# Patient Record
Sex: Female | Born: 1955 | Race: White | Hispanic: No | Marital: Married | Smoking: Former smoker
Health system: Southern US, Community
[De-identification: ages and names within clinical notes are randomized; demographics above are authoritative.]

## PROBLEM LIST (undated history)

## (undated) DIAGNOSIS — J4520 Mild intermittent asthma, uncomplicated: Secondary | ICD-10-CM

## (undated) DIAGNOSIS — Z973 Presence of spectacles and contact lenses: Secondary | ICD-10-CM

## (undated) DIAGNOSIS — E785 Hyperlipidemia, unspecified: Secondary | ICD-10-CM

## (undated) DIAGNOSIS — Z8719 Personal history of other diseases of the digestive system: Secondary | ICD-10-CM

## (undated) DIAGNOSIS — R350 Frequency of micturition: Secondary | ICD-10-CM

## (undated) DIAGNOSIS — M436 Torticollis: Secondary | ICD-10-CM

## (undated) DIAGNOSIS — K5730 Diverticulosis of large intestine without perforation or abscess without bleeding: Secondary | ICD-10-CM

## (undated) DIAGNOSIS — Z8673 Personal history of transient ischemic attack (TIA), and cerebral infarction without residual deficits: Secondary | ICD-10-CM

## (undated) DIAGNOSIS — Z98811 Dental restoration status: Secondary | ICD-10-CM

## (undated) DIAGNOSIS — M2011 Hallux valgus (acquired), right foot: Secondary | ICD-10-CM

## (undated) DIAGNOSIS — N950 Postmenopausal bleeding: Secondary | ICD-10-CM

## (undated) DIAGNOSIS — C50919 Malignant neoplasm of unspecified site of unspecified female breast: Secondary | ICD-10-CM

## (undated) DIAGNOSIS — M1990 Unspecified osteoarthritis, unspecified site: Secondary | ICD-10-CM

## (undated) DIAGNOSIS — R7303 Prediabetes: Secondary | ICD-10-CM

## (undated) DIAGNOSIS — M47812 Spondylosis without myelopathy or radiculopathy, cervical region: Secondary | ICD-10-CM

## (undated) DIAGNOSIS — R9389 Abnormal findings on diagnostic imaging of other specified body structures: Secondary | ICD-10-CM

## (undated) DIAGNOSIS — Z87898 Personal history of other specified conditions: Secondary | ICD-10-CM

## (undated) DIAGNOSIS — R0609 Other forms of dyspnea: Secondary | ICD-10-CM

## (undated) DIAGNOSIS — Z923 Personal history of irradiation: Secondary | ICD-10-CM

## (undated) DIAGNOSIS — Z8616 Personal history of COVID-19: Secondary | ICD-10-CM

## (undated) DIAGNOSIS — M2560 Stiffness of unspecified joint, not elsewhere classified: Secondary | ICD-10-CM

## (undated) DIAGNOSIS — J45909 Unspecified asthma, uncomplicated: Secondary | ICD-10-CM

## (undated) DIAGNOSIS — I10 Essential (primary) hypertension: Secondary | ICD-10-CM

## (undated) DIAGNOSIS — N882 Stricture and stenosis of cervix uteri: Secondary | ICD-10-CM

## (undated) DIAGNOSIS — R739 Hyperglycemia, unspecified: Secondary | ICD-10-CM

## (undated) DIAGNOSIS — M26629 Arthralgia of temporomandibular joint, unspecified side: Secondary | ICD-10-CM

## (undated) DIAGNOSIS — T7840XA Allergy, unspecified, initial encounter: Secondary | ICD-10-CM

## (undated) DIAGNOSIS — K760 Fatty (change of) liver, not elsewhere classified: Secondary | ICD-10-CM

## (undated) DIAGNOSIS — R911 Solitary pulmonary nodule: Secondary | ICD-10-CM

## (undated) DIAGNOSIS — R1013 Epigastric pain: Secondary | ICD-10-CM

## (undated) DIAGNOSIS — N393 Stress incontinence (female) (male): Secondary | ICD-10-CM

## (undated) HISTORY — DX: Malignant neoplasm of unspecified site of unspecified female breast: C50.919

## (undated) HISTORY — DX: Mild intermittent asthma, uncomplicated: J45.20

## (undated) HISTORY — PX: GANGLION CYST EXCISION: SHX1691

## (undated) HISTORY — DX: Morbid (severe) obesity due to excess calories: E66.01

## (undated) HISTORY — DX: Personal history of transient ischemic attack (TIA), and cerebral infarction without residual deficits: Z86.73

## (undated) HISTORY — DX: Allergy, unspecified, initial encounter: T78.40XA

## (undated) HISTORY — DX: Hyperglycemia, unspecified: R73.9

## (undated) HISTORY — DX: Epigastric pain: R10.13

## (undated) HISTORY — DX: Hyperlipidemia, unspecified: E78.5

## (undated) HISTORY — DX: Essential (primary) hypertension: I10

---
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
## 1977-08-05 HISTORY — PX: APPENDECTOMY: SHX54

## 1979-08-06 HISTORY — PX: NASAL SEPTUM SURGERY: SHX37

## 1986-08-05 HISTORY — PX: BREAST ENHANCEMENT SURGERY: SHX7

## 1997-08-05 HISTORY — PX: TUBAL LIGATION: SHX77

## 1998-09-08 ENCOUNTER — Other Ambulatory Visit: Admission: RE | Admit: 1998-09-08 | Discharge: 1998-09-08 | Payer: Self-pay | Admitting: *Deleted

## 1999-06-15 ENCOUNTER — Ambulatory Visit (HOSPITAL_COMMUNITY): Admission: RE | Admit: 1999-06-15 | Discharge: 1999-06-15 | Payer: Self-pay | Admitting: *Deleted

## 1999-07-06 ENCOUNTER — Other Ambulatory Visit: Admission: RE | Admit: 1999-07-06 | Discharge: 1999-07-06 | Payer: Self-pay | Admitting: Orthopedic Surgery

## 1999-07-15 ENCOUNTER — Inpatient Hospital Stay (HOSPITAL_COMMUNITY): Admission: AD | Admit: 1999-07-15 | Discharge: 1999-07-15 | Payer: Self-pay | Admitting: Obstetrics & Gynecology

## 2001-12-01 ENCOUNTER — Ambulatory Visit (HOSPITAL_COMMUNITY): Admission: RE | Admit: 2001-12-01 | Discharge: 2001-12-01 | Payer: Self-pay | Admitting: Family Medicine

## 2001-12-01 ENCOUNTER — Encounter: Payer: Self-pay | Admitting: Family Medicine

## 2002-08-03 ENCOUNTER — Encounter: Admission: RE | Admit: 2002-08-03 | Discharge: 2002-08-03 | Payer: Self-pay | Admitting: *Deleted

## 2002-08-04 ENCOUNTER — Other Ambulatory Visit: Admission: RE | Admit: 2002-08-04 | Discharge: 2002-08-04 | Payer: Self-pay | Admitting: *Deleted

## 2002-08-05 DIAGNOSIS — Z8673 Personal history of transient ischemic attack (TIA), and cerebral infarction without residual deficits: Secondary | ICD-10-CM

## 2002-08-05 HISTORY — DX: Personal history of transient ischemic attack (TIA), and cerebral infarction without residual deficits: Z86.73

## 2003-06-21 ENCOUNTER — Encounter (INDEPENDENT_AMBULATORY_CARE_PROVIDER_SITE_OTHER): Payer: Self-pay | Admitting: Cardiology

## 2003-06-21 ENCOUNTER — Observation Stay (HOSPITAL_COMMUNITY): Admission: AD | Admit: 2003-06-21 | Discharge: 2003-06-22 | Payer: Self-pay | Admitting: Family Medicine

## 2003-08-01 ENCOUNTER — Encounter: Admission: RE | Admit: 2003-08-01 | Discharge: 2003-08-01 | Payer: Self-pay | Admitting: *Deleted

## 2003-08-25 ENCOUNTER — Encounter: Admission: RE | Admit: 2003-08-25 | Discharge: 2003-08-25 | Payer: Self-pay | Admitting: Family Medicine

## 2003-10-21 ENCOUNTER — Ambulatory Visit (HOSPITAL_COMMUNITY): Admission: RE | Admit: 2003-10-21 | Discharge: 2003-10-21 | Payer: Self-pay | Admitting: Critical Care Medicine

## 2004-06-26 ENCOUNTER — Ambulatory Visit: Payer: Self-pay | Admitting: Family Medicine

## 2004-07-09 ENCOUNTER — Ambulatory Visit: Payer: Self-pay | Admitting: Family Medicine

## 2004-07-13 ENCOUNTER — Ambulatory Visit: Payer: Self-pay

## 2004-07-20 ENCOUNTER — Ambulatory Visit: Payer: Self-pay | Admitting: Family Medicine

## 2004-08-08 ENCOUNTER — Ambulatory Visit: Payer: Self-pay

## 2004-08-15 ENCOUNTER — Ambulatory Visit: Payer: Self-pay | Admitting: Family Medicine

## 2004-08-28 ENCOUNTER — Ambulatory Visit: Payer: Self-pay | Admitting: Family Medicine

## 2004-11-02 ENCOUNTER — Ambulatory Visit: Payer: Self-pay | Admitting: Family Medicine

## 2005-12-16 ENCOUNTER — Ambulatory Visit: Payer: Self-pay | Admitting: Family Medicine

## 2005-12-20 ENCOUNTER — Encounter: Admission: RE | Admit: 2005-12-20 | Discharge: 2005-12-20 | Payer: Self-pay | Admitting: Family Medicine

## 2006-04-09 ENCOUNTER — Ambulatory Visit: Payer: Self-pay | Admitting: Internal Medicine

## 2006-07-30 ENCOUNTER — Ambulatory Visit: Payer: Self-pay | Admitting: Internal Medicine

## 2006-12-14 ENCOUNTER — Encounter: Admission: RE | Admit: 2006-12-14 | Discharge: 2006-12-14 | Payer: Self-pay | Admitting: Radiology

## 2007-07-17 ENCOUNTER — Ambulatory Visit (HOSPITAL_BASED_OUTPATIENT_CLINIC_OR_DEPARTMENT_OTHER): Admission: RE | Admit: 2007-07-17 | Discharge: 2007-07-17 | Payer: Self-pay | Admitting: Orthopedic Surgery

## 2007-07-17 HISTORY — PX: BUNIONECTOMY: SHX129

## 2007-09-24 ENCOUNTER — Encounter: Payer: Self-pay | Admitting: Internal Medicine

## 2007-10-21 ENCOUNTER — Ambulatory Visit (HOSPITAL_COMMUNITY): Admission: RE | Admit: 2007-10-21 | Discharge: 2007-10-21 | Payer: Self-pay | Admitting: Urology

## 2007-10-21 ENCOUNTER — Encounter (INDEPENDENT_AMBULATORY_CARE_PROVIDER_SITE_OTHER): Payer: Self-pay | Admitting: Plastic Surgery

## 2007-10-21 ENCOUNTER — Ambulatory Visit: Payer: Self-pay | Admitting: Surgery

## 2007-11-26 ENCOUNTER — Ambulatory Visit: Payer: Self-pay | Admitting: Internal Medicine

## 2007-11-26 DIAGNOSIS — Z978 Presence of other specified devices: Secondary | ICD-10-CM | POA: Insufficient documentation

## 2007-11-26 DIAGNOSIS — R739 Hyperglycemia, unspecified: Secondary | ICD-10-CM | POA: Insufficient documentation

## 2007-11-26 DIAGNOSIS — J4520 Mild intermittent asthma, uncomplicated: Secondary | ICD-10-CM

## 2007-11-26 DIAGNOSIS — F411 Generalized anxiety disorder: Secondary | ICD-10-CM | POA: Insufficient documentation

## 2007-11-26 HISTORY — DX: Hyperglycemia, unspecified: R73.9

## 2007-11-26 HISTORY — DX: Mild intermittent asthma, uncomplicated: J45.20

## 2007-11-27 ENCOUNTER — Encounter (INDEPENDENT_AMBULATORY_CARE_PROVIDER_SITE_OTHER): Payer: Self-pay | Admitting: *Deleted

## 2007-12-04 LAB — CONVERTED CEMR LAB
AST: 16 units/L (ref 0–37)
Albumin: 4 g/dL (ref 3.5–5.2)
Alkaline Phosphatase: 80 units/L (ref 39–117)
BUN: 20 mg/dL (ref 6–23)
Basophils Relative: 0.1 % (ref 0.0–1.0)
Chloride: 108 meq/L (ref 96–112)
Eosinophils Relative: 3.3 % (ref 0.0–5.0)
Glucose, Bld: 98 mg/dL (ref 70–99)
HCT: 39.9 % (ref 36.0–46.0)
Monocytes Absolute: 0.4 10*3/uL (ref 0.1–1.0)
Monocytes Relative: 5.8 % (ref 3.0–12.0)
Platelets: 246 10*3/uL (ref 150–400)
Potassium: 4 meq/L (ref 3.5–5.1)
RBC: 4.47 M/uL (ref 3.87–5.11)
Total CHOL/HDL Ratio: 6.9
Total Protein: 7 g/dL (ref 6.0–8.3)
Triglycerides: 133 mg/dL (ref 0–149)
WBC: 6.1 10*3/uL (ref 4.5–10.5)

## 2007-12-23 ENCOUNTER — Ambulatory Visit: Payer: Self-pay | Admitting: Critical Care Medicine

## 2007-12-23 ENCOUNTER — Encounter: Payer: Self-pay | Admitting: Internal Medicine

## 2007-12-29 ENCOUNTER — Ambulatory Visit: Payer: Self-pay | Admitting: Internal Medicine

## 2007-12-29 ENCOUNTER — Encounter (INDEPENDENT_AMBULATORY_CARE_PROVIDER_SITE_OTHER): Payer: Self-pay | Admitting: *Deleted

## 2007-12-29 DIAGNOSIS — E785 Hyperlipidemia, unspecified: Secondary | ICD-10-CM | POA: Insufficient documentation

## 2007-12-30 ENCOUNTER — Encounter (INDEPENDENT_AMBULATORY_CARE_PROVIDER_SITE_OTHER): Payer: Self-pay | Admitting: *Deleted

## 2008-02-02 ENCOUNTER — Encounter: Payer: Self-pay | Admitting: Internal Medicine

## 2008-02-02 ENCOUNTER — Encounter: Admission: RE | Admit: 2008-02-02 | Discharge: 2008-04-26 | Payer: Self-pay | Admitting: Internal Medicine

## 2008-03-05 LAB — CONVERTED CEMR LAB

## 2008-06-07 ENCOUNTER — Ambulatory Visit (HOSPITAL_BASED_OUTPATIENT_CLINIC_OR_DEPARTMENT_OTHER): Admission: RE | Admit: 2008-06-07 | Discharge: 2008-06-07 | Payer: Self-pay | Admitting: Orthopedic Surgery

## 2008-06-07 HISTORY — PX: SHOULDER ARTHROSCOPY: SHX128

## 2008-06-07 HISTORY — PX: SHOULDER ARTHROSCOPY W/ LABRAL REPAIR: SHX2399

## 2008-06-16 ENCOUNTER — Ambulatory Visit: Payer: Self-pay | Admitting: Internal Medicine

## 2008-06-16 DIAGNOSIS — I10 Essential (primary) hypertension: Secondary | ICD-10-CM | POA: Insufficient documentation

## 2008-06-16 HISTORY — DX: Essential (primary) hypertension: I10

## 2008-06-16 LAB — CONVERTED CEMR LAB
AST: 17 units/L (ref 0–37)
Basophils Absolute: 0 10*3/uL (ref 0.0–0.1)
Bilirubin Urine: NEGATIVE
Calcium: 10.1 mg/dL (ref 8.4–10.5)
Chloride: 101 meq/L (ref 96–112)
Cholesterol: 282 mg/dL (ref 0–200)
Creatinine, Ser: 0.9 mg/dL (ref 0.4–1.2)
Crystals: NEGATIVE
Direct LDL: 194.3 mg/dL
GFR calc Af Amer: 85 mL/min
GFR calc non Af Amer: 70 mL/min
HDL: 39.3 mg/dL (ref >39.0)
Hgb A1c MFr Bld: 5.7 % (ref 4.6–6.0)
Leukocytes, UA: NEGATIVE
Lymphocytes Relative: 20.4 % (ref 12.0–46.0)
MCHC: 34.6 g/dL (ref 30.0–36.0)
Mucus, UA: NEGATIVE
Neutro Abs: 5.2 10*3/uL (ref 1.4–7.7)
Neutrophils Relative %: 72.5 % (ref 43.0–77.0)
Nitrite: NEGATIVE
RDW: 12.6 % (ref 11.5–14.6)
Specific Gravity, Urine: <=1.005 (ref 1.000–1.03)
TSH: 2.26 microintl units/mL (ref 0.35–5.50)
Total Bilirubin: 0.9 mg/dL (ref 0.3–1.2)
Triglycerides: 151 mg/dL — ABNORMAL HIGH (ref 0–149)
VLDL: 30 mg/dL (ref 0–40)
pH: 5.5 (ref 5.0–8.0)

## 2008-06-23 ENCOUNTER — Ambulatory Visit: Payer: Self-pay | Admitting: Internal Medicine

## 2008-06-23 DIAGNOSIS — E8881 Metabolic syndrome: Secondary | ICD-10-CM | POA: Insufficient documentation

## 2008-06-23 LAB — CONVERTED CEMR LAB
Cholesterol, target level: 200 mg/dL
LDL Goal: 70 mg/dL

## 2008-07-20 ENCOUNTER — Ambulatory Visit: Payer: Self-pay | Admitting: Internal Medicine

## 2008-08-01 ENCOUNTER — Ambulatory Visit: Payer: Self-pay | Admitting: Internal Medicine

## 2008-08-01 ENCOUNTER — Telehealth (INDEPENDENT_AMBULATORY_CARE_PROVIDER_SITE_OTHER): Payer: Self-pay | Admitting: *Deleted

## 2008-08-14 ENCOUNTER — Telehealth: Payer: Self-pay | Admitting: Family Medicine

## 2008-08-15 ENCOUNTER — Telehealth: Payer: Self-pay | Admitting: Internal Medicine

## 2008-08-18 ENCOUNTER — Other Ambulatory Visit: Payer: Self-pay | Admitting: Orthopedic Surgery

## 2008-08-18 HISTORY — PX: ACROMIONECTOMY: SHX1124

## 2008-08-18 HISTORY — PX: ROTATOR CUFF REPAIR W/ DISTAL CLAVICLE EXCISION: SHX2365

## 2008-08-19 ENCOUNTER — Inpatient Hospital Stay (HOSPITAL_COMMUNITY): Admission: AD | Admit: 2008-08-19 | Discharge: 2008-08-20 | Payer: Self-pay | Admitting: Orthopedic Surgery

## 2008-10-18 ENCOUNTER — Ambulatory Visit: Payer: Self-pay | Admitting: Internal Medicine

## 2008-11-16 ENCOUNTER — Ambulatory Visit: Payer: Self-pay | Admitting: Internal Medicine

## 2009-06-14 ENCOUNTER — Ambulatory Visit: Payer: Self-pay | Admitting: Internal Medicine

## 2009-06-14 DIAGNOSIS — K219 Gastro-esophageal reflux disease without esophagitis: Secondary | ICD-10-CM | POA: Insufficient documentation

## 2009-06-14 LAB — CONVERTED CEMR LAB
ALT: 34 units/L (ref 0–35)
AST: 25 units/L (ref 0–37)
BUN: 15 mg/dL (ref 6–23)
Basophils Absolute: 0.1 10*3/uL (ref 0.0–0.1)
Bilirubin, Direct: 0.1 mg/dL (ref 0.0–0.3)
CK-MB: 1.1 ng/mL (ref 0.3–4.0)
Creatinine, Ser: 0.7 mg/dL (ref 0.4–1.2)
Eosinophils Relative: 2.6 % (ref 0.0–5.0)
GFR calc non Af Amer: 93.73 mL/min (ref >60)
Hemoglobin, Urine: NEGATIVE
LH: 25.49 milliintl units/mL
Monocytes Absolute: 0.4 10*3/uL (ref 0.1–1.0)
Monocytes Relative: 6.4 % (ref 3.0–12.0)
Neutrophils Relative %: 64.9 % (ref 43.0–77.0)
Nitrite: NEGATIVE
Platelets: 203 10*3/uL (ref 150.0–400.0)
RDW: 12.8 % (ref 11.5–14.6)
Specific Gravity, Urine: <=1.005 (ref 1.000–1.030)
Total Bilirubin: 0.7 mg/dL (ref 0.3–1.2)
Urine Glucose: NEGATIVE mg/dL
Urobilinogen, UA: 0.2 (ref 0.0–1.0)
WBC: 5.9 10*3/uL (ref 4.5–10.5)

## 2009-06-15 ENCOUNTER — Encounter: Payer: Self-pay | Admitting: Internal Medicine

## 2009-07-04 ENCOUNTER — Ambulatory Visit: Payer: Self-pay

## 2009-07-04 ENCOUNTER — Ambulatory Visit: Payer: Self-pay | Admitting: Cardiology

## 2010-01-23 ENCOUNTER — Encounter: Payer: Self-pay | Admitting: Internal Medicine

## 2010-04-30 ENCOUNTER — Ambulatory Visit: Payer: Self-pay | Admitting: Internal Medicine

## 2010-04-30 DIAGNOSIS — R1084 Generalized abdominal pain: Secondary | ICD-10-CM | POA: Insufficient documentation

## 2010-04-30 DIAGNOSIS — A09 Infectious gastroenteritis and colitis, unspecified: Secondary | ICD-10-CM | POA: Insufficient documentation

## 2010-04-30 LAB — CONVERTED CEMR LAB
ALT: 32 units/L (ref 0–35)
AST: 25 units/L (ref 0–37)
Alkaline Phosphatase: 88 units/L (ref 39–117)
BUN: 17 mg/dL (ref 6–23)
Basophils Absolute: 0.1 10*3/uL (ref 0.0–0.1)
Bilirubin, Direct: 0.1 mg/dL (ref 0.0–0.3)
Calcium: 9.7 mg/dL (ref 8.4–10.5)
Creatinine, Ser: 0.8 mg/dL (ref 0.4–1.2)
Eosinophils Relative: 4.3 % (ref 0.0–5.0)
GFR calc non Af Amer: 78.93 mL/min (ref >60)
Glucose, Bld: 95 mg/dL (ref 70–99)
HCT: 37.8 % (ref 36.0–46.0)
Lymphocytes Relative: 24.2 % (ref 12.0–46.0)
Lymphs Abs: 1.6 10*3/uL (ref 0.7–4.0)
Monocytes Relative: 6.2 % (ref 3.0–12.0)
Neutrophils Relative %: 64.2 % (ref 43.0–77.0)
Nitrite: NEGATIVE
Platelets: 228 10*3/uL (ref 150.0–400.0)
Potassium: 3.9 meq/L (ref 3.5–5.1)
RDW: 14 % (ref 11.5–14.6)
Specific Gravity, Urine: >=1.03 (ref 1.000–1.030)
TSH: 1.42 microintl units/mL (ref 0.35–5.50)
Total Bilirubin: 0.4 mg/dL (ref 0.3–1.2)
Total Protein, Urine: NEGATIVE mg/dL
Urine Glucose: NEGATIVE mg/dL
Urobilinogen, UA: 0.2 (ref 0.0–1.0)
WBC: 6.4 10*3/uL (ref 4.5–10.5)

## 2010-08-25 ENCOUNTER — Encounter: Payer: Self-pay | Admitting: Family Medicine

## 2010-08-26 ENCOUNTER — Encounter: Payer: Self-pay | Admitting: Obstetrics and Gynecology

## 2010-09-04 NOTE — Assessment & Plan Note (Signed)
Summary: X 2 WKS OFF AND ON-L SHOULDER & BACK PAIN-NAUSEA-VOMITING-STC   Vital Signs:  Patient profile:   55 year old female Height:      63 inches Weight:      237 pounds BMI:     42.13 O2 Sat:      97 % on Room air Temp:     98.2 degrees F oral Pulse rate:   84 / minute Pulse rhythm:   regular BP sitting:   120 / 80  (left arm) Cuff size:   large  Vitals Entered By: Rock Nephew CMA (April 30, 2010 2:02 PM)  O2 Flow:  Room air CC: Pt c/lo nausea, vomiting, diarrhea, abdominal pain x 2wks, Diarrhea Is Patient Diabetic? Yes Did you bring your meter with you today? No Pain Assessment Patient in pain? yes     Location: abdomen  Does patient need assistance? Functional Status Self care Ambulation Normal   Primary Care Provider:  Etta Grandchild MD  CC:  Pt c/lo nausea, vomiting, diarrhea, abdominal pain x 2wks, and Diarrhea.  History of Present Illness:  Diarrhea      This is a 55 year old woman who presents with Diarrhea.  The symptoms began 2 weeks ago.  The severity is described as moderate.  The patient reports 4-6 stools per day, watery/unformed stools, fasting diarrhea, and abrupt onset of symptoms, but denies voluminous stools, blood in stool, mucus in stool, greasy stools, malodorous stools, fecal urgency, fecal soiling, alternating diarrhea/constipation, nocturnal diarrhea, bloating, gassiness, and gradual onset of symptoms.  Associated symptoms include abdominal pain, abdominal cramps, nausea, and vomiting.  The patient denies fever, lightheadedness, increased thirst, weight loss, joint pains, mouth ulcers, and eye redness.  The symptoms are worse with any food.  The symptoms are better with hypomotility agents.  Patient's risk factors for diarrhea include sick contact.    Preventive Screening-Counseling & Management  Alcohol-Tobacco     Alcohol drinks/day: 0     Alcohol Counseling: not indicated; patient does not drink     Smoking Status: quit > 6 months     Year Quit: 07/2004     Passive Smoke Exposure: no     Tobacco Counseling: to remain off tobacco products  Hep-HIV-STD-Contraception     Hepatitis Risk: no risk noted     HIV Risk: no risk noted     STD Risk: no risk noted      Sexual History:  currently monogamous.        Drug Use:  never.        Blood Transfusions:  no.    Medications Prior to Update: 1)  Zetia 10 Mg Tabs (Ezetimibe) .... Once Daily Prn 2)  Aciphex 20 Mg Tbec (Rabeprazole Sodium) .... Once Daily For Heartburn  Current Medications (verified): 1)  Aciphex 20 Mg Tbec (Rabeprazole Sodium) .... Once Daily For Heartburn 2)  Metronidazole 500 Mg Tabs (Metronidazole) .... One By Mouth Three Times A Day For 10 Days  Allergies (verified): 1)  ! Pcn 2)  ! Phenergan 3)  ! Levaquin 4)  ! Biaxin 5)  ! 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 echo.. 2004 Chest pain.... Myoview 2006.. no ischemia  /   standard treadmill.. July 04, 2009... limited tolerance... no ischemia  Past Surgical History: Last updated: 06/27/2009 Carpal tunnel release R Tubal ligation breast implants Rhinoplasty C-section  Appendectomy  Family History: Last updated: 11/26/2007 lung Ca - M DM - M, bro CAD - M, PGF (MI age 40)  Social History: Last updated: 06/16/2008 Married 1 child Never Smoked Alcohol use-no Drug use-no Regular exercise-yes  Risk Factors: Alcohol Use: 0 (04/30/2010) Exercise: yes (06/16/2008)  Risk Factors: Smoking Status: quit > 6 months (04/30/2010) Passive Smoke Exposure: no (04/30/2010)  Family History: Reviewed history from 11/26/2007 and no changes required. lung Ca - M DM - M, bro CAD - M, PGF (MI age 36)  Social History: Reviewed history from 06/16/2008 and no changes required. Married 1 child Never Smoked Alcohol  use-no Drug use-no Regular exercise-yes Smoking Status:  quit > 6 months  Review of Systems       The patient complains of anorexia and abdominal pain.  The patient denies fever, weight loss, weight gain, chest pain, syncope, dyspnea on exertion, peripheral edema, prolonged cough, headaches, hemoptysis, melena, hematochezia, severe indigestion/heartburn, hematuria, suspicious skin lesions, enlarged lymph nodes, and angioedema.   GI:  Complains of abdominal pain, change in bowel habits, diarrhea, loss of appetite, nausea, and vomiting; denies bloody stools, constipation, dark tarry stools, hemorrhoids, indigestion, vomiting blood, and yellowish skin color.  Physical Exam  General:  alert, well-developed, well-nourished, well-hydrated, appropriate dress, normal appearance, cooperative to examination, good hygiene, and overweight-appearing.   Head:  normocephalic and atraumatic.   Eyes:  no icterus Mouth:  Oral mucosa and oropharynx without lesions or exudates.  Teeth in good repair. Neck:  supple, full ROM, no masses, no thyromegaly, no JVD, no carotid bruits, no cervical lymphadenopathy, and no neck tenderness.   Lungs:  normal respiratory effort, no intercostal retractions, no accessory muscle use, normal breath sounds, and no dullness.   Heart:  normal rate, regular rhythm, no murmur, no gallop, and no rub.   Abdomen:  soft, non-tender, normal bowel sounds, no distention, no masses, no guarding, no rigidity, no rebound tenderness, no abdominal hernia, no inguinal hernia, no hepatomegaly, and no splenomegaly.   Msk:  No deformity or scoliosis noted of thoracic or lumbar spine.   Pulses:  R and L carotid,radial,femoral,dorsalis pedis and posterior tibial pulses are full and equal bilaterally Extremities:  No clubbing, cyanosis, edema, or deformity noted with normal full range of motion of all joints.   Neurologic:  No cranial nerve deficits noted. Station and gait are normal. Plantar reflexes  are down-going bilaterally. DTRs are symmetrical throughout. Sensory, motor and coordinative functions appear intact. Skin:  turgor normal, color normal, no rashes, no suspicious lesions, no ecchymoses, no petechiae, no purpura, no ulcerations, and no edema.   Cervical Nodes:  no anterior cervical adenopathy and no posterior cervical adenopathy.   Axillary Nodes:  no R axillary adenopathy and no L axillary adenopathy.   Psych:  Cognition and judgment appear intact. Alert and cooperative with normal attention span and concentration. No apparent delusions, illusions, hallucinations  Diabetes Management Exam:    Foot Exam (with socks and/or shoes not present):       Sensory-Pinprick/Light touch:          Left medial foot (L-4): normal          Left dorsal foot (L-5): normal          Left lateral foot (S-1): normal          Right medial foot (L-4): normal          Right dorsal foot (L-5): normal          Right lateral  foot (S-1): normal       Sensory-Monofilament:          Left foot: normal          Right foot: normal       Inspection:          Left foot: normal          Right foot: normal       Nails:          Left foot: normal          Right foot: normal   Impression & Recommendations:  Problem # 1:  INFECTIOUS DIARRHEA (ICD-009.2) Assessment New start flagyl, this sounds like C diff colitis Orders: Venipuncture (16109) TLB-BMP (Basic Metabolic Panel-BMET) (80048-METABOL) TLB-CBC Platelet - w/Differential (85025-CBCD) TLB-Hepatic/Liver Function Pnl (80076-HEPATIC) TLB-TSH (Thyroid Stimulating Hormone) (84443-TSH) TLB-Amylase (82150-AMYL) TLB-Lipase (83690-LIPASE) TLB-Udip w/ Micro (81001-URINE) TLB-CRP-High Sensitivity (C-Reactive Protein) (86140-FCRP) TLB-A1C / Hgb A1C (Glycohemoglobin) (83036-A1C) T-Culture, Stool (87045/87046-70140) T-Culture, C-Diff Toxin A/B (60454-09811) T-Stool Giardia / Crypto- EIA (91478) T-Stool for O&P (29562-13086)  Problem # 2:  ABDOMINAL  PAIN, GENERALIZED (ICD-789.07) Assessment: New  Orders: Venipuncture (57846) TLB-BMP (Basic Metabolic Panel-BMET) (80048-METABOL) TLB-CBC Platelet - w/Differential (85025-CBCD) TLB-Hepatic/Liver Function Pnl (80076-HEPATIC) TLB-TSH (Thyroid Stimulating Hormone) (84443-TSH) TLB-Amylase (82150-AMYL) TLB-Lipase (83690-LIPASE) TLB-Udip w/ Micro (81001-URINE) TLB-CRP-High Sensitivity (C-Reactive Protein) (86140-FCRP) TLB-A1C / Hgb A1C (Glycohemoglobin) (83036-A1C)  Problem # 3:  HYPERTENSION (ICD-401.9) Assessment: Improved  Orders: Venipuncture (96295) TLB-BMP (Basic Metabolic Panel-BMET) (80048-METABOL) TLB-CBC Platelet - w/Differential (85025-CBCD) TLB-Hepatic/Liver Function Pnl (80076-HEPATIC) TLB-TSH (Thyroid Stimulating Hormone) (84443-TSH) TLB-Amylase (82150-AMYL) TLB-Lipase (83690-LIPASE) TLB-Udip w/ Micro (81001-URINE) TLB-CRP-High Sensitivity (C-Reactive Protein) (86140-FCRP) TLB-A1C / Hgb A1C (Glycohemoglobin) (83036-A1C)  BP today: 120/80 Prior BP: 118/86 (07/04/2009)  Prior 10 Yr Risk Heart Disease: Not enough information (10/18/2008)  Labs Reviewed: K+: 4.1 (06/14/2009) Creat: : 0.7 (06/14/2009)   Chol: 282 (06/16/2008)   HDL: 39.3 (06/16/2008)   LDL: DEL (06/16/2008)   TG: 151 (06/16/2008)  Problem # 4:  DIABETES MELLITUS, TYPE II (ICD-250.00) Assessment: Unchanged  Orders: Venipuncture (28413) TLB-BMP (Basic Metabolic Panel-BMET) (80048-METABOL) TLB-CBC Platelet - w/Differential (85025-CBCD) TLB-Hepatic/Liver Function Pnl (80076-HEPATIC) TLB-TSH (Thyroid Stimulating Hormone) (84443-TSH) TLB-Amylase (82150-AMYL) TLB-Lipase (83690-LIPASE) TLB-Udip w/ Micro (81001-URINE) TLB-CRP-High Sensitivity (C-Reactive Protein) (86140-FCRP) TLB-A1C / Hgb A1C (Glycohemoglobin) (83036-A1C)  Labs Reviewed: Creat: 0.7 (06/14/2009)     Last Eye Exam: normal (04/11/2008) Reviewed HgBA1c results: 5.8 (06/14/2009)  5.7 (06/16/2008)  Complete Medication List: 1)   Aciphex 20 Mg Tbec (Rabeprazole sodium) .... Once daily for heartburn 2)  Metronidazole 500 Mg Tabs (Metronidazole) .... One by mouth three times a day for 10 days  Patient Instructions: 1)  Please schedule a follow-up appointment in 2 weeks. 2)  Take your antibiotic as prescribed until ALL of it is gone, but stop if you develop a rash or swelling and contact our office as soon as possible. 3)  teh main problem with gastroenteritis is dehydration. Drink plenty of fluids and take solids as you feel better. If you are unable to keep anything down and/or you show signs of dehydration(dry/cracked lips, lack of tears, not urinating, very sleepy), call our office. Prescriptions: METRONIDAZOLE 500 MG TABS (METRONIDAZOLE) One by mouth three times a day for 10 days  #30 x 0   Entered and Authorized by:   Etta Grandchild MD   Signed by:   Etta Grandchild MD on 04/30/2010   Method used:   Electronically to        [facility]. [phone]* (retail)  [address]       [place], [place]  [address]       Ph: [phone]       Fax: [phone]   RxID:   [id number] ACIPHEX 20 MG TBEC (RABEPRAZOLE SODIUM) once daily for heartburn  #30 x 11   Entered by:   Rock Nephew CMA   Authorized by:   Etta Grandchild MD   Signed by:   Etta Grandchild MD on 04/30/2010   Method used:   Electronically to        [facility]. [phone]* (retail)       [phone]       [place], [place]  [address]       Ph: [phone]       Fax: [phone]   RxID:   [id number]

## 2010-09-04 NOTE — Letter (Signed)
Summary: [facility]  [facility]   Imported By: Sherian Rein 01/24/2010 15:03:52  _____________________________________________________________________  External Attachment:    Type:   Image     Comment:   External Document

## 2010-11-19 LAB — GLUCOSE, CAPILLARY: Glucose-Capillary: 142 mg/dL — ABNORMAL HIGH (ref 70–99)

## 2010-11-19 LAB — POCT I-STAT 4, (NA,K, GLUC, HGB,HCT)
HCT: 44 % (ref 36.0–46.0)
Hemoglobin: 15 g/dL (ref 12.0–15.0)
Potassium: 4 mEq/L (ref 3.5–5.1)
Sodium: 139 mEq/L (ref 135–145)

## 2010-12-18 NOTE — Op Note (Signed)
[name], [name]                  ACCOUNT NO.:  [id number]   MEDICAL RECORD NO.:  [id number]          PATIENT TYPE:  AMB   LOCATION:  [facility]                         FACILITY:  [facility]   PHYSICIAN:  Marlowe Kays, M.D.  DATE OF BIRTH:  14-Feb-1956   DATE OF PROCEDURE:  08/18/2008  DATE OF DISCHARGE:                               OPERATIVE REPORT   PREOPERATIVE DIAGNOSIS:  Torn rotator cuff, left shoulder.   POSTOPERATIVE DIAGNOSIS:  Torn rotator cuff, left shoulder.   OPERATION:  Anterior acromionectomy and distal clavicle resection and  repair of torn rotator cuff, left shoulder.   SURGEON:  Marlowe Kays, M.D.   ASSISTANTDruscilla Brownie. Cherlynn June.   ANESTHESIA:  General preceded by interscalene block.   PATHOLOGY AND JUSTIFICATION FOR PROCEDURE:  She had had a prior  arthroscopic decompression on June 13, 2008 and was doing well until  December 26 when she fell due to hypotension from overmedication for  blood pressure.  She injured her left shoulder and I saw her on December  29 in severe pain.  She had an MRI performed on January 5 which  demonstrated a tear in the musculotendinous interval of the  supraspinatus measuring 1.6 x 2.1 cm in size.  Because of her severe  pain, she is here today for the above-mentioned surgery.   PROCEDURE:  Prophylactic antibiotics, preliminary interscalene block,  satisfactory general anesthesia, placed on the Schlein frame.  The left  shoulder girdle was prepped with DuraPrep, draped in sterile field.  Ioban employed.  I made a vertical incision centered at the distal  acromion.  Incision was carried down to the acromion and the fascia  overlying the acromion dissected off anteriorly and posteriorly in line  with the skin incision with cutting cautery.  To gain access I performed  an initial anterior acromionectomy placing small Cobb elevator followed  by a larger Cobb elevator beneath the anterior acromion, using a micro  saw,  substantial decompression was performed.  Joint fluid came forth  confirming that we did have a full-thickness rotator cuff tear.  She had  some adhesions between the rotator cuff and the underlying acromion with  some difficulty and removing additional acromion.  I finally found the  tear which was as depicted on the MRI which was well beneath the distal  clavicle.  Accordingly, in order to obtain access, I then extended my  skin incision more towards the neck and with subperiosteal dissection,  dissected the distal clavicle and removed the distal 1.5 cm protecting  the underneath structures.  Bone wax was placed over the raw end of the  clavicle.  Even with this bone resection, exposure was difficult.  I  then identified the tear and freed up the flaps of the side-to-side  portions of the tear which had extended to either side and then began  placing multiple interrupted #1 Ethilon sutures.  The tear did measure  over 2 cm as depicted on the MRI.  At the conclusion of this repair, I  then injected 10 mL of saline and methylene blue mixed together  and  found that the tear had been repaired with no leakage other than some  very minimal around one of the suture passes through the posterior  tendon.  Also there was no other tear noted.  Accordingly I felt we had  completed the procedure.  The wound was irrigated well with sterile  saline.  Gelfoam was placed in the distal clavicle resection site and  then I then repaired the fascia over the distal clavicle remnant as well  as a remnant of the anterior acromion and a small defect in the deltoid  muscle  with interrupted #1 Vicryl.  Subcu tissue was closed with combination of  #1  and 2-0 Vicryl, Steri-Strips on the skin.  Dry sterile dressing and  shoulder immobilizer applied.  She tolerated the procedure well and at  the time of this dictation was on her way to recovery room in  satisfactory condition with no known complications.            ______________________________  Marlowe Kays, M.D.     JA/MEDQ  D:  08/18/2008  T:  08/18/2008  Job:  [id number]

## 2010-12-18 NOTE — Op Note (Signed)
[name], [name]                  ACCOUNT NO.:  [id number]   MEDICAL RECORD NO.:  [id number]          PATIENT TYPE:  AMB   LOCATION:  [facility]                         [facility]AC[facility]   PHYSICIAN:  Marlowe Kays, M.D.  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   OPERATION:  Funk/Reverdin type bunionectomy left foot.   SURGEON:  Marlowe Kays, M.D.   ASSISTANT:  Nurse.   ANESTHESIA:  General.   INDICATIONS FOR PROCEDURE:  She actually has a large bunion deformity on  the right foot with a 19 degree first/second metatarsal angle but it is  not painful.  The one on her left foot is, a 14 degree first/second  metatarsal angle and I have recommended the above surgery to correct the  3 components of her deformity.   PROCEDURE IN DETAIL:  Under satisfactory general anesthesia pneumatic  tourniquet applied on left lower extremity Esmarch nonsterilely and  prepped from mid calf to toes with DuraPrep and draped in a sterile  field.  Time-out performed.  Marked out incision along the dorsal medial  aspect of the distal foot extending from the mid portion of the proximal  phalanx to about the mid distal third of the first metatarsal.  The  incision was carried down through the subcutaneous tissue.  Dorsal  cutaneous nerve was isolated and protected dorsally.  The bunion capsule  was identified and opened with the flap base distally to expose the  bunion deformity and the joint which looked healthy.  I made a small cut  at the base of the bunion with a hand osteotome and then distally at the  articular bunion surface made, I removed the bunion with a small  osteotome trimming out  remnants with a small rongeurs.  I then measured  from the distal most portion of the articular surface roughly 1.6 cm  proximally and on the raw cut bunion bone made a small mark with  cautery.  I then measured 6 mm distal to this and made a second cut.  On  the more proximal cut I used a micro saw protecting any surrounding  tissues cutting perpendicular to the bone leaving the lateral cortex  intact.  I then made an oblique cut from the distal mark removing the  small pie shaped segment of bone.  I then perforated the lateral cortex  with the small hand osteotome until I was able to close the osteotomy  with minimal force.  She actually had a nice tight closure but I did  place small elements of cancellous bone at the osteotomy site.  The  wound was irrigated with sterile saline and soft tissue was infiltrated  with 0.5% plain Marcaine with the great toe in a corrected position,  closing down the osteotomy.  I then repaired  the capsular flap with  proximal traction with multiple interrupted zero Vicryl.  I then closed  the skin and subcutaneous tissue as a unit with interrupted 4-0 nylon  mattress sutures.  Dry sterile dressing was applied with some supporting  dressing around the great toe in a corrected position.  I then placed a  well-padded sterile tongue blade along the great toe and medial  border of the foot.  I incorporated this in the dressing as well,  followed by plantar and dorsal plaster pancakes and additional dressing  on top of this.  The tourniquet was released.  She tolerated the  procedure well and was taken to the recovery room in satisfactory  condition with no known complications.           ______________________________  Marlowe Kays, M.D.     JA/MEDQ  D:  07/17/2007  T:  07/18/2007  Job:  [id number]

## 2010-12-18 NOTE — Op Note (Signed)
NAMESEATTLE, [name]                  ACCOUNT NO.:  [id number]   MEDICAL RECORD NO.:  [id number]          PATIENT TYPE:  AMB   LOCATION:  [facility]                         [facility]AC[facility]   PHYSICIAN:  Marlowe Kays, M.D.  DATE OF BIRTH:  [birth year]   DATE OF PROCEDURE:  06/07/2008  DATE OF DISCHARGE:                               OPERATIVE REPORT   PREOPERATIVE DIAGNOSES:  1. Impingement syndrome, left shoulder, with rotator cuff      tendinopathy.  2. Chronic lateral epicondylitis, left elbow.   POSTOPERATIVE DIAGNOSES:  1. Impingement syndrome, left shoulder, with rotator cuff      tendinopathy.  2. Chronic lateral epicondylitis, left elbow.   OPERATION:  1. Left shoulder arthroscopy with debridement of degenerated labrum,      subscapularis and underneath surface of the remainder of the      rotator cuff.  2. Arthroscopic subacromial decompression.  3. Injection of lateral left elbow with Marcaine and steroid.   PATHOLOGY AND JUSTIFICATION FOR PROCEDURE:  She has had chronic lateral  epicondylitis of the left elbow which has failed to respond to anti-  inflammatory medication and an elbow band, and requests steroid  injection today.  In addition, she has had chronic pain in the left  shoulder with an MRI demonstrating a partial subscapularis tear and a  type 2 acromion.  It was felt that she would benefit by arthroscopic  procedure shoulder in her shoulder as well.   PROCEDURE:  Under satisfactory general anesthesia, in a beach-chair  position on the sliding frame, the left shoulder girdle was prepped with  DuraPrep, draped in a sterile field.  Time-out performed.  Anatomy of  the shoulder joint was mapped out and placement of lateral and posterior  portals was made, and the subacromial space and the 2 portals were  infiltrated with 0.5% Marcaine with adrenaline.  Through a posterior  soft spot portal, I atraumatically entered the glenohumeral joint.  On  inspection she did  have partial tearing of the subscapularis tendon as  well as some shallow articular surface tears of the remainder the  rotator cuff.  In addition, she had a good bit of labral degeneration  around the biceps anchor.  Accordingly, I advanced the scope between the  subscapularis and biceps tendons and using a switching stick, made an  anterior incision over which I placed a metal cannula and then I  introduced a 4.2 shaver into the joint.  I then debrided down the  underneath surface of the rotator cuff and the subscapularis and labrum.  Pre and post films were taken.  I then directed the scope into the  subacromial space and through the lateral portal introduced a 4.2  shaver.  She had a large amount of bursal tissue, which I resected with  a 4.2 shaver, including 1 fascial band which was an impingement problem.  I then used the ArthroCare vaporizer to begin removing soft tissue from  the underneath surface of the acromion back to the [facility] joint and then  followed this with a 4-mm oval bur.  We then  went back and forth between  the bur and the vaporizer until we had wide decompression as evidenced  with documentary pictures with arm to her side and her arm abducted with  the vaporizer in place.  When we felt the decompression had been  completed, I removed all fluid possible and injected the 3 portals with  0.5% Marcaine with adrenaline and the subacromial space as well.  The  portals were then closed with 4-0 nylon.  Betadine, Adaptic and a dry,  sterile dressing were applied.  Also preceding the arthroscopic portion  of the case, I injected her area of point tenderness determined  preanesthetically with 1 mL of 0.5% Marcaine plain and 1 mL of steroid.  She tolerated the procedure well and was taken to the recovery ROM in  satisfactory condition with no known complications.           ______________________________  Marlowe Kays, M.D.     JA/MEDQ  D:  06/07/2008  T:  06/08/2008   Job:  [id number]

## 2010-12-21 NOTE — Discharge Summary (Signed)
[name], [name] NO.:  [id number]   MEDICAL RECORD NO.:  [id number]                   PATIENT TYPE:  INP   LOCATION:  4739                                 FACILITY:  [facility]   PHYSICIAN:  Loreen Freud, M.D.                  DATE OF BIRTH:  1955/12/21   DATE OF ADMISSION:  06/21/2003  DATE OF DISCHARGE:  06/22/2003                                 DISCHARGE SUMMARY   HISTORY:  [name] is a 55 year old female who presented with chest  discomfort and left hemiparesis while watching a baseball game. She actually  noted some blurry vision. She was admitted to 4700 by Dr. Loreen Freud.   LABORATORY DATA:  EKG showed normal sinus rhythm, nonspecific ST-T wave  changes.   CKs and troponins were negative for myocardial infarction. H&H was 14.8 and  43.1, normal indices. Platelets 218, WBC 8.6. PT 12.8, PTT 39, sodium 131,  potassium 4.0, BUN 11, creatinine 0.9, glucose 95, normal LFTs. It is noted  at the time of this dictation hemoglobin A1C and TSH is pending.   HOSPITAL COURSE:  [name] was admitted to 4700 by Dr. Laury Axon for evaluation  and cardiology consultation was sought with Dr. Dietrich Pates. Dr. Dietrich Pates felt  that her symptoms were suggestive of PSVT and quite symptomatic. She did not  experience syncope or near syncope and with a single episode he recommended  treated empirically. The patient will return to the ER for recurrent  symptoms, but no pursue extensive cardiac evaluation at this time.  Echocardiogram was ordered. Neurology consult was also performed who felt  that she should be treated for TIA prophylaxis with aspirin and Plavix.  Neurology noted that she has been undergoing strengthening exercises with  arm and shoulder weights. It was felt that her findings first off were CV  CNS event that could relate to central peripheral muscle spasm. No deficits  were noted. Dr. Dietrich Pates reviewed and felt that the patient could be  discharged home  from a cardiac standpoint and asked the patient to call for  recurring symptoms.   DISCHARGE DIAGNOSES:  1. Palpitations.  2. Left hemiparesis of uncertain etiology.  3. Tobacco use.   DISPOSITION:  [name] is discharged home. She is asked to continue Plavix  75 mg daily and coated aspirin 325 mg daily, and albuterol as previously.  She has been asked to discontinue smoking. Dr. Dietrich Pates gave her permission  to use a patch. She was asked to follow up with Dr. Ruthine Dose in approximately  two weeks for further evaluation.      Joellyn Rued, P.A. [facility]                    Loreen Freud, M.D.    EW/MEDQ  D:  06/22/2003  T:  06/23/2003  Job:  [id number]   cc:   Ruffin Frederick.  Ruthine Dose, M.D. [facility]

## 2010-12-21 NOTE — H&P (Signed)
NAMEKERIANN, RANKIN NO.:  [id number]   MEDICAL RECORD NO.:  [id number]                   PATIENT TYPE:  INP   LOCATION:  4739                                 FACILITY:  [facility]   PHYSICIAN:  Loreen Freud, M.D.                  DATE OF BIRTH:  1955/09/05   DATE OF ADMISSION:  06/21/2003  DATE OF DISCHARGE:  06/22/2003                                HISTORY & PHYSICAL   Patient is a 55 year old white female with complaints of chest pressure and  shortness of breath that occurred last night, June 20, 2003, while  watching a basketball game.  Patient had noticed left-sided weakness and  blurry vision.  She denies any slurred speech.  Symptoms continued this  morning, no worsening but not improving either.   The patient has a history of asthma and diabetes, although states diabetes  was an incorrect diagnosis apparently made while in prison.   PAST SURGICAL HISTORY:  1. Tubal ligation.  2. Right carpal tunnel.   ALLERGIES:  PENICILLIN causes her tongue to swell and PHENERGAN.   REGULAR MEDICATIONS:  Does take Allegra and uses Advair when needed.   SOCIAL HISTORY:  Patient is married with a daughter.  She smokes 10  cigarettes a day.  Works for [employer].  She denies any drugs or  alcohol.   FAMILY HISTORY:  Her father's history is unknown.  Mother has lung cancer,  diabetes, and coronary artery disease.  Paternal grandmother died at age 23  of old age.  Paternal grandfather died of a myocardial infarction in his  63s.  Maternal grandmother deceased from execution secondary to hiring  somebody to kill her husband, which means the maternal grandfather died from  gunshot wound.  She had one brother who was deceased at the age of 33 of  suicide.  He also had diabetes.  And patient states a lot of diabetes runs  in the family.   PHYSICAL EXAMINATION:  VITAL SIGNS:  Patient's weight was 201, pulse 92,  blood pressure 120/88, pulse ox was  97% on room air.  Patient was awake,  alert, and oriented in no acute distress on the exam table.  HEENT:  Head is normocephalic, atraumatic.  Eyes; pupils equal, round,  reactive to light.  Ears; tympanic membranes are intact.  Oropharynx is  clear.  NECK:  Supple.  No JVD.  No bruits.  HEART:  Positive S1 and S2.  No murmurs were appreciated.  LUNGS:  Clear bilaterally.  No rales, rhonchi, or wheezing.  ABDOMEN:  Soft and nontender.  No organomegaly.  NEURO:  Patient did have decreased strength on the left with the left arm  and leg.  Reflexes also decreased on the left.   EKG showed some ST-T wave changes in the septal leads.   ASSESSMENT/PLAN:  1. Patient is being admitted for chest pain  and left-sided weakness.  She     will be admitted to telemetry with a     neurology and cardiology consult.  Aspirin was given in the office.     Patient will be put on aspirin a day and Plavix.  Will do CPKs with     troponin.  A CT of the head will be ordered.  2. Questionable history of diabetes.  While patient is in the hospital will     have hemoglobin-A1c drawn.                                                Loreen Freud, M.D.    Nat Christen  D:  06/22/2003  T:  06/22/2003  Job:  [id number]

## 2010-12-21 NOTE — Consult Note (Signed)
[name], CLAIR NO.:  [id number]   MEDICAL RECORD NO.:  [id number]                   PATIENT TYPE:  INP   LOCATION:  4739                                 FACILITY:  [facility]   PHYSICIAN:  Wild Rose Bing, M.D.               DATE OF BIRTH:  12/13/1955   DATE OF CONSULTATION:  06/21/2003  DATE OF DISCHARGE:                                   CONSULTATION   REFERRING PHYSICIAN:  Angelena Sole, M.D.   HISTORY OF PRESENT ILLNESS:  A 55 year old woman without prior cardiac  history referred for evaluation of tachy palpitations.  [name] has never  previously been evaluated by a cardiologist nor have any significant cardiac  testing.  She generally enjoys excellent health.  While attending a  basketball game last night, she noted the sudden onset of palpitations,  dyspnea, and diaphoresis.  Despite these symptoms, she was able to walk from  the arena to her car.  She is a Astronomer., currently working in medicine, who  took her own pulse and registered it as 186.  She felt better when she  reached the car, but the palpitations persisted for 90 minutes.  There were  associated minor symptoms including numbness of the fingers of the left  hand, a sense of constricture on the left upper arm, and paresthesias of the  toes of her left foot.  Some of these symptoms have persisted well after the  resolution of her other symptoms.   PAST MEDICAL HISTORY:  1. TMJ syndrome, treated with conservative measures.  2. She has had questionable asthma.  3. There is a history of COPD and continued tobacco use.  4. She has been told of borderline diabetes, but has not required     pharmacologic therapy.  5. Likewise, her dyslipidemia.   PAST SURGICAL HISTORY:  1. Appendectomy.  2. Tonsillectomy.  3. Cesarean section.  4. Tubal ligation.  5. Rhinoplasty.   CURRENT MEDICATIONS:  1. Advair p.r.n.  2. Albuterol inhaler p.r.n.   SOCIAL HISTORY:  Lives in [place] with  her husband.  Currently runs an  Database administrator business.  A 45 pack year history of cigarette smoking.  Denies excessive alcohol use.   FAMILY HISTORY:  Notable for treated lung cancer in her mother.  There is no  family history of coronary disease.   REVIEW OF SYSTEMS:  A 70 pound weight gain over the past three years.  Nocturia once or twice per night.  All other systems negative.   PHYSICAL EXAMINATION:  GENERAL:  A very pleasant, well-nourished appearing,  somewhat overweight woman.  VITAL SIGNS:  The pulse is 80, respiratory rate 20, blood pressure 120/80,  temperature 98.  O2 saturation 97% on room air.  HEENT:  Anicteric sclerae.  NECK:  No jugular venous distention;  no carotid bruits.  HEMATOPOIETIC:  No adenopathy.  ENDOCRINE:  No thyromegaly.  SKIN:  No significant lesions.  CARDIAC:  Normal first and second heart sounds;  minimal systolic murmur.  LUNGS:  Clear.  ABDOMEN:  Soft and nontender;  no organomegaly;  normal aortic pulsation.  EXTREMITIES:  Normal distal pulses;  no edema.  NEUROMUSCULAR:  Symmetric strength and tone;  no cranial nerve  abnormalities.  MUSCULOSKELETAL:  Full range of motion in all joints.   LABORATORY DATA:  EKG:  Normal sinus rhythm;  within normal limits.  Additional initial laboratory is normal.   IMPRESSION AND RECOMMENDATIONS:  [name] developed symptoms highly  suggestive of paroxysmal supraventricular tachycardia last night.  The  persistent minor neurologic abnormalities are somewhat unusual, if this in  fact was the nature of her problem.  The absence of a concurrent EKG  recording, a definite diagnosis cannot be established.  Since she has only  had this one episode in her life, I am not inclined to start empiric therapy  nor to provide her with an event recorded for one month.  She is willing to  come to the emergency department or office immediately should symptoms  recur.  She has been cautioned against driving or other  dangerous activities  while symptomatic.  Her EKG shows no findings that would increase her  likelihood of paroxysmal supraventricular tachycardia such as pre-  excitation.  An echocardiogram is pending.  If normal, as expected, no  further testing is anticipated other than her remaining cardiac markers and  TSH.   We greatly appreciate this request of consultation and will be happy to  follow this nice woman after she leaves the hospital.                                                Bing, M.D.    RR/MEDQ  D:  06/21/2003  T:  06/21/2003  Job:  [id number]

## 2011-04-12 ENCOUNTER — Ambulatory Visit (INDEPENDENT_AMBULATORY_CARE_PROVIDER_SITE_OTHER): Payer: 59 | Admitting: Family

## 2011-04-12 DIAGNOSIS — J0141 Acute recurrent pansinusitis: Secondary | ICD-10-CM | POA: Insufficient documentation

## 2011-04-12 DIAGNOSIS — J019 Acute sinusitis, unspecified: Secondary | ICD-10-CM

## 2011-04-12 MED ORDER — DOXYCYCLINE HYCLATE 50 MG PO CAPS: 100.0000 mg | ORAL_CAPSULE | Freq: Two times a day (BID) | ORAL | Status: AC

## 2011-04-12 NOTE — Patient Instructions (Signed)
Call if symptoms worsen, if you have fever >101, or if you are not feeling better in 2-3 days.

## 2011-04-12 NOTE — Progress Notes (Signed)
  Subjective:    Patient ID: [name], female    DOB: 15-Nov-1955, 55 y.o.   MRN: [id number]  HPI  Ms.  [name] is a 55 year old female who presents today with chief complaint of chest congestion.  Notes associated nasal congestion, sinus pain, ear discomfort and sore throat.  Symptoms started 10 days ago.  She has tried multiple OTC remedies (Zicam, Alkaselzer plus, theraflu without improvement."  Reports fever 102.4 yesterday.  She took tylenol this AM.  Notes intermittent chills.  She tells me that she just moved to [place] and wishes to continue her care at the [facility] location.    Review of Systems See HPI  No past medical history on file.  History   Social History  . Marital Status: Married    Spouse Name: N/A    Number of Children: N/A  . Years of Education: N/A   Occupational History  . Not on file.   Social History Main Topics  . Smoking status: Not on file  . Smokeless tobacco: Not on file  . Alcohol Use: Not on file  . Drug Use: Not on file  . Sexually Active: Not on file   Other Topics Concern  . Not on file   Social History Narrative  . No narrative on file    No past surgical history on file.  No family history on file.  Allergies  Allergen Reactions  . Clarithromycin     REACTION: VOMITING  . Levofloxacin     REACTION: VOMITING  . Penicillins     REACTION: TONGUE SWELLING  . Promethazine Hcl   . Statins     No current outpatient prescriptions on file prior to visit.    BP 118/88  Pulse 72  Temp(Src) 98.1 F (36.7 C) (Oral)  Resp 16  Ht 5' 2.99" (1.6 m)  Wt 230 lb (104.327 kg)  BMI 40.75 kg/m2       Objective:   Physical Exam  Constitutional: She appears well-developed and well-nourished.       Tired appearing white female.   HENT:  Head: Normocephalic and atraumatic.       + Maxillary sinus tenderness to palpation bilaterally.  + pharyngeal erythema noted without exudates.  Bilateral TMs are pink without bulging.    Eyes:  Conjunctivae are normal. No scleral icterus.  Cardiovascular: Normal rate and regular rhythm.  Exam reveals no gallop and no friction rub.   No murmur heard. Pulmonary/Chest: Effort normal and breath sounds normal. No respiratory distress. She has no wheezes. She has no rales. She exhibits no tenderness.  Lymphadenopathy:    She has cervical adenopathy.  Skin: Skin is warm and dry.  Psychiatric: She has a normal mood and affect. Her speech is normal and behavior is normal.          Assessment & Plan:

## 2011-04-12 NOTE — Assessment & Plan Note (Signed)
Will plan to treat with doxycyline x 10 days given multiple drug intolerances/allergies.  Pt is instructed to call for follow up as outlined in pt instructions.

## 2011-05-07 LAB — GLUCOSE, CAPILLARY: Glucose-Capillary: 115 — ABNORMAL HIGH

## 2011-05-07 LAB — POCT I-STAT 4, (NA,K, GLUC, HGB,HCT): Hemoglobin: 16 — ABNORMAL HIGH

## 2011-05-13 LAB — BASIC METABOLIC PANEL
Chloride: 106
GFR calc non Af Amer: >60
Potassium: 4.4
Sodium: 141

## 2011-05-13 LAB — POCT HEMOGLOBIN-HEMACUE
Hemoglobin: 14
Operator id: 118191

## 2011-09-13 ENCOUNTER — Ambulatory Visit (INDEPENDENT_AMBULATORY_CARE_PROVIDER_SITE_OTHER): Payer: 59 | Admitting: Family

## 2011-09-13 ENCOUNTER — Encounter: Payer: Self-pay | Admitting: Family

## 2011-09-13 DIAGNOSIS — J40 Bronchitis, not specified as acute or chronic: Secondary | ICD-10-CM

## 2011-09-13 MED ORDER — HYDROCOD POLST-CHLORPHEN POLST 10-8 MG/5ML PO LQCR: 5.0000 mL | Freq: Two times a day (BID) | ORAL | Status: DC | PRN

## 2011-09-13 MED ORDER — AZITHROMYCIN 250 MG PO TABS: ORAL_TABLET | ORAL | Status: AC

## 2011-09-13 MED ORDER — ALBUTEROL SULFATE HFA 108 (90 BASE) MCG/ACT IN AERS: 2.0000 | INHALATION_SPRAY | Freq: Four times a day (QID) | RESPIRATORY_TRACT | Status: DC | PRN

## 2011-09-13 MED ORDER — FLUTICASONE-SALMETEROL 250-50 MCG/DOSE IN AEPB: 1.0000 | INHALATION_SPRAY | Freq: Two times a day (BID) | RESPIRATORY_TRACT | Status: DC

## 2011-09-13 NOTE — Patient Instructions (Addendum)
Call if your symptoms worsen or if no improvement in 2-3 days.  

## 2011-09-13 NOTE — Progress Notes (Signed)
  Subjective:    Patient ID: [name], female    DOB: [birth year], 56 y.o.   MRN: [id number]  HPI  [name] is a 56 yr old female who presents today with chief complaint of sore throat.  Symptoms started 14 days ago and are associated with post nasal drip, and cough which is productive of green sputum.  Reports subjective temp- feels hot then chills.  Using aspirin which is helping with her chills.  Can't sleep at night due to severe coughing.     Review of Systems See HPI  No past medical history on file.  History   Social History  . Marital Status: Married    Spouse Name: N/A    Number of Children: N/A  . Years of Education: N/A   Occupational History  . Not on file.   Social History Main Topics  . Smoking status: Former Smoker    Quit date: 09/12/2005  . Smokeless tobacco: Never Used  . Alcohol Use: Not on file  . Drug Use: Not on file  . Sexually Active: Not on file   Other Topics Concern  . Not on file   Social History Narrative  . No narrative on file    No past surgical history on file.  No family history on file.  Allergies  Allergen Reactions  . Clarithromycin     REACTION: VOMITING  . Levofloxacin     REACTION: VOMITING  . Penicillins     REACTION: TONGUE SWELLING  . Promethazine Hcl   . Statins     No current outpatient prescriptions on file prior to visit.    BP 118/88  Pulse 78  Temp(Src) 98.2 F (36.8 C) (Oral)  Resp 18  Wt 236 lb 1.3 oz (107.085 kg)  SpO2 95%       Objective:   Physical Exam  Constitutional: She appears well-developed and well-nourished. No distress.  HENT:  Head: Normocephalic and atraumatic.  Right Ear: Tympanic membrane and ear canal normal.  Left Ear: Tympanic membrane and ear canal normal.  Mouth/Throat: Posterior oropharyngeal erythema present. No oropharyngeal exudate, posterior oropharyngeal edema or tonsillar abscesses.  Cardiovascular: Normal rate and regular rhythm.   No murmur  heard. Pulmonary/Chest: Effort normal and breath sounds normal. No respiratory distress. She has no wheezes. She has no rales. She exhibits no tenderness.  Musculoskeletal: She exhibits no edema.  Psychiatric: She has a normal mood and affect. Her behavior is normal. Judgment and thought content normal.          Assessment & Plan:

## 2011-09-13 NOTE — Assessment & Plan Note (Addendum)
Will plan to treat with zithromax, refill her albuterol inhaler, and add tussionex prn cough.  She was instructed that tussionex may cause drowsiness and not to drive after using this medication. She tells me that she has been able to tolerate zpak in the past without any adverse reactions.

## 2011-10-14 ENCOUNTER — Encounter: Payer: Self-pay | Admitting: Internal Medicine

## 2011-10-14 ENCOUNTER — Ambulatory Visit (INDEPENDENT_AMBULATORY_CARE_PROVIDER_SITE_OTHER): Payer: 59 | Admitting: Internal Medicine

## 2011-10-14 VITALS — BP 124/80 | HR 82 | Temp 98.2°F | Resp 18

## 2011-10-14 DIAGNOSIS — J40 Bronchitis, not specified as acute or chronic: Secondary | ICD-10-CM

## 2011-10-14 DIAGNOSIS — J019 Acute sinusitis, unspecified: Secondary | ICD-10-CM

## 2011-10-14 MED ORDER — DOXYCYCLINE HYCLATE 100 MG PO TABS: 100.0000 mg | ORAL_TABLET | Freq: Two times a day (BID) | ORAL | Status: AC

## 2011-10-14 MED ORDER — METHYLPREDNISOLONE ACETATE 40 MG/ML IJ SUSP
40.0000 mg | Freq: Once | INTRAMUSCULAR | Status: AC
  Administered 2011-10-14: 40 mg via INTRAMUSCULAR

## 2011-10-14 NOTE — Progress Notes (Signed)
  Subjective:    Patient ID: [name], female    DOB: 1956-05-13, 56 y.o.   MRN: [id number]  HPI Pt presents to clinic for evaluation of possible sinusitis. Notes recent preceeding uri she feels never fully resolved. Since then has had 4day h/o worsening nasal congestion and bilateral maxillary and frontal sinus pain/pressure. Had fever with tm103 without chill. Cough is np. Taking zyrtec without improvement. No other alleviating or exacerbating factors.  No past medical history on file. No past surgical history on file.  reports that she quit smoking about 6 years ago. She has never used smokeless tobacco. Her alcohol and drug histories not on file. family history is not on file. Allergies  Allergen Reactions  . Clarithromycin     REACTION: VOMITING  . Levofloxacin     REACTION: VOMITING  . Penicillins     REACTION: TONGUE SWELLING  . Promethazine Hcl   . Statins      Review of Systems see hpi     Objective:   Physical Exam  Nursing note and vitals reviewed. Constitutional: She appears well-developed and well-nourished. No distress.  HENT:  Head: Normocephalic and atraumatic.  Right Ear: External ear normal.  Left Ear: External ear normal.  Nose: Right sinus exhibits maxillary sinus tenderness. Left sinus exhibits maxillary sinus tenderness.  Mouth/Throat: Oropharynx is clear and moist. No oropharyngeal exudate.  Eyes: EOM are normal.  Cardiovascular: Normal rate, regular rhythm and normal heart sounds.   Pulmonary/Chest: Effort normal and breath sounds normal. No respiratory distress. She has no wheezes. She has no rales.  Skin: She is not diaphoretic.          Assessment & Plan:

## 2011-10-14 NOTE — Assessment & Plan Note (Signed)
Attempt depomedrol im ijxn and course of doxycycline. Followup if no improvement or worsening.

## 2012-01-03 ENCOUNTER — Ambulatory Visit: Payer: 59 | Admitting: Internal Medicine

## 2012-03-16 ENCOUNTER — Encounter: Payer: Self-pay | Admitting: Family

## 2012-03-16 ENCOUNTER — Ambulatory Visit: Payer: 59 | Admitting: Family

## 2012-03-16 ENCOUNTER — Ambulatory Visit (INDEPENDENT_AMBULATORY_CARE_PROVIDER_SITE_OTHER): Payer: 59 | Admitting: Family

## 2012-03-16 VITALS — BP 122/86 | HR 83 | Temp 98.1°F | Resp 16 | Ht 62.99 in | Wt 239.0 lb

## 2012-03-16 DIAGNOSIS — R519 Headache, unspecified: Secondary | ICD-10-CM

## 2012-03-16 DIAGNOSIS — M792 Neuralgia and neuritis, unspecified: Secondary | ICD-10-CM | POA: Insufficient documentation

## 2012-03-16 DIAGNOSIS — IMO0002 Reserved for concepts with insufficient information to code with codable children: Secondary | ICD-10-CM

## 2012-03-16 DIAGNOSIS — R51 Headache: Secondary | ICD-10-CM

## 2012-03-16 MED ORDER — METHYLPREDNISOLONE (PAK) 4 MG PO TABS: ORAL_TABLET | ORAL | Status: AC

## 2012-03-16 NOTE — Progress Notes (Signed)
  Subjective:    Patient ID: [name], female    DOB: [birth year], 56 y.o.   MRN: [id number]  HPI  56 yr old female presents today with chief complaint of right sided head pain which seems to radiate down into the ear and right throat area. Pt reports that her ear pain started 2 days ago and radiates from above the ear down into the throat. Reports that pain is "like someone is shocking me."  She reports some mild  right cheek pain and that her scalp is sore to touch on the right. Pain occurs a often as every 13 seconds according to the patient.     Review of Systems See HPI  No past medical history on file.  History   Social History  . Marital Status: Married    Spouse Name: N/A    Number of Children: N/A  . Years of Education: N/A   Occupational History  . Not on file.   Social History Main Topics  . Smoking status: Former Smoker    Quit date: 09/12/2005  . Smokeless tobacco: Never Used  . Alcohol Use: Not on file  . Drug Use: Not on file  . Sexually Active: Not on file   Other Topics Concern  . Not on file   Social History Narrative  . No narrative on file    No past surgical history on file.  No family history on file.  Allergies  Allergen Reactions  . Clarithromycin     REACTION: VOMITING  . Levofloxacin     REACTION: VOMITING  . Penicillins     REACTION: TONGUE SWELLING  . Promethazine Hcl   . Statins     Current Outpatient Prescriptions on File Prior to Visit  Medication Sig Dispense Refill  . albuterol (PROVENTIL HFA;VENTOLIN HFA) 108 (90 BASE) MCG/ACT inhaler Inhale 2 puffs into the lungs every 6 (six) hours as needed for wheezing.  1 Inhaler  3  . ibuprofen (ADVIL,MOTRIN) 200 MG tablet Take 200 mg by mouth every 6 (six) hours as needed.      . Chlorphen-Phenyleph-ASA (ALKA-SELTZER PLUS COLD PO) Take by mouth.      . Fluticasone-Salmeterol (ADVAIR DISKUS) 250-50 MCG/DOSE AEPB Inhale 1 puff into the lungs 2 (two) times daily.  1 each  0  .  guaiFENesin (MUCINEX) 600 MG 12 hr tablet Take 1,200 mg by mouth 2 (two) times daily.        BP 122/86  Pulse 83  Temp 98.1 F (36.7 C) (Oral)  Resp 16  Ht 5' 2.99" (1.6 m)  Wt 239 lb (108.41 kg)  BMI 42.35 kg/m2  SpO2 96%       Objective:   Physical Exam  Constitutional: She appears well-developed and well-nourished. No distress.  HENT:  Head: Normocephalic and atraumatic.  Right Ear: Tympanic membrane and ear canal normal.  Left Ear: Tympanic membrane and ear canal normal.  Mouth/Throat: No posterior oropharyngeal edema or posterior oropharyngeal erythema.  Neck:       Slightly prominent upper bilateral cervical LN's.  Cardiovascular: Normal rate and regular rhythm.   No murmur heard. Pulmonary/Chest: Effort normal and breath sounds normal. No respiratory distress. She has no wheezes. She has no rales. She exhibits no tenderness.  Neuro:  Speech is clear, + facial symmetry        Assessment & Plan:

## 2012-03-16 NOTE — Assessment & Plan Note (Addendum)
Will obtain sed rate to exclude Temporal arteritis.  Trial of Medrol dose pak.  Pt is instructed to follow up in 2 weeks.  Case discussed with Dr. Rodena Medin.

## 2012-03-16 NOTE — Patient Instructions (Addendum)
Please call if symptoms worsen, or if no improvement in 2-3 days.    

## 2012-03-17 ENCOUNTER — Encounter: Payer: Self-pay | Admitting: Family

## 2012-03-17 LAB — SEDIMENTATION RATE: Sed Rate: 8 mm/hr (ref 0–22)

## 2012-10-20 ENCOUNTER — Ambulatory Visit (INDEPENDENT_AMBULATORY_CARE_PROVIDER_SITE_OTHER): Payer: 59 | Admitting: Family

## 2012-10-20 ENCOUNTER — Encounter: Payer: Self-pay | Admitting: Family

## 2012-10-20 VITALS — BP 110/82 | HR 73 | Temp 97.7°F | Resp 18 | Ht 62.0 in | Wt 247.0 lb

## 2012-10-20 DIAGNOSIS — J019 Acute sinusitis, unspecified: Secondary | ICD-10-CM

## 2012-10-20 MED ORDER — AZITHROMYCIN 250 MG PO TABS: ORAL_TABLET | ORAL | Status: DC

## 2012-10-20 MED ORDER — ALBUTEROL SULFATE HFA 108 (90 BASE) MCG/ACT IN AERS: 2.0000 | INHALATION_SPRAY | Freq: Four times a day (QID) | RESPIRATORY_TRACT | Status: DC | PRN

## 2012-10-20 MED ORDER — HYDROCOD POLST-CHLORPHEN POLST 10-8 MG/5ML PO LQCR: 5.0000 mL | Freq: Every evening | ORAL | Status: DC | PRN

## 2012-10-20 NOTE — Progress Notes (Signed)
  Subjective:    Patient ID: [name], female    DOB: [birth year], 57 y.o.   MRN: [id number]  HPI  Ms.  Schulte is a 57 yr old female who presents today with chief complaint of sinus congestion. She reports that congestion started 1 week ago and is associated with cough and wheezing. She has tried otc [employer] cold/sinus. She switched to zyrtec and mucinex.  No improvement with these meds. No known fever.    Review of Systems    see HPI  No past medical history on file.  History   Social History  . Marital Status: Married    Spouse Name: N/A    Number of Children: N/A  . Years of Education: N/A   Occupational History  . Not on file.   Social History Main Topics  . Smoking status: Former Smoker    Quit date: 09/12/2005  . Smokeless tobacco: Never Used  . Alcohol Use: Not on file  . Drug Use: Not on file  . Sexually Active: Not on file   Other Topics Concern  . Not on file   Social History Narrative  . No narrative on file    No past surgical history on file.  No family history on file.  Allergies  Allergen Reactions  . Clarithromycin     REACTION: VOMITING  . Levofloxacin     REACTION: VOMITING  . Penicillins     REACTION: TONGUE SWELLING  . Promethazine Hcl   . Statins     Current Outpatient Prescriptions on File Prior to Visit  Medication Sig Dispense Refill  . albuterol (PROVENTIL HFA;VENTOLIN HFA) 108 (90 BASE) MCG/ACT inhaler Inhale 2 puffs into the lungs every 6 (six) hours as needed for wheezing.  1 Inhaler  3  . ibuprofen (ADVIL,MOTRIN) 200 MG tablet Take 200 mg by mouth every 6 (six) hours as needed.       No current facility-administered medications on file prior to visit.    BP 110/82  Pulse 73  Temp(Src) 97.7 F (36.5 C) (Oral)  Resp 18  Ht 5\' 2"  (1.575 m)  Wt 247 lb (112.038 kg)  BMI 45.17 kg/m2  SpO2 97%    Objective:   Physical Exam  Constitutional: She is oriented to person, place, and time. She appears well-developed  and well-nourished. No distress.  HENT:  Head: Normocephalic and atraumatic.  Right Ear: Tympanic membrane and ear canal normal.  Left Ear: Tympanic membrane and ear canal normal.  Mouth/Throat: No oropharyngeal exudate, posterior oropharyngeal edema or posterior oropharyngeal erythema.  Maxillary or sinus tenderness to palpation bilaterally  Cardiovascular: Normal rate and regular rhythm.   No murmur heard. Pulmonary/Chest: Effort normal and breath sounds normal. No respiratory distress. She has no wheezes. She has no rales.  Musculoskeletal: She exhibits no edema.  Neurological: She is alert and oriented to person, place, and time.  Psychiatric: She has a normal mood and affect. Her behavior is normal. Judgment and thought content normal.          Assessment & Plan:

## 2012-10-20 NOTE — Patient Instructions (Addendum)

## 2012-10-20 NOTE — Assessment & Plan Note (Signed)
Will rx with Zpak as she has tolerated this in the past. Will also rx tussionex to be used HS for cough.

## 2012-11-02 ENCOUNTER — Ambulatory Visit (INDEPENDENT_AMBULATORY_CARE_PROVIDER_SITE_OTHER): Payer: 59 | Admitting: Family

## 2012-11-02 ENCOUNTER — Encounter: Payer: Self-pay | Admitting: Family

## 2012-11-02 VITALS — BP 120/90 | HR 82 | Temp 98.7°F | Resp 16

## 2012-11-02 DIAGNOSIS — M545 Low back pain, unspecified: Secondary | ICD-10-CM | POA: Insufficient documentation

## 2012-11-02 MED ORDER — METHYLPREDNISOLONE 4 MG PO KIT: PACK | ORAL | Status: DC

## 2012-11-02 MED ORDER — CYCLOBENZAPRINE HCL 10 MG PO TABS: 10.0000 mg | ORAL_TABLET | Freq: Three times a day (TID) | ORAL | Status: DC | PRN

## 2012-11-02 MED ORDER — TRAMADOL HCL 50 MG PO TABS: 50.0000 mg | ORAL_TABLET | Freq: Three times a day (TID) | ORAL | Status: DC | PRN

## 2012-11-02 NOTE — Assessment & Plan Note (Addendum)
Trial of medrol dose pak, tramadol prn/flexeril PRN.  Call if symptoms worsen or if symptoms do not improve. She is specifically instructed to call if she develops bowel/bladder problems, numbness, or fever. Pt verbalizes understanding.

## 2012-11-02 NOTE — Patient Instructions (Addendum)
Please call if symptoms worsen, or if not improved in 2-3 days.  

## 2012-11-02 NOTE — Progress Notes (Signed)
  Subjective:    Patient ID: [name], female    DOB: October 29, 1955, 57 y.o.   MRN: [id number]  HPI  [name] is a 57 yr old female who presents today with chief complaint of back pain. She reports that she purchased some orthopedic shoes for plantar fasciitis.  Feet felt better, but then she developed some mid back pain  She stopped wearing shoes.  Also had some pain in the right shoulder blade which radiated down the right arm.  Had a massage last Tuesday. Had temporary improvement but then pain in the lower back.  Reports that she has increased pain with bowel movement.  Reports pain is mostly in the iliac region.  Denies bowel/bladder incontinence.  She denies associated fever.  She has tried OTC aleve.  She has also tried methocarbamol without improvement.  Took husband's valium which took the edge off but "did not help."  Tried to get in with ortho but could not get in until Wednesday.  Could not see a back person until 4/10.     Review of Systems See HPI  No past medical history on file.  History   Social History  . Marital Status: Married    Spouse Name: N/A    Number of Children: N/A  . Years of Education: N/A   Occupational History  . Not on file.   Social History Main Topics  . Smoking status: Former Smoker    Quit date: 09/12/2005  . Smokeless tobacco: Never Used  . Alcohol Use: Not on file  . Drug Use: Not on file  . Sexually Active: Not on file   Other Topics Concern  . Not on file   Social History Narrative  . No narrative on file    No past surgical history on file.  No family history on file.  Allergies  Allergen Reactions  . Clarithromycin     REACTION: VOMITING  . Levofloxacin     REACTION: VOMITING  . Penicillins     REACTION: TONGUE SWELLING  . Promethazine Hcl   . Statins     Current Outpatient Prescriptions on File Prior to Visit  Medication Sig Dispense Refill  . albuterol (PROVENTIL HFA;VENTOLIN HFA) 108 (90 BASE) MCG/ACT inhaler  Inhale 2 puffs into the lungs every 6 (six) hours as needed for wheezing.  1 Inhaler  3  . ibuprofen (ADVIL,MOTRIN) 200 MG tablet Take 200 mg by mouth every 6 (six) hours as needed.       No current facility-administered medications on file prior to visit.    BP 120/90  Pulse 82  Temp(Src) 98.7 F (37.1 C) (Oral)  Resp 16  SpO2 97%       Objective:   Physical Exam  Constitutional: She is oriented to person, place, and time. She appears well-developed and well-nourished. No distress.  HENT:  Head: Normocephalic and atraumatic.  Cardiovascular: Normal rate and regular rhythm.   No murmur heard. Pulmonary/Chest: Effort normal and breath sounds normal. No respiratory distress. She has no wheezes. She has no rales. She exhibits no tenderness.  Musculoskeletal: She exhibits no edema.  + tenderness to palpation bilateral Lower back   Neurological: She is alert and oriented to person, place, and time.  Bilateral LE strength is 5/5  Skin: Skin is warm and dry.  Psychiatric: She has a normal mood and affect. Her behavior is normal. Judgment and thought content normal.          Assessment & Plan:

## 2012-11-23 ENCOUNTER — Other Ambulatory Visit: Payer: Self-pay | Admitting: Family

## 2012-11-23 ENCOUNTER — Encounter: Payer: Self-pay | Admitting: Family

## 2012-11-23 ENCOUNTER — Ambulatory Visit (INDEPENDENT_AMBULATORY_CARE_PROVIDER_SITE_OTHER): Payer: 59 | Admitting: Family

## 2012-11-23 VITALS — BP 106/80 | HR 84 | Temp 98.6°F | Resp 16 | Ht 63.5 in | Wt 242.0 lb

## 2012-11-23 DIAGNOSIS — E119 Type 2 diabetes mellitus without complications: Secondary | ICD-10-CM

## 2012-11-23 DIAGNOSIS — Z1231 Encounter for screening mammogram for malignant neoplasm of breast: Secondary | ICD-10-CM

## 2012-11-23 DIAGNOSIS — Z0000 Encounter for general adult medical examination without abnormal findings: Secondary | ICD-10-CM

## 2012-11-23 LAB — BASIC METABOLIC PANEL WITH GFR
BUN: 18 mg/dL (ref 6–23)
Calcium: 9.9 mg/dL (ref 8.4–10.5)
Creat: 0.77 mg/dL (ref 0.50–1.10)
GFR, Est African American: >89 mL/min
GFR, Est Non African American: 88 mL/min

## 2012-11-23 LAB — CBC WITH DIFFERENTIAL/PLATELET
Basophils Absolute: 0.1 10*3/uL (ref 0.0–0.1)
Basophils Relative: 1 % (ref 0–1)
Eosinophils Absolute: 0.2 10*3/uL (ref 0.0–0.7)
MCH: 29.3 pg (ref 26.0–34.0)
MCHC: 34.5 g/dL (ref 30.0–36.0)
Neutro Abs: 3.2 10*3/uL (ref 1.7–7.7)
Neutrophils Relative %: 58 % (ref 43–77)
RDW: 14.5 % (ref 11.5–15.5)

## 2012-11-23 LAB — URINALYSIS, ROUTINE W REFLEX MICROSCOPIC
Bilirubin Urine: NEGATIVE
Hgb urine dipstick: NEGATIVE
Ketones, ur: NEGATIVE mg/dL
Protein, ur: NEGATIVE mg/dL
Urobilinogen, UA: 0.2 mg/dL (ref 0.0–1.0)

## 2012-11-23 LAB — LIPID PANEL
Cholesterol: 257 mg/dL — ABNORMAL HIGH (ref 0–200)
Triglycerides: 239 mg/dL — ABNORMAL HIGH (ref <150)
VLDL: 48 mg/dL — ABNORMAL HIGH (ref 0–40)

## 2012-11-23 LAB — HEPATIC FUNCTION PANEL
ALT: 29 U/L (ref 0–35)
Albumin: 4.4 g/dL (ref 3.5–5.2)
Bilirubin, Direct: 0.1 mg/dL (ref 0.0–0.3)
Total Bilirubin: 0.5 mg/dL (ref 0.3–1.2)

## 2012-11-23 NOTE — Progress Notes (Signed)
  Subjective:    Patient ID: [name], female    DOB: 1955/12/10, 57 y.o.   MRN: [id number]  HPI  [name] is a 57 yr old female here today for cpx.  Patient presents today for complete physical.  Immunizations: Due Diet: needs improvement Exercise: not exercising regularly Colonoscopy:Due Dexa:Due Pap Smear: 12/12- she follows with Dr. Rosemary Holms. Mammogram: due   Review of Systems  Constitutional: Negative for unexpected weight change.  HENT: Negative for congestion.   Eyes: Negative for visual disturbance.  Respiratory: Negative for cough.   Gastrointestinal: Negative for vomiting and diarrhea.  Endocrine: Positive for polyuria.  Genitourinary: Negative for dysuria.  Musculoskeletal: Positive for arthralgias. Negative for myalgias.  Skin: Negative for rash.  Neurological: Negative for headaches.  Hematological: Negative for adenopathy.  Psychiatric/Behavioral:       Denies depression/anxiety       Objective:   Physical Exam  Physical Exam  Constitutional: She is oriented to person, place, and time. She appears well-developed and well-nourished. No distress.  HENT:  Head: Normocephalic and atraumatic.  Right Ear: Tympanic membrane and ear canal normal.  Left Ear: Tympanic membrane and ear canal normal.  Mouth/Throat: Oropharynx is clear and moist.  Eyes: Pupils are equal, round, and reactive to light. No scleral icterus.  Neck: Normal range of motion. No thyromegaly present.  Cardiovascular: Normal rate and regular rhythm.   No murmur heard. Pulmonary/Chest: Effort normal and breath sounds normal. No respiratory distress. He has no wheezes. She has no rales. She exhibits no tenderness.  Abdominal: Soft. Bowel sounds are normal. He exhibits no distension and no mass. There is no tenderness. There is no rebound and no guarding.  Musculoskeletal: She exhibits no edema.  Lymphadenopathy:    She has no cervical adenopathy.  Neurological: She is alert and oriented  to person, place, and time. She has normal reflexes. She exhibits normal muscle tone. Coordination normal.  Skin: Skin is warm and dry.  Psychiatric: She has a normal mood and affect. Her behavior is normal. Judgment and thought content normal.  Breasts: Examined lying Right: Without masses, retractions, discharge or axillary adenopathy.  Left: Without masses, retractions, discharge or axillary adenopathy.  Bilateral breast scars noted from previous augmentation.           Assessment & Plan:          Assessment & Plan:

## 2012-11-23 NOTE — Assessment & Plan Note (Signed)
Pt counseled on diet, exercise and weight loss.  Pap up to date- sees gyn.  Due for mammo, dexa, colo and fasting labs- ordered today.

## 2012-11-23 NOTE — Patient Instructions (Addendum)
Schedule mammogram on the first floor. Schedule bone density at the front desk. You will be contacted about your referral for colonoscopy.  Complete your lab work prior to leaving.

## 2012-11-24 ENCOUNTER — Ambulatory Visit (INDEPENDENT_AMBULATORY_CARE_PROVIDER_SITE_OTHER)
Admission: RE | Admit: 2012-11-24 | Discharge: 2012-11-24 | Disposition: A | Discharge status: Home / Self Care | Payer: 59 | Source: Ambulatory Visit | Attending: Family | Admitting: Family

## 2012-11-24 ENCOUNTER — Encounter: Payer: Self-pay | Admitting: Family

## 2012-11-24 DIAGNOSIS — Z0000 Encounter for general adult medical examination without abnormal findings: Secondary | ICD-10-CM

## 2012-11-25 ENCOUNTER — Ambulatory Visit (HOSPITAL_BASED_OUTPATIENT_CLINIC_OR_DEPARTMENT_OTHER)
Admission: RE | Admit: 2012-11-25 | Discharge: 2012-11-25 | Disposition: A | Discharge status: Home / Self Care | Payer: 59 | Source: Ambulatory Visit | Attending: Family | Admitting: Family

## 2012-11-25 DIAGNOSIS — Z1231 Encounter for screening mammogram for malignant neoplasm of breast: Secondary | ICD-10-CM | POA: Insufficient documentation

## 2012-11-26 ENCOUNTER — Telehealth: Payer: Self-pay | Admitting: Family

## 2012-11-26 NOTE — Telephone Encounter (Signed)
PATIENT NEEDS A COPY OF HER BLOOD WORK AS SHE IS BEING ASKED QUESTIONS ABOUT MEDICAL HEALTH PRIOR TO A COLONOSCOPY

## 2012-11-27 NOTE — Telephone Encounter (Signed)
Notified pt that she should be receiving results in the mail soon. Pt reports that she received the results via "mychart".

## 2013-02-16 ENCOUNTER — Encounter: Payer: Self-pay | Admitting: Gastroenterology

## 2013-05-15 ENCOUNTER — Encounter: Payer: Self-pay | Admitting: Family Medicine

## 2013-05-15 ENCOUNTER — Ambulatory Visit (INDEPENDENT_AMBULATORY_CARE_PROVIDER_SITE_OTHER): Payer: 59 | Admitting: Family Medicine

## 2013-05-15 VITALS — BP 110/84 | HR 80 | Temp 98.6°F | Wt 242.1 lb

## 2013-05-15 DIAGNOSIS — J209 Acute bronchitis, unspecified: Secondary | ICD-10-CM

## 2013-05-15 MED ORDER — ALBUTEROL SULFATE HFA 108 (90 BASE) MCG/ACT IN AERS: 2.0000 | INHALATION_SPRAY | RESPIRATORY_TRACT | Status: DC | PRN

## 2013-05-15 MED ORDER — AZITHROMYCIN 250 MG PO TABS: ORAL_TABLET | ORAL | Status: DC

## 2013-05-15 MED ORDER — HYDROCODONE-HOMATROPINE 5-1.5 MG/5ML PO SYRP: 5.0000 mL | ORAL_SOLUTION | ORAL | Status: DC | PRN

## 2013-05-15 NOTE — Progress Notes (Signed)
  Subjective:    Patient ID: [name], female    DOB: 10/12/1955, 57 y.o.   MRN: [id number]  HPI Here for 5 days of PND, chest tightness and coughing up green sputum. No fever.    Review of Systems  Constitutional: Negative.   HENT: Positive for congestion, postnasal drip and sinus pressure.   Eyes: Negative.   Respiratory: Positive for cough and wheezing.        Objective:   Physical Exam  Constitutional: She appears well-developed and well-nourished. No distress.  HENT:  Right Ear: External ear normal.  Left Ear: External ear normal.  Nose: Nose normal.  Mouth/Throat: Oropharynx is clear and moist. No oropharyngeal exudate.  Eyes: Conjunctivae are normal.  Pulmonary/Chest: Effort normal. No respiratory distress. She has no rales.  Scattered wheezes and rhonchi   Lymphadenopathy:    She has no cervical adenopathy.          Assessment & Plan:  Add Mucinex.

## 2013-05-17 ENCOUNTER — Telehealth: Payer: Self-pay | Admitting: *Deleted

## 2013-05-17 NOTE — Telephone Encounter (Signed)
To[facility] Schanz Fax: [phone] From: [facility] Date/ Time: 05/15/2013 8:33 AM Taken By: Gerri Spore, CSR Caller: Chip Boer Facility: not collected Patient: [name] DOB: 1956/06/22 Phone: [phone] Reason for Call: See info below Regarding Appointment: Yes Appt Date: Appt Time: 9:00:00 AM Provider: Reason: Details: Cough/Congestion Outcome: Scheduled appointment in

## 2013-06-10 ENCOUNTER — Other Ambulatory Visit: Payer: Self-pay

## 2013-08-31 ENCOUNTER — Encounter: Payer: Self-pay | Admitting: Family

## 2013-08-31 ENCOUNTER — Ambulatory Visit (INDEPENDENT_AMBULATORY_CARE_PROVIDER_SITE_OTHER): Payer: 59 | Admitting: Family

## 2013-08-31 VITALS — BP 108/70 | HR 97 | Temp 99.0°F | Resp 18 | Ht 63.5 in | Wt 239.0 lb

## 2013-08-31 DIAGNOSIS — J069 Acute upper respiratory infection, unspecified: Secondary | ICD-10-CM

## 2013-08-31 DIAGNOSIS — R059 Cough, unspecified: Secondary | ICD-10-CM

## 2013-08-31 DIAGNOSIS — R05 Cough: Secondary | ICD-10-CM

## 2013-08-31 LAB — POCT INFLUENZA A/B
Influenza A, POC: NEGATIVE
Influenza B, POC: NEGATIVE

## 2013-08-31 NOTE — Progress Notes (Signed)
Subjective:    Patient ID: [name], female    DOB: Dec 26, 1955, 58 y.o.   MRN: [id number]  HPI  [name] is a 58 yr old female with chief complaint of facial forehead "fullness."  Reports associated tight cough.  Symptoms started 2 days ago.  + associated chills.  She has tried alkaselzer plus, vitamin C, zyrtec and tylenol.  No improvement with these measures.  Took tylenol 2 hours ago.  Reports fever 102 last night.     Review of Systems    see HPI  Past Medical History  Diagnosis Date  . Hypertension   . Hyperlipidemia   . History of TIA (transient ischemic attack) 2004?    History   Social History  . Marital Status: Married    Spouse Name: N/A    Number of Children: N/A  . Years of Education: N/A   Occupational History  . Not on file.   Social History Main Topics  . Smoking status: Former Smoker    Quit date: 09/12/2005  . Smokeless tobacco: Never Used  . Alcohol Use: Not on file  . Drug Use: Not on file  . Sexual Activity: Not on file   Other Topics Concern  . Not on file   Social History Narrative  . No narrative on file    Past Surgical History  Procedure Laterality Date  . Nasal septum surgery  1981  . Bunionectomy Left 2008    Dr [name]  . Shoulder surgery Left     Dr [name]. Rotator cuff repair  . Appendectomy  1979  . Tonsillectomy  1964  . Tubal ligation  1999  . Cesarean section  1986    Family History  Problem Relation Age of Onset  . Cancer Mother     lung  . Diabetes Mother   . Thalassemia Mother   . Thalassemia Sister   . Cancer Brother     lymphatic  . Thalassemia Maternal Aunt   . Heart disease Paternal Aunt     Allergies  Allergen Reactions  . Clarithromycin     REACTION: VOMITING  . Levofloxacin     REACTION: VOMITING  . Penicillins     REACTION: TONGUE SWELLING  . Promethazine Hcl   . Statins     Current Outpatient Prescriptions on File Prior to Visit  Medication Sig Dispense Refill  .  albuterol (PROVENTIL HFA;VENTOLIN HFA) 108 (90 BASE) MCG/ACT inhaler Inhale 2 puffs into the lungs every 4 (four) hours as needed for wheezing or shortness of breath.  1 Inhaler  0   No current facility-administered medications on file prior to visit.    BP 108/70  Pulse 97  Temp(Src) 99 F (37.2 C) (Oral)  Resp 18  Ht 5' 3.5" (1.613 m)  Wt 239 lb (108.41 kg)  BMI 41.67 kg/m2  SpO2 96%    Objective:   Physical Exam  Constitutional: She is oriented to person, place, and time. She appears well-developed and well-nourished. No distress.  HENT:  Head: Normocephalic and atraumatic.  Right Ear: Tympanic membrane and ear canal normal.  Left Ear: Tympanic membrane and ear canal normal.  Mouth/Throat: No oropharyngeal exudate, posterior oropharyngeal edema or posterior oropharyngeal erythema.  Cardiovascular: Normal rate and regular rhythm.   No murmur heard. Pulmonary/Chest: Effort normal and breath sounds normal. No respiratory distress. She has no wheezes. She has no rales. She exhibits no tenderness.  Neurological: She is alert and oriented to person, place, and time.  Psychiatric:  She has a normal mood and affect. Her behavior is normal. Judgment and thought content normal.          Assessment & Plan:

## 2013-08-31 NOTE — Patient Instructions (Signed)
Viral Infections °A virus is a type of germ. Viruses can cause: °· Minor sore throats. °· Aches and pains. °· Headaches. °· Runny nose. °· Rashes. °· Watery eyes. °· Tiredness. °· Coughs. °· Loss of appetite. °· Feeling sick to your stomach (nausea). °· Throwing up (vomiting). °· Watery poop (diarrhea). °HOME CARE  °· Only take medicines as told by your doctor. °· Drink enough water and fluids to keep your pee (urine) clear or pale yellow. Sports drinks are a good choice. °· Get plenty of rest and eat healthy. Soups and broths with crackers or rice are fine. °GET HELP RIGHT AWAY IF:  °· You have a very bad headache. °· You have shortness of breath. °· You have chest pain or neck pain. °· You have an unusual rash. °· You cannot stop throwing up. °· You have watery poop that does not stop. °· You cannot keep fluids down. °· You or your child has a temperature by mouth above 102° F (38.9° C), not controlled by medicine. °· Your baby is older than 3 months with a rectal temperature of 102° F (38.9° C) or higher. °· Your baby is 3 months old or younger with a rectal temperature of 100.4° F (38° C) or higher. °MAKE SURE YOU:  °· Understand these instructions. °· Will watch this condition. °· Will get help right away if you are not doing well or get worse. °Document Released: 07/04/2008 Document Revised: 10/14/2011 Document Reviewed: 11/27/2010 °[employer]® Patient Information ©2014 [employer]. ° °

## 2013-08-31 NOTE — Progress Notes (Signed)
Pre visit review using our clinic review tool, if applicable. No additional management support is needed unless otherwise documented below in the visit note. 

## 2013-09-02 DIAGNOSIS — J069 Acute upper respiratory infection, unspecified: Secondary | ICD-10-CM | POA: Insufficient documentation

## 2013-09-02 NOTE — Assessment & Plan Note (Signed)
Rapid flu negative.  Advised pt on supportive measures and to call if symptoms worsen or if not improved in 2-3 days.

## 2013-09-03 ENCOUNTER — Telehealth: Payer: Self-pay | Admitting: Family

## 2013-09-03 MED ORDER — DOXYCYCLINE HYCLATE 100 MG PO TABS: 100.0000 mg | ORAL_TABLET | Freq: Two times a day (BID) | ORAL | Status: DC

## 2013-09-03 NOTE — Telephone Encounter (Signed)
Notified pt. 

## 2013-09-03 NOTE — Telephone Encounter (Signed)
Patient states that she is much worse since last visit and is requesting that [name] call in an antibiotic to [facility] on [address] [facility] [address] [facility] [address] [facility] [facility]. She states that she is now coughing up "yellow clumps of snot"

## 2013-09-03 NOTE — Telephone Encounter (Signed)
Rx sent for doxycycline. She should call me if no improvement by Monday morning.

## 2013-11-15 ENCOUNTER — Ambulatory Visit (INDEPENDENT_AMBULATORY_CARE_PROVIDER_SITE_OTHER): Payer: 59 | Admitting: Physician Assistant

## 2013-11-15 ENCOUNTER — Encounter: Payer: Self-pay | Admitting: Physician Assistant

## 2013-11-15 VITALS — BP 119/81 | HR 85 | Temp 98.1°F | Resp 16 | Ht 63.5 in | Wt 242.4 lb

## 2013-11-15 DIAGNOSIS — J329 Chronic sinusitis, unspecified: Secondary | ICD-10-CM

## 2013-11-15 DIAGNOSIS — H6690 Otitis media, unspecified, unspecified ear: Secondary | ICD-10-CM | POA: Insufficient documentation

## 2013-11-15 MED ORDER — AZITHROMYCIN 250 MG PO TABS: ORAL_TABLET | ORAL | Status: DC

## 2013-11-15 MED ORDER — CELECOXIB 200 MG PO CAPS: ORAL_CAPSULE | ORAL | Status: DC

## 2013-11-15 NOTE — Assessment & Plan Note (Signed)
Rx Azithromycin.  Increase fluid intake.  Rest.  Saline nasal spray.  Zyrtec and Flonase.  Place a humidifier in the bedroom.  Call or return to clinic if symptoms are not improving.

## 2013-11-15 NOTE — Patient Instructions (Signed)
Please take antibiotic as prescribed.  Increase fluid intake.  Rest.  Saline nasal spray.  Continue allergy and asthma medications.  Place a humidifier in the bedroom.  Call or return to clinic if symptoms are not improving.  Sinusitis Sinusitis is redness, soreness, and swelling (inflammation) of the paranasal sinuses. Paranasal sinuses are air pockets within the bones of your face (beneath the eyes, the middle of the forehead, or above the eyes). In healthy paranasal sinuses, mucus is able to drain out, and air is able to circulate through them by way of your nose. However, when your paranasal sinuses are inflamed, mucus and air can become trapped. This can allow bacteria and other germs to grow and cause infection. Sinusitis can develop quickly and last only a short time (acute) or continue over a long period (chronic). Sinusitis that lasts for more than 12 weeks is considered chronic.  CAUSES  Causes of sinusitis include:  Allergies.  Structural abnormalities, such as displacement of the cartilage that separates your nostrils (deviated septum), which can decrease the air flow through your nose and sinuses and affect sinus drainage.  Functional abnormalities, such as when the small hairs (cilia) that line your sinuses and help remove mucus do not work properly or are not present. SYMPTOMS  Symptoms of acute and chronic sinusitis are the same. The primary symptoms are pain and pressure around the affected sinuses. Other symptoms include:  Upper toothache.  Earache.  Headache.  Bad breath.  Decreased sense of smell and taste.  A cough, which worsens when you are lying flat.  Fatigue.  Fever.  Thick drainage from your nose, which often is green and may contain pus (purulent).  Swelling and warmth over the affected sinuses. DIAGNOSIS  Your caregiver will perform a physical exam. During the exam, your caregiver may:  Look in your nose for signs of abnormal growths in your nostrils  (nasal polyps).  Tap over the affected sinus to check for signs of infection.  View the inside of your sinuses (endoscopy) with a special imaging device with a light attached (endoscope), which is inserted into your sinuses. If your caregiver suspects that you have chronic sinusitis, one or more of the following tests may be recommended:  Allergy tests.  Nasal culture A sample of mucus is taken from your nose and sent to a lab and screened for bacteria.  Nasal cytology A sample of mucus is taken from your nose and examined by your caregiver to determine if your sinusitis is related to an allergy. TREATMENT  Most cases of acute sinusitis are related to a viral infection and will resolve on their own within 10 days. Sometimes medicines are prescribed to help relieve symptoms (pain medicine, decongestants, nasal steroid sprays, or saline sprays).  However, for sinusitis related to a bacterial infection, your caregiver will prescribe antibiotic medicines. These are medicines that will help kill the bacteria causing the infection.  Rarely, sinusitis is caused by a fungal infection. In theses cases, your caregiver will prescribe antifungal medicine. For some cases of chronic sinusitis, surgery is needed. Generally, these are cases in which sinusitis recurs more than 3 times per year, despite other treatments. HOME CARE INSTRUCTIONS   Drink plenty of water. Water helps thin the mucus so your sinuses can drain more easily.  Use a humidifier.  Inhale steam 3 to 4 times a day (for example, sit in the bathroom with the shower running).  Apply a warm, moist washcloth to your face 3 to 4 times  a day, or as directed by your caregiver.  Use saline nasal sprays to help moisten and clean your sinuses.  Take over-the-counter or prescription medicines for pain, discomfort, or fever only as directed by your caregiver. SEEK IMMEDIATE MEDICAL CARE IF:  You have increasing pain or severe headaches.  You  have nausea, vomiting, or drowsiness.  You have swelling around your face.  You have vision problems.  You have a stiff neck.  You have difficulty breathing. MAKE SURE YOU:   Understand these instructions.  Will watch your condition.  Will get help right away if you are not doing well or get worse. Document Released: 07/22/2005 Document Revised: 10/14/2011 Document Reviewed: 08/06/2011 [facility] Patient Information 2014 [place], [place].

## 2013-11-15 NOTE — Progress Notes (Signed)
Patient presents to clinic today c/o sinus pressure and pain, mostly left-sided for 1 week.  Patient also endorses L ear pain x 3 days.  Denies fever.  Denies tooth pain.  Denies cough, SOB or wheezing.  Denies increased use of rescue inhaler.  Denies recent travel or sick contact.  Past Medical History  Diagnosis Date  . Hypertension   . Hyperlipidemia   . History of TIA (transient ischemic attack) 2004?    Current Outpatient Prescriptions on File Prior to Visit  Medication Sig Dispense Refill  . albuterol (PROVENTIL HFA;VENTOLIN HFA) 108 (90 BASE) MCG/ACT inhaler Inhale 2 puffs into the lungs every 4 (four) hours as needed for wheezing or shortness of breath.  1 Inhaler  0   No current facility-administered medications on file prior to visit.    Allergies  Allergen Reactions  . Clarithromycin     REACTION: VOMITING  . Levofloxacin     REACTION: VOMITING  . Penicillins     REACTION: TONGUE SWELLING  . Promethazine Hcl   . Statins     Family History  Problem Relation Age of Onset  . Cancer Mother     lung  . Diabetes Mother   . Thalassemia Mother   . Thalassemia Sister   . Cancer Brother     lymphatic  . Thalassemia Maternal Aunt   . Heart disease Paternal Aunt     History   Social History  . Marital Status: Married    Spouse Name: N/A    Number of Children: N/A  . Years of Education: N/A   Social History Main Topics  . Smoking status: Former Smoker    Quit date: 09/12/2005  . Smokeless tobacco: Never Used  . Alcohol Use: None  . Drug Use: None  . Sexual Activity: None   Other Topics Concern  . None   Social History Narrative  . None   Review of Systems - See HPI.  All other ROS are negative.  BP 119/81  Pulse 85  Temp(Src) 98.1 F (36.7 C) (Oral)  Resp 16  Ht 5' 3.5" (1.613 m)  Wt 242 lb 6 oz (109.941 kg)  BMI 42.26 kg/m2  SpO2 96%  Physical Exam  Vitals reviewed. Constitutional: She is oriented to person, place, and time and  well-developed, well-nourished, and in no distress.  HENT:  Head: Normocephalic and atraumatic.  Right Ear: External ear normal.  Left Ear: External ear normal.  Nose: Nose normal.  Mouth/Throat: Oropharynx is clear and moist. No oropharyngeal exudate.  Left TM erythematous and bulging. R TM dull and yellowing, but without erythema, bulging or retraction. + TTP of left maxillary sinuses.    Eyes: Conjunctivae are normal. Pupils are equal, round, and reactive to light.  Neck: Neck supple.  Cardiovascular: Normal rate, regular rhythm, normal heart sounds and intact distal pulses.   Pulmonary/Chest: Effort normal and breath sounds normal. No respiratory distress. She has no wheezes. She has no rales. She exhibits no tenderness.  Lymphadenopathy:    She has no cervical adenopathy.  Neurological: She is alert and oriented to person, place, and time.  Skin: Skin is warm and dry. No rash noted.  Psychiatric: Affect normal.    Recent Results (from the past 2160 hour(s))  POCT INFLUENZA A/B     Status: None   Collection Time    08/31/13  5:25 PM      Result Value Ref Range   Influenza A, POC Negative     Influenza B, POC  Negative     Assessment/Plan: AOM (acute otitis media) Rx Azithromycin.  Encouraged Flonase.  See Plan for Acute sinusitis.  Sinusitis Rx Azithromycin.  Increase fluid intake.  Rest.  Saline nasal spray.  Zyrtec and Flonase.  Place a humidifier in the bedroom.  Call or return to clinic if symptoms are not improving.

## 2013-11-15 NOTE — Assessment & Plan Note (Signed)
Rx Azithromycin.  Encouraged Flonase.  See Plan for Acute sinusitis.

## 2014-02-14 ENCOUNTER — Telehealth: Payer: Self-pay

## 2014-02-14 DIAGNOSIS — E119 Type 2 diabetes mellitus without complications: Secondary | ICD-10-CM

## 2014-02-14 DIAGNOSIS — E785 Hyperlipidemia, unspecified: Secondary | ICD-10-CM

## 2014-02-14 NOTE — Telephone Encounter (Signed)
Diabetic Bundle- pt informed and voiced understanding of needing labs to check LDL and A1C. Pt states she will be calling back to schedule an appt soon because there is some other things she would like to discuss with Melissa.  Labs ordered

## 2014-02-21 ENCOUNTER — Encounter: Payer: Self-pay | Admitting: Physician Assistant

## 2014-02-21 ENCOUNTER — Ambulatory Visit (INDEPENDENT_AMBULATORY_CARE_PROVIDER_SITE_OTHER): Payer: 59 | Admitting: Physician Assistant

## 2014-02-21 VITALS — BP 115/83 | HR 89 | Temp 98.7°F | Resp 16 | Ht 63.5 in | Wt 234.0 lb

## 2014-02-21 DIAGNOSIS — J0100 Acute maxillary sinusitis, unspecified: Secondary | ICD-10-CM

## 2014-02-21 MED ORDER — AZITHROMYCIN 250 MG PO TABS: ORAL_TABLET | ORAL | Status: DC

## 2014-02-21 NOTE — Progress Notes (Signed)
Patient presents to clinic c/o sinus pressure, left-sided sinus and facial pain, tooth pain and ear pain with noted nasal congestion.  Denies fever, chills, cough or SOB.  Denies recent travel or sick contact.   Past Medical History  Diagnosis Date  . Hypertension   . Hyperlipidemia   . History of TIA (transient ischemic attack) 2004?    Current Outpatient Prescriptions on File Prior to Visit  Medication Sig Dispense Refill  . albuterol (PROVENTIL HFA;VENTOLIN HFA) 108 (90 BASE) MCG/ACT inhaler Inhale 2 puffs into the lungs every 4 (four) hours as needed for wheezing or shortness of breath.  1 Inhaler  0  . cetirizine (ZYRTEC) 10 MG tablet Take 10 mg by mouth daily. Seasonal Allergies       No current facility-administered medications on file prior to visit.    Allergies  Allergen Reactions  . Clarithromycin     REACTION: VOMITING  . Levofloxacin     REACTION: VOMITING  . Penicillins     REACTION: TONGUE SWELLING  . Promethazine Hcl   . Statins     Family History  Problem Relation Age of Onset  . Cancer Mother     lung  . Diabetes Mother   . Thalassemia Mother   . Thalassemia Sister   . Cancer Brother     lymphatic  . Thalassemia Maternal Aunt   . Heart disease Paternal Aunt     History   Social History  . Marital Status: Married    Spouse Name: N/A    Number of Children: N/A  . Years of Education: N/A   Social History Main Topics  . Smoking status: Former Smoker    Quit date: 09/12/2005  . Smokeless tobacco: Never Used  . Alcohol Use: None  . Drug Use: None  . Sexual Activity: None   Other Topics Concern  . None   Social History Narrative  . None   Review of Systems - See HPI.  All other ROS are negative.  BP 115/83  Pulse 89  Temp(Src) 98.7 F (37.1 C) (Oral)  Resp 16  Ht 5' 3.5" (1.613 m)  Wt 234 lb (106.142 kg)  BMI 40.80 kg/m2  SpO2 97%  Physical Exam  Vitals reviewed. Constitutional: She is oriented to person, place, and time and  well-developed, well-nourished, and in no distress.  HENT:  Head: Normocephalic and atraumatic.  Right Ear: External ear normal.  Left Ear: External ear normal.  Nose: Nose normal.  Mouth/Throat: Oropharynx is clear and moist. No oropharyngeal exudate.  TM within normal limits bilaterally. + TTP of left frontal and maxillary sinuses.  Eyes: Conjunctivae are normal. Pupils are equal, round, and reactive to light.  Cardiovascular: Normal rate, regular rhythm, normal heart sounds and intact distal pulses.   Pulmonary/Chest: Effort normal and breath sounds normal. No respiratory distress. She has no wheezes. She has no rales. She exhibits no tenderness.  Neurological: She is alert and oriented to person, place, and time.  Skin: Skin is warm and dry. No rash noted.  Psychiatric: Affect normal.   Assessment/Plan: Sinusitis Rx Azithromycin.  Increase fluid intake.  Rest.  Saline nasal spray. Plain Mucinex.  Continue zyrtec.  Call or return if symptoms are not improving.

## 2014-02-21 NOTE — Progress Notes (Signed)
Pre visit review using our clinic review tool, if applicable. No additional management support is needed unless otherwise documented below in the visit note/SLS  

## 2014-02-21 NOTE — Assessment & Plan Note (Signed)
Rx Azithromycin.  Increase fluid intake.  Rest.  Saline nasal spray. Plain Mucinex.  Continue zyrtec.  Call or return if symptoms are not improving.

## 2014-02-21 NOTE — Patient Instructions (Signed)
Please take antibiotic as directed.  Increase fluid intake.  Use Saline nasal spray.  Take a daily multivitamin. Continue zyrtec.  Plain Mucinex.  Please call or return clinic if symptoms are not improving.  Sinusitis Sinusitis is redness, soreness, and swelling (inflammation) of the paranasal sinuses. Paranasal sinuses are air pockets within the bones of your face (beneath the eyes, the middle of the forehead, or above the eyes). In healthy paranasal sinuses, mucus is able to drain out, and air is able to circulate through them by way of your nose. However, when your paranasal sinuses are inflamed, mucus and air can become trapped. This can allow bacteria and other germs to grow and cause infection. Sinusitis can develop quickly and last only a short time (acute) or continue over a long period (chronic). Sinusitis that lasts for more than 12 weeks is considered chronic.  CAUSES  Causes of sinusitis include:  Allergies.  Structural abnormalities, such as displacement of the cartilage that separates your nostrils (deviated septum), which can decrease the air flow through your nose and sinuses and affect sinus drainage.  Functional abnormalities, such as when the small hairs (cilia) that line your sinuses and help remove mucus do not work properly or are not present. SYMPTOMS  Symptoms of acute and chronic sinusitis are the same. The primary symptoms are pain and pressure around the affected sinuses. Other symptoms include:  Upper toothache.  Earache.  Headache.  Bad breath.  Decreased sense of smell and taste.  A cough, which worsens when you are lying flat.  Fatigue.  Fever.  Thick drainage from your nose, which often is green and may contain pus (purulent).  Swelling and warmth over the affected sinuses. DIAGNOSIS  Your caregiver will perform a physical exam. During the exam, your caregiver may:  Look in your nose for signs of abnormal growths in your nostrils (nasal  polyps).  Tap over the affected sinus to check for signs of infection.  View the inside of your sinuses (endoscopy) with a special imaging device with a light attached (endoscope), which is inserted into your sinuses. If your caregiver suspects that you have chronic sinusitis, one or more of the following tests may be recommended:  Allergy tests.  Nasal culture A sample of mucus is taken from your nose and sent to a lab and screened for bacteria.  Nasal cytology A sample of mucus is taken from your nose and examined by your caregiver to determine if your sinusitis is related to an allergy. TREATMENT  Most cases of acute sinusitis are related to a viral infection and will resolve on their own within 10 days. Sometimes medicines are prescribed to help relieve symptoms (pain medicine, decongestants, nasal steroid sprays, or saline sprays).  However, for sinusitis related to a bacterial infection, your caregiver will prescribe antibiotic medicines. These are medicines that will help kill the bacteria causing the infection.  Rarely, sinusitis is caused by a fungal infection. In theses cases, your caregiver will prescribe antifungal medicine. For some cases of chronic sinusitis, surgery is needed. Generally, these are cases in which sinusitis recurs more than 3 times per year, despite other treatments. HOME CARE INSTRUCTIONS   Drink plenty of water. Water helps thin the mucus so your sinuses can drain more easily.  Use a humidifier.  Inhale steam 3 to 4 times a day (for example, sit in the bathroom with the shower running).  Apply a warm, moist washcloth to your face 3 to 4 times a day, or as  directed by your caregiver.  Use saline nasal sprays to help moisten and clean your sinuses.  Take over-the-counter or prescription medicines for pain, discomfort, or fever only as directed by your caregiver. SEEK IMMEDIATE MEDICAL CARE IF:  You have increasing pain or severe headaches.  You have  nausea, vomiting, or drowsiness.  You have swelling around your face.  You have vision problems.  You have a stiff neck.  You have difficulty breathing. MAKE SURE YOU:   Understand these instructions.  Will watch your condition.  Will get help right away if you are not doing well or get worse. Document Released: 07/22/2005 Document Revised: 10/14/2011 Document Reviewed: 08/06/2011 [facility] Patient Information 2014 [place], [place].

## 2014-04-04 ENCOUNTER — Other Ambulatory Visit: Payer: Self-pay | Admitting: Internal Medicine

## 2014-04-04 DIAGNOSIS — IMO0001 Reserved for inherently not codable concepts without codable children: Secondary | ICD-10-CM

## 2014-04-04 DIAGNOSIS — E1165 Type 2 diabetes mellitus with hyperglycemia: Principal | ICD-10-CM

## 2014-04-05 ENCOUNTER — Other Ambulatory Visit (INDEPENDENT_AMBULATORY_CARE_PROVIDER_SITE_OTHER): Payer: 59

## 2014-04-05 DIAGNOSIS — IMO0001 Reserved for inherently not codable concepts without codable children: Secondary | ICD-10-CM

## 2014-04-05 DIAGNOSIS — E1165 Type 2 diabetes mellitus with hyperglycemia: Principal | ICD-10-CM

## 2014-04-05 LAB — BASIC METABOLIC PANEL
BUN: 18 mg/dL (ref 6–23)
CALCIUM: 9.3 mg/dL (ref 8.4–10.5)
CO2: 27 meq/L (ref 19–32)
Chloride: 107 mEq/L (ref 96–112)
Creatinine, Ser: 0.9 mg/dL (ref 0.4–1.2)
GFR: 71.62 mL/min (ref >60.00)
GLUCOSE: 106 mg/dL — AB (ref 70–99)
Potassium: 4 mEq/L (ref 3.5–5.1)
Sodium: 141 mEq/L (ref 135–145)

## 2014-04-05 LAB — HEMOGLOBIN A1C: Hgb A1c MFr Bld: 6 % (ref 4.6–6.5)

## 2014-04-06 LAB — LIPID PANEL WITH LDL/HDL RATIO
CHOLESTEROL TOTAL: 248 mg/dL — AB (ref 100–199)
HDL: 48 mg/dL (ref >39)
LDL CALC: 171 mg/dL — AB (ref 0–99)
LDL/HDL RATIO: 3.6 ratio — AB (ref 0.0–3.2)
Triglycerides: 147 mg/dL (ref 0–149)
VLDL Cholesterol Cal: 29 mg/dL (ref 5–40)

## 2014-04-08 ENCOUNTER — Encounter: Payer: Self-pay | Admitting: Internal Medicine

## 2014-04-08 ENCOUNTER — Ambulatory Visit (INDEPENDENT_AMBULATORY_CARE_PROVIDER_SITE_OTHER): Payer: 59 | Admitting: Internal Medicine

## 2014-04-08 ENCOUNTER — Ambulatory Visit (INDEPENDENT_AMBULATORY_CARE_PROVIDER_SITE_OTHER)
Admission: RE | Admit: 2014-04-08 | Discharge: 2014-04-08 | Disposition: A | Discharge status: Home / Self Care | Payer: 59 | Source: Ambulatory Visit | Attending: Internal Medicine | Admitting: Internal Medicine

## 2014-04-08 VITALS — BP 122/88 | HR 84 | Temp 98.4°F | Resp 22 | Ht 62.5 in | Wt 235.4 lb

## 2014-04-08 DIAGNOSIS — E785 Hyperlipidemia, unspecified: Secondary | ICD-10-CM

## 2014-04-08 DIAGNOSIS — IMO0002 Reserved for concepts with insufficient information to code with codable children: Secondary | ICD-10-CM

## 2014-04-08 DIAGNOSIS — I10 Essential (primary) hypertension: Secondary | ICD-10-CM

## 2014-04-08 DIAGNOSIS — M542 Cervicalgia: Secondary | ICD-10-CM

## 2014-04-08 DIAGNOSIS — G609 Hereditary and idiopathic neuropathy, unspecified: Secondary | ICD-10-CM

## 2014-04-08 DIAGNOSIS — E119 Type 2 diabetes mellitus without complications: Secondary | ICD-10-CM

## 2014-04-08 DIAGNOSIS — Z0000 Encounter for general adult medical examination without abnormal findings: Secondary | ICD-10-CM

## 2014-04-08 DIAGNOSIS — M792 Neuralgia and neuritis, unspecified: Secondary | ICD-10-CM

## 2014-04-08 MED ORDER — GABAPENTIN 100 MG PO CAPS: 100.0000 mg | ORAL_CAPSULE | Freq: Two times a day (BID) | ORAL | Status: DC

## 2014-04-08 NOTE — Patient Instructions (Signed)
We will have you go down to x-ray to get your neck checked out.   We have sent in a medicine for the burning in your feet called gabapentin. Take 1 pill in the morning for the first 3 days then you can take 1 pill in the morning and 1 pill in the afternoon.   One way to reduce calories in your food is to limit the serving sizes of your foods and to work on exercising for about 30 minutes 5 times per week.   Come back in about 6-12 months if you are doing well and sooner if you are having problems. Think about rescheduling your colonoscopy as this would be good to do once.   Serving Sizes What we call a serving size today is larger than it was in the past. A 1950s fast-food burger contained little more than 1 oz of meat, and a soft drink was 8 oz (1 cup). Today, a "quarter pounder" burger is at least 4 times that amount, and a 32 or 64 oz drink is not uncommon. A possible guide for eating when trying to lose weight is to eat about half as much as you normally do. Some estimates of serving sizes are:  1 Dairy serving:Individual container of yogurt (8 oz) or piece of cheese the size of your thumb (1 oz).  1 Grain serving: 1 slice of bread or  cup pasta.  1 Meat serving: The size of a deck of cards (3 oz).  1 Fruit serving: cup canned fruit or 1 medium fruit.  1 Vegetable serving:  cup of cooked or canned vegetables.  1 Fat serving:The size of 4 stacked dimes. Experts suggest spending 1 or 2 days measuring food portions you commonly eat. This will give you better practice at estimating serving sizes, and will also show whether you are eating an appropriate amount of food to meet your weight goals. If you find that you are eating more than you thought, try measuring your food for a few days so you can "reprogram" yourself to learn what makes a healthy portion for you. SUGGESTIONS FOR CONTROL  In restaurants, share entrees, or ask the waiter to put half the entre in a box or bag before you  even touch it.  Order lunch-sized portions. Many restaurants serve 4 to 6 oz of meat at lunch, compared with 8 to 10 oz at dinner.  Split dessert or skip it all together. Have a piece of fruit when you get home.  At home, use smaller plates and bowls. It will look as if you are eating more.  Plate your food in the kitchen rather than serving it "family style" at the table.  Wait 20 to 30 minutes before taking seconds. This is how long it takes your brain to recognize that you are full.  Check food labels for serving sizes. Eat 1 serving only.  Use measuring cups and spoons to see proper serving sizes.  Buy smaller packages of candy, popcorn, and snacks.  Avoid eating directly out of the bag or carton.  While eating half as much, exercise twice as much. Park further away from the mall, take the stairs instead of the escalator, and walk around your block. Losing weight is a slow, difficult process. It takes long-lasting lifestyle changes. You can make gradual changes over time so they become habits. Look to friends and family to support the healthy changes you are making. Avoid fad diets since they are often only temporary weight loss  solutions. Document Released: 04/20/2003 Document Revised: 10/14/2011 Document Reviewed: 10/19/2013 [facility] Patient Information 2015 [place], [place]. This information is not intended to replace advice given to you by your health care provider. Make sure you discuss any questions you have with your health care provider.

## 2014-04-08 NOTE — Progress Notes (Signed)
Pre visit review using our clinic review tool, if applicable. No additional management support is needed unless otherwise documented below in the visit note. 

## 2014-04-11 DIAGNOSIS — M542 Cervicalgia: Secondary | ICD-10-CM | POA: Insufficient documentation

## 2014-04-11 NOTE — Assessment & Plan Note (Signed)
Patient is currently controlled with diet and encouraged her to maintain exercise and work on losing about 10 pounds to keep from becoming a diabetic. Foot exam done and normal today.

## 2014-04-11 NOTE — Assessment & Plan Note (Signed)
Filed Vitals:   04/08/14 1458  BP: 122/88  Pulse: 84  Temp: 98.4 F (36.9 C)  TempSrc: Oral  Resp: 22  Height: 5' 2.5" (1.588 m)  Weight: 235 lb 6.4 oz (106.777 kg)  SpO2: 96%   BP currently controlled off medication and will monitor at visits.

## 2014-04-11 NOTE — Assessment & Plan Note (Signed)
Patient declined the flu shot as she used to be a Marine scientist and does not feel that she has exposure to justify it at this time. Spoke with her about colonoscopy and she will think about it and we will discuss at next visit. She is nervous about it but may try to do it.

## 2014-04-11 NOTE — Progress Notes (Signed)
   Subjective:    Patient ID: [name], female    DOB: April 01, 1956, 58 y.o.   MRN: [id number]  Neck Pain  Pertinent negatives include no chest pain, fever, headaches, numbness, trouble swallowing or weakness.   The patient is a 58 YO woman with PMH of hyperlipidemia, hypertension who is coming in today to establish with new provider and for some neck stiffness. She has noticed upon awakening the last several weeks that her neck is stiff and painful to turn. She has not done any new activities that she can recall. She did not injure the neck. She does have history of rotator cuff injury on the left and had surgery on it in the past. She has had some pains with tingling around her neck. No headache and no chest pains. The pains do not run along her arm. She has had not problems with it before. She is also having some burning in her feet and would like help with that. She is concerned about diabetes and wants to check her labs. She has a bunion on her right foot that she thinks she should get looked at and had one fixed on the left foot. She denies breathing problems, abdominal pain, constipation, diarrhea.   Review of Systems  Constitutional: Negative for fever, chills, activity change, appetite change, fatigue and unexpected weight change.  HENT: Negative for congestion, nosebleeds, postnasal drip, rhinorrhea, sinus pressure and trouble swallowing.   Respiratory: Negative for cough, chest tightness, shortness of breath and wheezing.   Cardiovascular: Negative for chest pain, palpitations and leg swelling.  Gastrointestinal: Negative for abdominal pain, diarrhea, constipation and abdominal distention.  Musculoskeletal: Positive for neck pain. Negative for arthralgias, back pain, gait problem and myalgias.  Skin: Negative for color change and wound.  Neurological: Negative for dizziness, syncope, facial asymmetry, speech difficulty, weakness, light-headedness, numbness and headaches.         Objective:   Physical Exam  Constitutional: She is oriented to person, place, and time. She appears well-developed and well-nourished.  Obese  HENT:  Head: Normocephalic and atraumatic.  Eyes: EOM are normal.  Neck: Neck supple. No JVD present. No tracheal deviation present. No thyromegaly present.  ROM limited by pain, no tenderness along the cervical spine but tenderness in the paraspinal region and mildly in the shoulders bilaterally.  Cardiovascular: Normal rate and regular rhythm.   No murmur heard. Pulmonary/Chest: Effort normal and breath sounds normal. No respiratory distress. She has no wheezes. She has no rales. She exhibits no tenderness.  Abdominal: Soft. Bowel sounds are normal. She exhibits no distension. There is no tenderness. There is no rebound.  Musculoskeletal: She exhibits tenderness. She exhibits no edema.  Lymphadenopathy:    She has no cervical adenopathy.  Neurological: She is alert and oriented to person, place, and time. Coordination normal.  Skin: Skin is warm and dry.      Assessment & Plan:

## 2014-04-11 NOTE — Assessment & Plan Note (Signed)
LDL most recent 171 and she would not like to trial medication at this time due to concern about muscle aches. She will work on improving her diet and losing some weight with exercise.

## 2014-04-11 NOTE — Assessment & Plan Note (Signed)
The patient does have history of shoulder problems with recent onset of neck stiffness and pain with motion. Talked to her about stretching and she was insistent on x-ray of her neck to check for arthritis. Advised her that we would not be able to see muscular or tendon problems and could only exclude fracture and see if severe arthritis. 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 Unclear if this is the cause of pain and doubt.   Will encourage stretching and heat/ice for now. May need to change pillow or sleeping position.

## 2014-04-11 NOTE — Assessment & Plan Note (Signed)
Patient will trial gabapentin 100 mg qmorning for 3 days then bid for neural sounding pain in her feet.

## 2014-04-13 ENCOUNTER — Ambulatory Visit: Payer: 59 | Admitting: Internal Medicine

## 2014-04-14 ENCOUNTER — Ambulatory Visit: Payer: 59 | Admitting: Internal Medicine

## 2014-04-14 DIAGNOSIS — R4689 Other symptoms and signs involving appearance and behavior: Secondary | ICD-10-CM | POA: Insufficient documentation

## 2014-04-14 DIAGNOSIS — Z0289 Encounter for other administrative examinations: Secondary | ICD-10-CM

## 2014-04-30 ENCOUNTER — Ambulatory Visit (INDEPENDENT_AMBULATORY_CARE_PROVIDER_SITE_OTHER): Payer: 59 | Admitting: Family Medicine

## 2014-04-30 ENCOUNTER — Encounter: Payer: Self-pay | Admitting: Family Medicine

## 2014-04-30 VITALS — BP 130/78 | Temp 98.6°F | Wt 234.0 lb

## 2014-04-30 DIAGNOSIS — H9202 Otalgia, left ear: Secondary | ICD-10-CM

## 2014-04-30 DIAGNOSIS — H9209 Otalgia, unspecified ear: Secondary | ICD-10-CM

## 2014-04-30 MED ORDER — AZITHROMYCIN 250 MG PO TABS: ORAL_TABLET | ORAL | Status: DC

## 2014-04-30 NOTE — Patient Instructions (Signed)
Ears are looking ok today.  Given your history, I will give antibiotic to hold on to in case turning into ear infection. Push fluids ,tylenol, start flonase. If recurrent ear infections, I would recommend return to ENT.

## 2014-04-30 NOTE — Assessment & Plan Note (Signed)
Overall benign exam today, anticipate beginnings of URI.  Reviewed her recurrent infections over the past year - seen at our offices four times in 2015, has received zpack 3 times including today. Discussed left nasal passage narrowing noted on exam today - pt attributes to h/o rhinoplasty, suggested start flonase, suggested return to see ENT given recurrent sinus/ear infections, pt will consider and let us know if decides to do this. Pt invested in Rx for antibiotic today - so provided with WASP prescription given her history of recurrent ear and sinus infections in the past. Allergy to several abx, has tolerated azithromycin well in past. She is an Therapist, sports, discussed in detail concerns with over treatment with antibiotic leading to antibiotic resistance, abx side effects discussed, discussed normal progression of URIs and how most of them are viral. Advised she give current earache more time to see if will resolve on its own, and if developing into AOM with sxs of fever or worsening pain to fill zpack. Pt agrees with plan.

## 2014-04-30 NOTE — Progress Notes (Signed)
BP 130/78  Temp(Src) 98.6 F (37 C)  Wt 234 lb (106.142 kg)   CC: L ear pain  Subjective:    Patient ID: [name], female    DOB: 1956/06/17, 58 y.o.   MRN: [id number]  HPI: [name] is a 58 y.o. female presenting on 04/30/2014 for possible ear infection   Last night L ear pain started. Tends to get URIs whenever she feels like this. + PNdrainage. H/o seasonal allergies on zyrtec. + h/o asthma - trigger is URIs.   Denies fevers/chills, cough, HA, PNdrainage, ST, congestion, rhinorrhea. "overall I feel well but know my body and will get sinus and ear infection if I don't start antibiotic today".  H/o recurrent sinus infections. zpack "works for me if I start it now". No sick contacts at home.  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 H/o rhinoplasty 60s  Past Medical History  Diagnosis Date  . Hypertension   . Hyperlipidemia   . History of TIA (transient ischemic attack) 2004?    Past Surgical History  Procedure Laterality Date  . Nasal septum surgery  1981  . Bunionectomy Left 2008    Dr [name]  . Shoulder surgery Left     Dr [name]. Rotator cuff repair  . Appendectomy  1979  . Tonsillectomy  1964  . Tubal ligation  1999  . Cesarean section  1986    Relevant past medical, surgical, family and social history reviewed and updated as indicated.  Allergies and medications reviewed and updated. Current Outpatient Prescriptions on File Prior to Visit  Medication Sig  . cetirizine (ZYRTEC) 10 MG tablet Take 10 mg by mouth daily. Seasonal Allergies  . gabapentin (NEURONTIN) 100 MG capsule Take 1 capsule (100 mg total) by mouth 2 (two) times daily. For first 3 days only take once per day.  . albuterol (PROVENTIL HFA;VENTOLIN HFA) 108 (90 BASE) MCG/ACT inhaler Inhale 2 puffs into the lungs every 4 (four) hours as needed for wheezing or shortness of breath.   No current facility-administered medications on file prior to visit.    Review of Systems Per HPI unless  specifically indicated above    Objective:    BP 130/78  Temp(Src) 98.6 F (37 C)  Wt 234 lb (106.142 kg)  Physical Exam  Nursing note and vitals reviewed. Constitutional: She appears well-developed and well-nourished. No distress.  HENT:  Head: Normocephalic and atraumatic.  Right Ear: Hearing, tympanic membrane, external ear and ear canal normal.  Left Ear: Hearing, tympanic membrane, external ear and ear canal normal.  Nose: Mucosal edema (L>R congestion) present. No rhinorrhea. Right sinus exhibits no maxillary sinus tenderness and no frontal sinus tenderness. Left sinus exhibits no maxillary sinus tenderness and no frontal sinus tenderness.  Mouth/Throat: Uvula is midline, oropharynx is clear and moist and mucous membranes are normal. No oropharyngeal exudate, posterior oropharyngeal edema, posterior oropharyngeal erythema or tonsillar abscesses.  Narrowing of L nasal passage evident Raw erythematous papules on right of posterior oropharynx  Eyes: Conjunctivae and EOM are normal. Pupils are equal, round, and reactive to light. No scleral icterus.  Neck: Normal range of motion. Neck supple.  Cardiovascular: Normal rate, regular rhythm, normal heart sounds and intact distal pulses.   No murmur heard. Pulmonary/Chest: Effort normal and breath sounds normal. No respiratory distress. She has no wheezes. She has no rales.  Lymphadenopathy:    She has no cervical adenopathy.  Skin: Skin is warm and dry. No rash noted.  Assessment & Plan:   Problem List Items Addressed This Visit   Left ear pain - Primary     Overall benign exam today, anticipate beginnings of URI.  Reviewed her recurrent infections over the past year - seen at our offices four times in 2015, has received zpack 3 times including today. Discussed left nasal passage narrowing noted on exam today - pt attributes to h/o rhinoplasty, suggested start flonase, suggested return to see ENT given recurrent sinus/ear  infections, pt will consider and let us know if decides to do this. Pt invested in Rx for antibiotic today - so provided with WASP prescription given her history of recurrent ear and sinus infections in the past. Allergy to several abx, has tolerated azithromycin well in past. She is an Therapist, sports, discussed in detail concerns with over treatment with antibiotic leading to antibiotic resistance, abx side effects discussed, discussed normal progression of URIs and how most of them are viral. Advised she give current earache more time to see if will resolve on its own, and if developing into AOM with sxs of fever or worsening pain to fill zpack. Pt agrees with plan.        Follow up plan: Return if symptoms worsen or fail to improve.

## 2014-05-05 DIAGNOSIS — M2011 Hallux valgus (acquired), right foot: Secondary | ICD-10-CM

## 2014-05-05 HISTORY — DX: Hallux valgus (acquired), right foot: M20.11

## 2014-05-20 ENCOUNTER — Encounter (HOSPITAL_BASED_OUTPATIENT_CLINIC_OR_DEPARTMENT_OTHER): Payer: Self-pay | Admitting: *Deleted

## 2014-05-25 ENCOUNTER — Other Ambulatory Visit: Payer: Self-pay | Admitting: Physician Assistant

## 2014-05-26 ENCOUNTER — Encounter (HOSPITAL_BASED_OUTPATIENT_CLINIC_OR_DEPARTMENT_OTHER): Payer: Self-pay

## 2014-05-26 ENCOUNTER — Ambulatory Visit (HOSPITAL_BASED_OUTPATIENT_CLINIC_OR_DEPARTMENT_OTHER): Payer: 59 | Admitting: Certified Registered"

## 2014-05-26 ENCOUNTER — Encounter (HOSPITAL_BASED_OUTPATIENT_CLINIC_OR_DEPARTMENT_OTHER): Payer: 59 | Admitting: Certified Registered"

## 2014-05-26 ENCOUNTER — Ambulatory Visit (HOSPITAL_BASED_OUTPATIENT_CLINIC_OR_DEPARTMENT_OTHER)
Admission: RE | Admit: 2014-05-26 | Discharge: 2014-05-26 | Disposition: A | Discharge status: Home / Self Care | Payer: 59 | Source: Ambulatory Visit | Attending: Orthopedic Surgery | Admitting: Orthopedic Surgery

## 2014-05-26 ENCOUNTER — Encounter (HOSPITAL_BASED_OUTPATIENT_CLINIC_OR_DEPARTMENT_OTHER)
Admission: RE | Disposition: A | Discharge status: Home / Self Care | Payer: Self-pay | Source: Ambulatory Visit | Attending: Orthopedic Surgery

## 2014-05-26 DIAGNOSIS — Z880 Allergy status to penicillin: Secondary | ICD-10-CM | POA: Insufficient documentation

## 2014-05-26 DIAGNOSIS — Z881 Allergy status to other antibiotic agents status: Secondary | ICD-10-CM | POA: Insufficient documentation

## 2014-05-26 DIAGNOSIS — J45909 Unspecified asthma, uncomplicated: Secondary | ICD-10-CM | POA: Diagnosis not present

## 2014-05-26 DIAGNOSIS — M1389 Other specified arthritis, multiple sites: Secondary | ICD-10-CM | POA: Diagnosis not present

## 2014-05-26 DIAGNOSIS — Z888 Allergy status to other drugs, medicaments and biological substances status: Secondary | ICD-10-CM | POA: Diagnosis not present

## 2014-05-26 DIAGNOSIS — Q662 Congenital metatarsus (primus) varus: Secondary | ICD-10-CM | POA: Diagnosis not present

## 2014-05-26 DIAGNOSIS — M2669 Other specified disorders of temporomandibular joint: Secondary | ICD-10-CM | POA: Insufficient documentation

## 2014-05-26 DIAGNOSIS — M2011 Hallux valgus (acquired), right foot: Secondary | ICD-10-CM

## 2014-05-26 HISTORY — DX: Dental restoration status: Z98.811

## 2014-05-26 HISTORY — DX: Unspecified asthma, uncomplicated: J45.909

## 2014-05-26 HISTORY — DX: Stiffness of unspecified joint, not elsewhere classified: M25.60

## 2014-05-26 HISTORY — DX: Unspecified osteoarthritis, unspecified site: M19.90

## 2014-05-26 HISTORY — PX: BUNIONECTOMY: SHX129

## 2014-05-26 HISTORY — DX: Arthralgia of temporomandibular joint, unspecified side: M26.629

## 2014-05-26 HISTORY — DX: Hallux valgus (acquired), right foot: M20.11

## 2014-05-26 LAB — POCT HEMOGLOBIN-HEMACUE: Hemoglobin: 13.5 g/dL (ref 12.0–15.0)

## 2014-05-26 SURGERY — BUNIONECTOMY
Anesthesia: General | Site: Foot | Laterality: Right

## 2014-05-26 MED ORDER — OXYCODONE HCL 5 MG PO TABS
5.0000 mg | ORAL_TABLET | Freq: Once | ORAL | Status: AC | PRN
  Administered 2014-05-26: 5 mg via ORAL

## 2014-05-26 MED ORDER — SODIUM CHLORIDE 0.9 % IV SOLN: INTRAVENOUS | Status: DC

## 2014-05-26 MED ORDER — OXYCODONE HCL 5 MG PO TABS
ORAL_TABLET | ORAL | Status: AC
  Filled 2014-05-26: qty 1

## 2014-05-26 MED ORDER — OXYCODONE HCL 5 MG PO TABS
5.0000 mg | ORAL_TABLET | ORAL | Status: DC | PRN
Start: 2014-05-26 — End: 2014-10-15

## 2014-05-26 MED ORDER — 0.9 % SODIUM CHLORIDE (POUR BTL) OPTIME
TOPICAL | Status: DC | PRN
  Administered 2014-05-26: 200 mL

## 2014-05-26 MED ORDER — ACETAMINOPHEN 500 MG PO TABS
ORAL_TABLET | ORAL | Status: AC
  Filled 2014-05-26: qty 2

## 2014-05-26 MED ORDER — ACETAMINOPHEN 500 MG PO TABS
1000.0000 mg | ORAL_TABLET | Freq: Once | ORAL | Status: AC
  Administered 2014-05-26: 500 mg via ORAL

## 2014-05-26 MED ORDER — CLINDAMYCIN PHOSPHATE 900 MG/50ML IV SOLN
INTRAVENOUS | Status: AC
  Filled 2014-05-26: qty 50

## 2014-05-26 MED ORDER — CHLORHEXIDINE GLUCONATE 4 % EX LIQD: 60.0000 mL | Freq: Once | CUTANEOUS | Status: DC

## 2014-05-26 MED ORDER — SODIUM CHLORIDE 0.9 % IV SOLN
INTRAVENOUS | Status: DC | PRN
  Administered 2014-05-26: 11:00:00

## 2014-05-26 MED ORDER — LACTATED RINGERS IV SOLN
INTRAVENOUS | Status: DC
  Administered 2014-05-26 (×3): via INTRAVENOUS

## 2014-05-26 MED ORDER — FENTANYL CITRATE 0.05 MG/ML IJ SOLN: 50.0000 ug | INTRAMUSCULAR | Status: DC | PRN

## 2014-05-26 MED ORDER — HYDROMORPHONE HCL 1 MG/ML IJ SOLN
INTRAMUSCULAR | Status: AC
  Filled 2014-05-26: qty 1

## 2014-05-26 MED ORDER — MIDAZOLAM HCL 5 MG/5ML IJ SOLN
INTRAMUSCULAR | Status: DC | PRN
  Administered 2014-05-26: 2 mg via INTRAVENOUS

## 2014-05-26 MED ORDER — DEXAMETHASONE SODIUM PHOSPHATE 10 MG/ML IJ SOLN
INTRAMUSCULAR | Status: DC | PRN
  Administered 2014-05-26: 10 mg via INTRAVENOUS

## 2014-05-26 MED ORDER — PROPOFOL 10 MG/ML IV BOLUS
INTRAVENOUS | Status: DC | PRN
  Administered 2014-05-26: 150 mg via INTRAVENOUS

## 2014-05-26 MED ORDER — PROPOFOL 10 MG/ML IV EMUL
INTRAVENOUS | Status: AC
  Filled 2014-05-26: qty 50

## 2014-05-26 MED ORDER — OXYCODONE HCL 5 MG/5ML PO SOLN: 5.0000 mg | Freq: Once | ORAL | Status: AC | PRN

## 2014-05-26 MED ORDER — ASPIRIN EC 325 MG PO TBEC: 325.0000 mg | DELAYED_RELEASE_TABLET | Freq: Every day | ORAL | Status: DC

## 2014-05-26 MED ORDER — MIDAZOLAM HCL 2 MG/2ML IJ SOLN: 1.0000 mg | INTRAMUSCULAR | Status: DC | PRN

## 2014-05-26 MED ORDER — BACITRACIN ZINC 500 UNIT/GM EX OINT
TOPICAL_OINTMENT | CUTANEOUS | Status: DC | PRN
  Administered 2014-05-26: 1 via TOPICAL

## 2014-05-26 MED ORDER — BUPIVACAINE LIPOSOME 1.3 % IJ SUSP
INTRAMUSCULAR | Status: AC
  Filled 2014-05-26: qty 20

## 2014-05-26 MED ORDER — MIDAZOLAM HCL 2 MG/ML PO SYRP: 12.0000 mg | ORAL_SOLUTION | Freq: Once | ORAL | Status: DC | PRN

## 2014-05-26 MED ORDER — BACITRACIN ZINC 500 UNIT/GM EX OINT
TOPICAL_OINTMENT | CUTANEOUS | Status: AC
  Filled 2014-05-26: qty 28.35

## 2014-05-26 MED ORDER — LIDOCAINE HCL (CARDIAC) 20 MG/ML IV SOLN
INTRAVENOUS | Status: DC | PRN
  Administered 2014-05-26: 60 mg via INTRAVENOUS

## 2014-05-26 MED ORDER — FENTANYL CITRATE 0.05 MG/ML IJ SOLN
INTRAMUSCULAR | Status: AC
  Filled 2014-05-26: qty 6

## 2014-05-26 MED ORDER — FENTANYL CITRATE 0.05 MG/ML IJ SOLN
INTRAMUSCULAR | Status: DC | PRN
  Administered 2014-05-26: 50 ug via INTRAVENOUS
  Administered 2014-05-26: 100 ug via INTRAVENOUS

## 2014-05-26 MED ORDER — ONDANSETRON HCL 4 MG/2ML IJ SOLN
INTRAMUSCULAR | Status: DC | PRN
  Administered 2014-05-26: 4 mg via INTRAVENOUS

## 2014-05-26 MED ORDER — CLINDAMYCIN PHOSPHATE 900 MG/50ML IV SOLN
900.0000 mg | INTRAVENOUS | Status: AC
  Administered 2014-05-26: 900 mg via INTRAVENOUS

## 2014-05-26 MED ORDER — MIDAZOLAM HCL 2 MG/2ML IJ SOLN
INTRAMUSCULAR | Status: AC
  Filled 2014-05-26: qty 2

## 2014-05-26 MED ORDER — HYDROMORPHONE HCL 1 MG/ML IJ SOLN
0.2500 mg | INTRAMUSCULAR | Status: DC | PRN
  Administered 2014-05-26 (×3): 0.5 mg via INTRAVENOUS

## 2014-05-26 SURGICAL SUPPLY — 82 items
BANDAGE ESMARK 6X9 LF (GAUZE/BANDAGES/DRESSINGS) ×1 IMPLANT
BIT DRILL 1.7 CANN W/AO CONN (BIT) IMPLANT
BIT DRILL 1.7 LOW PROFILE (BIT) IMPLANT
BLADE AVERAGE 25X9 (BLADE) IMPLANT
BLADE MICRO SAGITTAL (BLADE) ×2 IMPLANT
BLADE MINI RND TIP GREEN BEAV (BLADE) IMPLANT
BLADE OSC/SAG .038X5.5 CUT EDG (BLADE) IMPLANT
BLADE SURG 15 STRL LF DISP TIS (BLADE) ×2 IMPLANT
BLADE SURG 15 STRL SS (BLADE) ×2
BNDG COHESIVE 4X5 TAN STRL (GAUZE/BANDAGES/DRESSINGS) ×2 IMPLANT
BNDG COHESIVE 6X5 TAN STRL LF (GAUZE/BANDAGES/DRESSINGS) ×2 IMPLANT
BNDG CONFORM 2 STRL LF (GAUZE/BANDAGES/DRESSINGS) IMPLANT
BNDG CONFORM 3 STRL LF (GAUZE/BANDAGES/DRESSINGS) ×2 IMPLANT
BNDG ESMARK 4X9 LF (GAUZE/BANDAGES/DRESSINGS) IMPLANT
BNDG ESMARK 6X9 LF (GAUZE/BANDAGES/DRESSINGS) ×2
CHLORAPREP W/TINT 26ML (MISCELLANEOUS) ×2 IMPLANT
COVER BACK TABLE 60X90IN (DRAPES) ×2 IMPLANT
CUFF TOURNIQUET SINGLE 24IN (TOURNIQUET CUFF) IMPLANT
CUFF TOURNIQUET SINGLE 34IN LL (TOURNIQUET CUFF) ×2 IMPLANT
DRAPE EXTREMITY TIBURON (DRAPES) ×2 IMPLANT
DRAPE OEC MINIVIEW 54X84 (DRAPES) ×2 IMPLANT
DRAPE SURG 17X23 STRL (DRAPES) IMPLANT
DRAPE U-SHAPE 47X51 STRL (DRAPES) ×2 IMPLANT
DRSG EMULSION OIL 3X3 NADH (GAUZE/BANDAGES/DRESSINGS) ×2 IMPLANT
DRSG PAD ABDOMINAL 8X10 ST (GAUZE/BANDAGES/DRESSINGS) ×4 IMPLANT
ELECT REM PT RETURN 9FT ADLT (ELECTROSURGICAL) ×2
ELECTRODE REM PT RTRN 9FT ADLT (ELECTROSURGICAL) ×1 IMPLANT
GAUZE SPONGE 4X4 12PLY STRL (GAUZE/BANDAGES/DRESSINGS) ×2 IMPLANT
GLOVE BIO SURGEON STRL SZ7 (GLOVE) IMPLANT
GLOVE BIO SURGEON STRL SZ8 (GLOVE) ×2 IMPLANT
GLOVE BIOGEL PI IND STRL 7.0 (GLOVE) ×1 IMPLANT
GLOVE BIOGEL PI IND STRL 8 (GLOVE) ×1 IMPLANT
GLOVE BIOGEL PI INDICATOR 7.0 (GLOVE) ×1
GLOVE BIOGEL PI INDICATOR 8 (GLOVE) ×1
GLOVE ECLIPSE 6.5 STRL STRAW (GLOVE) ×2 IMPLANT
GLOVE EXAM NITRILE MD LF STRL (GLOVE) ×2 IMPLANT
GOWN STRL REUS W/ TWL LRG LVL3 (GOWN DISPOSABLE) ×1 IMPLANT
GOWN STRL REUS W/ TWL XL LVL3 (GOWN DISPOSABLE) ×1 IMPLANT
GOWN STRL REUS W/TWL LRG LVL3 (GOWN DISPOSABLE) ×1
GOWN STRL REUS W/TWL XL LVL3 (GOWN DISPOSABLE) ×1
GUIDEWIRE .08 (WIRE) IMPLANT
K-WIRE .045X4 (WIRE) IMPLANT
K-WIRE DBL END .054 LG (WIRE) IMPLANT
K-WIRE TROC 1.25X150 (WIRE) ×4
KWIRE TROC 1.25X150 (WIRE) ×2 IMPLANT
NEEDLE HYPO 22GX1.5 SAFETY (NEEDLE) ×2 IMPLANT
NEEDLE HYPO 25X1 1.5 SAFETY (NEEDLE) ×2 IMPLANT
NS IRRIG 1000ML POUR BTL (IV SOLUTION) ×2 IMPLANT
PACK BASIN DAY SURGERY FS (CUSTOM PROCEDURE TRAY) ×2 IMPLANT
PAD CAST 4YDX4 CTTN HI CHSV (CAST SUPPLIES) ×1 IMPLANT
PADDING CAST ABS 4INX4YD NS (CAST SUPPLIES)
PADDING CAST ABS COTTON 4X4 ST (CAST SUPPLIES) IMPLANT
PADDING CAST COTTON 4X4 STRL (CAST SUPPLIES) ×1
PADDING CAST COTTON 6X4 STRL (CAST SUPPLIES) ×2 IMPLANT
PENCIL BUTTON HOLSTER BLD 10FT (ELECTRODE) ×2 IMPLANT
PENCIL FOOT CONTROL (ELECTRODE) IMPLANT
SANITIZER HAND PURELL 535ML FO (MISCELLANEOUS) ×2 IMPLANT
SCREW CANN 4.0X38MM (Screw) ×2 IMPLANT
SCREW CANN PT 4.0X34 (Screw) ×2 IMPLANT
SHEET MEDIUM DRAPE 40X70 STRL (DRAPES) ×2 IMPLANT
SLEEVE SCD COMPRESS KNEE MED (MISCELLANEOUS) ×2 IMPLANT
SPLINT FAST PLASTER 5X30 (CAST SUPPLIES) ×20
SPLINT PLASTER CAST FAST 5X30 (CAST SUPPLIES) ×20 IMPLANT
SPONGE LAP 18X18 X RAY DECT (DISPOSABLE) ×2 IMPLANT
STOCKINETTE 6  STRL (DRAPES) ×1
STOCKINETTE 6 STRL (DRAPES) ×1 IMPLANT
SUCTION FRAZIER TIP 10 FR DISP (SUCTIONS) ×2 IMPLANT
SUT ETHILON 3 0 FSL (SUTURE) IMPLANT
SUT ETHILON 3 0 PS 1 (SUTURE) ×2 IMPLANT
SUT ETHILON 4 0 PS 2 18 (SUTURE) IMPLANT
SUT MNCRL AB 3-0 PS2 18 (SUTURE) ×2 IMPLANT
SUT VIC AB 0 SH 27 (SUTURE) IMPLANT
SUT VIC AB 2-0 SH 27 (SUTURE) ×1
SUT VIC AB 2-0 SH 27XBRD (SUTURE) ×1 IMPLANT
SUT VICRYL 4-0 PS2 18IN ABS (SUTURE) IMPLANT
SYR BULB 3OZ (MISCELLANEOUS) ×2 IMPLANT
SYR CONTROL 10ML LL (SYRINGE) ×2 IMPLANT
TOWEL OR 17X24 6PK STRL BLUE (TOWEL DISPOSABLE) ×4 IMPLANT
TOWEL OR NON WOVEN STRL DISP B (DISPOSABLE) IMPLANT
TUBE CONNECTING 20X1/4 (TUBING) ×2 IMPLANT
UNDERPAD 30X30 INCONTINENT (UNDERPADS AND DIAPERS) ×2 IMPLANT
YANKAUER SUCT BULB TIP NO VENT (SUCTIONS) IMPLANT

## 2014-05-26 NOTE — Anesthesia Preprocedure Evaluation (Addendum)
Anesthesia Evaluation  Patient identified by MRN, date of birth, ID band Patient awake    Reviewed: Allergy & Precautions, H&P , NPO status , Patient's Chart, lab work & pertinent test results  Airway Mallampati: II TM Distance: >3 FB Neck ROM: Full    Dental no notable dental hx. (+) Teeth Intact, Dental Advisory Given   Pulmonary neg pulmonary ROS, asthma , former smoker,  breath sounds clear to auscultation  Pulmonary exam normal       Cardiovascular negative cardio ROS  Rhythm:Regular Rate:Normal     Neuro/Psych negative neurological ROS  negative psych ROS   GI/Hepatic negative GI ROS, Neg liver ROS,   Endo/Other  Morbid obesity  Renal/GU negative Renal ROS  negative genitourinary   Musculoskeletal  (+) Arthritis -, Osteoarthritis,    Abdominal   Peds  Hematology negative hematology ROS (+)   Anesthesia Other Findings   Reproductive/Obstetrics negative OB ROS                          Anesthesia Physical Anesthesia Plan  ASA: III  Anesthesia Plan: General   Post-op Pain Management:    Induction: Intravenous  Airway Management Planned: LMA  Additional Equipment:   Intra-op Plan:   Post-operative Plan: Extubation in OR  Informed Consent: I have reviewed the patients History and Physical, chart, labs and discussed the procedure including the risks, benefits and alternatives for the proposed anesthesia with the patient or authorized representative who has indicated his/her understanding and acceptance.   Dental advisory given  Plan Discussed with: CRNA  Anesthesia Plan Comments:         Anesthesia Quick Evaluation

## 2014-05-26 NOTE — Anesthesia Postprocedure Evaluation (Signed)
  Anesthesia Post-op Note  Patient: [name]  Procedure(s) Performed: Procedure(s): RIGHT FOOT LAPIDUS BUNION CORRECTION AMD MODIFIED MCBRIDE BUNIONECTOMY (Right)  Patient Location: PACU  Anesthesia Type:General  Level of Consciousness: awake and alert   Airway and Oxygen Therapy: Patient Spontanous Breathing  Post-op Pain: mild  Post-op Assessment: Post-op Vital signs reviewed, Patient's Cardiovascular Status Stable and Respiratory Function Stable  Post-op Vital Signs: Reviewed  Filed Vitals:   05/26/14 1215  BP: 144/95  Pulse: 74  Temp:   Resp: 14    Complications: No apparent anesthesia complications

## 2014-05-26 NOTE — Discharge Instructions (Signed)
Sara Simmer, MD [place]  Please read the following information regarding your care after surgery.  Medications  You only need a prescription for the narcotic pain medicine (ex. oxycodone, Percocet, Norco).  All of the other medicines listed below are available over the counter. X acetominophen (Tylenol) 650 mg every 4-6 hours as you need for minor pain X oxycodone as prescribed for moderate to severe pain ?   Narcotic pain medicine (ex. oxycodone, Percocet, Vicodin) will cause constipation.  To prevent this problem, take the following medicines while you are taking any pain medicine. X docusate sodium (Colace) 100 mg twice a day X senna (Senokot) 2 tablets twice a day  X To help prevent blood clots, take an aspirin (325 mg) once a day for a month after surgery.  You should also get up every hour while you are awake to move around.    Weight Bearing ? Bear weight when you are able on your operated leg or foot. ? Bear weight only on the heel of your operated foot in the post-op shoe. X Do not bear any weight on the operated leg or foot.  Cast / Splint / Dressing X Keep your splint or cast clean and dry.  Dont put anything (coat hanger, pencil, etc) down inside of it.  If it gets damp, use a hair dryer on the cool setting to dry it.  If it gets soaked, call the office to schedule an appointment for a cast change. ? Remove your dressing 3 days after surgery and cover the incisions with dry dressings.    After your dressing, cast or splint is removed; you may shower, but do not soak or scrub the wound.  Allow the water to run over it, and then gently pat it dry.  Swelling It is normal for you to have swelling where you had surgery.  To reduce swelling and pain, keep your toes above your nose for at least 3 days after surgery.  It may be necessary to keep your foot or leg elevated for several weeks.  If it hurts, it should be elevated.  Follow Up Call my office at  [phone] when you are discharged from the hospital or surgery center to schedule an appointment to be seen two weeks after surgery.  Call my office at [phone] if you develop a fever >101.5 F, nausea, vomiting, bleeding from the surgical site or severe pain.    Information for Discharge Teaching: EXPAREL (bupivacaine liposome injectable suspension)   Your surgeon gave you EXPAREL(bupivacaine) in your surgical incision to help control your pain after surgery.   EXPAREL is a local anesthetic that provides pain relief by numbing the tissue around the surgical site.  EXPAREL is designed to release pain medication over time and can control pain for up to 72 hours.  Depending on how you respond to EXPAREL, you may require less pain medication during your recovery.  Possible side effects:  Temporary loss of sensation or ability to move in the area where bupivacaine was injected.  Nausea, vomiting, constipation  Rarely, numbness and tingling in your mouth or lips, lightheadedness, or anxiety may occur.  Call your doctor right away if you think you may be experiencing any of these sensations, or if you have other questions regarding possible side effects.  Follow all other discharge instructions given to you by your surgeon or nurse. Eat a healthy diet and drink plenty of water or other fluids.  If you return to the hospital for any  reason within 96 hours following the administration of EXPAREL, please inform your health care providers.   Post Anesthesia Home Care Instructions  Activity: Get plenty of rest for the remainder of the day. A responsible adult should stay with you for 24 hours following the procedure.  For the next 24 hours, DO NOT: -Drive a car -Paediatric nurse -Drink alcoholic beverages -Take any medication unless instructed by your physician -Make any legal decisions or sign important papers.  Meals: Start with liquid foods such as gelatin or soup. Progress  to regular foods as tolerated. Avoid greasy, spicy, heavy foods. If nausea and/or vomiting occur, drink only clear liquids until the nausea and/or vomiting subsides. Call your physician if vomiting continues.  Special Instructions/Symptoms: Your throat may feel dry or sore from the anesthesia or the breathing tube placed in your throat during surgery. If this causes discomfort, gargle with warm salt water. The discomfort should disappear within 24 hours.

## 2014-05-26 NOTE — Brief Op Note (Signed)
05/26/2014  11:04 AM  PATIENT:  [name]  58 y.o. female  PRE-OPERATIVE DIAGNOSIS:  Right foot hallux valgus  POST-OPERATIVE DIAGNOSIS:  Right foot hallux valgus  Procedure(s): 1.  RIGHT FOOT LAPIDUS BUNION CORRECTION 2.  Right MODIFIED MCBRIDE BUNIONECTOMY 3.  Right foot AP, lateral and oblique radiographs  SURGEON:  Wylene Simmer, MD  ASSISTANT: n/a  ANESTHESIA:   General  EBL:  minimal   TOURNIQUET:   Total Tourniquet Time Documented: Thigh (Right) - 56 minutes Total: Thigh (Right) - 56 minutes  COMPLICATIONS:  None apparent  DISPOSITION:  Extubated, awake and stable to recovery.  DICTATION ID:  [id number]

## 2014-05-26 NOTE — H&P (Signed)
[name] is an 58 y.o. female.   Chief Complaint: right foot painful bunion HPI:  58 y/o female with right foot painful bunion deformity.  She has failed non op treatment and presents today for surgical correction.  Past Medical History  Diagnosis Date  . Hyperlipidemia   . History of TIA (transient ischemic attack) 2004  . Arthritis     neck, knees, ankles, feet, back  . Asthma     prn inhaler  . Hallux valgus of right foot 05/2014  . TMJ syndrome   . Limited joint range of motion     neck - states has bone spurs C2-5  . Dental crowns present     Past Surgical History  Procedure Laterality Date  . Nasal septum surgery  1981  . Bunionectomy Left 07/17/2007  . Appendectomy  1979  . Tonsillectomy  1964  . Tubal ligation  1999  . Cesarean section  1986  . Ganglion cyst excision      wrist  . Shoulder arthroscopy Left 06/07/2008  . Acromionectomy Left 08/18/2008  . Rotator cuff repair w/ distal clavicle excision Left 08/18/2008    Family History  Problem Relation Age of Onset  . Cancer Mother     lung  . Diabetes Mother   . Thalassemia Mother   . Thalassemia Sister   . Cancer Brother     lymphatic  . Thalassemia Maternal Aunt   . Heart disease Paternal Aunt    Social History:  reports that she quit smoking about 8 years ago. She has never used smokeless tobacco. She reports that she drinks alcohol. She reports that she does not use illicit drugs.  Allergies:  Allergies  Allergen Reactions  . Penicillins Shortness Of Breath    REACTION: TONGUE SWELLING  . Promethazine Hcl Shortness Of Breath  . Clarithromycin Nausea And Vomiting  . Levofloxacin Nausea And Vomiting  . Statins Other (See Comments)    LEG CRAMPS    Medications Prior to Admission  Medication Sig Dispense Refill  . albuterol (PROVENTIL HFA;VENTOLIN HFA) 108 (90 BASE) MCG/ACT inhaler Inhale 2 puffs into the lungs every 4 (four) hours as needed for wheezing or shortness of breath.  1 Inhaler  0     Results for orders placed during the hospital encounter of 06/13/2014 (from the past 48 hour(s))  POCT HEMOGLOBIN-HEMACUE     Status: None   Collection Time    June 13, 2014  7:55 AM      Result Value Ref Range   Hemoglobin 13.5  12.0 - 15.0 g/dL   No results found.  ROS  No recent f/c/n/v/wt lloss  Blood pressure 133/83, pulse 70, temperature 97.8 F (36.6 C), temperature source Oral, resp. rate 16, height 5' 2.5" (1.588 m), weight 105.858 kg (233 lb 6 oz), SpO2 98.00%. Physical Exam  wn wd woman in nad.  A and O x 4.  Mood and affect normal.  EOMI.  resp unlabored.  R foot with signfiicant hallux valgus deformity.  2+ dp pulses.  Normal sens to LT at the dorsal and plantar R foot.  5/5 strength in PF and DF of the ankle and toes.  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  The risks and benefits of the alternative treatment options have been discussed in detail.  The patient wishes to proceed with surgery and specifically understands risks of bleeding, infection, nerve damage, blood clots, need for additional surgery, amputation and death.   Wylene Simmer June 13, 2014, 9:20 AM

## 2014-05-26 NOTE — Transfer of Care (Signed)
Immediate Anesthesia Transfer of Care Note  Patient: [name]  Procedure(s) Performed: Procedure(s): RIGHT FOOT LAPIDUS BUNION CORRECTION AMD MODIFIED MCBRIDE BUNIONECTOMY (Right)  Patient Location: PACU  Anesthesia Type:General  Level of Consciousness: awake and patient cooperative  Airway & Oxygen Therapy: Patient Spontanous Breathing and Patient connected to face mask oxygen  Post-op Assessment: Report given to PACU RN and Post -op Vital signs reviewed and stable  Post vital signs: Reviewed and stable  Complications: No apparent anesthesia complications

## 2014-05-26 NOTE — Anesthesia Procedure Notes (Signed)
Procedure Name: LMA Insertion Date/Time: 05/26/2014 9:45 AM Performed by: Skiler Tye Pre-anesthesia Checklist: Patient identified, Emergency Drugs available, Suction available and Patient being monitored Patient Re-evaluated:Patient Re-evaluated prior to inductionOxygen Delivery Method: Circle System Utilized Preoxygenation: Pre-oxygenation with 100% oxygen Intubation Type: IV induction Ventilation: Mask ventilation without difficulty LMA: LMA inserted LMA Size: 4.0 Number of attempts: 1 Airway Equipment and Method: bite block Placement Confirmation: positive ETCO2 Tube secured with: Tape Dental Injury: Teeth and Oropharynx as per pre-operative assessment

## 2014-05-26 NOTE — Op Note (Signed)
[name], [name] NO.:  [id number]  MEDICAL RECORD NO.:  [id number]  LOCATION:                                 FACILITY:  PHYSICIAN:  Wylene Simmer, MD             DATE OF BIRTH:  DATE OF PROCEDURE:  05/26/2014 DATE OF DISCHARGE:                              OPERATIVE REPORT   PREOPERATIVE DIAGNOSES: 1. Right foot hallux valgus. 2. Right foot metatarsus primus varus.  POSTOPERATIVE DIAGNOSES: 1. Right foot hallux valgus. 2. Right foot metatarsus primus varus.  PROCEDURE: 1. Right foot Lapidus bunion correction. 2. Right foot modified McBride bunionectomy. 3. Right foot AP, lateral, and oblique radiographs.  SURGEON:  Wylene Simmer, MD  ANESTHESIA:  General.  ESTIMATED BLOOD LOSS:  Minimal.  TOURNIQUET TIME:  56 minutes at 300 mmHg.  COMPLICATIONS:  None apparent.  DISPOSITION:  Extubated awake and stable to recovery.  INDICATIONS FOR PROCEDURE:  The patient is a 58 year old woman who complains of painful right foot bunion deformity.  This has been present for many years.  She presents now for operative treatment of this painful condition having failed nonoperative treatment.  She understands the risks and benefits, the alternative treatment options, and elects surgical treatment.  She specifically understands risks of bleeding, infection, nerve damage, blood clots, need for additional surgery, continued pain, amputation, and death.  PROCEDURE IN DETAIL:  After preoperative consent was obtained and the correct operative site was identified, the patient was brought to the operating room and placed supine on the operating table.  General anesthesia was induced.  Preoperative antibiotics were administered. Surgical time-out was taken.  The right lower extremity was prepped and draped in standard sterile fashion with tourniquet around the thigh. The extremity was exsanguinated and the tourniquet was inflated to 300 mmHg.  A longitudinal incision  was then made at the dorsum of the first webspace.  Sharp dissection was carried down through skin and subcutaneous tissue.  The intermetatarsal ligament was divided under direct vision.  An arthrotomy was then made between the lateral sesamoid and metatarsal head.  Several perforations were then made in the lateral joint capsule adjacent to the MP joint.  The hallux could then be positioned in 20 degrees of varus passively.  Attention was then turned to the medial eminence where a longitudinal incision was made.  Sharp dissection was carried down through the skin and subcutaneous tissue.  The medial joint capsule was incised and elevated plantarly and dorsally exposing the hypertrophic medial eminence.  Medial eminence was resected with an oscillating saw.  Attention was then turned to the first tarsometatarsal joint where a longitudinal incision was made dorsally.  Sharp dissection was carried down through the skin and subcutaneous tissue.  The interval between the extensor hallucis longus and brevis was developed.  First TMT joint was incised and the joint capsule was elevated medially and laterally. Elevators were used to protect the extensor tendons.  An oscillating saw was then used to resect the articular cartilage and subchondral bone on both sides of the joint.  The distal cut was beveled to take more bone laterally  correcting the  metatarsus primus varus.  Wound was irrigated copiously.  The joint surfaces were reduced and provisionally pinned. Simulated weightbearing AP radiograph confirmed appropriate correction of the intermetatarsal and hallux valgus angles.  The proximal guide pin was then used to insert a partially-threaded 4-0 cannulated screw.  This was noted to compress the joint appropriately.  A second guide pin placed from distal to proximal was then used to place a second partially- threaded 4 mm screw.  These were both from the [employer] cannulated screw set.  Both  screws were noted to have appropriate purchase.  AP, lateral, and oblique radiographs confirmed appropriate position and length of all hardware and appropriate correction of the hallux valgus and intermetatarsal angles.  The wounds were irrigated copiously.  The medial joint capsule was repaired with imbricating sutures of 2-0 Vicryl.  The extensor retinaculum dorsally was closed with simple sutures of 2-0 Vicryl as well.  Skin incisions were then closed with Monocryl and nylon.  Sterile dressings were applied followed by a bunion wrap and a well-padded short-leg splint.  Prior to applying the splint, 20 mL of Exparel mixed with 10 mL of normal saline were infiltrated into the skin around all of the surgical sites.  Tourniquet was released at 56 minutes after application of the splint.  The patient was then awakened from anesthesia and transported to the recovery room in stable condition.  FOLLOWUP PLAN:  The patient will be nonweightbearing on the right lower extremity.  She will follow up with me in 2 weeks for suture removal and conversion to a cast.  The patient will start aspirin daily for DVT prophylaxis.  RADIOGRAPHS:  AP, lateral, and oblique radiographs were obtained intraoperatively.  These show interval correction of the hallux valgus and intermetatarsal angles with appropriately positioned hardware.  No other acute injury is noted.     Wylene Simmer, MD     JH/MEDQ  D:  05/26/2014  T:  05/26/2014  Job:  [id number]

## 2014-05-27 ENCOUNTER — Encounter (HOSPITAL_BASED_OUTPATIENT_CLINIC_OR_DEPARTMENT_OTHER): Payer: Self-pay | Admitting: Orthopedic Surgery

## 2014-05-27 NOTE — Addendum Note (Signed)
Addendum created 05/27/14 0730 by Tawni Millers, CRNA   Modules edited: Charges VN

## 2014-08-05 HISTORY — PX: OTHER SURGICAL HISTORY: SHX169

## 2014-08-05 HISTORY — PX: GANGLION CYST EXCISION: SHX1691

## 2014-09-07 ENCOUNTER — Telehealth: Payer: Self-pay | Admitting: Internal Medicine

## 2014-09-07 NOTE — Telephone Encounter (Signed)
Patient is requesting copy of health screening form that Dr. Doug Sou completed in October.  I looked in media and did not see a copy.  Can you please look behind me to make sure.

## 2014-10-11 ENCOUNTER — Ambulatory Visit (INDEPENDENT_AMBULATORY_CARE_PROVIDER_SITE_OTHER): Payer: 59 | Admitting: Internal Medicine

## 2014-10-11 VITALS — BP 112/80 | HR 88 | Temp 98.6°F | Resp 18 | Wt 243.0 lb

## 2014-10-11 DIAGNOSIS — J4521 Mild intermittent asthma with (acute) exacerbation: Secondary | ICD-10-CM

## 2014-10-11 MED ORDER — AZITHROMYCIN 250 MG PO TABS: ORAL_TABLET | ORAL | Status: DC

## 2014-10-11 MED ORDER — METHYLPREDNISOLONE ACETATE 40 MG/ML IJ SUSP
40.0000 mg | Freq: Once | INTRAMUSCULAR | Status: AC
  Administered 2014-10-11: 40 mg via INTRAMUSCULAR

## 2014-10-11 NOTE — Addendum Note (Signed)
Addended by: Resa Miner R on: 10/11/2014 08:59 AM   Modules accepted: Orders

## 2014-10-11 NOTE — Progress Notes (Signed)
Pre visit review using our clinic review tool, if applicable. No additional management support is needed unless otherwise documented below in the visit note. 

## 2014-10-11 NOTE — Progress Notes (Signed)
   Subjective:    Patient ID: [name], female    DOB: 1956-01-15, 59 y.o.   MRN: [id number]  HPI The patient is a 59 YO female who is coming in for cough and cold symptoms. They started about 1-2 weeks ago. She started with ear pain and popping, then developed nasal drainage and pressure in her face. She started coughing about 1 week ago and then started being productive with green stuff. She has had fevers and chills (to 101 yesterday). No one has been sick around her. She has been having some SOB and using her albuterol more often. More dyspnea with walking.   Review of Systems  Constitutional: Positive for fever, chills and activity change. Negative for appetite change, fatigue and unexpected weight change.  HENT: Positive for congestion, ear pain, postnasal drip, rhinorrhea, sinus pressure and sore throat. Negative for facial swelling, hearing loss and trouble swallowing.   Respiratory: Positive for cough and shortness of breath. Negative for chest tightness and wheezing.   Cardiovascular: Negative for chest pain, palpitations and leg swelling.  Gastrointestinal: Negative.       Objective:   Physical Exam  Constitutional: She appears well-developed and well-nourished.  HENT:  Head: Normocephalic and atraumatic.  Both ear canals with redness, TMs both normal. Oropharynx with redness no purulent drainage, turbinates red and swollen some crusting.   Eyes: EOM are normal.  Neck: Normal range of motion.  Cardiovascular: Normal rate and regular rhythm.   Pulmonary/Chest: Effort normal. No respiratory distress. She has no wheezes. She has no rales.  Crackles in the right lung, no wheeze  Abdominal: Soft. Bowel sounds are normal.  Lymphadenopathy:    She has no cervical adenopathy.  Skin: Skin is warm and dry.   Filed Vitals:   10/11/14 0832  BP: 112/80  Pulse: 88  Temp: 98.6 F (37 C)  TempSrc: Oral  Resp: 18  Weight: 243 lb (110.224 kg)  SpO2: 97%      Assessment &  Plan:  Depo-medrol 40 mg given IM today

## 2014-10-11 NOTE — Assessment & Plan Note (Signed)
Mild intermittent in exacerbation today. Will treat with depo-medrol and azithromycin (allergy to clarithromycin of nausea and vomiting but per patient has tolerated azithromycin without problems in the past). Continue using albuterol prn and either zyrtec or flonase (she has both at home). Likely set off by her allergic rhinitis.

## 2014-10-11 NOTE — Patient Instructions (Signed)
We have sent in the azithromycin (z-pack). Take 2 pills today, then 1 pill a day until it is gone.   We have also given you the steroid injection to help the lungs.   The other thing we recommend to dry up the drainage would be either zyrtec over the counter or a nasal steroid like flonase which we can call in for you.   Acute Bronchitis Bronchitis is inflammation of the airways that extend from the windpipe into the lungs (bronchi). The inflammation often causes mucus to develop. This leads to a cough, which is the most common symptom of bronchitis.  In acute bronchitis, the condition usually develops suddenly and goes away over time, usually in a couple weeks. Smoking, allergies, and asthma can make bronchitis worse. Repeated episodes of bronchitis may cause further lung problems.  CAUSES Acute bronchitis is most often caused by the same virus that causes a cold. The virus can spread from person to person (contagious) through coughing, sneezing, and touching contaminated objects. SIGNS AND SYMPTOMS   Cough.   Fever.   Coughing up mucus.   Body aches.   Chest congestion.   Chills.   Shortness of breath.   Sore throat.  DIAGNOSIS  Acute bronchitis is usually diagnosed through a physical exam. Your health care provider will also ask you questions about your medical history. Tests, such as chest X-rays, are sometimes done to rule out other conditions.  TREATMENT  Acute bronchitis usually goes away in a couple weeks. Oftentimes, no medical treatment is necessary. Medicines are sometimes given for relief of fever or cough. Antibiotic medicines are usually not needed but may be prescribed in certain situations. In some cases, an inhaler may be recommended to help reduce shortness of breath and control the cough. A cool mist vaporizer may also be used to help thin bronchial secretions and make it easier to clear the chest.  HOME CARE INSTRUCTIONS  Get plenty of rest.   Drink  enough fluids to keep your urine clear or pale yellow (unless you have a medical condition that requires fluid restriction). Increasing fluids may help thin your respiratory secretions (sputum) and reduce chest congestion, and it will prevent dehydration.   Take medicines only as directed by your health care provider.  If you were prescribed an antibiotic medicine, finish it all even if you start to feel better.  Avoid smoking and secondhand smoke. Exposure to cigarette smoke or irritating chemicals will make bronchitis worse. If you are a smoker, consider using nicotine gum or skin patches to help control withdrawal symptoms. Quitting smoking will help your lungs heal faster.   Reduce the chances of another bout of acute bronchitis by washing your hands frequently, avoiding people with cold symptoms, and trying not to touch your hands to your mouth, nose, or eyes.   Keep all follow-up visits as directed by your health care provider.  SEEK MEDICAL CARE IF: Your symptoms do not improve after 1 week of treatment.  SEEK IMMEDIATE MEDICAL CARE IF:  You develop an increased fever or chills.   You have chest pain.   You have severe shortness of breath.  You have bloody sputum.   You develop dehydration.  You faint or repeatedly feel like you are going to pass out.  You develop repeated vomiting.  You develop a severe headache. MAKE SURE YOU:   Understand these instructions.  Will watch your condition.  Will get help right away if you are not doing well or get worse.  Document Released: 08/29/2004 Document Revised: 12/06/2013 Document Reviewed: 01/12/2013 [facility] Patient Information 2015 [place], [place]. This information is not intended to replace advice given to you by your health care provider. Make sure you discuss any questions you have with your health care provider.

## 2014-10-15 ENCOUNTER — Encounter: Payer: Self-pay | Admitting: Family

## 2014-10-15 ENCOUNTER — Telehealth: Payer: Self-pay | Admitting: Internal Medicine

## 2014-10-15 ENCOUNTER — Ambulatory Visit (INDEPENDENT_AMBULATORY_CARE_PROVIDER_SITE_OTHER): Payer: 59 | Admitting: Family

## 2014-10-15 VITALS — HR 78 | Temp 98.2°F | Wt 241.0 lb

## 2014-10-15 DIAGNOSIS — R059 Cough, unspecified: Secondary | ICD-10-CM | POA: Insufficient documentation

## 2014-10-15 DIAGNOSIS — R05 Cough: Secondary | ICD-10-CM

## 2014-10-15 MED ORDER — ALBUTEROL SULFATE HFA 108 (90 BASE) MCG/ACT IN AERS: 2.0000 | INHALATION_SPRAY | RESPIRATORY_TRACT | Status: DC | PRN

## 2014-10-15 MED ORDER — DOXYCYCLINE HYCLATE 100 MG PO TABS: 100.0000 mg | ORAL_TABLET | Freq: Two times a day (BID) | ORAL | Status: DC

## 2014-10-15 MED ORDER — PREDNISONE 20 MG PO TABS: 20.0000 mg | ORAL_TABLET | Freq: Two times a day (BID) | ORAL | Status: DC

## 2014-10-15 NOTE — Assessment & Plan Note (Signed)
Symptoms and exam consistent with asthmatic bronchitis. Start doxycyline. Start prednisone for shortness of breath. Continue over the counter medications as needed for symptom relief and supportive care. Patient instructed to seek further care if symptoms worsen or fail to improve.

## 2014-10-15 NOTE — Telephone Encounter (Signed)
Called requesting rx for "another abx" as hasn't been able to get thru to primary care but no longer established with pulmonary.  Explained we'd be happy to re-establish with her next week but for now should go to primary care sat clinic or [facility] over the weekend if needed

## 2014-10-15 NOTE — Progress Notes (Signed)
   Subjective:    Patient ID: [name], female    DOB: Mar 06, 1956, 59 y.o.   MRN: [id number]  Chief Complaint  Patient presents with  . URI    was seen x 5 days--finished Ax--Sx have worsen--cough mostly non productive--pt is out of inhaler    HPI:  [name] is a 59 y.o. female who presents today for an acute visit.   Patient was recently seen for similar symptoms and has completed a round of azithromycin and received a depo-medrol shot. She continues to experience the associated symptoms of cough, shortness of breath and chest tightness. Has been taking OTC zyrtec in addition to the antibiotics. Notes that she has had a fever that ranges from 99-101 depending upon medication. Overall states she feels worse since the last visit without improvement.    Allergies  Allergen Reactions  . Penicillins Shortness Of Breath    REACTION: TONGUE SWELLING  . Promethazine Hcl Shortness Of Breath  . Clarithromycin Nausea And Vomiting  . Levofloxacin Nausea And Vomiting  . Statins Other (See Comments)    LEG CRAMPS    Current Outpatient Prescriptions on File Prior to Visit  Medication Sig Dispense Refill  . aspirin EC 325 MG tablet Take 1 tablet (325 mg total) by mouth daily. 42 tablet 0  . albuterol (PROVENTIL HFA;VENTOLIN HFA) 108 (90 BASE) MCG/ACT inhaler Inhale 2 puffs into the lungs every 4 (four) hours as needed for wheezing or shortness of breath. (Patient not taking: Reported on 10/15/2014) 1 Inhaler 0   No current facility-administered medications on file prior to visit.    Review of Systems  Constitutional: Positive for fever.  HENT: Positive for sinus pressure. Negative for congestion and sore throat.   Respiratory: Positive for cough, chest tightness and shortness of breath.   Gastrointestinal: Negative for nausea and vomiting.  Neurological: Negative for numbness.      Objective:    Pulse 78  Temp(Src) 98.2 F (36.8 C) (Oral)  Wt 241 lb (109.317 kg)  SpO2  98% Nursing note and vital signs reviewed.  Physical Exam  Constitutional: She is oriented to person, place, and time. She appears well-developed and well-nourished. No distress.  HENT:  Right Ear: Hearing, tympanic membrane, external ear and ear canal normal.  Left Ear: Tympanic membrane, external ear and ear canal normal.  Nose: Right sinus exhibits no maxillary sinus tenderness and no frontal sinus tenderness. Left sinus exhibits no maxillary sinus tenderness and no frontal sinus tenderness.  Mouth/Throat: Uvula is midline, oropharynx is clear and moist and mucous membranes are normal.  Cardiovascular: Normal rate, regular rhythm, normal heart sounds and intact distal pulses.   Pulmonary/Chest: Effort normal and breath sounds normal.  Neurological: She is alert and oriented to person, place, and time.  Skin: Skin is warm and dry.  Psychiatric: She has a normal mood and affect. Her behavior is normal. Judgment and thought content normal.       Assessment & Plan:

## 2014-10-15 NOTE — Progress Notes (Signed)
Pre visit review using our clinic review tool, if applicable. No additional management support is needed unless otherwise documented below in the visit note. 

## 2014-10-15 NOTE — Patient Instructions (Signed)
Thank you for choosing [facility].  Summary/Instructions:  Your prescription(s) have been submitted to your pharmacy or been printed and provided for you. Please take as directed and contact our office if you believe you are having problem(s) with the medication(s) or have any questions.  If your symptoms worsen or fail to improve, please contact our office for further instruction, or in case of emergency go directly to the emergency room at the closest medical facility.   Please start taking the doxycycline and prednisone.

## 2014-10-17 ENCOUNTER — Telehealth: Payer: Self-pay | Admitting: Internal Medicine

## 2014-10-17 ENCOUNTER — Other Ambulatory Visit: Payer: Self-pay | Admitting: Internal Medicine

## 2014-10-17 MED ORDER — ONDANSETRON HCL 4 MG PO TABS: 4.0000 mg | ORAL_TABLET | Freq: Three times a day (TID) | ORAL | Status: DC | PRN

## 2014-10-17 NOTE — Telephone Encounter (Signed)
Pt called in said she is so nauseas from the meds she was put on on Saturday.    She can not take finogren she is allergic

## 2014-10-17 NOTE — Telephone Encounter (Signed)
She has ondansetron for nausea does she need more?

## 2014-10-17 NOTE — Telephone Encounter (Signed)
Patient Name: [name] DOB: 1956/06/09 Initial Comment Caller state she is having some shortness of breath currently, she has acute bronchitis, her script was changed over the weekend and is having a reaction to doxycycline, she is having trouble breathing, nausea and diarrhea, she is allergic to penicillin. She wants a medication called in. Nurse Assessment Nurse: Genoveva Ill, RN, Lattie Haw Date/Time (Eastern Time): 10/17/2014 3:52:08 PM Confirm and document reason for call. If symptomatic, describe symptoms. ---Caller state she is having some shortness of breath currently, she has acute bronchitis, her script was changed over the weekend and is having a reaction to doxycycline, she is having trouble breathing, nausea and diarrhea, she is allergic to penicillin. She wants a medication for nausea, not phenergan as she is allergic; has appt tomorrow and called at 8:30 am for nausea med to be called in and was told would be called back about it and hasn't been;, very upset; fever since last night Has the patient traveled out of the country within the last 30 days? ---Not Applicable Does the patient require triage? ---Yes Related visit to physician within the last 2 weeks? ---Yes Monday- bronchitis and Sat Does the PT have any chronic conditions? (i.e. diabetes, asthma, etc.) ---Yes List chronic conditions. ---asthma Nurse: Genoveva Ill, RN, Lattie Haw Date/Time (Eastern Time): 10/17/2014 4:10:05 PM Please select the assessment type ---Standing order Other current medications? ---Yes List current medications. ---PREDNISONE, ZYRTEC, ALBUTEROL, DOXYCYCLINE (LAST TAKEN THIS AM) Medication allergies? ---Yes List medication allergies. ---PCN, [place] name and phone number. ---[facility]/ [address] , doesn't have number at this time Additional Documentation ---calling office for MD authorization, considering ER outcome; advised caller will call her back Guidelines Guideline Title  Affirmed Question Affirmed Notes Infection on Antibiotic Follow-up Call MODERATE difficulty breathing (e.g., speaks in phrases, SOB even at rest, pulse 100-120) PLEASE NOTE: All timestamps contained within this report are represented as [place] Standard Time. CONFIDENTIALTY NOTICE: This fax transmission is intended only for the addressee. It contains information that is legally privileged, confidential or otherwise protected from use or disclosure. If you are not the intended recipient, you are strictly prohibited from reviewing, disclosing, copying using or disseminating any of this information or taking any action in reliance on or regarding this information. If you have received this fax in error, please notify us immediately by telephone so that we can arrange for its return to [place]. Phone: [phone], Toll-Free: [phone], Fax: [phone] Page: 2 of 2 Call Id: [id number] Final Disposition User Go to ED Now (or PCP triage) Genoveva Ill, RN, Lattie Haw Comments spoke with Edwena Blow at office to notify of ER refusal and insisting on med; discussed with Amy, MA for Dr. Doug Sou, who states she will call caller and does not think MD will call in nausea med without seeing pt; pt did state that she had already been told there were no appts for today

## 2014-10-18 ENCOUNTER — Ambulatory Visit (INDEPENDENT_AMBULATORY_CARE_PROVIDER_SITE_OTHER): Payer: 59 | Admitting: Internal Medicine

## 2014-10-18 ENCOUNTER — Ambulatory Visit (INDEPENDENT_AMBULATORY_CARE_PROVIDER_SITE_OTHER)
Admission: RE | Admit: 2014-10-18 | Discharge: 2014-10-18 | Disposition: A | Discharge status: Home / Self Care | Payer: 59 | Source: Ambulatory Visit | Attending: Internal Medicine | Admitting: Internal Medicine

## 2014-10-18 ENCOUNTER — Other Ambulatory Visit (INDEPENDENT_AMBULATORY_CARE_PROVIDER_SITE_OTHER): Payer: 59

## 2014-10-18 ENCOUNTER — Encounter: Payer: Self-pay | Admitting: Internal Medicine

## 2014-10-18 VITALS — BP 118/80 | HR 95 | Temp 98.4°F | Resp 14 | Ht 63.0 in | Wt 235.0 lb

## 2014-10-18 DIAGNOSIS — J209 Acute bronchitis, unspecified: Secondary | ICD-10-CM

## 2014-10-18 DIAGNOSIS — R112 Nausea with vomiting, unspecified: Secondary | ICD-10-CM

## 2014-10-18 DIAGNOSIS — R197 Diarrhea, unspecified: Secondary | ICD-10-CM

## 2014-10-18 LAB — CBC WITH DIFFERENTIAL/PLATELET
BASOS ABS: 0 10*3/uL (ref 0.0–0.1)
Basophils Relative: 0.5 % (ref 0.0–3.0)
Eosinophils Absolute: 0.1 10*3/uL (ref 0.0–0.7)
Eosinophils Relative: 1.7 % (ref 0.0–5.0)
HEMATOCRIT: 44.6 % (ref 36.0–46.0)
Hemoglobin: 15.2 g/dL — ABNORMAL HIGH (ref 12.0–15.0)
LYMPHS PCT: 17.8 % (ref 12.0–46.0)
Lymphs Abs: 1.3 10*3/uL (ref 0.7–4.0)
MCHC: 34.1 g/dL (ref 30.0–36.0)
MCV: 83.7 fl (ref 78.0–100.0)
Monocytes Absolute: 0.4 10*3/uL (ref 0.1–1.0)
Monocytes Relative: 5.5 % (ref 3.0–12.0)
NEUTROS ABS: 5.2 10*3/uL (ref 1.4–7.7)
Neutrophils Relative %: 74.5 % (ref 43.0–77.0)
Platelets: 254 10*3/uL (ref 150.0–400.0)
RBC: 5.33 Mil/uL — ABNORMAL HIGH (ref 3.87–5.11)
RDW: 13.7 % (ref 11.5–15.5)
WBC: 7 10*3/uL (ref 4.0–10.5)

## 2014-10-18 LAB — BASIC METABOLIC PANEL
BUN: 19 mg/dL (ref 6–23)
CHLORIDE: 102 meq/L (ref 96–112)
CO2: 29 meq/L (ref 19–32)
Calcium: 9.7 mg/dL (ref 8.4–10.5)
Creatinine, Ser: 1.15 mg/dL (ref 0.40–1.20)
GFR: 51.8 mL/min — ABNORMAL LOW (ref >60.00)
GLUCOSE: 104 mg/dL — AB (ref 70–99)
Potassium: 3.7 mEq/L (ref 3.5–5.1)
SODIUM: 136 meq/L (ref 135–145)

## 2014-10-18 LAB — HEPATIC FUNCTION PANEL
ALBUMIN: 4.8 g/dL (ref 3.5–5.2)
ALT: 18 U/L (ref 0–35)
AST: 14 U/L (ref 0–37)
Alkaline Phosphatase: 112 U/L (ref 39–117)
Bilirubin, Direct: 0.1 mg/dL (ref 0.0–0.3)
TOTAL PROTEIN: 7.8 g/dL (ref 6.0–8.3)
Total Bilirubin: 0.5 mg/dL (ref 0.2–1.2)

## 2014-10-18 MED ORDER — DIPHENOXYLATE-ATROPINE 2.5-0.025 MG PO TABS: 1.0000 | ORAL_TABLET | Freq: Four times a day (QID) | ORAL | Status: DC | PRN

## 2014-10-18 NOTE — Progress Notes (Signed)
Pre visit review using our clinic review tool, if applicable. No additional management support is needed unless otherwise documented below in the visit note. 

## 2014-10-18 NOTE — Progress Notes (Signed)
   Subjective:    Patient ID: [name], female    DOB: 1956/06/07, 59 y.o.   MRN: [id number]  HPI This is her third office visit since 10/11/2014. She is here with persistent nausea as well as intermittent diarrhea described as recurrent "squirts". This is in the context of starting doxycycline 10/16/14. She had nausea vomiting 1 and began have the diarrhea. The nausea has persisted and is associated with anorexia. She also describes nocturnal fever, chills, sweats  She was seen 10/11/14 and given Depo-Medrol as well as a Z-Pak for asthma exacerbation. She was seen in the Saturday clinic 3/12 and given prednisone and doxycycline for persistent asthmatic bronchitis.  She has frontal and facial discomfort. She describes pain in her gums. The cough is nonproductive but associated with shortness of breath and wheezing.  Review of Systems She denies nasal purulence, otic discharge, itchy or watery eyes.    Objective:   Physical Exam  Pertinent positive findings include: Right tympanic membrane is dull.  The tongue is beefy red.  There is marked erythema of the nares.  She exhibits an S4.  Minimal tenting is present.  General appearance :adequately nourished; in no distress. Eyes: No conjunctival inflammation or scleral icterus is present. Oral exam:  Lips and gums are healthy appearing.There is no oropharyngeal erythema or exudate noted. Dental hygiene is good. Heart:  Normal rate(pulse 84) ; regular rhythm. S1 and S2 normal without gallop, murmur, click, or rub . Lungs:Chest clear to auscultation; no wheezes, rhonchi,rales ,or rubs present.No increased work of breathing.  Abdomen: bowel sounds normal, soft and non-tender without masses, organomegaly or hernias noted.  No guarding or rebound. No flank tenderness to percussion. Vascular : all pulses equal ; no bruits present. Skin:Warm & dry.  Intact without suspicious lesions or rashes ; no jaundice  Lymphatic: No lymphadenopathy is  noted about the head, neck, axilla Neuro: Strength, tone & DTRs normal.       Assessment & Plan:  #1 diarrhea  #2 nausea  #3 asthmatic bronchitis  Plan: See orders and recommendations

## 2014-10-18 NOTE — Telephone Encounter (Signed)
Pt was seen today.

## 2014-10-18 NOTE — Patient Instructions (Addendum)
Stay on clear liquids for 48-72 hours or until bowels are normal.This would include  jello, sherbert (NOT ice cream), Lipton's chicken noodle soup(NOT cream based soups),Gatorade Lite, flat Ginger ale (without High Fructose Corn Syrup),dry toast or crackers, baked potato.No milk , dairy or grease until bowels are formed. Florastor OR Align , a [employer] , daily if stools are loose. Immodium AD for frankly watery stool. Report increasing pain, fever or rectal bleeding  Plain Mucinex (NOT D) for thick secretions ;force NON dairy fluids .   Nasal cleansing in the shower as discussed with lather of mild shampoo.After 10 seconds wash off lather while  exhaling through nostrils. Make sure that all residual soap is removed to prevent irritation.  Flonase OR Nasacort AQ 1 spray in each nostril twice a day as needed. Use the "crossover" technique into opposite nostril spraying toward opposite ear @ 45 degree angle, not straight up into nostril.  Plain Allegra (NOT D )  160 daily , Loratidine 10 mg , OR Zyrtec 10 mg @ bedtime  as needed for itchy eyes & sneezing.  Breo 1 inhalation daily (Lot [id number];IRS 11/17): 1 inhalation daily ; gargle & spit after use.

## 2014-10-19 ENCOUNTER — Telehealth: Payer: Self-pay | Admitting: *Deleted

## 2014-10-19 NOTE — Telephone Encounter (Signed)
[facility] - Client [place] [facility] Center Patient Name: CLEATUS GOODIN Gender: Female DOB: 29-Nov-1955 Age: 59 Y 40 M 2 D Return Phone Number: [phone] (Primary) Address: City/State/Zip: [place] Client [facility] - Client Client Site [facility] - Day Physician Terri Piedra Contact Type Call Call Type Triage / Clinical Relationship To Patient Self Appointment Disposition EMR Appointment Attempted - Not Scheduled Return Phone Number [phone] (Primary) Chief Complaint BREATHING - shortness of breath or sounds breathless Initial Comment Caller state she is having some shortness of breath currently, she has acute bronchitis, her script was changed over the weekend and is having a reaction to doxycycline, she is having trouble breathing, nausea and diarrhea, she is allergic to penicillin. She wants a medication called in. PreDisposition Call Doctor Info pasted into Epic Yes Nurse Assessment Nurse: Genoveva Ill, RN, Lattie Haw Date/Time (Eastern Time): 10/17/2014 3:52:08 PM Confirm and document reason for call. If symptomatic, describe symptoms. ---Caller state she is having some shortness of breath currently, she has acute bronchitis, her script was changed over the weekend and is having a reaction to doxycycline, she is having trouble breathing, nausea and diarrhea, she is allergic to penicillin. She wants a medication for nausea, not phenergan as she is allergic; has appt tomorrow and called at 8:30 am for nausea med to be called in and was told would be called back about it and hasn't been;, very upset; fever since last night Has the patient traveled out of the country within the last 30 days? ---Not Applicable Does the patient require triage? ---Yes Related visit to physician within the last 2 weeks? ---Yes Monday- bronchitis and Sat Does the PT have any chronic conditions? (i.e. diabetes, asthma,  etc.) ---Yes List chronic conditions. ---asthma Nurse: Genoveva Ill, RN, Lattie Haw Date/Time (Eastern Time): 10/17/2014 4:10:05 PM Please select the assessment type ---Standing order Other current medications? ---Yes PLEASE NOTE: All timestamps contained within this report are represented as [place] Standard Time. CONFIDENTIALTY NOTICE: This fax transmission is intended only for the addressee. It contains information that is legally privileged, confidential or otherwise protected from use or disclosure. If you are not the intended recipient, you are strictly prohibited from reviewing, disclosing, copying using or disseminating any of this information or taking any action in reliance on or regarding this information. If you have received this fax in error, please notify us immediately by telephone so that we can arrange for its return to [place]. Phone: [phone], Toll-Free: [phone], Fax: [phone] Page: 2 of 2 Call Id: [id number] Nurse Assessment List current medications. ---PREDNISONE, ZYRTEC, ALBUTEROL, DOXYCYCLINE (LAST TAKEN THIS AM) Medication allergies? ---Yes List medication allergies. ---PCN, [place] name and phone number. ---[facility]/ [address] , doesn't have number at this time Additional Documentation ---calling office for MD authorization, considering ER outcome; advised caller will call her back Guidelines Guideline Title Affirmed Question Affirmed Notes Nurse Date/Time Eilene Ghazi Time) Infection on Antibiotic Follow-up Call MODERATE difficulty breathing (e.g., speaks in phrases, SOB even at rest, pulse 100-120) [place], RN, Lattie Haw 10/17/2014 4:00:24 PM

## 2014-12-01 ENCOUNTER — Telehealth: Payer: Self-pay | Admitting: Internal Medicine

## 2014-12-01 NOTE — Telephone Encounter (Signed)
Patient is requesting a call back from Dr. Doug Sou.  She wanted to speak with York Cerise but York Cerise was not at her desk. Patient would not tell me what this is in regards to.  All she said was that it was in regards to her account.

## 2014-12-02 NOTE — Telephone Encounter (Signed)
Will forward to [facility] to see if she can call her back since it was her to whom she wanted to speak.

## 2015-02-08 DIAGNOSIS — M2580 Other specified joint disorders, unspecified joint: Secondary | ICD-10-CM | POA: Insufficient documentation

## 2015-02-08 DIAGNOSIS — G5751 Tarsal tunnel syndrome, right lower limb: Secondary | ICD-10-CM | POA: Insufficient documentation

## 2015-03-03 ENCOUNTER — Telehealth: Payer: Self-pay | Admitting: Internal Medicine

## 2015-03-03 NOTE — Telephone Encounter (Signed)
Called and left message for patient to call back to make appointment.

## 2015-03-08 DIAGNOSIS — T148XXA Other injury of unspecified body region, initial encounter: Secondary | ICD-10-CM | POA: Insufficient documentation

## 2015-03-08 DIAGNOSIS — M7741 Metatarsalgia, right foot: Secondary | ICD-10-CM | POA: Insufficient documentation

## 2015-03-09 ENCOUNTER — Encounter: Payer: 59 | Admitting: Internal Medicine

## 2015-03-14 ENCOUNTER — Ambulatory Visit (INDEPENDENT_AMBULATORY_CARE_PROVIDER_SITE_OTHER): Payer: 59 | Admitting: Internal Medicine

## 2015-03-14 ENCOUNTER — Other Ambulatory Visit (INDEPENDENT_AMBULATORY_CARE_PROVIDER_SITE_OTHER): Payer: 59

## 2015-03-14 ENCOUNTER — Encounter: Payer: Self-pay | Admitting: Internal Medicine

## 2015-03-14 ENCOUNTER — Other Ambulatory Visit: Payer: Self-pay | Admitting: Internal Medicine

## 2015-03-14 VITALS — BP 122/88 | HR 85 | Temp 98.3°F | Resp 16 | Wt 252.0 lb

## 2015-03-14 DIAGNOSIS — R739 Hyperglycemia, unspecified: Secondary | ICD-10-CM | POA: Diagnosis not present

## 2015-03-14 DIAGNOSIS — E785 Hyperlipidemia, unspecified: Secondary | ICD-10-CM

## 2015-03-14 DIAGNOSIS — R6889 Other general symptoms and signs: Secondary | ICD-10-CM

## 2015-03-14 DIAGNOSIS — Z8673 Personal history of transient ischemic attack (TIA), and cerebral infarction without residual deficits: Secondary | ICD-10-CM | POA: Diagnosis not present

## 2015-03-14 DIAGNOSIS — Z87898 Personal history of other specified conditions: Secondary | ICD-10-CM

## 2015-03-14 DIAGNOSIS — J4521 Mild intermittent asthma with (acute) exacerbation: Secondary | ICD-10-CM

## 2015-03-14 LAB — TSH: TSH: 2.62 u[IU]/mL (ref 0.35–4.50)

## 2015-03-14 LAB — BASIC METABOLIC PANEL
BUN: 21 mg/dL (ref 6–23)
CHLORIDE: 102 meq/L (ref 96–112)
CO2: 29 meq/L (ref 19–32)
Calcium: 10.2 mg/dL (ref 8.4–10.5)
Creatinine, Ser: 0.94 mg/dL (ref 0.40–1.20)
GFR: 65.28 mL/min (ref >60.00)
Glucose, Bld: 114 mg/dL — ABNORMAL HIGH (ref 70–99)
POTASSIUM: 4.6 meq/L (ref 3.5–5.1)
Sodium: 139 mEq/L (ref 135–145)

## 2015-03-14 LAB — URINALYSIS
BILIRUBIN URINE: NEGATIVE
HGB URINE DIPSTICK: NEGATIVE
KETONES UR: NEGATIVE
Leukocytes, UA: NEGATIVE
Nitrite: NEGATIVE
SPECIFIC GRAVITY, URINE: 1.02 (ref 1.000–1.030)
Total Protein, Urine: NEGATIVE
Urine Glucose: NEGATIVE
Urobilinogen, UA: 0.2 (ref 0.0–1.0)
pH: 5.5 (ref 5.0–8.0)

## 2015-03-14 LAB — HEMOGLOBIN A1C: HEMOGLOBIN A1C: 5.9 % (ref 4.6–6.5)

## 2015-03-14 NOTE — Progress Notes (Signed)
Pre visit review using our clinic review tool, if applicable. No additional management support is needed unless otherwise documented below in the visit note. 

## 2015-03-14 NOTE — Assessment & Plan Note (Signed)
NMR Lipoprofile

## 2015-03-14 NOTE — Assessment & Plan Note (Signed)
EC ASA 81 mg qd recommended

## 2015-03-14 NOTE — Assessment & Plan Note (Signed)
Renew rescue MDI

## 2015-03-14 NOTE — Progress Notes (Signed)
   Subjective:    Patient ID: [name], female    DOB: [birth year], 59 y.o.   MRN: [id number]  HPI The patient is here to assess status of active health conditions.  PMH, FH, & Social History reviewed & updated.No change in [place] as recorded. Significantly her paternal grandfather had heart attack at 58.  She has a past history of TIA. She was previously on enteric-coated aspirin 325 mg daily but stopped this on her own.  She is not on a heart healthy diet. She does not eat excess salt. She is unable to exercise due to pain in the right foot. She had bunion surgery 2015. She's had constant pain since. She is to have repeat surgery by Dr.Nunly at [facility] in October.  She does describe exertional dyspnea which she blames on "being fat". She describes edema only of the right foot where she had surgery.  She has nocturia 5.  She has significant heat intolerance.  Reactive airways is well controlled. She only uses her rescue inhaler with ragweed exposure or with barometric changes in the Summer.   Review of Systems  Chest pain, palpitations, tachycardia, paroxysmal nocturnal dyspnea, or claudication  are absent. No unexplained weight loss, abdominal pain, significant dyspepsia, dysphagia, melena, rectal bleeding, or persistently small caliber stools. Dysuria, pyuria, hematuria, frequency,or polyuria are denied. Change in hair, skin, nails denied. No bowel changes of constipation or diarrhea. No intolerance to cold.      Objective:   Physical Exam  Pertinent or positive findings include:BMI > 42. Crepitus knees.Lipomatous ankle changes; R >L. General appearance :adequately nourished; in no distress.  Eyes: No conjunctival inflammation or scleral icterus is present.  Oral exam:  Lips and gums are healthy appearing.There is no oropharyngeal erythema or exudate noted. Dental hygiene is good.  Heart:  Normal rate and regular rhythm. S1 and S2 normal without gallop, murmur, click, rub or  other extra sounds    Lungs:Chest clear to auscultation; no wheezes, rhonchi,rales ,or rubs present.No increased work of breathing.   Abdomen: bowel sounds normal, soft and non-tender without masses, organomegaly or hernias noted.  No guarding or rebound.   Vascular : all pulses equal ; no bruits present.  Skin:Warm & dry.  Intact without suspicious lesions or rashes ; no tenting or jaundice   Lymphatic: No lymphadenopathy is noted about the head, neck, axilla.   Neuro: Strength, tone & DTRs normal.         Assessment & Plan:  See Current Assessment & Plan in Problem List under specific Diagnosis

## 2015-03-14 NOTE — Patient Instructions (Signed)
  Your next office appointment will be determined based upon review of your pending labs. Those written interpretation of the lab results and instructions will be transmitted to you by My Chart  Critical results will be called.   Followup as needed for any active or acute issue. Please report any significant change in your symptoms. 

## 2015-03-14 NOTE — Assessment & Plan Note (Signed)
BMET A1c

## 2015-03-16 LAB — NMR LIPOPROFILE WITH LIPIDS
CHOLESTEROL, TOTAL: 255 mg/dL — AB (ref 100–199)
HDL Particle Number: 28.8 umol/L — ABNORMAL LOW (ref >=30.5)
HDL Size: 8.8 nm — ABNORMAL LOW (ref >=9.2)
HDL-C: 49 mg/dL (ref >39)
LDL CALC: 163 mg/dL — AB (ref 0–99)
LDL PARTICLE NUMBER: 2234 nmol/L — AB (ref <1000)
LDL Size: 21.7 nm (ref >=20.8)
LP-IR SCORE: 63 — AB (ref <=45)
Large HDL-P: 4 umol/L — ABNORMAL LOW (ref >=4.8)
Large VLDL-P: 9.3 nmol/L — ABNORMAL HIGH (ref <=2.7)
SMALL LDL PARTICLE NUMBER: 661 nmol/L — AB (ref <=527)
TRIGLYCERIDES: 217 mg/dL — AB (ref 0–149)
VLDL SIZE: 51.4 nm — AB (ref <=46.6)

## 2015-03-17 ENCOUNTER — Encounter: Payer: 59 | Admitting: Internal Medicine

## 2015-04-04 HISTORY — PX: FOOT SURGERY: SHX648

## 2015-04-07 ENCOUNTER — Telehealth: Payer: Self-pay | Admitting: Emergency Medicine

## 2015-04-07 NOTE — Telephone Encounter (Signed)
Health Screening paperwork has been faxed and mailed to pt.

## 2015-05-02 ENCOUNTER — Encounter: Payer: Self-pay | Admitting: Gastroenterology

## 2015-05-05 ENCOUNTER — Ambulatory Visit: Payer: 59 | Admitting: Internal Medicine

## 2015-05-29 ENCOUNTER — Ambulatory Visit (AMBULATORY_SURGERY_CENTER): Payer: Self-pay | Admitting: *Deleted

## 2015-05-29 VITALS — Ht 63.0 in | Wt 252.0 lb

## 2015-05-29 DIAGNOSIS — Z1211 Encounter for screening for malignant neoplasm of colon: Secondary | ICD-10-CM

## 2015-05-29 MED ORDER — NA SULFATE-K SULFATE-MG SULF 17.5-3.13-1.6 GM/177ML PO SOLN: 1.0000 | Freq: Once | ORAL | Status: DC

## 2015-05-29 NOTE — Progress Notes (Signed)
No egg or soy allergy No issues with past sedation No diet pills No home 02 use emmi video to e mail  

## 2015-06-12 ENCOUNTER — Encounter: Payer: 59 | Admitting: Gastroenterology

## 2015-07-13 DIAGNOSIS — M1812 Unilateral primary osteoarthritis of first carpometacarpal joint, left hand: Secondary | ICD-10-CM | POA: Insufficient documentation

## 2015-07-28 HISTORY — PX: FINGER SURGERY: SHX640

## 2015-07-28 HISTORY — PX: OTHER SURGICAL HISTORY: SHX169

## 2015-10-24 ENCOUNTER — Encounter: Payer: Self-pay | Admitting: Gastroenterology

## 2015-11-23 ENCOUNTER — Encounter: Payer: Self-pay | Admitting: Internal Medicine

## 2015-11-23 ENCOUNTER — Ambulatory Visit (INDEPENDENT_AMBULATORY_CARE_PROVIDER_SITE_OTHER): Payer: Commercial Managed Care - HMO | Admitting: Internal Medicine

## 2015-11-23 VITALS — BP 122/80 | HR 78 | Temp 98.5°F | Resp 18 | Ht 63.0 in | Wt 252.0 lb

## 2015-11-23 DIAGNOSIS — Z0000 Encounter for general adult medical examination without abnormal findings: Secondary | ICD-10-CM | POA: Diagnosis not present

## 2015-11-23 DIAGNOSIS — R197 Diarrhea, unspecified: Secondary | ICD-10-CM

## 2015-11-23 NOTE — Progress Notes (Signed)
Pre visit review using our clinic review tool, if applicable. No additional management support is needed unless otherwise documented below in the visit note. 

## 2015-11-23 NOTE — Patient Instructions (Signed)
We are checking for C dif with the diarrhea and will get the results within 1-2 days. We are also checking the labs and will get back with you as well.   We are sorry about the lack of availability and are working hard to correct that problem quickly. This is very important to [place] as well.   Health Maintenance, Female Adopting a healthy lifestyle and getting preventive care can go a long way to promote health and wellness. Talk with your health care provider about what schedule of regular examinations is right for you. This is a good chance for you to check in with your provider about disease prevention and staying healthy. In between checkups, there are plenty of things you can do on your own. Experts have done a lot of research about which lifestyle changes and preventive measures are most likely to keep you healthy. Ask your health care provider for more information. WEIGHT AND DIET  Eat a healthy diet  Be sure to include plenty of vegetables, fruits, low-fat dairy products, and lean protein.  Do not eat a lot of foods high in solid fats, added sugars, or salt.  Get regular exercise. This is one of the most important things you can do for your health.  Most adults should exercise for at least 150 minutes each week. The exercise should increase your heart rate and make you sweat (moderate-intensity exercise).  Most adults should also do strengthening exercises at least twice a week. This is in addition to the moderate-intensity exercise.  Maintain a healthy weight  Body mass index (BMI) is a measurement that can be used to identify possible weight problems. It estimates body fat based on height and weight. Your health care provider can help determine your BMI and help you achieve or maintain a healthy weight.  For females 77 years of age and older:   A BMI below 18.5 is considered underweight.  A BMI of 18.5 to 24.9 is normal.  A BMI of 25 to 29.9 is considered overweight.  A BMI of  30 and above is considered obese.  Watch levels of cholesterol and blood lipids  You should start having your blood tested for lipids and cholesterol at 58 years of age, then have this test every 5 years.  You may need to have your cholesterol levels checked more often if:  Your lipid or cholesterol levels are high.  You are older than 58 years of age.  You are at high risk for heart disease.  CANCER SCREENING   Lung Cancer  Lung cancer screening is recommended for adults 80-61 years old who are at high risk for lung cancer because of a history of smoking.  A yearly low-dose CT scan of the lungs is recommended for people who:  Currently smoke.  Have quit within the past 15 years.  Have at least a 30-pack-year history of smoking. A pack year is smoking an average of one pack of cigarettes a day for 1 year.  Yearly screening should continue until it has been 15 years since you quit.  Yearly screening should stop if you develop a health problem that would prevent you from having lung cancer treatment.  Breast Cancer  Practice breast self-awareness. This means understanding how your breasts normally appear and feel.  It also means doing regular breast self-exams. Let your health care provider know about any changes, no matter how small.  If you are in your 20s or 30s, you should have a clinical breast  exam (CBE) by a health care provider every 1-3 years as part of a regular health exam.  If you are 24 or older, have a CBE every year. Also consider having a breast X-ray (mammogram) every year.  If you have a family history of breast cancer, talk to your health care provider about genetic screening.  If you are at high risk for breast cancer, talk to your health care provider about having an MRI and a mammogram every year.  Breast cancer gene (BRCA) assessment is recommended for women who have family members with BRCA-related cancers. BRCA-related cancers  include:  Breast.  Ovarian.  Tubal.  Peritoneal cancers.  Results of the assessment will determine the need for genetic counseling and BRCA1 and BRCA2 testing. Cervical Cancer Your health care provider may recommend that you be screened regularly for cancer of the pelvic organs (ovaries, uterus, and vagina). This screening involves a pelvic examination, including checking for microscopic changes to the surface of your cervix (Pap test). You may be encouraged to have this screening done every 3 years, beginning at age 78.  For women ages 68-65, health care providers may recommend pelvic exams and Pap testing every 3 years, or they may recommend the Pap and pelvic exam, combined with testing for human papilloma virus (HPV), every 5 years. Some types of HPV increase your risk of cervical cancer. Testing for HPV may also be done on women of any age with unclear Pap test results.  Other health care providers may not recommend any screening for nonpregnant women who are considered low risk for pelvic cancer and who do not have symptoms. Ask your health care provider if a screening pelvic exam is right for you.  If you have had past treatment for cervical cancer or a condition that could lead to cancer, you need Pap tests and screening for cancer for at least 20 years after your treatment. If Pap tests have been discontinued, your risk factors (such as having a new sexual partner) need to be reassessed to determine if screening should resume. Some women have medical problems that increase the chance of getting cervical cancer. In these cases, your health care provider may recommend more frequent screening and Pap tests. Colorectal Cancer  This type of cancer can be detected and often prevented.  Routine colorectal cancer screening usually begins at 58 years of age and continues through 58 years of age.  Your health care provider may recommend screening at an earlier age if you have risk factors for  colon cancer.  Your health care provider may also recommend using home test kits to check for hidden blood in the stool.  A small camera at the end of a tube can be used to examine your colon directly (sigmoidoscopy or colonoscopy). This is done to check for the earliest forms of colorectal cancer.  Routine screening usually begins at age 55.  Direct examination of the colon should be repeated every 5-10 years through 58 years of age. However, you may need to be screened more often if early forms of precancerous polyps or small growths are found. Skin Cancer  Check your skin from head to toe regularly.  Tell your health care provider about any new moles or changes in moles, especially if there is a change in a mole's shape or color.  Also tell your health care provider if you have a mole that is larger than the size of a pencil eraser.  Always use sunscreen. Apply sunscreen liberally and repeatedly  throughout the day.  Protect yourself by wearing long sleeves, pants, a wide-brimmed hat, and sunglasses whenever you are outside. HEART DISEASE, DIABETES, AND HIGH BLOOD PRESSURE   High blood pressure causes heart disease and increases the risk of stroke. High blood pressure is more likely to develop in:  People who have blood pressure in the high end of the normal range (130-139/85-89 mm Hg).  People who are overweight or obese.  People who are African American.  If you are 61-37 years of age, have your blood pressure checked every 3-5 years. If you are 49 years of age or older, have your blood pressure checked every year. You should have your blood pressure measured twice--once when you are at a hospital or clinic, and once when you are not at a hospital or clinic. Record the average of the two measurements. To check your blood pressure when you are not at a hospital or clinic, you can use:  An automated blood pressure machine at a pharmacy.  A home blood pressure monitor.  If you  are between 68 years and 50 years old, ask your health care provider if you should take aspirin to prevent strokes.  Have regular diabetes screenings. This involves taking a blood sample to check your fasting blood sugar level.  If you are at a normal weight and have a low risk for diabetes, have this test once every three years after 58 years of age.  If you are overweight and have a high risk for diabetes, consider being tested at a younger age or more often. PREVENTING INFECTION  Hepatitis B  If you have a higher risk for hepatitis B, you should be screened for this virus. You are considered at high risk for hepatitis B if:  You were born in a country where hepatitis B is common. Ask your health care provider which countries are considered high risk.  Your parents were born in a high-risk country, and you have not been immunized against hepatitis B (hepatitis B vaccine).  You have HIV or AIDS.  You use needles to inject street drugs.  You live with someone who has hepatitis B.  You have had sex with someone who has hepatitis B.  You get hemodialysis treatment.  You take certain medicines for conditions, including cancer, organ transplantation, and autoimmune conditions. Hepatitis C  Blood testing is recommended for:  Everyone born from [birth year] through 1963.  Anyone with known risk factors for hepatitis C. Sexually transmitted infections (STIs)  You should be screened for sexually transmitted infections (STIs) including gonorrhea and chlamydia if:  You are sexually active and are younger than 58 years of age.  You are older than 58 years of age and your health care provider tells you that you are at risk for this type of infection.  Your sexual activity has changed since you were last screened and you are at an increased risk for chlamydia or gonorrhea. Ask your health care provider if you are at risk.  If you do not have HIV, but are at risk, it may be recommended that you  take a prescription medicine daily to prevent HIV infection. This is called pre-exposure prophylaxis (PrEP). You are considered at risk if:  You are sexually active and do not regularly use condoms or know the HIV status of your partner(s).  You take drugs by injection.  You are sexually active with a partner who has HIV. Talk with your health care provider about whether you are at high  risk of being infected with HIV. If you choose to begin PrEP, you should first be tested for HIV. You should then be tested every 3 months for as long as you are taking PrEP.  PREGNANCY   If you are premenopausal and you may become pregnant, ask your health care provider about preconception counseling.  If you may become pregnant, take 400 to 800 micrograms (mcg) of folic acid every day.  If you want to prevent pregnancy, talk to your health care provider about birth control (contraception). OSTEOPOROSIS AND MENOPAUSE   Osteoporosis is a disease in which the bones lose minerals and strength with aging. This can result in serious bone fractures. Your risk for osteoporosis can be identified using a bone density scan.  If you are 42 years of age or older, or if you are at risk for osteoporosis and fractures, ask your health care provider if you should be screened.  Ask your health care provider whether you should take a calcium or vitamin D supplement to lower your risk for osteoporosis.  Menopause may have certain physical symptoms and risks.  Hormone replacement therapy may reduce some of these symptoms and risks. Talk to your health care provider about whether hormone replacement therapy is right for you.  HOME CARE INSTRUCTIONS   Schedule regular health, dental, and eye exams.  Stay current with your immunizations.   Do not use any tobacco products including cigarettes, chewing tobacco, or electronic cigarettes.  If you are pregnant, do not drink alcohol.  If you are breastfeeding, limit how  much and how often you drink alcohol.  Limit alcohol intake to no more than 1 drink per day for nonpregnant women. One drink equals 12 ounces of beer, 5 ounces of wine, or 1 ounces of hard liquor.  Do not use street drugs.  Do not share needles.  Ask your health care provider for help if you need support or information about quitting drugs.  Tell your health care provider if you often feel depressed.  Tell your health care provider if you have ever been abused or do not feel safe at home.   This information is not intended to replace advice given to you by your health care provider. Make sure you discuss any questions you have with your health care provider.   Document Released: 02/04/2011 Document Revised: 08/12/2014 Document Reviewed: 06/23/2013 Elsevier Interactive Patient Education [employer].

## 2015-11-24 NOTE — Assessment & Plan Note (Addendum)
Seeing GI soon for colonoscopy (has never had). C dif ordered today but she was unable to give sample. Does not want any immunizations. Pap smear and mammogram up to date.

## 2015-11-24 NOTE — Progress Notes (Signed)
   Subjective:    Patient ID: [name], female    DOB: Mar 12, 1956, 60 y.o.   MRN: [id number]  HPI The patient is a 60 YO female coming in for wellness. Some new concerns that she does not want to discuss. GI issues and diarrhea since taking antibiotics several months ago. Explosive after eating and limits when she is able to eat.   PMH, [facility], social history reviewed and updated.   Review of Systems  Constitutional: Negative for fever, appetite change, fatigue and unexpected weight change.  HENT: Negative for facial swelling, hearing loss and trouble swallowing.   Respiratory: Negative for cough, chest tightness, shortness of breath and wheezing.   Cardiovascular: Negative for chest pain, palpitations and leg swelling.  Gastrointestinal: Positive for diarrhea. Negative for nausea, vomiting, abdominal pain, constipation and abdominal distention.  Musculoskeletal: Negative.   Skin: Negative.   Neurological: Negative.   Psychiatric/Behavioral: Negative.       Objective:   Physical Exam  Constitutional: She appears well-developed and well-nourished.  HENT:  Head: Normocephalic and atraumatic.  Eyes: EOM are normal.  Neck: Normal range of motion.  Cardiovascular: Normal rate and regular rhythm.   Pulmonary/Chest: Effort normal and breath sounds normal. No respiratory distress. She has no wheezes. She has no rales.  Abdominal: Soft. Bowel sounds are normal. She exhibits no distension. There is no tenderness. There is no rebound.  Lymphadenopathy:    She has no cervical adenopathy.  Skin: Skin is warm and dry.   Filed Vitals:   11/23/15 1529  BP: 122/80  Pulse: 78  Temp: 98.5 F (36.9 C)  TempSrc: Oral  Resp: 18  Height: 5\' 3"  (1.6 m)  Weight: 252 lb (114.306 kg)  SpO2: 97%      Assessment & Plan:

## 2015-12-04 ENCOUNTER — Ambulatory Visit (AMBULATORY_SURGERY_CENTER): Payer: Self-pay | Admitting: *Deleted

## 2015-12-04 ENCOUNTER — Encounter: Payer: Self-pay | Admitting: Gastroenterology

## 2015-12-04 VITALS — Ht 63.0 in | Wt 252.0 lb

## 2015-12-04 DIAGNOSIS — Z1211 Encounter for screening for malignant neoplasm of colon: Secondary | ICD-10-CM

## 2015-12-04 MED ORDER — NA SULFATE-K SULFATE-MG SULF 17.5-3.13-1.6 GM/177ML PO SOLN: 1.0000 | Freq: Once | ORAL | Status: DC

## 2015-12-04 NOTE — Progress Notes (Signed)
No egg or soy allergy known to patient  No issues with past sedation with any surgeries  or procedures, no intubation problems  No diet pills per patient No home 02 use per patient  No blood thinners per patient  Pt denies issues with constipation   

## 2015-12-18 ENCOUNTER — Ambulatory Visit (AMBULATORY_SURGERY_CENTER): Payer: Commercial Managed Care - HMO | Admitting: Gastroenterology

## 2015-12-18 ENCOUNTER — Encounter: Payer: Self-pay | Admitting: Gastroenterology

## 2015-12-18 VITALS — BP 124/78 | HR 72 | Temp 99.1°F | Resp 20 | Ht 63.0 in | Wt 252.0 lb

## 2015-12-18 DIAGNOSIS — Z538 Procedure and treatment not carried out for other reasons: Secondary | ICD-10-CM

## 2015-12-18 DIAGNOSIS — Z1211 Encounter for screening for malignant neoplasm of colon: Secondary | ICD-10-CM

## 2015-12-18 MED ORDER — SODIUM CHLORIDE 0.9 % IV SOLN: 500.0000 mL | INTRAVENOUS | Status: DC

## 2015-12-18 NOTE — Op Note (Signed)
[place] Patient Name: [name] Procedure Date: 12/18/2015 10:14 AM MRN: [id number] Endoscopist: Milus Banister , MD Age: 60 Referring MD:  Date of Birth: 26-Feb-1956 Gender: Female Procedure:                Colonoscopy Indications:              Screening for colorectal malignant neoplasm Medicines:                Monitored Anesthesia Care Procedure:                Pre-Anesthesia Assessment:                           - Prior to the procedure, a History and Physical                            was performed, and patient medications and                            allergies were reviewed. The patient's tolerance of                            previous anesthesia was also reviewed. The risks                            and benefits of the procedure and the sedation                            options and risks were discussed with the patient.                            All questions were answered, and informed consent                            was obtained. Prior Anticoagulants: The patient has                            taken no previous anticoagulant or antiplatelet                            agents. ASA Grade Assessment: II - A patient with                            mild systemic disease. After reviewing the risks                            and benefits, the patient was deemed in                            satisfactory condition to undergo the procedure.                           After obtaining informed consent, the colonoscope  was passed under direct vision. Throughout the                            procedure, the patient's blood pressure, pulse, and                            oxygen saturations were monitored continuously. The                            Model CF-HQ190L [id number]) scope was introduced                            through the anus with the intention of advancing to                            the cecum. The scope was  advanced to the ascending                            colon before the procedure was aborted. Medications                            were given. The colonoscopy was performed without                            difficulty. The patient tolerated the procedure                            well. The quality of the bowel preparation was                            poor. No anatomical landmarks were photographed. Scope In: 10:18:20 AM Scope Out: 10:23:08 AM Total Procedure Duration: 0 hours 4 minutes 48 seconds  Findings:                 A large amount of stool was found in the ascending                            colon, rectum, sigmoid, precluding adequate                            visualization. Complications:            No immediate complications. Estimated blood loss:                            None. Estimated Blood Loss:     Estimated blood loss: none. Impression:               - Preparation of the colon was poor.                           - Stool in the ascending, sigmoid, rectum segments                            of the colon.                           -  No specimens collected. Recommendation:           - Patient has a contact number available for                            emergencies. The signs and symptoms of potential                            delayed complications were discussed with the                            patient. Return to normal activities tomorrow.                            Written discharge instructions were provided to the                            patient.                           - Resume previous diet.                           - Continue present medications.                           - Repeat colonoscopy at the next available                            appointment because the bowel preparation was poor.                            My office will get in touch with you about                            alternative prep (Miralax/Gatorade split dose prep). Milus Banister, MD 12/18/2015 10:29:19 AM This report has been signed electronically.

## 2015-12-18 NOTE — Patient Instructions (Signed)
Discharge instructions given. Incomplete exam. Patient rescheduled for a repeat colonoscopy. Resume previous medications. YOU HAD AN ENDOSCOPIC PROCEDURE TODAY AT [facility]:   Refer to the procedure report that was given to you for any specific questions about what was found during the examination.  If the procedure report does not answer your questions, please call your gastroenterologist to clarify.  If you requested that your care partner not be given the details of your procedure findings, then the procedure report has been included in a sealed envelope for you to review at your convenience later.  YOU SHOULD EXPECT: Some feelings of bloating in the abdomen. Passage of more gas than usual.  Walking can help get rid of the air that was put into your GI tract during the procedure and reduce the bloating. If you had a lower endoscopy (such as a colonoscopy or flexible sigmoidoscopy) you may notice spotting of blood in your stool or on the toilet paper. If you underwent a bowel prep for your procedure, you may not have a normal bowel movement for a few days.  Please Note:  You might notice some irritation and congestion in your nose or some drainage.  This is from the oxygen used during your procedure.  There is no need for concern and it should clear up in a day or so.  SYMPTOMS TO REPORT IMMEDIATELY:   Following lower endoscopy (colonoscopy or flexible sigmoidoscopy):  Excessive amounts of blood in the stool  Significant tenderness or worsening of abdominal pains  Swelling of the abdomen that is new, acute  Fever of 100F or higher   For urgent or emergent issues, a gastroenterologist can be reached at any hour by calling [phone].   DIET: Your first meal following the procedure should be a small meal and then it is ok to progress to your normal diet. Heavy or fried foods are harder to digest and may make you feel nauseous or bloated.  Likewise, meals heavy in dairy  and vegetables can increase bloating.  Drink plenty of fluids but you should avoid alcoholic beverages for 24 hours.  ACTIVITY:  You should plan to take it easy for the rest of today and you should NOT DRIVE or use heavy machinery until tomorrow (because of the sedation medicines used during the test).    FOLLOW UP: Our staff will call the number listed on your records the next business day following your procedure to check on you and address any questions or concerns that you may have regarding the information given to you following your procedure. If we do not reach you, we will leave a message.  However, if you are feeling well and you are not experiencing any problems, there is no need to return our call.  We will assume that you have returned to your regular daily activities without incident.  If any biopsies were taken you will be contacted by phone or by letter within the next 1-3 weeks.  Please call us at [phone] if you have not heard about the biopsies in 3 weeks.    SIGNATURES/CONFIDENTIALITY: You and/or your care partner have signed paperwork which will be entered into your electronic medical record.  These signatures attest to the fact that that the information above on your After Visit Summary has been reviewed and is understood.  Full responsibility of the confidentiality of this discharge information lies with you and/or your care-partner.

## 2015-12-18 NOTE — Progress Notes (Signed)
Report to PACU, RN, vss, BBS= Clear.  

## 2015-12-19 ENCOUNTER — Telehealth: Payer: Self-pay

## 2015-12-19 NOTE — Telephone Encounter (Signed)
  Follow up Call-  Call back number 12/18/2015  Post procedure Call Back phone  # [phone]  Permission to leave phone message Yes     Patient questions:  Do you have a fever, pain , or abdominal swelling? No. Pain Score  0 *  Have you tolerated food without any problems? Yes.    Have you been able to return to your normal activities? Yes.    Do you have any questions about your discharge instructions: Diet   No. Medications  No. Follow up visit  No.  Do you have questions or concerns about your Care? No.  Actions: * If pain score is 4 or above: No action needed, pain <4.

## 2015-12-29 ENCOUNTER — Ambulatory Visit (AMBULATORY_SURGERY_CENTER): Payer: Self-pay | Admitting: *Deleted

## 2015-12-29 VITALS — Ht 63.0 in | Wt 252.6 lb

## 2015-12-29 DIAGNOSIS — Z1211 Encounter for screening for malignant neoplasm of colon: Secondary | ICD-10-CM

## 2015-12-29 NOTE — Progress Notes (Signed)
No allergies to eggs or soy. No problems with anesthesia.  Pt not given Emmi instructions for colonoscopy; recently had colonoscopy  No oxygen use  No diet drug use

## 2016-01-05 ENCOUNTER — Encounter: Payer: Self-pay | Admitting: Gastroenterology

## 2016-01-10 ENCOUNTER — Telehealth: Payer: Self-pay | Admitting: Geriatric Medicine

## 2016-01-10 DIAGNOSIS — E785 Hyperlipidemia, unspecified: Secondary | ICD-10-CM

## 2016-01-10 NOTE — Telephone Encounter (Signed)
Patient needs a lipid panel drawn for a form to be completed for her job. She will be here on Friday and wants to know if you would put orders in for her to have it drawn while she is here. Please advise, thanks.

## 2016-01-11 NOTE — Telephone Encounter (Signed)
Order placed

## 2016-01-11 NOTE — Telephone Encounter (Signed)
Left message on voice mail informing patient that the orders have been placed.

## 2016-01-12 ENCOUNTER — Encounter: Payer: Self-pay | Admitting: Gastroenterology

## 2016-01-12 ENCOUNTER — Ambulatory Visit: Payer: Commercial Managed Care - HMO | Admitting: Gastroenterology

## 2016-01-12 ENCOUNTER — Other Ambulatory Visit (INDEPENDENT_AMBULATORY_CARE_PROVIDER_SITE_OTHER): Payer: Commercial Managed Care - HMO

## 2016-01-12 VITALS — BP 139/85 | HR 71 | Temp 97.8°F | Resp 15 | Ht 63.0 in | Wt 252.0 lb

## 2016-01-12 DIAGNOSIS — E785 Hyperlipidemia, unspecified: Secondary | ICD-10-CM | POA: Diagnosis not present

## 2016-01-12 DIAGNOSIS — D125 Benign neoplasm of sigmoid colon: Secondary | ICD-10-CM | POA: Diagnosis not present

## 2016-01-12 DIAGNOSIS — Z1211 Encounter for screening for malignant neoplasm of colon: Secondary | ICD-10-CM | POA: Diagnosis not present

## 2016-01-12 LAB — LIPID PANEL
Cholesterol: 265 mg/dL — ABNORMAL HIGH (ref 0–200)
HDL: 38.2 mg/dL — AB (ref >39.00)
NONHDL: 226.73
TRIGLYCERIDES: 267 mg/dL — AB (ref 0.0–149.0)
Total CHOL/HDL Ratio: 7
VLDL: 53.4 mg/dL — ABNORMAL HIGH (ref 0.0–40.0)

## 2016-01-12 LAB — LDL CHOLESTEROL, DIRECT: LDL DIRECT: 192 mg/dL

## 2016-01-12 MED ORDER — SODIUM CHLORIDE 0.9 % IV SOLN: 500.0000 mL | INTRAVENOUS | Status: DC

## 2016-01-12 NOTE — Progress Notes (Signed)
Called to room to assist during endoscopic procedure.  Patient ID and intended procedure confirmed with present staff. Received instructions for my participation in the procedure from the performing physician.  

## 2016-01-12 NOTE — Patient Instructions (Addendum)
YOU HAD AN ENDOSCOPIC PROCEDURE TODAY AT THE  ENDOSCOPY CENTER:   Refer to the procedure report that was given to you for any specific questions about what was found during the examination.  If the procedure report does not answer your questions, please call your gastroenterologist to clarify.  If you requested that your care partner not be given the details of your procedure findings, then the procedure report has been included in a sealed envelope for you to review at your convenience later.  YOU SHOULD EXPECT: Some feelings of bloating in the abdomen. Passage of more gas than usual.  Walking can help get rid of the air that was put into your GI tract during the procedure and reduce the bloating. If you had a lower endoscopy (such as a colonoscopy or flexible sigmoidoscopy) you may notice spotting of blood in your stool or on the toilet paper. If you underwent a bowel prep for your procedure, you may not have a normal bowel movement for a few days.  Please Note:  You might notice some irritation and congestion in your nose or some drainage.  This is from the oxygen used during your procedure.  There is no need for concern and it should clear up in a day or so.  SYMPTOMS TO REPORT IMMEDIATELY:   Following lower endoscopy (colonoscopy or flexible sigmoidoscopy):  Excessive amounts of blood in the stool  Significant tenderness or worsening of abdominal pains  Swelling of the abdomen that is new, acute  Fever of 100F or higher    For urgent or emergent issues, a gastroenterologist can be reached at any hour by calling [phone].   DIET: Your first meal following the procedure should be a small meal and then it is ok to progress to your normal diet. Heavy or fried foods are harder to digest and may make you feel nauseous or bloated.  Likewise, meals heavy in dairy and vegetables can increase bloating.  Drink plenty of fluids but you should avoid alcoholic beverages for 24  hours.  ACTIVITY:  You should plan to take it easy for the rest of today and you should NOT DRIVE or use heavy machinery until tomorrow (because of the sedation medicines used during the test).    FOLLOW UP: Our staff will call the number listed on your records the next business day following your procedure to check on you and address any questions or concerns that you may have regarding the information given to you following your procedure. If we do not reach you, we will leave a message.  However, if you are feeling well and you are not experiencing any problems, there is no need to return our call.  We will assume that you have returned to your regular daily activities without incident.  If any biopsies were taken you will be contacted by phone or by letter within the next 1-3 weeks.  Please call us at [phone] if you have not heard about the biopsies in 3 weeks.    SIGNATURES/CONFIDENTIALITY: You and/or your care partner have signed paperwork which will be entered into your electronic medical record.  These signatures attest to the fact that that the information above on your After Visit Summary has been reviewed and is understood.  Full responsibility of the confidentiality of this discharge information lies with you and/or your care-partner.   Information on polyps,diverticulosis ,and high fiber diet given to you today 

## 2016-01-12 NOTE — Progress Notes (Signed)
Report to PACU, RN, vss, BBS= Clear.  

## 2016-01-12 NOTE — Op Note (Signed)
[facility] Patient Name: [name] Procedure Date: 01/12/2016 2:50 PM MRN: [id number] Endoscopist: Milus Banister , MD Age: 60 Referring MD:  Date of Birth: 1956/04/23 Gender: Female Procedure:                Colonoscopy Indications:              Screening for colorectal malignant neoplasm Medicines:                Monitored Anesthesia Care Procedure:                Pre-Anesthesia Assessment:                           - Prior to the procedure, a History and Physical                            was performed, and patient medications and                            allergies were reviewed. The patient's tolerance of                            previous anesthesia was also reviewed. The risks                            and benefits of the procedure and the sedation                            options and risks were discussed with the patient.                            All questions were answered, and informed consent                            was obtained. Prior Anticoagulants: The patient has                            taken no previous anticoagulant or antiplatelet                            agents. ASA Grade Assessment: II - A patient with                            mild systemic disease. After reviewing the risks                            and benefits, the patient was deemed in                            satisfactory condition to undergo the procedure.                           After obtaining informed consent, the colonoscope  was passed under direct vision. Throughout the                            procedure, the patient's blood pressure, pulse, and                            oxygen saturations were monitored continuously. The                            Model CF-HQ190L [id number]) scope was introduced                            through the anus and advanced to the the cecum,                            identified by appendiceal orifice and  ileocecal                            valve. The colonoscopy was performed without                            difficulty. The patient tolerated the procedure                            well. The quality of the bowel preparation was                            excellent. The ileocecal valve, appendiceal                            orifice, and rectum were photographed. Scope In: 2:56:48 PM Scope Out: [id number] PM Scope Withdrawal Time: 0 hours 6 minutes 22 seconds  Total Procedure Duration: 0 hours 9 minutes 58 seconds  Findings:                 A 6 mm polyp was found in the sigmoid colon. The                            polyp was semi-pedunculated. The polyp was removed                            with a cold snare. Resection and retrieval were                            complete.                           Many small and large-mouthed diverticula were found                            in the left colon.                           The exam was otherwise without abnormality on  direct and retroflexion views. Complications:            No immediate complications. Estimated blood loss:                            None. Estimated Blood Loss:     Estimated blood loss: none. Impression:               - One 6 mm polyp in the sigmoid colon, removed with                            a cold snare. Resected and retrieved.                           - Diverticulosis in the left colon.                           - The examination was otherwise normal on direct                            and retroflexion views. Recommendation:           - Patient has a contact number available for                            emergencies. The signs and symptoms of potential                            delayed complications were discussed with the                            patient. Return to normal activities tomorrow.                            Written discharge instructions were provided to the                             patient.                           - Resume previous diet.                           - Continue present medications.                           You will receive a letter within 2-3 weeks with the                            pathology results and my final recommendations.                           If the polyp(s) is proven to be 'pre-cancerous' on                            pathology, you will need repeat colonoscopy in 5  years. If the polyp(s) is NOT 'precancerous' on                            pathology then you should repeat colon cancer                            screening in 10 years with colonoscopy without need                            for colon cancer screening by any method prior to                            then (including stool testing). Milus Banister, MD 01/12/2016 3:23:30 PM This report has been signed electronically.

## 2016-01-15 ENCOUNTER — Telehealth: Payer: Self-pay | Admitting: *Deleted

## 2016-01-15 NOTE — Telephone Encounter (Signed)
  Follow up Call-  Call back number 01/12/2016 12/18/2015  Post procedure Call Back phone  # [phone]  Permission to leave phone message Yes Yes     Patient questions:  Do you have a fever, pain , or abdominal swelling? No. Pain Score  0 *  Have you tolerated food without any problems? Yes.    Have you been able to return to your normal activities? Yes.    Do you have any questions about your discharge instructions: Diet   No. Medications  No. Follow up visit  No.  Do you have questions or concerns about your Care? No.  Actions: * If pain score is 4 or above: No action needed, pain <4.

## 2016-01-23 ENCOUNTER — Encounter: Payer: Self-pay | Admitting: Gastroenterology

## 2016-03-19 ENCOUNTER — Ambulatory Visit (INDEPENDENT_AMBULATORY_CARE_PROVIDER_SITE_OTHER): Payer: Commercial Managed Care - HMO | Admitting: Internal Medicine

## 2016-03-19 ENCOUNTER — Encounter: Payer: Self-pay | Admitting: Internal Medicine

## 2016-03-19 VITALS — BP 144/84 | HR 87 | Temp 99.3°F | Resp 18 | Ht 62.0 in | Wt 252.0 lb

## 2016-03-19 DIAGNOSIS — E785 Hyperlipidemia, unspecified: Secondary | ICD-10-CM | POA: Diagnosis not present

## 2016-03-19 DIAGNOSIS — J4521 Mild intermittent asthma with (acute) exacerbation: Secondary | ICD-10-CM

## 2016-03-19 DIAGNOSIS — E669 Obesity, unspecified: Secondary | ICD-10-CM | POA: Insufficient documentation

## 2016-03-19 HISTORY — DX: Morbid (severe) obesity due to excess calories: E66.01

## 2016-03-19 MED ORDER — PREDNISONE 20 MG PO TABS: 40.0000 mg | ORAL_TABLET | Freq: Every day | ORAL | 0 refills | Status: DC

## 2016-03-19 NOTE — Assessment & Plan Note (Signed)
Referral to lipid clinic. She has failed statins and has been resistant to taking other oral agents. Does want to help her cholesterol now.

## 2016-03-19 NOTE — Progress Notes (Signed)
Pre visit review using our clinic review tool, if applicable. No additional management support is needed unless otherwise documented below in the visit note. 

## 2016-03-19 NOTE — Progress Notes (Signed)
   Subjective:    Patient ID: [name], female    DOB: 19-Feb-1956, 60 y.o.   MRN: [id number]  HPI The patient is a 60 YO female coming in to talk about her cholesterol. It is very high and she has not been able to tolerate statins in the past (intolerance entered in 2011, adjusted in 2015). She has a past of a TIA and does not have known CAD. She is very concerned that due to her weight she is going to have a stroke in the future. She would also like to see a nutritionist to help.   She is also still having a sinus infection. She was treated with 2 different antibiotics over the last 2 weeks including cefnir and azithromycin (got diarrhea with cefnir). She is not feeling better. She is taking zyrtec only for allergies. Pressure in her sinuses and no fevers or chills. Going on 2-3 weeks. No cough but more SOB. No chest pains. Some ear discomfort.   Review of Systems  Constitutional: Negative for appetite change, fatigue, fever and unexpected weight change.  HENT: Positive for congestion, ear pain, rhinorrhea and sinus pressure. Negative for ear discharge, facial swelling, hearing loss, sore throat and trouble swallowing.   Eyes: Negative.   Respiratory: Positive for shortness of breath. Negative for cough, chest tightness and wheezing.   Cardiovascular: Negative for chest pain, palpitations and leg swelling.  Gastrointestinal: Negative for abdominal distention, abdominal pain, constipation, nausea and vomiting.  Musculoskeletal: Negative.   Skin: Negative.   Neurological: Negative.   Psychiatric/Behavioral: Negative.       Objective:   Physical Exam  Constitutional: She appears well-developed and well-nourished.  HENT:  Head: Normocephalic and atraumatic.  Some redness at the oropharynx and nares without crusting.  Eyes: EOM are normal.  Neck: Normal range of motion.  Shotty LAD in the cervical  Cardiovascular: Normal rate and regular rhythm.   Pulmonary/Chest: Effort normal and  breath sounds normal. No respiratory distress. She has no wheezes. She has no rales.  Abdominal: Soft. Bowel sounds are normal. She exhibits no distension. There is no tenderness. There is no rebound.  Lymphadenopathy:    She has cervical adenopathy.  Skin: Skin is warm and dry.   Vitals:   03/19/16 0803  BP: (!) 144/84  Pulse: 87  Resp: 18  Temp: 99.3 F (37.4 C)  TempSrc: Oral  SpO2: 98%  Weight: 252 lb (114.3 kg)  Height: 5\' 2"  (1.575 m)      Assessment & Plan:

## 2016-03-19 NOTE — Assessment & Plan Note (Signed)
She wants to work on weight loss and referral to nutrition.

## 2016-03-19 NOTE — Assessment & Plan Note (Signed)
Treating for flare with prednisone short course. She has done 2 antibiotics and further are not needed today.

## 2016-03-19 NOTE — Patient Instructions (Signed)
We will get you in with the lipid clinic as well as the nutritionist.   We have sent in prednisone for the sinus infection. Take 2 pills a day for 6 days then stop.   Also consider using saline rinses in the sinuses to help clear them as the prednisone is drying them up.

## 2016-04-02 ENCOUNTER — Other Ambulatory Visit: Payer: Self-pay | Admitting: Internal Medicine

## 2016-04-02 DIAGNOSIS — E785 Hyperlipidemia, unspecified: Secondary | ICD-10-CM

## 2016-04-04 DIAGNOSIS — T85848A Pain due to other internal prosthetic devices, implants and grafts, initial encounter: Secondary | ICD-10-CM | POA: Insufficient documentation

## 2016-04-24 ENCOUNTER — Encounter: Payer: Commercial Managed Care - HMO | Attending: Internal Medicine | Admitting: Dietician

## 2016-04-24 DIAGNOSIS — Z713 Dietary counseling and surveillance: Secondary | ICD-10-CM | POA: Diagnosis not present

## 2016-04-24 NOTE — Progress Notes (Signed)
Medical Nutrition Therapy:  Appt start time: 0730 end time:  0835.   Assessment:  Primary concerns today: [name] is here today since she is trying to lose weight. Weight has been overall going up but did lose 4 lbs recently mostly by drinking more water. Also has high cholesterol and not taking statins since she had cramps with them. May be going to the lipid clinic and an appointment with a cardiologist next week. Concerned that she is at risk for a stroke.   Has been able to lose weight with exercise last year but has had some injuries to her feet and bone spurs in her neck. Had 2 foot surgeries last year. Will be having another foot surgery tomorrow. Wants to eat right and not be on a diet anymore.   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 Lives with father and husband. Husband does not eat fish. She does the food shopping and meal preparation (along with cleaning). Father is 85 and requires a lot of care (on oxygen and takes a lot of medication). Has a lot of stress. Trying to plan meals ahead of time. Eats one meal per day (skips breakfast and lunch). When she lost weight last year was having a protein shake in the morning. Eats out about 3 x week. Not a sweet eaters. Eats a lot at night after dinner.   Gets really hot and has trouble walking in the summer. Hoping after surgery she will be walking soon after surgery tomorrow. Taking juice plus gummies which is helping with her energy levels.   Sleep about 3-4 hours per night. Going to [place] for an anniversary trip and meeting and old friend in October.   Preferred Learning Style:   No preference indicated   Learning Readiness:   Ready  MEDICATIONS: see list   DIETARY INTAKE:  Usual eating pattern includes 1 meals and 2 snacks per day.  Avoided foods include: liver, brown rice   24-hr recall:  B ( AM): none  Snk ( AM): none  L ( PM): none or  chicken/burger sandwich at ball park in summer  Snk ( PM): none D (6 PM): pork tenderloin with coucous and peas or chuck roast with buttered noodles and fried apples or spaghetti with meatballs and salad or filet with potatoes and broccoli  Snk ( PM): eats a lot of night - cheese and crackers while working, chips, bread  Beverages: 16-20 oz of black coffee, sweet tea not every day, or almond palmer with sweet and low, water  Usual physical activity: none recently   Estimated energy needs: 1600 calories 180 g carbohydrates 120 g protein 44 g fat  Progress Towards Goal(s):  In progress.   Nutritional Diagnosis:  [place]-3.3 Overweight/obesity As related to hx of meal skipping and large portion sizes.  As evidenced by BMI of 45.3.    Intervention:  Nutrition counseling provided Plan: Aim to eat 3 x day with carbs and protein.  Have no more than 7 egg yolks per week. For breakfast tried 1-2 boiled egg with fruit or protein shake. Make enough food enough from dinner to bring to lunch. Aim to fill half of your plate with vegetables. Try using frozen vegetables or pre-washed vegetable to add to your plate. Keep protein 3-4 oz (quarter of your plate). Have starch/carbs (about 1 cup portion).  Try to not work and eat at the same time if possible. Portion out  snacks. Try non fat greek yogurt with oats and fruit for breakfast or snack.  Teaching Method Utilized:  Visual Auditory Hands on  Handouts given during visit include:  MyPlate Handout  Meal card  15 g CHO Snacks  Barriers to learning/adherence to lifestyle change: stress, caretaker  Demonstrated degree of understanding via:  Teach Back   Monitoring/Evaluation:  Dietary intake, exercise, and body weight in 1 month(s).

## 2016-04-24 NOTE — Patient Instructions (Addendum)
Aim to eat 3 x day with carbs and protein.  Have no more than 7 egg yolks per week. For breakfast tried 1-2 boiled egg with fruit or protein shake. Make enough food enough from dinner to bring to lunch. Aim to fill half of your plate with vegetables. Try using frozen vegetables or pre-washed vegetable to add to your plate. Keep protein 3-4 oz (quarter of your plate). Have starch/carbs (about 1 cup portion).  Try to not work and eat at the same time if possible. Portion out snacks. Try non fat greek yogurt with oats and fruit for breakfast or snack.

## 2016-04-25 HISTORY — PX: FOOT HARDWARE REMOVAL: SHX1661

## 2016-04-28 NOTE — Progress Notes (Signed)
Referring: Dr Sharlet [facility]  HPI: 60 yo female for evaluation of hyperlipidemia. Echo 2004 with normal LV function; mild LAEETT 11/10 negative. TSH 8/16 2.62. Labs 6/17 showed total chol 265, LDL 192, HDL 38 and triglycerides 267. Pt with h/o intolerance to lipitor. She has dyspnea on exertion chronically. She occasionally feels a chest heaviness with exertion as well. There is no orthopnea, PND, pedal edema, palpitations or syncope. We were asked to evaluate for hyperlipidemia.   Current Outpatient Prescriptions  Medication Sig Dispense Refill  . albuterol (PROVENTIL HFA;VENTOLIN HFA) 108 (90 BASE) MCG/ACT inhaler Inhale 2 puffs into the lungs every 4 (four) hours as needed for wheezing or shortness of breath. 1 Inhaler 1  . OVER THE COUNTER MEDICATION Juice plus chewable daily    . Vitamin D, Ergocalciferol, (DRISDOL) 50000 units CAPS capsule Take 50,000 Units by mouth every 7 (seven) days.     No current facility-administered medications for this visit.     Allergies  Allergen Reactions  . Doxycycline Diarrhea and Nausea Only    Nausea and diarrhea started a few hours after taking medication.  Diarrhea and nausea continue 2 days after discontinuation.  Marland Kitchen Penicillins Shortness Of Breath    REACTION: TONGUE SWELLING  . Promethazine Hcl Shortness Of Breath  . Clarithromycin Nausea And Vomiting  . Levofloxacin Nausea And Vomiting  . Statins Other (See Comments)    LEG CRAMPS     Past Medical History:  Diagnosis Date  . Allergy   . Arthritis    neck, knees, ankles, feet, back  . Asthma    prn inhaler  . Borderline diabetes mellitus   . Dental crowns present   . Hallux valgus of right foot 05/2014  . History of TIA (transient ischemic attack) 2004  . Hyperlipidemia   . Hypertension   . Limited joint range of motion    neck - states has bone spurs C2-5  . TMJ syndrome     Past Surgical History:  Procedure Laterality Date  . ACROMIONECTOMY Left 08/18/2008  . APPENDECTOMY   1979  . BUNIONECTOMY Left 07/17/2007  . BUNIONECTOMY Right 05/26/2014   Procedure: RIGHT FOOT LAPIDUS BUNION CORRECTION AMD MODIFIED MCBRIDE BUNIONECTOMY;  Surgeon: Wylene Simmer, MD;  Location: [facility];  Service: Orthopedics;  Laterality: Right;  . CESAREAN SECTION  1986  . correction of bunionectomy  2016   right foot   . GANGLION CYST EXCISION     wrist  . NASAL SEPTUM SURGERY  1981  . ROTATOR CUFF REPAIR W/ DISTAL CLAVICLE EXCISION Left 08/18/2008  . SHOULDER ARTHROSCOPY Left 06/07/2008  . thumb surgery  07-28-2015   left thumb knuckle collasped and had surgery   . TONSILLECTOMY  1964  . TUBAL LIGATION  1999    Social History   Social History  . Marital status: Married    Spouse name: N/A  . Number of children: 2  . Years of education: N/A   Occupational History  . Not on file.   Social History Main Topics  . Smoking status: Former Smoker    Quit date: 09/12/2005  . Smokeless tobacco: Never Used  . Alcohol use 0.0 oz/week     Comment: occasionally  . Drug use: No  . Sexual activity: Not on file   Other Topics Concern  . 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 Cancer Mother  lung  . Diabetes Mother   . Thalassemia Mother   . Colon polyps Mother   . Cancer Brother     lymphatic  . Heart disease Paternal Aunt   . Atrial fibrillation Father   . Thalassemia Sister   . Thalassemia Maternal Aunt   . Colon cancer Neg Hx   . Rectal cancer Neg Hx   . Stomach cancer Neg Hx     ROS: no fevers or chills, productive cough, hemoptysis, dysphasia, odynophagia, melena, hematochezia, dysuria, hematuria, rash, seizure activity, orthopnea, PND, pedal edema, claudication. Remaining systems are negative.  Physical Exam:   Blood pressure 124/76, pulse 96, height 5\' 2"  (1.575 m), weight 253 lb (114.8 kg).  General:  Well developed/obese in NAD Skin warm/dry Patient not depressed No  peripheral clubbing Back-normal HEENT-normal/normal eyelids Neck supple/normal carotid upstroke bilaterally; no bruits; no JVD; no thyromegaly chest - CTA/ normal expansion CV - RRR/normal S1 and S2; no murmurs, rubs or gallops;  PMI nondisplaced Abdomen -NT/ND, no HSM, no mass, + bowel sounds, no bruit 2+ femoral pulses, no bruits Ext-no edema, chords, 2+ DP Neuro-grossly nonfocal  ECG - sinus rhythm at a rate of 96. No ST changes.  A/P  1 hyperlipidemia-patient did not tolerate Lipitor. I will try Crestor 10 mg daily to see if she tolerates. Check lipids and liver in 4 weeks. I will also refer to lipid clinic for long-term follow-up. She will contact as if she develops myalgias.  2 chest pain-we will arrange an exercise treadmill for risk stratification.  3 Obesity-We discussed importance of exercise and weight loss  Kirk Ruths, MD

## 2016-04-30 ENCOUNTER — Ambulatory Visit (INDEPENDENT_AMBULATORY_CARE_PROVIDER_SITE_OTHER): Payer: Commercial Managed Care - HMO | Admitting: Cardiology

## 2016-04-30 ENCOUNTER — Encounter: Payer: Self-pay | Admitting: Cardiology

## 2016-04-30 VITALS — BP 124/76 | HR 96 | Ht 62.0 in | Wt 253.0 lb

## 2016-04-30 DIAGNOSIS — E785 Hyperlipidemia, unspecified: Secondary | ICD-10-CM | POA: Diagnosis not present

## 2016-04-30 DIAGNOSIS — R072 Precordial pain: Secondary | ICD-10-CM

## 2016-04-30 MED ORDER — ROSUVASTATIN CALCIUM 10 MG PO TABS: 10.0000 mg | ORAL_TABLET | Freq: Every day | ORAL | 12 refills | Status: DC

## 2016-04-30 NOTE — Patient Instructions (Signed)
Medication Instructions:   START ROSUVASTATIN 10 MG ONCE DAILY   Labwork:  Your physician recommends that you return for lab work in: IN 4 WEEKS = DO NOT EAT PRIOR TO LAB WORK  Testing/Procedures:  Your physician has requested that you have an exercise tolerance test. For further information please visit [email or web address]. Please also follow instruction sheet, as given. SCHEDULE IN 6 WEEKS    Follow-Up:  Your physician wants you to follow-up in: [place] will receive a reminder letter in the mail two months in advance. If you don't receive a letter, please call our office to schedule the follow-up appointment.   If you need a refill on your cardiac medications before your next appointment, please call your pharmacy.   REFERRAL TO ESTABLISH IN LIPID CLINIC IN 6 WEEKS

## 2016-05-30 ENCOUNTER — Encounter: Payer: Self-pay | Admitting: Family

## 2016-05-30 ENCOUNTER — Ambulatory Visit (INDEPENDENT_AMBULATORY_CARE_PROVIDER_SITE_OTHER): Payer: Commercial Managed Care - HMO | Admitting: Family

## 2016-05-30 VITALS — BP 118/88 | HR 78 | Temp 98.3°F | Wt 254.0 lb

## 2016-05-30 DIAGNOSIS — J0141 Acute recurrent pansinusitis: Secondary | ICD-10-CM

## 2016-05-30 MED ORDER — LEVOFLOXACIN 750 MG PO TABS: 750.0000 mg | ORAL_TABLET | Freq: Every day | ORAL | 0 refills | Status: DC

## 2016-05-30 MED ORDER — MONTELUKAST SODIUM 10 MG PO TABS: 10.0000 mg | ORAL_TABLET | Freq: Every day | ORAL | 3 refills | Status: DC

## 2016-05-30 NOTE — Assessment & Plan Note (Signed)
Symptoms and exam consistent with acute pansinusitis resistant to course of levofloxacin and azithromycin. Increase levofloxacin to 750 mg. Continue over-the-counter medications as needed for symptom relief and supportive care. Start montelukast for allergies. Referral to ear nose and throat placed if symptoms do not improve in the next 48-72 hours. Follow-up if symptoms worsen or fail to improve.

## 2016-05-30 NOTE — Patient Instructions (Signed)
Thank you for choosing [facility].  SUMMARY AND INSTRUCTIONS:  Medication:  Your prescription(s) have been submitted to your pharmacy or been printed and provided for you. Please take as directed and contact our office if you believe you are having problem(s) with the medication(s) or have any questions.   Follow up:  If your symptoms worsen or fail to improve, please contact our office for further instruction, or in case of emergency go directly to the emergency room at the closest medical facility.    General Recommendations:    Please drink plenty of fluids.  Get plenty of rest   Sleep in humidified air  Use saline nasal sprays  Netti pot   OTC Medications:  Decongestants - helps relieve congestion   Flonase (generic fluticasone) or Nasacort (generic triamcinolone) - please make sure to use the "cross-over" technique at a 45 degree angle towards the opposite eye as opposed to straight up the nasal passageway.   Sudafed (generic pseudoephedrine - Note this is the one that is available behind the pharmacy counter); Products with phenylephrine (-PE) may also be used but is often not as effective as pseudoephedrine.   If you have HIGH BLOOD PRESSURE - Coricidin HBP; AVOID any product that is -D as this contains pseudoephedrine which may increase your blood pressure.  Afrin (oxymetazoline) every 6-8 hours for up to 3 days.   Allergies - helps relieve runny nose, itchy eyes and sneezing   Claritin (generic loratidine), Allegra (fexofenidine), or Zyrtec (generic cyrterizine) for runny nose. These medications should not cause drowsiness.  Note - Benadryl (generic diphenhydramine) may be used however may cause drowsiness  Cough -   Delsym or Robitussin (generic dextromethorphan)  Expectorants - helps loosen mucus to ease removal   Mucinex (generic guaifenesin) as directed on the package.  Headaches / General Aches   Tylenol (generic acetaminophen) - DO  NOT EXCEED 3 grams (3,000 mg) in a 24 hour time period  Advil/Motrin (generic ibuprofen)   Sore Throat -   Salt water gargle   Chloraseptic (generic benzocaine) spray or lozenges / Sucrets (generic dyclonine)    Sinusitis Sinusitis is redness, soreness, and inflammation of the paranasal sinuses. Paranasal sinuses are air pockets within the bones of your face (beneath the eyes, the middle of the forehead, or above the eyes). In healthy paranasal sinuses, mucus is able to drain out, and air is able to circulate through them by way of your nose. However, when your paranasal sinuses are inflamed, mucus and air can become trapped. This can allow bacteria and other germs to grow and cause infection. Sinusitis can develop quickly and last only a short time (acute) or continue over a long period (chronic). Sinusitis that lasts for more than 12 weeks is considered chronic.  CAUSES  Causes of sinusitis include:  Allergies.  Structural abnormalities, such as displacement of the cartilage that separates your nostrils (deviated septum), which can decrease the air flow through your nose and sinuses and affect sinus drainage.  Functional abnormalities, such as when the small hairs (cilia) that line your sinuses and help remove mucus do not work properly or are not present. SIGNS AND SYMPTOMS  Symptoms of acute and chronic sinusitis are the same. The primary symptoms are pain and pressure around the affected sinuses. Other symptoms include:  Upper toothache.  Earache.  Headache.  Bad breath.  Decreased sense of smell and taste.  A cough, which worsens when you are lying flat.  Fatigue.  Fever.  Thick drainage   from your nose, which often is green and may contain pus (purulent).  Swelling and warmth over the affected sinuses. DIAGNOSIS  Your health care provider will perform a physical exam. During the exam, your health care provider may:  Look in your nose for signs of abnormal growths  in your nostrils (nasal polyps).  Tap over the affected sinus to check for signs of infection.  View the inside of your sinuses (endoscopy) using an imaging device that has a light attached (endoscope). If your health care provider suspects that you have chronic sinusitis, one or more of the following tests may be recommended:  Allergy tests.  Nasal culture. A sample of mucus is taken from your nose, sent to a lab, and screened for bacteria.  Nasal cytology. A sample of mucus is taken from your nose and examined by your health care provider to determine if your sinusitis is related to an allergy. TREATMENT  Most cases of acute sinusitis are related to a viral infection and will resolve on their own within 10 days. Sometimes medicines are prescribed to help relieve symptoms (pain medicine, decongestants, nasal steroid sprays, or saline sprays).  However, for sinusitis related to a bacterial infection, your health care provider will prescribe antibiotic medicines. These are medicines that will help kill the bacteria causing the infection.  Rarely, sinusitis is caused by a fungal infection. In theses cases, your health care provider will prescribe antifungal medicine. For some cases of chronic sinusitis, surgery is needed. Generally, these are cases in which sinusitis recurs more than 3 times per year, despite other treatments. HOME CARE INSTRUCTIONS   Drink plenty of water. Water helps thin the mucus so your sinuses can drain more easily.  Use a humidifier.  Inhale steam 3 to 4 times a day (for example, sit in the bathroom with the shower running).  Apply a warm, moist washcloth to your face 3 to 4 times a day, or as directed by your health care provider.  Use saline nasal sprays to help moisten and clean your sinuses.  Take medicines only as directed by your health care provider.  If you were prescribed either an antibiotic or antifungal medicine, finish it all even if you start to feel  better. SEEK IMMEDIATE MEDICAL CARE IF:  You have increasing pain or severe headaches.  You have nausea, vomiting, or drowsiness.  You have swelling around your face.  You have vision problems.  You have a stiff neck.  You have difficulty breathing. MAKE SURE YOU:   Understand these instructions.  Will watch your condition.  Will get help right away if you are not doing well or get worse. Document Released: 07/22/2005 Document Revised: 12/06/2013 Document Reviewed: 08/06/2011 ExitCare Patient Information 2015 [employer]. This information is not intended to replace advice given to you by your health care provider. Make sure you discuss any questions you have with your health care provider.   

## 2016-05-30 NOTE — Progress Notes (Signed)
Subjective:    Patient ID: [name], female    DOB: 1956-04-10, 60 y.o.   MRN: [id number]  Chief Complaint  Patient presents with  . Sinusitis    HPI:  [name] is a 60 y.o. female who  has a past medical history of Allergy; Arthritis; Asthma; Borderline diabetes mellitus; Dental crowns present; Hallux valgus of right foot (05/2014); History of TIA (transient ischemic attack) (2004); Hyperlipidemia; Hypertension; Limited joint range of motion; and TMJ syndrome. and presents today for an acute office visit.  1.) Cough - This is a new problem. Associated coughing, drainage, sinus pressure, dental pain and headaches have been going on for about 2 months. Initially had a fever with a maximum of 101.4 with no recent fevers. Modifying factors include 2 courses of azithromycin and 1 course of levofloxacin and continues to experience symptoms. She also completed steroid injections which did not help very much. Continues OTC medications which have helped a little. Course of the symptoms have remained the same. Does have a productive cough with yellow sputum.   Allergies  Allergen Reactions  . Doxycycline Diarrhea and Nausea Only    Nausea and diarrhea started a few hours after taking medication.  Diarrhea and nausea continue 2 days after discontinuation.  Marland Kitchen Penicillins Shortness Of Breath    REACTION: TONGUE SWELLING  . Promethazine Hcl Shortness Of Breath  . Clarithromycin Nausea And Vomiting  . Statins Other (See Comments)    LEG CRAMPS      Outpatient Medications Prior to Visit  Medication Sig Dispense Refill  . albuterol (PROVENTIL HFA;VENTOLIN HFA) 108 (90 BASE) MCG/ACT inhaler Inhale 2 puffs into the lungs every 4 (four) hours as needed for wheezing or shortness of breath. 1 Inhaler 1  . OVER THE COUNTER MEDICATION Juice plus chewable daily    . Vitamin D, Ergocalciferol, (DRISDOL) 50000 units CAPS capsule Take 50,000 Units by mouth every 7 (seven) days.    .  rosuvastatin (CRESTOR) 10 MG tablet Take 1 tablet (10 mg total) by mouth daily. (Patient not taking: Reported on 05/30/2016) 30 tablet 12   No facility-administered medications prior to visit.      Review of Systems  Constitutional: Negative for chills and fever.  HENT: Positive for congestion and sinus pressure.   Respiratory: Positive for cough.   Neurological: Positive for headaches.      Objective:    BP 118/88   Pulse 78   Temp 98.3 F (36.8 C)   Wt 254 lb (115.2 kg)   SpO2 95%   BMI 46.46 kg/m  Nursing note and vital signs reviewed.  Physical Exam  Constitutional: She is oriented to person, place, and time. She appears well-developed and well-nourished.  HENT:  Right Ear: Hearing, tympanic membrane, external ear and ear canal normal.  Left Ear: Hearing, tympanic membrane, external ear and ear canal normal.  Nose: Right sinus exhibits maxillary sinus tenderness and frontal sinus tenderness. Left sinus exhibits maxillary sinus tenderness and frontal sinus tenderness.  Mouth/Throat: Uvula is midline, oropharynx is clear and moist and mucous membranes are normal.  Neck: Neck supple.  Cardiovascular: Normal rate, regular rhythm, normal heart sounds and intact distal pulses.   Pulmonary/Chest: Effort normal and breath sounds normal.  Neurological: She is alert and oriented to person, place, and time.  Skin: Skin is warm and dry.       Assessment & Plan:   Problem List Items Addressed This Visit      Respiratory   Acute recurrent  pansinusitis - Primary    Symptoms and exam consistent with acute pansinusitis resistant to course of levofloxacin and azithromycin. Increase levofloxacin to 750 mg. Continue over-the-counter medications as needed for symptom relief and supportive care. Start montelukast for allergies. Referral to ear nose and throat placed if symptoms do not improve in the next 48-72 hours. Follow-up if symptoms worsen or fail to improve.      Relevant  Medications   levofloxacin (LEVAQUIN) 750 MG tablet   montelukast (SINGULAIR) 10 MG tablet   Other Relevant Orders   Ambulatory referral to ENT    Other Visit Diagnoses   None.      I am having Ms. Sheets start on levofloxacin and montelukast. I am also having her maintain her albuterol, OVER THE COUNTER MEDICATION, Vitamin D (Ergocalciferol), and rosuvastatin.   Meds ordered this encounter  Medications  . levofloxacin (LEVAQUIN) 750 MG tablet    Sig: Take 1 tablet (750 mg total) by mouth daily.    Dispense:  7 tablet    Refill:  0    Order Specific Question:   Supervising Provider    Answer:   Pricilla Holm A [id number]  . montelukast (SINGULAIR) 10 MG tablet    Sig: Take 1 tablet (10 mg total) by mouth at bedtime.    Dispense:  30 tablet    Refill:  3    Order Specific Question:   Supervising Provider    Answer:   Pricilla Holm A [id number]     Follow-up: Return if symptoms worsen or fail to improve.  Mauricio Po, FNP

## 2016-06-03 ENCOUNTER — Ambulatory Visit: Payer: Commercial Managed Care - HMO | Admitting: Dietician

## 2016-06-11 ENCOUNTER — Other Ambulatory Visit: Payer: Self-pay | Admitting: *Deleted

## 2016-06-11 ENCOUNTER — Encounter (HOSPITAL_COMMUNITY): Payer: Commercial Managed Care - HMO

## 2016-06-11 DIAGNOSIS — R072 Precordial pain: Secondary | ICD-10-CM

## 2016-06-13 ENCOUNTER — Ambulatory Visit (INDEPENDENT_AMBULATORY_CARE_PROVIDER_SITE_OTHER): Payer: Commercial Managed Care - HMO | Admitting: Pharmacist

## 2016-06-13 ENCOUNTER — Telehealth (HOSPITAL_COMMUNITY): Payer: Self-pay

## 2016-06-13 ENCOUNTER — Inpatient Hospital Stay (HOSPITAL_COMMUNITY): Admission: RE | Admit: 2016-06-13 | Payer: Commercial Managed Care - HMO | Source: Ambulatory Visit

## 2016-06-13 DIAGNOSIS — E785 Hyperlipidemia, unspecified: Secondary | ICD-10-CM | POA: Diagnosis not present

## 2016-06-13 NOTE — Patient Instructions (Addendum)
We will start the paperwork for Repatha pending results.  Please call the office with any issues. [phone].   Cholesterol Cholesterol is a white, waxy, fat-like substance needed by your body in small amounts. The liver makes all the cholesterol you need. Cholesterol is carried from the liver by the blood through the blood vessels. Deposits of cholesterol (plaque) may build up on blood vessel walls. These make the arteries narrower and stiffer. Cholesterol plaques increase the risk for heart attack and stroke.  You cannot feel your cholesterol level even if it is very high. The only way to know it is high is with a blood test. Once you know your cholesterol levels, you should keep a record of the test results. Work with your health care provider to keep your levels in the desired range.  WHAT DO THE RESULTS MEAN?  Total cholesterol is a rough measure of all the cholesterol in your blood.   LDL is the so-called bad cholesterol. This is the type that deposits cholesterol in the walls of the arteries. You want this level to be low.   HDL is the good cholesterol because it cleans the arteries and carries the LDL away. You want this level to be high.  Triglycerides are fat that the body can either burn for energy or store. High levels are closely linked to heart disease.  WHAT ARE THE DESIRED LEVELS OF CHOLESTEROL?  Total cholesterol below 200.   LDL below 100 for people at risk, below 70 for those at very high risk.   HDL above 50 is good, above 60 is best.   Triglycerides below 150.  HOW CAN I LOWER MY CHOLESTEROL?  Diet. Follow your diet programs as directed by your health care provider.   Choose fish or white meat chicken and [place], roasted or baked. Limit fatty cuts of red meat, fried foods, and processed meats, such as sausage and lunch meats.   Eat lots of fresh fruits and vegetables.  Choose whole grains, beans, pasta, potatoes, and cereals.   Use only small amounts  of olive, corn, or canola oils.   Avoid butter, mayonnaise, shortening, or palm kernel oils.  Avoid foods with trans fats.   Drink skim or nonfat milk and eat low-fat or nonfat yogurt and cheeses. Avoid whole milk, cream, ice cream, egg yolks, and full-fat cheeses.   Healthy desserts include angel food cake, ginger snaps, animal crackers, hard candy, popsicles, and low-fat or nonfat frozen yogurt. Avoid pastries, cakes, pies, and cookies.   Exercise. Follow your exercise programs as directed by your health care provider.   A regular program helps decrease LDL and raise HDL.   A regular program helps with weight control.   Do things that increase your activity level like gardening, walking, or taking the stairs. Ask your health care provider about how you can be more active in your daily life.   Medicine. Take medicine only as directed by your health care provider.   Medicine may be prescribed by your health care provider to help lower cholesterol and decrease the risk for heart disease.   If you have several risk factors, you may need medicine even if your levels are normal.   This information is not intended to replace advice given to you by your health care provider. Make sure you discuss any questions you have with your health care provider.   Document Released: 04/16/2001 Document Revised: 08/12/2014 Document Reviewed: 05/05/2013 Elsevier Interactive Patient Education [employer].

## 2016-06-13 NOTE — Progress Notes (Signed)
Patient ID: [name]                 DOB: 14-Sep-1955                    MRN: [id number]     HPI: Xander Semaan is a 60 y.o. female patient of Dr. [name] with PMH below that presents today for lipid evaluation. She was started on Crestor 10mg  daily at her last visit.   She reports today that she does not take medications except for seasonally for sinus infections/allergies. She states that she does not like medications unless she knows for sure she needs them. She reports being scared after her TIA so she took some medications then. She has a stress test coming up and she is certain this will reveal negative results for her heart; thus she is wanting to start pursuing the appropriate treatment.   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   PMH: htn, asthma, hyperlipidemia, Hx of TIA  Risk Factors: HTN LDL Goal: <70  Current Medications:  Crestor 10mg  daily - she states she took this for about 2 weeks before she developed "charlie horse"-like symptoms.   Intolerances:  Lipitor - she reports leg cramps and pains so severe she was unable to sleep at night. She is unsure of the dose but she recalls being on it for at least one month the same year as her TIA   Pravachol - similar symptoms to the above. She does not recall duration for this medication.   Zocor - similar to with Crestor. She also does not recall duration for this medication.   Diet: She cooks for her father. She never eats breakfast or lunch. Then states she eats everything but the fridge when she gets home for dinner. She craves pasta and carbohydrates.   Exercise: She has had several foot surgeries and her knees are in need of replacement so she is limited due to pain.   Family History: Her mother had cholesterol that was "off the chart." She was on lipitor. Her father is still a patient with Dr. [name]. He has afib, htn and high cholesterol.   Social History: She quit smoking in 2002 after her mother  passed away from lung cancer. She denies alcohol.   Labs:  01/2016 TC 265  TG 267  HDL 38  nonHDL 226  LDL-D 192  Past Medical History:  Diagnosis Date  . Allergy   . Arthritis    neck, knees, ankles, feet, back  . Asthma    prn inhaler  . Borderline diabetes mellitus   . Dental crowns present   . Hallux valgus of right foot 05/2014  . History of TIA (transient ischemic attack) 2004  . Hyperlipidemia   . Hypertension   . Limited joint range of motion    neck - states has bone spurs C2-5  . TMJ syndrome     Current Outpatient Prescriptions on File Prior to Visit  Medication Sig Dispense Refill  . albuterol (PROVENTIL HFA;VENTOLIN HFA) 108 (90 BASE) MCG/ACT inhaler Inhale 2 puffs into the lungs every 4 (four) hours as needed for wheezing or shortness of breath. 1 Inhaler 1  . levofloxacin (LEVAQUIN) 750 MG tablet Take 1 tablet (750 mg total) by mouth daily. 7 tablet 0  . montelukast (SINGULAIR) 10 MG tablet Take 1 tablet (10 mg total) by mouth at bedtime. 30 tablet 3  . OVER THE COUNTER MEDICATION Juice plus chewable daily    .  rosuvastatin (CRESTOR) 10 MG tablet Take 1 tablet (10 mg total) by mouth daily. (Patient not taking: Reported on 05/30/2016) 30 tablet 12  . Vitamin D, Ergocalciferol, (DRISDOL) 50000 units CAPS capsule Take 50,000 Units by mouth every 7 (seven) days.     No current facility-administered medications on file prior to visit.     Allergies  Allergen Reactions  . Doxycycline Diarrhea and Nausea Only    Nausea and diarrhea started a few hours after taking medication.  Diarrhea and nausea continue 2 days after discontinuation.  Marland Kitchen Penicillins Shortness Of Breath    REACTION: TONGUE SWELLING  . Promethazine Hcl Shortness Of Breath  . Clarithromycin Nausea And Vomiting  . Statins Other (See Comments)    LEG CRAMPS    Assessment/Plan: Hyperlipidemia: Repeat labs today since she has only had black coffee this morning. If LDL remains as elevated as previous  will pursue Repatha. She is statin intolerant and unwilling to rechallenge with statin medications. Discussed diet modifications to help with TG and overall health/weight. We also discussed adding fish oil for TG and she declined at this time.   Thank you,  Lelan Pons. Patterson Hammersmith, [facility]  06/13/2016 1:00 PM

## 2016-06-13 NOTE — Telephone Encounter (Signed)
Encounter complete. 

## 2016-06-14 LAB — LIPID PANEL
CHOL/HDL RATIO: 6.4 ratio — AB (ref <5.0)
CHOLESTEROL: 262 mg/dL — AB (ref <200)
HDL: 41 mg/dL — ABNORMAL LOW (ref >50)
LDL Cholesterol: 174 mg/dL — ABNORMAL HIGH
TRIGLYCERIDES: 234 mg/dL — AB (ref <150)
VLDL: 47 mg/dL — AB (ref <30)

## 2016-06-14 LAB — HEPATIC FUNCTION PANEL
ALT: 24 U/L (ref 6–29)
AST: 18 U/L (ref 10–35)
Albumin: 4.4 g/dL (ref 3.6–5.1)
Alkaline Phosphatase: 83 U/L (ref 33–130)
BILIRUBIN DIRECT: 0.1 mg/dL (ref <=0.2)
Indirect Bilirubin: 0.4 mg/dL (ref 0.2–1.2)
TOTAL PROTEIN: 7.2 g/dL (ref 6.1–8.1)
Total Bilirubin: 0.5 mg/dL (ref 0.2–1.2)

## 2016-06-18 ENCOUNTER — Ambulatory Visit (HOSPITAL_COMMUNITY)
Admission: RE | Admit: 2016-06-18 | Discharge: 2016-06-18 | Disposition: A | Discharge status: Home / Self Care | Payer: Commercial Managed Care - HMO | Source: Ambulatory Visit | Attending: Cardiovascular Disease | Admitting: Cardiovascular Disease

## 2016-06-18 DIAGNOSIS — R0609 Other forms of dyspnea: Secondary | ICD-10-CM | POA: Diagnosis not present

## 2016-06-18 DIAGNOSIS — Z6841 Body Mass Index (BMI) 40.0 and over, adult: Secondary | ICD-10-CM | POA: Diagnosis not present

## 2016-06-18 DIAGNOSIS — R9439 Abnormal result of other cardiovascular function study: Secondary | ICD-10-CM | POA: Insufficient documentation

## 2016-06-18 DIAGNOSIS — E119 Type 2 diabetes mellitus without complications: Secondary | ICD-10-CM | POA: Insufficient documentation

## 2016-06-18 DIAGNOSIS — R002 Palpitations: Secondary | ICD-10-CM | POA: Diagnosis not present

## 2016-06-18 DIAGNOSIS — Z8249 Family history of ischemic heart disease and other diseases of the circulatory system: Secondary | ICD-10-CM | POA: Insufficient documentation

## 2016-06-18 DIAGNOSIS — E669 Obesity, unspecified: Secondary | ICD-10-CM | POA: Diagnosis not present

## 2016-06-18 DIAGNOSIS — R072 Precordial pain: Secondary | ICD-10-CM | POA: Insufficient documentation

## 2016-06-18 DIAGNOSIS — Z87891 Personal history of nicotine dependence: Secondary | ICD-10-CM | POA: Insufficient documentation

## 2016-06-18 DIAGNOSIS — Z8673 Personal history of transient ischemic attack (TIA), and cerebral infarction without residual deficits: Secondary | ICD-10-CM | POA: Insufficient documentation

## 2016-06-18 DIAGNOSIS — I10 Essential (primary) hypertension: Secondary | ICD-10-CM | POA: Insufficient documentation

## 2016-06-18 MED ORDER — AMINOPHYLLINE 25 MG/ML IV SOLN
75.0000 mg | Freq: Once | INTRAVENOUS | Status: AC
  Administered 2016-06-18: 75 mg via INTRAVENOUS

## 2016-06-18 MED ORDER — TECHNETIUM TC 99M TETROFOSMIN IV KIT
30.1000 | PACK | Freq: Once | INTRAVENOUS | Status: AC | PRN
  Administered 2016-06-18: 30.1 via INTRAVENOUS
  Filled 2016-06-18: qty 31

## 2016-06-18 MED ORDER — REGADENOSON 0.4 MG/5ML IV SOLN
0.4000 mg | Freq: Once | INTRAVENOUS | Status: AC
  Administered 2016-06-18: 0.4 mg via INTRAVENOUS

## 2016-06-19 ENCOUNTER — Ambulatory Visit (HOSPITAL_COMMUNITY)
Admission: RE | Admit: 2016-06-19 | Discharge: 2016-06-19 | Disposition: A | Discharge status: Home / Self Care | Payer: Commercial Managed Care - HMO | Source: Ambulatory Visit | Attending: Cardiology | Admitting: Cardiology

## 2016-06-19 LAB — MYOCARDIAL PERFUSION IMAGING
CHL CUP NUCLEAR SRS: 1
CHL CUP NUCLEAR SSS: 3
CSEPPHR: 118 {beats}/min
LV dias vol: 82 mL (ref 46–106)
LV sys vol: 30 mL
Rest HR: 91 {beats}/min
SDS: 2
TID: 0.86

## 2016-06-19 MED ORDER — TECHNETIUM TC 99M TETROFOSMIN IV KIT
29.5000 | PACK | Freq: Once | INTRAVENOUS | Status: AC | PRN
  Administered 2016-06-19: 29.5 via INTRAVENOUS

## 2016-06-20 ENCOUNTER — Telehealth: Payer: Self-pay | Admitting: Pharmacist

## 2016-06-20 NOTE — Telephone Encounter (Signed)
LMTCB about approval of Repatha  Will send to specialty pharmacy of choice.

## 2016-06-24 MED ORDER — EVOLOCUMAB 140 MG/ML ~~LOC~~ SOAJ: 140.0000 mg | SUBCUTANEOUS | 5 refills | Status: DC

## 2016-06-24 NOTE — Telephone Encounter (Signed)
Spoke to pt - Repatha approved. Would like from [facility] [employer]. RX sent She has appt with Dr. [name] on Wednesday in [facility]. She will confirm with him that she should be on medication since she may need procedure. Informed her occasionally providers wait until after procedure to start, but ok to pick up and leave in fridge until first dose.  Have left copay card at front desk. Pt knows to call after first dose for schedule f/u labs. Pt states understanding and appreciation.

## 2016-06-26 ENCOUNTER — Encounter: Payer: Self-pay | Admitting: Cardiology

## 2016-06-26 ENCOUNTER — Other Ambulatory Visit (INDEPENDENT_AMBULATORY_CARE_PROVIDER_SITE_OTHER): Payer: Commercial Managed Care - HMO

## 2016-06-26 ENCOUNTER — Encounter: Payer: Self-pay | Admitting: *Deleted

## 2016-06-26 ENCOUNTER — Other Ambulatory Visit: Payer: Self-pay | Admitting: *Deleted

## 2016-06-26 ENCOUNTER — Ambulatory Visit (INDEPENDENT_AMBULATORY_CARE_PROVIDER_SITE_OTHER): Payer: Commercial Managed Care - HMO | Admitting: Cardiology

## 2016-06-26 VITALS — BP 175/110 | HR 92 | Ht 62.0 in | Wt 255.1 lb

## 2016-06-26 DIAGNOSIS — R9439 Abnormal result of other cardiovascular function study: Secondary | ICD-10-CM

## 2016-06-26 DIAGNOSIS — R072 Precordial pain: Secondary | ICD-10-CM

## 2016-06-26 DIAGNOSIS — E7800 Pure hypercholesterolemia, unspecified: Secondary | ICD-10-CM

## 2016-06-26 LAB — CBC
HCT: 42.4 % (ref 35.0–45.0)
HEMOGLOBIN: 14.1 g/dL (ref 11.7–15.5)
MCH: 29.1 pg (ref 27.0–33.0)
MCHC: 33.3 g/dL (ref 32.0–36.0)
MCV: 87.4 fL (ref 80.0–100.0)
MPV: 10.7 fL (ref 7.5–12.5)
Platelets: 237 10*3/uL (ref 140–400)
RBC: 4.85 MIL/uL (ref 3.80–5.10)
RDW: 13.8 % (ref 11.0–15.0)
WBC: 6.2 10*3/uL (ref 3.8–10.8)

## 2016-06-26 LAB — BASIC METABOLIC PANEL
BUN: 19 mg/dL (ref 7–25)
CALCIUM: 9.6 mg/dL (ref 8.6–10.4)
CO2: 26 mmol/L (ref 20–31)
Chloride: 103 mmol/L (ref 98–110)
Creat: 0.86 mg/dL (ref 0.50–1.05)
GLUCOSE: 99 mg/dL (ref 65–99)
Potassium: 3.9 mmol/L (ref 3.5–5.3)
SODIUM: 137 mmol/L (ref 135–146)

## 2016-06-26 NOTE — Progress Notes (Signed)
HPI: FU hyperlipidemia and abnormal nuclear study. Echo 2004 with normal LV function; mild LAE. ETT 11/10 negative. Nuclear study September 2017 showed ejection fraction 63%. There was a small defect suggestive of ischemia in the apical lateral wall. TSH 8/16 2.62. Labs 6/17 showed total chol 265, LDL 192, HDL 38 and triglycerides 267. Pt with h/o intolerance to lipitor. Patient started on Crestor at last office visit but did not tolerate and referred to lipid clinic. Since last seen, she does have dyspnea on exertion unchanged. No orthopnea or PND. She has intermittent chest pain. She can feel some aching with exertion but also states she has indigestion at night.   Current Outpatient Prescriptions  Medication Sig Dispense Refill  . albuterol (PROVENTIL HFA;VENTOLIN HFA) 108 (90 BASE) MCG/ACT inhaler Inhale 2 puffs into the lungs every 4 (four) hours as needed for wheezing or shortness of breath. 1 Inhaler 1  . OVER THE COUNTER MEDICATION Juice plus chewable daily     No current facility-administered medications for this visit.      Past Medical History:  Diagnosis Date  . Allergy   . Arthritis    neck, knees, ankles, feet, back  . Asthma    prn inhaler  . Borderline diabetes mellitus   . Dental crowns present   . Hallux valgus of right foot 05/2014  . History of TIA (transient ischemic attack) 2004  . Hyperlipidemia   . Hypertension   . Limited joint range of motion    neck - states has bone spurs C2-5  . TMJ syndrome     Past Surgical History:  Procedure Laterality Date  . ACROMIONECTOMY Left 08/18/2008  . APPENDECTOMY  1979  . BUNIONECTOMY Left 07/17/2007  . BUNIONECTOMY Right 05/26/2014   Procedure: RIGHT FOOT LAPIDUS BUNION CORRECTION AMD MODIFIED MCBRIDE BUNIONECTOMY;  Surgeon: Wylene Simmer, MD;  Location: [facility];  Service: Orthopedics;  Laterality: Right;  . CESAREAN SECTION  1986  . correction of bunionectomy  2016   right foot   . GANGLION  CYST EXCISION     wrist  . NASAL SEPTUM SURGERY  1981  . ROTATOR CUFF REPAIR W/ DISTAL CLAVICLE EXCISION Left 08/18/2008  . SHOULDER ARTHROSCOPY Left 06/07/2008  . thumb surgery  07-28-2015   left thumb knuckle collasped and had surgery   . TONSILLECTOMY  1964  . TUBAL LIGATION  1999    Social History   Social History  . Marital status: Married    Spouse name: N/A  . Number of children: 2  . Years of education: N/A   Occupational History  . Not on file.   Social History Main Topics  . Smoking status: Former Smoker    Quit date: 09/12/2005  . Smokeless tobacco: Never Used  . Alcohol use 0.0 oz/week     Comment: occasionally  . Drug use: No  . Sexual activity: Not on file   Other Topics Concern  . 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 Cancer Mother     lung  . Diabetes Mother   . Thalassemia Mother   . Colon polyps Mother   . Cancer Brother     lymphatic  . Heart disease Paternal Aunt   . Atrial fibrillation Father   . Thalassemia Sister   . Thalassemia Maternal Aunt   . Colon cancer Neg Hx   . Rectal cancer  Neg Hx   . Stomach cancer Neg Hx     ROS: Arthralgias but no fevers or chills, productive cough, hemoptysis, dysphasia, odynophagia, melena, hematochezia, dysuria, hematuria, rash, seizure activity, orthopnea, PND, pedal edema, claudication. Remaining systems are negative.  Physical Exam: Well-developed morbidly obese  in no acute distress.  Skin is warm and dry.  HEENT is normal.  Neck is supple.  Chest is clear to auscultation with normal expansion.  Cardiovascular exam is regular rate and rhythm.  Abdominal exam nontender or distended. No masses palpated. Extremities show no edema. neuro grossly intact  A/P  1 chest pain-symptoms are atypical. However her nuclear study is mildly abnormal. I explained that it is a low risk study and could be caused by shifting breast attenuation.  However she has continuing symptoms and is extremely concerned about these. She would like definitive evaluation. I will arrange a cardiac catheterization. The risks and benefits including myocardial infarction, CVA and death discussed and she agrees to proceed. Add aspirin daily.  2 hyperlipidemia-she did not tolerate Crestor. She has not tolerated Lipitor previously. She has been reviewed by lipid clinic and has been approved for repatha.  3 elevated blood pressure-she is extremely anxious in the office today and states that her blood pressure is typically 130/80. Follow and treat as needed.    Kirk Ruths, MD

## 2016-06-26 NOTE — Patient Instructions (Addendum)
Your physician has requested that you have a cardiac catheterization. Cardiac catheterization is used to diagnose and/or treat various heart conditions. Doctors may recommend this procedure for a number of different reasons. The most common reason is to evaluate chest pain. Chest pain can be a symptom of coronary artery disease (CAD), and cardiac catheterization can show whether plaque is narrowing or blocking your heart's arteries. This procedure is also used to evaluate the valves, as well as measure the blood flow and oxygen levels in different parts of your heart. For further information please visit www.cardiosmart.org. Please follow instruction sheet, as given.   Your physician recommends that you schedule a follow-up appointment in: 6-8 WEEKS WITH DR CRENSHAW   Coronary Angiogram A coronary angiogram is an X-ray procedure that is used to examine the arteries in the heart. In this procedure, a dye (contrast dye) is injected through a long, thin tube (catheter). The catheter is inserted through the groin, wrist, or arm. The dye is injected into each artery, then X-rays are taken to show if there is a blockage in the arteries of the heart. This procedure can also show if you have valve disease or a disease of the aorta, and it can be used to check the overall function of your heart muscle. You may have a coronary angiogram if:  You are having chest pain, or other symptoms of angina, and you are at risk for heart disease.  You have an abnormal electrocardiogram (ECG) or stress test.  You have chest pain and heart failure.  You are having irregular heart rhythms.  You and your health care provider determine that the benefits of the test information outweigh the risks of the procedure. Let your health care provider know about:  Any allergies you have, including allergies to contrast dye.  All medicines you are taking, including vitamins, herbs, eye drops, creams, and over-the-counter  medicines.  Any problems you or family members have had with anesthetic medicines.  Any blood disorders you have.  Any surgeries you have had.  History of kidney problems or kidney failure.  Any medical conditions you have.  Whether you are pregnant or may be pregnant. What are the risks? Generally, this is a safe procedure. However, problems may occur, including:  Infection.  Allergic reaction to medicines or dyes that are used.  Bleeding from the access site or other locations.  Kidney injury, especially in people with impaired kidney function.  Stroke (rare).  Heart attack (rare).  Damage to other structures or organs. What happens before the procedure? Staying hydrated  Follow instructions from your health care provider about hydration, which may include:  Up to 2 hours before the procedure - you may continue to drink clear liquids, such as water, clear fruit juice, black coffee, and plain tea. Eating and drinking restrictions  Follow instructions from your health care provider about eating and drinking, which may include:  8 hours before the procedure - stop eating heavy meals or foods such as meat, fried foods, or fatty foods.  6 hours before the procedure - stop eating light meals or foods, such as toast or cereal.  2 hours before the procedure - stop drinking clear liquids. General instructions  Ask your health care provider about:  Changing or stopping your regular medicines. This is especially important if you are taking diabetes medicines or blood thinners.  Taking medicines such as ibuprofen. These medicines can thin your blood. Do not take these medicines before your procedure if your health   care provider instructs you not to, though aspirin may be recommended prior to coronary angiograms.  Plan to have someone take you home from the hospital or clinic.  You may need to have blood tests or X-rays done. What happens during the procedure?  An IV tube  will be inserted into one of your veins.  You will be given one or more of the following:  A medicine to help you relax (sedative).  A medicine to numb the area where the catheter will be inserted into an artery (local anesthetic).  To reduce your risk of infection:  Your health care team will wash or sanitize their hands.  Your skin will be washed with soap.  Hair may be removed from the area where the catheter will be inserted.  You will be connected to a continuous ECG monitor.  The catheter will be inserted into an artery. The location may be in your groin, in your wrist, or in the fold of your arm (near your elbow).  A type of X-ray (fluoroscopy) will be used to help guide the catheter to the opening of the blood vessel that is being examined.  A dye will be injected into the catheter, and X-rays will be taken. The dye will help to show where any narrowing or blockages are located in the heart arteries.  Tell your health care provider if you have any chest pain or trouble breathing during the procedure.  If blockages are found, your health care provider may perform another procedure, such as inserting a coronary stent. The procedure may vary among health care providers and hospitals. What happens after the procedure?  After the procedure, you will need to keep the area still for a few hours, or for as long as told by your health care provider. If the procedure is done through the groin, you will be instructed to not bend and not cross your legs.  The insertion site will be checked frequently.  The pulse in your foot or wrist will be checked frequently.  You may have additional blood tests, X-rays, and a test that records the electrical activity of your heart (ECG).  Do not drive for 24 hours if you were given a sedative. Summary  A coronary angiogram is an X-ray procedure that is used to look into the arteries in the heart.  During the procedure, a dye (contrast dye)  is injected through a long, thin tube (catheter). The catheter is inserted through the groin, wrist, or arm.  Tell your health care provider about any allergies you have, including allergies to contrast dye.  After the procedure, you will need to keep the area still for a few hours, or for as long as told by your health care provider. This information is not intended to replace advice given to you by your health care provider. Make sure you discuss any questions you have with your health care provider. Document Released: 01/26/2003 Document Revised: 05/03/2016 Document Reviewed: 05/03/2016 Elsevier Interactive Patient Education  2017 Elsevier Inc.   

## 2016-06-27 LAB — PROTIME-INR
INR: 1
PROTHROMBIN TIME: 11.1 s (ref 9.0–11.5)

## 2016-06-28 ENCOUNTER — Encounter (HOSPITAL_COMMUNITY): Payer: Self-pay | Admitting: *Deleted

## 2016-06-28 ENCOUNTER — Ambulatory Visit (HOSPITAL_COMMUNITY)
Admission: RE | Admit: 2016-06-28 | Discharge: 2016-06-28 | Disposition: A | Discharge status: Home / Self Care | Payer: Commercial Managed Care - HMO | Source: Ambulatory Visit | Attending: Cardiology | Admitting: Cardiology

## 2016-06-28 ENCOUNTER — Encounter (HOSPITAL_COMMUNITY)
Admission: RE | Disposition: A | Discharge status: Home / Self Care | Payer: Self-pay | Source: Ambulatory Visit | Attending: Cardiology

## 2016-06-28 DIAGNOSIS — J45909 Unspecified asthma, uncomplicated: Secondary | ICD-10-CM | POA: Insufficient documentation

## 2016-06-28 DIAGNOSIS — Z79899 Other long term (current) drug therapy: Secondary | ICD-10-CM | POA: Insufficient documentation

## 2016-06-28 DIAGNOSIS — Z6841 Body Mass Index (BMI) 40.0 and over, adult: Secondary | ICD-10-CM | POA: Diagnosis not present

## 2016-06-28 DIAGNOSIS — R9439 Abnormal result of other cardiovascular function study: Secondary | ICD-10-CM | POA: Diagnosis not present

## 2016-06-28 DIAGNOSIS — R7303 Prediabetes: Secondary | ICD-10-CM | POA: Diagnosis not present

## 2016-06-28 DIAGNOSIS — R0600 Dyspnea, unspecified: Secondary | ICD-10-CM | POA: Diagnosis not present

## 2016-06-28 DIAGNOSIS — Z8673 Personal history of transient ischemic attack (TIA), and cerebral infarction without residual deficits: Secondary | ICD-10-CM | POA: Insufficient documentation

## 2016-06-28 DIAGNOSIS — E669 Obesity, unspecified: Secondary | ICD-10-CM | POA: Diagnosis present

## 2016-06-28 DIAGNOSIS — R0789 Other chest pain: Secondary | ICD-10-CM | POA: Insufficient documentation

## 2016-06-28 DIAGNOSIS — E785 Hyperlipidemia, unspecified: Secondary | ICD-10-CM | POA: Diagnosis not present

## 2016-06-28 DIAGNOSIS — Z87891 Personal history of nicotine dependence: Secondary | ICD-10-CM | POA: Insufficient documentation

## 2016-06-28 DIAGNOSIS — I10 Essential (primary) hypertension: Secondary | ICD-10-CM | POA: Diagnosis not present

## 2016-06-28 HISTORY — PX: CARDIAC CATHETERIZATION: SHX172

## 2016-06-28 SURGERY — LEFT HEART CATH AND CORONARY ANGIOGRAPHY

## 2016-06-28 MED ORDER — SODIUM CHLORIDE 0.9 % IV SOLN: 250.0000 mL | INTRAVENOUS | Status: DC | PRN

## 2016-06-28 MED ORDER — SODIUM CHLORIDE 0.9% FLUSH: 3.0000 mL | Freq: Two times a day (BID) | INTRAVENOUS | Status: DC

## 2016-06-28 MED ORDER — IOPAMIDOL (ISOVUE-370) INJECTION 76%
INTRAVENOUS | Status: AC
  Filled 2016-06-28: qty 100

## 2016-06-28 MED ORDER — ASPIRIN 81 MG PO CHEW
81.0000 mg | CHEWABLE_TABLET | ORAL | Status: AC
  Administered 2016-06-28: 81 mg via ORAL

## 2016-06-28 MED ORDER — VERAPAMIL HCL 2.5 MG/ML IV SOLN
INTRAVENOUS | Status: DC | PRN
  Administered 2016-06-28: 10 mL via INTRA_ARTERIAL

## 2016-06-28 MED ORDER — HEPARIN SODIUM (PORCINE) 1000 UNIT/ML IJ SOLN
INTRAMUSCULAR | Status: AC
  Filled 2016-06-28: qty 1

## 2016-06-28 MED ORDER — SODIUM CHLORIDE 0.9% FLUSH: 3.0000 mL | INTRAVENOUS | Status: DC | PRN

## 2016-06-28 MED ORDER — ASPIRIN 81 MG PO CHEW
CHEWABLE_TABLET | ORAL | Status: AC
  Filled 2016-06-28: qty 1

## 2016-06-28 MED ORDER — FENTANYL CITRATE (PF) 100 MCG/2ML IJ SOLN
INTRAMUSCULAR | Status: AC
  Filled 2016-06-28: qty 2

## 2016-06-28 MED ORDER — SODIUM CHLORIDE 0.9 % WEIGHT BASED INFUSION
3.0000 mL/kg/h | INTRAVENOUS | Status: AC
Start: 2016-06-28 — End: 2016-06-28
  Administered 2016-06-28: 3 mL/kg/h via INTRAVENOUS

## 2016-06-28 MED ORDER — LIDOCAINE HCL (PF) 1 % IJ SOLN
INTRAMUSCULAR | Status: AC
  Filled 2016-06-28: qty 30

## 2016-06-28 MED ORDER — HEPARIN (PORCINE) IN NACL 2-0.9 UNIT/ML-% IJ SOLN
INTRAMUSCULAR | Status: DC | PRN
Start: 2016-06-28 — End: 2016-06-28
  Administered 2016-06-28: 1000 mL

## 2016-06-28 MED ORDER — SODIUM CHLORIDE 0.9 % WEIGHT BASED INFUSION: 1.0000 mL/kg/h | INTRAVENOUS | Status: DC

## 2016-06-28 MED ORDER — ACETAMINOPHEN 500 MG PO TABS
ORAL_TABLET | ORAL | Status: AC
  Administered 2016-06-28: 1000 mg
  Filled 2016-06-28: qty 2

## 2016-06-28 MED ORDER — HEPARIN (PORCINE) IN NACL 2-0.9 UNIT/ML-% IJ SOLN
INTRAMUSCULAR | Status: AC
  Filled 2016-06-28: qty 1000

## 2016-06-28 MED ORDER — FENTANYL CITRATE (PF) 100 MCG/2ML IJ SOLN
INTRAMUSCULAR | Status: DC | PRN
  Administered 2016-06-28: 25 ug via INTRAVENOUS

## 2016-06-28 MED ORDER — MIDAZOLAM HCL 2 MG/2ML IJ SOLN
INTRAMUSCULAR | Status: DC | PRN
  Administered 2016-06-28: 1 mg via INTRAVENOUS

## 2016-06-28 MED ORDER — LIDOCAINE HCL (PF) 1 % IJ SOLN
INTRAMUSCULAR | Status: DC | PRN
  Administered 2016-06-28: 2 mL

## 2016-06-28 MED ORDER — ACETAMINOPHEN 500 MG PO TABS: 1000.0000 mg | ORAL_TABLET | Freq: Four times a day (QID) | ORAL | Status: DC | PRN

## 2016-06-28 MED ORDER — VERAPAMIL HCL 2.5 MG/ML IV SOLN
INTRAVENOUS | Status: AC
  Filled 2016-06-28: qty 2

## 2016-06-28 MED ORDER — HEPARIN SODIUM (PORCINE) 1000 UNIT/ML IJ SOLN
INTRAMUSCULAR | Status: DC | PRN
  Administered 2016-06-28: 5500 [IU] via INTRAVENOUS

## 2016-06-28 MED ORDER — MIDAZOLAM HCL 2 MG/2ML IJ SOLN
INTRAMUSCULAR | Status: AC
  Filled 2016-06-28: qty 2

## 2016-06-28 MED ORDER — IOPAMIDOL (ISOVUE-370) INJECTION 76%
INTRAVENOUS | Status: DC | PRN
  Administered 2016-06-28: 50 mL via INTRAVENOUS

## 2016-06-28 SURGICAL SUPPLY — 12 items
CATH 5FR JL3.5 JR4 ANG PIG MP (CATHETERS) ×2 IMPLANT
CATH LAUNCHER 5F RADR (CATHETERS) ×1 IMPLANT
CATHETER LAUNCHER 5F RADR (CATHETERS) ×2
DEVICE RAD COMP TR BAND LRG (VASCULAR PRODUCTS) ×2 IMPLANT
GLIDESHEATH SLEND SS 6F .021 (SHEATH) ×2 IMPLANT
GUIDEWIRE INQWIRE 1.5J.035X260 (WIRE) ×1 IMPLANT
INQWIRE 1.5J .035X260CM (WIRE) ×2
KIT HEART LEFT (KITS) ×2 IMPLANT
PACK CARDIAC CATHETERIZATION (CUSTOM PROCEDURE TRAY) ×2 IMPLANT
SYR MEDRAD MARK V 150ML (SYRINGE) ×2 IMPLANT
TRANSDUCER W/STOPCOCK (MISCELLANEOUS) ×2 IMPLANT
TUBING CIL FLEX 10 FLL-RA (TUBING) ×2 IMPLANT

## 2016-06-28 NOTE — Discharge Instructions (Signed)

## 2016-06-28 NOTE — H&P (View-Only) (Signed)
HPI: FU hyperlipidemia and abnormal nuclear study. Echo 2004 with normal LV function; mild LAE. ETT 11/10 negative. Nuclear study September 2017 showed ejection fraction 63%. There was a small defect suggestive of ischemia in the apical lateral wall. TSH 8/16 2.62. Labs 6/17 showed total chol 265, LDL 192, HDL 38 and triglycerides 267. Pt with h/o intolerance to lipitor. Patient started on Crestor at last office visit but did not tolerate and referred to lipid clinic. Since last seen, she does have dyspnea on exertion unchanged. No orthopnea or PND. She has intermittent chest pain. She can feel some aching with exertion but also states she has indigestion at night.   Current Outpatient Prescriptions  Medication Sig Dispense Refill  . albuterol (PROVENTIL HFA;VENTOLIN HFA) 108 (90 BASE) MCG/ACT inhaler Inhale 2 puffs into the lungs every 4 (four) hours as needed for wheezing or shortness of breath. 1 Inhaler 1  . OVER THE COUNTER MEDICATION Juice plus chewable daily     No current facility-administered medications for this visit.      Past Medical History:  Diagnosis Date  . Allergy   . Arthritis    neck, knees, ankles, feet, back  . Asthma    prn inhaler  . Borderline diabetes mellitus   . Dental crowns present   . Hallux valgus of right foot 05/2014  . History of TIA (transient ischemic attack) 2004  . Hyperlipidemia   . Hypertension   . Limited joint range of motion    neck - states has bone spurs C2-5  . TMJ syndrome     Past Surgical History:  Procedure Laterality Date  . ACROMIONECTOMY Left 08/18/2008  . APPENDECTOMY  1979  . BUNIONECTOMY Left 07/17/2007  . BUNIONECTOMY Right 05/26/2014   Procedure: RIGHT FOOT LAPIDUS BUNION CORRECTION AMD MODIFIED MCBRIDE BUNIONECTOMY;  Surgeon: Wylene Simmer, MD;  Location: [facility];  Service: Orthopedics;  Laterality: Right;  . CESAREAN SECTION  1986  . correction of bunionectomy  2016   right foot   . GANGLION  CYST EXCISION     wrist  . NASAL SEPTUM SURGERY  1981  . ROTATOR CUFF REPAIR W/ DISTAL CLAVICLE EXCISION Left 08/18/2008  . SHOULDER ARTHROSCOPY Left 06/07/2008  . thumb surgery  07-28-2015   left thumb knuckle collasped and had surgery   . TONSILLECTOMY  1964  . TUBAL LIGATION  1999    Social History   Social History  . Marital status: Married    Spouse name: N/A  . Number of children: 2  . Years of education: N/A   Occupational History  . Not on file.   Social History Main Topics  . Smoking status: Former Smoker    Quit date: 09/12/2005  . Smokeless tobacco: Never Used  . Alcohol use 0.0 oz/week     Comment: occasionally  . Drug use: No  . Sexual activity: Not on file   Other Topics Concern  . 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 Cancer Mother     lung  . Diabetes Mother   . Thalassemia Mother   . Colon polyps Mother   . Cancer Brother     lymphatic  . Heart disease Paternal Aunt   . Atrial fibrillation Father   . Thalassemia Sister   . Thalassemia Maternal Aunt   . Colon cancer Neg Hx   . Rectal cancer  Neg Hx   . Stomach cancer Neg Hx     ROS: Arthralgias but no fevers or chills, productive cough, hemoptysis, dysphasia, odynophagia, melena, hematochezia, dysuria, hematuria, rash, seizure activity, orthopnea, PND, pedal edema, claudication. Remaining systems are negative.  Physical Exam: Well-developed morbidly obese  in no acute distress.  Skin is warm and dry.  HEENT is normal.  Neck is supple.  Chest is clear to auscultation with normal expansion.  Cardiovascular exam is regular rate and rhythm.  Abdominal exam nontender or distended. No masses palpated. Extremities show no edema. neuro grossly intact  A/P  1 chest pain-symptoms are atypical. However her nuclear study is mildly abnormal. I explained that it is a low risk study and could be caused by shifting breast attenuation.  However she has continuing symptoms and is extremely concerned about these. She would like definitive evaluation. I will arrange a cardiac catheterization. The risks and benefits including myocardial infarction, CVA and death discussed and she agrees to proceed. Add aspirin daily.  2 hyperlipidemia-she did not tolerate Crestor. She has not tolerated Lipitor previously. She has been reviewed by lipid clinic and has been approved for repatha.  3 elevated blood pressure-she is extremely anxious in the office today and states that her blood pressure is typically 130/80. Follow and treat as needed.    Kirk Ruths, MD

## 2016-06-28 NOTE — Research (Signed)
CADLAD Informed Consent   Subject Name: [name]  Subject met inclusion and exclusion criteria.  The informed consent form, study requirements and expectations were reviewed with the subject and questions and concerns were addressed prior to the signing of the consent form.  The subject verbalized understanding of the trail requirements.  The subject agreed to participate in the CADLAD trial and signed the informed consent.  The informed consent was obtained prior to performance of any protocol-specific procedures for the subject.  A copy of the signed informed consent was given to the subject and a copy was placed in the subject's medical record.  [name] 06/28/2016,9:10

## 2016-06-28 NOTE — Interval H&P Note (Signed)
History and Physical Interval Note:  06/28/2016 3:22 PM  [name]  has presented today for surgery, with the diagnosis of abnormal nuc  The various methods of treatment have been discussed with the patient and family. After consideration of risks, benefits and other options for treatment, the patient has consented to  Procedure(s): Left Heart Cath and Coronary Angiography (N/A) as a surgical intervention .  The patient's history has been reviewed, patient examined, no change in status, stable for surgery.  I have reviewed the patient's chart and labs.  Questions were answered to the patient's satisfaction.   Cath Lab Visit (complete for each Cath Lab visit)  Clinical Evaluation Leading to the Procedure:   ACS: No.  Non-ACS:    Anginal Classification: CCS II  Anti-ischemic medical therapy: No Therapy  Non-Invasive Test Results: Intermediate-risk stress test findings: cardiac mortality 1-3%/year  Prior CABG: No previous CABG        [name] [facility] 06/28/2016 3:22 PM

## 2016-07-01 ENCOUNTER — Encounter (HOSPITAL_COMMUNITY): Payer: Self-pay | Admitting: Cardiology

## 2016-07-02 ENCOUNTER — Telehealth: Payer: Self-pay | Admitting: Pharmacist

## 2016-07-02 NOTE — Telephone Encounter (Signed)
New message      Returning a call to [facility] the pharmacist

## 2016-07-03 NOTE — Telephone Encounter (Signed)
Spoke to pt she states that [facility] was unable to fill for Repatha. She states they told her they were unable to obtain the medication.  After discussion will call [facility] outpatient pharmacy and see if they are able to fill RX for her there. If able will send there and notify her.

## 2016-07-04 MED ORDER — EVOLOCUMAB 140 MG/ML ~~LOC~~ SOAJ: 140.0000 mg | SUBCUTANEOUS | 1 refills | Status: DC

## 2016-07-04 NOTE — Telephone Encounter (Signed)
Spoke to [facility] pharmacy yesterday and they are unable to fill medication based on insurance. Spoke with pt and sent Rx to insurance preferred specialty pharmacy [phone]).   Pt states understanding and will call when she receives her first shipment.

## 2016-07-09 ENCOUNTER — Other Ambulatory Visit: Payer: Self-pay | Admitting: Cardiology

## 2016-07-09 NOTE — Telephone Encounter (Signed)
Push Option 2-Need diagnosis code for her [place] please.

## 2016-07-10 ENCOUNTER — Ambulatory Visit: Payer: Commercial Managed Care - HMO | Admitting: Cardiology

## 2016-07-11 ENCOUNTER — Telehealth: Payer: Self-pay | Admitting: Cardiology

## 2016-07-11 NOTE — Telephone Encounter (Signed)
Spoke to [place] at [facility] to give diagnosis code. She states that they were attempting to process through medical benefit. Informed her that usually Repatha is covered through RX coverage and authorization has already been obtained for this medication. She states that she will forward to insurance verification with details discussed above and the patient will be notified. I gave her our direct line to call with any additional questions concerning this case.

## 2016-07-11 NOTE — Telephone Encounter (Signed)
Pt calling asking for [place] to send in fax rx for Repatha to [facility] at  [phone]

## 2016-07-11 NOTE — Telephone Encounter (Signed)
Follow up      Calling to give [facility] correct info for calling in repatha.  To verbal call it in, please call [phone] or fax it to [phone].  This is the [facility] home dept [facility].  The previous fax number was not the home dept.

## 2016-07-12 NOTE — Telephone Encounter (Signed)
Returned call to pt and she states that she was told by insurance to call Rx to [facility]. This has been done.   She will call when she receives her first shipment.

## 2016-08-05 NOTE — Progress Notes (Deleted)
HPI: FU hyperlipidemia and abnormal nuclear study. Echo 2004 with normal LV function; mild LAE. Nuclear study November 2017 showed ejection fraction 63%. There was a small defect suggestive of ischemia in the apical lateral wall. Cath 11/17 showed normal coronaries and normal LV function. Has not tolerated lipitor or crestor. Now followed in lipid clinic. Since last seen,   Current Outpatient Prescriptions  Medication Sig Dispense Refill  . albuterol (PROVENTIL HFA;VENTOLIN HFA) 108 (90 BASE) MCG/ACT inhaler Inhale 2 puffs into the lungs every 4 (four) hours as needed for wheezing or shortness of breath. 1 Inhaler 1  . Evolocumab (REPATHA SURECLICK) [id number] MG/ML SOAJ Inject 140 mg into the skin every 14 (fourteen) days. 6 pen 1  . OVER THE COUNTER MEDICATION Juice plus chewable daily     No current facility-administered medications for this visit.      Past Medical History:  Diagnosis Date  . Allergy   . Arthritis    neck, knees, ankles, feet, back  . Asthma    prn inhaler  . Borderline diabetes mellitus   . Dental crowns present   . Hallux valgus of right foot 05/2014  . History of TIA (transient ischemic attack) 2004  . Hyperlipidemia   . Hypertension   . Limited joint range of motion    neck - states has bone spurs C2-5  . TMJ syndrome     Past Surgical History:  Procedure Laterality Date  . ACROMIONECTOMY Left 08/18/2008  . APPENDECTOMY  1979  . BUNIONECTOMY Left 07/17/2007  . BUNIONECTOMY Right 05/26/2014   Procedure: RIGHT FOOT LAPIDUS BUNION CORRECTION AMD MODIFIED MCBRIDE BUNIONECTOMY;  Surgeon: Wylene Simmer, MD;  Location: [facility];  Service: Orthopedics;  Laterality: Right;  . CARDIAC CATHETERIZATION N/A 06/28/2016   Procedure: Left Heart Cath and Coronary Angiography;  Surgeon: Peter M Martinique, MD;  Location: [facility];  Service: Cardiovascular;  Laterality: N/A;  . CESAREAN SECTION  1986  . correction of bunionectomy  2016   right  foot   . GANGLION CYST EXCISION     wrist  . NASAL SEPTUM SURGERY  1981  . ROTATOR CUFF REPAIR W/ DISTAL CLAVICLE EXCISION Left 08/18/2008  . SHOULDER ARTHROSCOPY Left 06/07/2008  . thumb surgery  07-28-2015   left thumb knuckle collasped and had surgery   . TONSILLECTOMY  1964  . TUBAL LIGATION  1999    Social History   Social History  . Marital status: Married    Spouse name: N/A  . Number of children: 2  . Years of education: N/A   Occupational History  . Not on file.   Social History Main Topics  . Smoking status: Former Smoker    Quit date: 09/12/2005  . Smokeless tobacco: Never Used  . Alcohol use 0.0 oz/week     Comment: occasionally  . Drug use: No  . Sexual activity: Not on file   Other Topics Concern  . 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 Cancer Mother     lung  . Diabetes Mother   . Thalassemia Mother   . Colon polyps Mother   . Cancer Brother     lymphatic  . Heart disease Paternal Aunt   . Atrial fibrillation Father   . Thalassemia Sister   . Thalassemia Maternal Aunt   . Colon cancer Neg Hx   . Rectal  cancer Neg Hx   . Stomach cancer Neg Hx     ROS: no fevers or chills, productive cough, hemoptysis, dysphasia, odynophagia, melena, hematochezia, dysuria, hematuria, rash, seizure activity, orthopnea, PND, pedal edema, claudication. Remaining systems are negative.  Physical Exam: Well-developed well-nourished in no acute distress.  Skin is warm and dry.  HEENT is normal.  Neck is supple.  Chest is clear to auscultation with normal expansion.  Cardiovascular exam is regular rate and rhythm.  Abdominal exam nontender or distended. No masses palpated. Extremities show no edema. neuro grossly intact  ECG

## 2016-08-06 ENCOUNTER — Telehealth: Payer: Self-pay | Admitting: Cardiology

## 2016-08-06 DIAGNOSIS — E785 Hyperlipidemia, unspecified: Secondary | ICD-10-CM

## 2016-08-06 NOTE — Telephone Encounter (Signed)
Please call,she wanted to talk to [place] about her Repatha. She says she needs her blood work now.

## 2016-08-06 NOTE — Telephone Encounter (Signed)
Patient started on Repatha 140 mg q2wks,  Will have her get Lipid panel after 4th or 5th dose.  Labs mailed to patient.

## 2016-08-13 ENCOUNTER — Ambulatory Visit: Payer: Commercial Managed Care - HMO | Admitting: Cardiology

## 2016-08-15 ENCOUNTER — Ambulatory Visit: Payer: Commercial Managed Care - HMO | Admitting: Cardiology

## 2016-08-23 DIAGNOSIS — J3089 Other allergic rhinitis: Secondary | ICD-10-CM | POA: Diagnosis not present

## 2016-08-23 DIAGNOSIS — H1045 Other chronic allergic conjunctivitis: Secondary | ICD-10-CM | POA: Diagnosis not present

## 2016-08-23 DIAGNOSIS — J4530 Mild persistent asthma, uncomplicated: Secondary | ICD-10-CM | POA: Diagnosis not present

## 2016-08-23 DIAGNOSIS — J329 Chronic sinusitis, unspecified: Secondary | ICD-10-CM | POA: Diagnosis not present

## 2016-08-23 DIAGNOSIS — R21 Rash and other nonspecific skin eruption: Secondary | ICD-10-CM | POA: Diagnosis not present

## 2016-08-24 DIAGNOSIS — M1711 Unilateral primary osteoarthritis, right knee: Secondary | ICD-10-CM | POA: Diagnosis not present

## 2016-08-26 ENCOUNTER — Other Ambulatory Visit: Payer: Self-pay | Admitting: Nurse Practitioner

## 2016-08-26 DIAGNOSIS — Z20828 Contact with and (suspected) exposure to other viral communicable diseases: Secondary | ICD-10-CM

## 2016-08-26 MED ORDER — OSELTAMIVIR PHOSPHATE 75 MG PO CAPS: 75.0000 mg | ORAL_CAPSULE | Freq: Every day | ORAL | 0 refills | Status: DC

## 2016-08-26 NOTE — Progress Notes (Signed)
Husband: Mery Deater tested positive for influenza today.

## 2016-09-02 ENCOUNTER — Encounter: Payer: Self-pay | Admitting: Internal Medicine

## 2016-09-02 ENCOUNTER — Ambulatory Visit (INDEPENDENT_AMBULATORY_CARE_PROVIDER_SITE_OTHER)
Admission: RE | Admit: 2016-09-02 | Discharge: 2016-09-02 | Disposition: A | Discharge status: Home / Self Care | Payer: Commercial Managed Care - HMO | Source: Ambulatory Visit | Attending: Internal Medicine | Admitting: Internal Medicine

## 2016-09-02 ENCOUNTER — Ambulatory Visit (INDEPENDENT_AMBULATORY_CARE_PROVIDER_SITE_OTHER): Payer: Commercial Managed Care - HMO | Admitting: Internal Medicine

## 2016-09-02 VITALS — BP 120/80 | HR 95 | Temp 98.2°F | Resp 12 | Ht 63.0 in | Wt 254.0 lb

## 2016-09-02 DIAGNOSIS — R0602 Shortness of breath: Secondary | ICD-10-CM | POA: Diagnosis not present

## 2016-09-02 DIAGNOSIS — R05 Cough: Secondary | ICD-10-CM | POA: Diagnosis not present

## 2016-09-02 DIAGNOSIS — J4531 Mild persistent asthma with (acute) exacerbation: Secondary | ICD-10-CM

## 2016-09-02 MED ORDER — PREDNISONE 20 MG PO TABS: 40.0000 mg | ORAL_TABLET | Freq: Every day | ORAL | 0 refills | Status: DC

## 2016-09-02 MED ORDER — IPRATROPIUM-ALBUTEROL 0.5-2.5 (3) MG/3ML IN SOLN
3.0000 mL | Freq: Once | RESPIRATORY_TRACT | Status: AC
  Administered 2016-09-02: 3 mL via RESPIRATORY_TRACT

## 2016-09-02 NOTE — Progress Notes (Signed)
Pre visit review using our clinic review tool, if applicable. No additional management support is needed unless otherwise documented below in the visit note. 

## 2016-09-02 NOTE — Patient Instructions (Signed)
We have sent in prednisone to help the lungs. Take 2 pills a day for 5 days.   We are checking the x-ray today and if there are any changes we will let you know and call in an antibiotic.   We have given you the nebulizer treatment today while you are here.

## 2016-09-02 NOTE — Progress Notes (Signed)
   Subjective:    Patient ID: [name], female    DOB: 06-15-1956, 61 y.o.   MRN: [id number]  HPI The patient is a 61 YO female coming in for SOB and cough. She was diagnosed with the flu about 1 week ago and given tamiflu. She still feels bad and feels like her chest is tight. She denies fevers or chills. Sick contacts at home. She does have albuterol inhaler at home and using more often. Denies congestion or nose symptoms. She does have asthma and feels like she is starting to have problems.   Review of Systems  Constitutional: Positive for activity change and fatigue. Negative for appetite change, chills, fever and unexpected weight change.  HENT: Negative.  Negative for congestion.   Eyes: Negative.   Respiratory: Positive for cough, chest tightness, shortness of breath and wheezing.   Cardiovascular: Negative.   Gastrointestinal: Negative.   Musculoskeletal: Negative.   Skin: Negative.       Objective:   Physical Exam  Constitutional: She is oriented to person, place, and time. She appears well-developed and well-nourished.  HENT:  Head: Normocephalic and atraumatic.  Right Ear: External ear normal.  Left Ear: External ear normal.  Mouth/Throat: Oropharynx is clear and moist.  Eyes: EOM are normal.  Neck: Normal range of motion. No JVD present.  Cardiovascular: Normal rate and regular rhythm.   Pulmonary/Chest: Effort normal. No respiratory distress. She has wheezes. She has no rales.  Some wheezing and rhonchi which partially clear with duoneb.   Abdominal: Soft.  Lymphadenopathy:    She has no cervical adenopathy.  Neurological: She is alert and oriented to person, place, and time. Coordination normal.  Skin: Skin is warm and dry.    Vitals:   09/02/16 1426  BP: 120/80  Pulse: 95  Resp: 12  Temp: 98.2 F (36.8 C)  TempSrc: Oral  SpO2: 98%  Weight: 254 lb (115.2 kg)  Height: 5\' 3"  (1.6 m)      Assessment & Plan:  Duoneb given at visit.

## 2016-09-04 NOTE — Progress Notes (Signed)
HPI: FU hyperlipidemia and chest pain. Echo 2004 with normal LV function; mild LAE. Nuclear study September 2017 showed ejection fraction 63%. There was a small defect suggestive of ischemia in the apical lateral wall. Cardiac catheterization November 2017 showed normal LV function and normal coronary anatomy. Labs 6/17 showed total chol 265, LDL 192, HDL 38 and triglycerides 267. Pt intolerant to statins and referred to lipid clinic; started on repatha. Since last seen, she does have dyspnea on exertion but no orthopnea, PND or pedal edema. No recurrent chest pain or syncope.  Current Outpatient Prescriptions  Medication Sig Dispense Refill  . albuterol (PROVENTIL HFA;VENTOLIN HFA) 108 (90 BASE) MCG/ACT inhaler Inhale 2 puffs into the lungs every 4 (four) hours as needed for wheezing or shortness of breath. 1 Inhaler 1  . cetirizine (ZYRTEC) 10 MG tablet TK 1 T PO QD  5  . Evolocumab (REPATHA SURECLICK) XX123456 MG/ML SOAJ Inject 140 mg into the skin every 14 (fourteen) days. 6 pen 1  . FLOVENT HFA 110 MCG/ACT inhaler INHALE 2 PUFFS INTO THE LUNGS BID  0  . fluticasone (FLONASE) 50 MCG/ACT nasal spray SHAKE LQ AND U 2 SPRAYS IEN QD  5  . OVER THE COUNTER MEDICATION Juice plus chewable daily     No current facility-administered medications for this visit.      Past Medical History:  Diagnosis Date  . Allergy   . Arthritis    neck, knees, ankles, feet, back  . Asthma    prn inhaler  . Borderline diabetes mellitus   . Dental crowns present   . Hallux valgus of right foot 05/2014  . History of TIA (transient ischemic attack) 2004  . Hyperlipidemia   . Hypertension   . Limited joint range of motion    neck - states has bone spurs C2-5  . TMJ syndrome     Past Surgical History:  Procedure Laterality Date  . ACROMIONECTOMY Left 08/18/2008  . APPENDECTOMY  1979  . BUNIONECTOMY Left 07/17/2007  . BUNIONECTOMY Right 05/26/2014   Procedure: RIGHT FOOT LAPIDUS BUNION CORRECTION AMD  MODIFIED MCBRIDE BUNIONECTOMY;  Surgeon: Wylene Simmer, MD;  Location: [facility];  Service: Orthopedics;  Laterality: Right;  . CARDIAC CATHETERIZATION N/A 06/28/2016   Procedure: Left Heart Cath and Coronary Angiography;  Surgeon: Peter M Martinique, MD;  Location: [facility];  Service: Cardiovascular;  Laterality: N/A;  . CESAREAN SECTION  1986  . correction of bunionectomy  2016   right foot   . GANGLION CYST EXCISION     wrist  . NASAL SEPTUM SURGERY  1981  . ROTATOR CUFF REPAIR W/ DISTAL CLAVICLE EXCISION Left 08/18/2008  . SHOULDER ARTHROSCOPY Left 06/07/2008  . thumb surgery  07-28-2015   left thumb knuckle collasped and had surgery   . TONSILLECTOMY  1964  . TUBAL LIGATION  1999    Social History   Social History  . Marital status: Married    Spouse name: N/A  . Number of children: 2  . Years of education: N/A   Occupational History  . Not on file.   Social History Main Topics  . Smoking status: Former Smoker    Quit date: 09/12/2005  . Smokeless tobacco: Never Used  . Alcohol use 0.0 oz/week     Comment: occasionally  . Drug use: No  . Sexual activity: Not on file   Other Topics Concern  . 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 Cancer Mother     lung  . Diabetes Mother   . Thalassemia Mother   . Colon polyps Mother   . Cancer Brother     lymphatic  . Heart disease Paternal Aunt   . Atrial fibrillation Father   . Thalassemia Sister   . Thalassemia Maternal Aunt   . Colon cancer Neg Hx   . Rectal cancer Neg Hx   . Stomach cancer Neg Hx     ROS: no fevers or chills, productive cough, hemoptysis, dysphasia, odynophagia, melena, hematochezia, dysuria, hematuria, rash, seizure activity, orthopnea, PND, pedal edema, claudication. Remaining systems are negative.  Physical Exam: Well-developed obese in no acute distress.  Skin is warm and dry.  HEENT is normal.  Neck is  supple. No bruits Chest is clear to auscultation with normal expansion.  Cardiovascular exam is regular rate and rhythm.  Abdominal exam nontender or distended. No masses palpated. Extremities show no edema. neuro grossly intact  A/P  1 Chest pain-symptoms have resolved. Catheterization reveals no coronary disease. No further evaluation indicated.  2 hyperlipidemia-patient is now on repatha.  3 Obesity-We discussed weight loss. She is interested in joining cardiac rehabilitation.  4 dyspnea-likely related to deconditioning and obesity hypoventilation syndrome.  Kirk Ruths, MD

## 2016-09-05 ENCOUNTER — Other Ambulatory Visit: Payer: Self-pay | Admitting: Cardiology

## 2016-09-05 LAB — LIPID PANEL
Cholesterol: 150 mg/dL (ref <200)
HDL: 48 mg/dL — AB (ref >50)
LDL Cholesterol: 64 mg/dL (ref <100)
Total CHOL/HDL Ratio: 3.1 Ratio (ref <5.0)
Triglycerides: 191 mg/dL — ABNORMAL HIGH (ref <150)
VLDL: 38 mg/dL — ABNORMAL HIGH (ref <30)

## 2016-09-06 NOTE — Assessment & Plan Note (Signed)
Given duoneb in the office and prednisone for flare. Lungs clear on exam so no indication for antibiotics. Checking CXR and treat with antibiotics only if changes. Likely post-viral flare.

## 2016-09-10 ENCOUNTER — Encounter: Payer: Self-pay | Admitting: Cardiology

## 2016-09-10 ENCOUNTER — Ambulatory Visit (INDEPENDENT_AMBULATORY_CARE_PROVIDER_SITE_OTHER): Payer: Commercial Managed Care - HMO | Admitting: Cardiology

## 2016-09-10 VITALS — BP 110/80 | HR 68 | Ht 63.0 in | Wt 264.0 lb

## 2016-09-10 DIAGNOSIS — R0609 Other forms of dyspnea: Secondary | ICD-10-CM | POA: Diagnosis not present

## 2016-09-10 DIAGNOSIS — E7800 Pure hypercholesterolemia, unspecified: Secondary | ICD-10-CM | POA: Diagnosis not present

## 2016-09-10 DIAGNOSIS — R0600 Dyspnea, unspecified: Secondary | ICD-10-CM

## 2016-09-10 NOTE — Patient Instructions (Signed)
Your physician wants you to follow-up in: [place] will receive a reminder letter in the mail two months in advance. If you don't receive a letter, please call our office to schedule the follow-up appointment.   If you need a refill on your cardiac medications before your next appointment, please call your pharmacy.

## 2016-09-23 ENCOUNTER — Encounter: Payer: Self-pay | Admitting: Internal Medicine

## 2016-09-23 ENCOUNTER — Ambulatory Visit (INDEPENDENT_AMBULATORY_CARE_PROVIDER_SITE_OTHER): Payer: Commercial Managed Care - HMO | Admitting: Internal Medicine

## 2016-09-23 VITALS — BP 138/90 | HR 92 | Temp 98.5°F | Resp 20 | Ht 63.0 in | Wt 257.5 lb

## 2016-09-23 DIAGNOSIS — B9789 Other viral agents as the cause of diseases classified elsewhere: Secondary | ICD-10-CM | POA: Diagnosis not present

## 2016-09-23 DIAGNOSIS — R059 Cough, unspecified: Secondary | ICD-10-CM

## 2016-09-23 DIAGNOSIS — J069 Acute upper respiratory infection, unspecified: Secondary | ICD-10-CM | POA: Diagnosis not present

## 2016-09-23 DIAGNOSIS — J4541 Moderate persistent asthma with (acute) exacerbation: Secondary | ICD-10-CM

## 2016-09-23 DIAGNOSIS — R05 Cough: Secondary | ICD-10-CM

## 2016-09-23 LAB — POCT EXHALED NITRIC OXIDE: FENO LEVEL (PPB): 63

## 2016-09-23 MED ORDER — METHYLPREDNISOLONE ACETATE 80 MG/ML IJ SUSP
120.0000 mg | Freq: Once | INTRAMUSCULAR | Status: AC
  Administered 2016-09-23: 120 mg via INTRAMUSCULAR

## 2016-09-23 MED ORDER — FLUTICASONE FUROATE-VILANTEROL 200-25 MCG/INH IN AEPB: 1.0000 | INHALATION_SPRAY | Freq: Every day | RESPIRATORY_TRACT | 3 refills | Status: DC

## 2016-09-23 MED ORDER — HYDROCODONE-HOMATROPINE 5-1.5 MG/5ML PO SYRP: 5.0000 mL | ORAL_SOLUTION | Freq: Three times a day (TID) | ORAL | 0 refills | Status: DC | PRN

## 2016-09-23 NOTE — Progress Notes (Signed)
Pre visit review using our clinic review tool, if applicable. No additional management support is needed unless otherwise documented below in the visit note. 

## 2016-09-23 NOTE — Progress Notes (Signed)
Subjective:  Patient ID: [name], female    DOB: 04-11-1956  Age: 61 y.o. MRN: [id number]  CC: Cough  NEW TO ME  HPI [name] presents for a 4 day history of nonproductive cough, sore throat, laryngitis, wheezing, and postnasal drip. She tells me she has been feeling poorly for about for 5 months and is been seen by her primary care physician as well as allergy and ENT. She tells me that she has undergone an endoscopy of her nasopharynx and nothing abnormal was found. She has also been seen by allergy and is told that she has dust mite and roach allergy. She has been trying to control her symptoms with Flovent and albuterol. She is also taking Singulair and Zyrtec. She had a chest x-ray done about a month ago that was suspicious for interstitial pneumonia.  Outpatient Medications Prior to Visit  Medication Sig Dispense Refill  . albuterol (PROVENTIL HFA;VENTOLIN HFA) 108 (90 BASE) MCG/ACT inhaler Inhale 2 puffs into the lungs every 4 (four) hours as needed for wheezing or shortness of breath. 1 Inhaler 1  . cetirizine (ZYRTEC) 10 MG tablet TK 1 T PO QD  5  . Evolocumab (REPATHA SURECLICK) [id number] MG/ML SOAJ Inject 140 mg into the skin every 14 (fourteen) days. 6 pen 1  . fluticasone (FLONASE) 50 MCG/ACT nasal spray SHAKE LQ AND U 2 SPRAYS IEN QD  5  . OVER THE COUNTER MEDICATION Juice plus chewable daily    . FLOVENT HFA 110 MCG/ACT inhaler INHALE 2 PUFFS INTO THE LUNGS BID  0   No facility-administered medications prior to visit.     ROS Review of Systems  Constitutional: Negative for activity change, chills, diaphoresis, fatigue and fever.  HENT: Positive for postnasal drip, rhinorrhea, sore throat and voice change. Negative for sinus pressure and trouble swallowing.   Eyes: Negative.   Respiratory: Positive for cough and wheezing. Negative for apnea, choking, chest tightness, shortness of breath and stridor.   Cardiovascular: Negative for chest pain, palpitations and leg  swelling.  Gastrointestinal: Negative for abdominal pain, constipation, nausea and vomiting.  Endocrine: Negative.   Genitourinary: Negative.  Negative for difficulty urinating.  Musculoskeletal: Negative for back pain, myalgias and neck pain.  Skin: Negative.  Negative for color change and rash.  Allergic/Immunologic: Negative.   Neurological: Negative.  Negative for dizziness.  Hematological: Negative.  Negative for adenopathy. Does not bruise/bleed easily.  Psychiatric/Behavioral: Negative.     Objective:  BP 138/90 (BP Location: Left Arm, Patient Position: Sitting, Cuff Size: Large)   Pulse 92   Temp 98.5 F (36.9 C) (Oral)   Resp 20   Ht 5\' 3"  (1.6 m)   Wt 257 lb 8 oz (116.8 kg)   SpO2 97%   BMI 45.61 kg/m   BP Readings from Last 3 Encounters:  09/23/16 138/90  09/10/16 110/80  09/02/16 120/80    Wt Readings from Last 3 Encounters:  09/23/16 257 lb 8 oz (116.8 kg)  09/10/16 264 lb (119.7 kg)  09/02/16 254 lb (115.2 kg)    Physical Exam  Constitutional: She is oriented to person, place, and time. No distress.  HENT:  Right Ear: Hearing, tympanic membrane, external ear and ear canal normal.  Left Ear: Hearing, tympanic membrane, external ear and ear canal normal.  Nose: Mucosal edema and rhinorrhea present. No sinus tenderness. No epistaxis. Right sinus exhibits no maxillary sinus tenderness and no frontal sinus tenderness. Left sinus exhibits no maxillary sinus tenderness and no frontal sinus tenderness.  Mouth/Throat: Oropharynx is clear and moist and mucous membranes are normal. Mucous membranes are not pale, not dry and not cyanotic. No oral lesions. No trismus in the jaw. No uvula swelling. No oropharyngeal exudate, posterior oropharyngeal edema, posterior oropharyngeal erythema or tonsillar abscesses.  Eyes: Conjunctivae are normal. Right eye exhibits no discharge. Left eye exhibits no discharge. No scleral icterus.  Neck: Normal range of motion. Neck supple. No  JVD present. No tracheal deviation present. No thyromegaly present.  Cardiovascular: Normal rate, regular rhythm, normal heart sounds and intact distal pulses.  Exam reveals no gallop and no friction rub.   No murmur heard. Pulmonary/Chest: Effort normal and breath sounds normal. No stridor. No respiratory distress. She has no wheezes. She has no rales. She exhibits no tenderness.  Abdominal: Soft. Bowel sounds are normal. She exhibits no distension and no mass. There is no tenderness. There is no rebound and no guarding.  Musculoskeletal: Normal range of motion. She exhibits no edema, tenderness or deformity.  Lymphadenopathy:    She has no cervical adenopathy.  Neurological: She is oriented to person, place, and time.  Skin: Skin is warm and dry. No rash noted. She is not diaphoretic. No erythema. No pallor.  Vitals reviewed.   Lab Results  Component Value Date   WBC 6.2 06/26/2016   HGB 14.1 06/26/2016   HCT 42.4 06/26/2016   PLT 237 06/26/2016   GLUCOSE 99 06/26/2016   CHOL 150 09/05/2016   TRIG 191 (H) 09/05/2016   HDL 48 (L) 09/05/2016   LDLDIRECT 192.0 01/12/2016   LDLCALC 64 09/05/2016   ALT 24 06/13/2016   AST 18 06/13/2016   NA 137 06/26/2016   K 3.9 06/26/2016   CL 103 06/26/2016   CREATININE 0.86 06/26/2016   BUN 19 06/26/2016   CO2 26 06/26/2016   TSH 2.62 03/14/2015   INR 1.0 06/26/2016   HGBA1C 5.9 03/14/2015   MICROALBUR 0.9 11/26/2007    Dg Chest 2 View  Result Date: 09/02/2016 CLINICAL DATA:  Cough, congestion, shortness of breath and fever for 5 days. EXAM: CHEST  2 VIEW COMPARISON:  10/18/2014. FINDINGS: Trachea is midline. Heart size normal. Lungs are somewhat low in volume with possible mild interstitial prominence at the lung bases. No airspace consolidation or pleural fluid. IMPRESSION: Difficult to exclude mild interstitial prominence, and therefore an infectious bronchiolitis or viral pneumonia, at the lung bases. Electronically Signed   By: Lorin Picket M.D.   On: 09/02/2016 17:05    Assessment & Plan:   Amelinda was seen today for cough.  Diagnoses and all orders for this visit:  Cough- I will recheck her CXR, she has an elevated FeNO score so will treat with systemic steroids -     POCT EXHALED NITRIC OXIDE -     DG Chest 2 View; Future  Moderate persistent asthma with exacerbation- her FeNO score is >50 so will upgrade to a more potent ICS and will try a course of systemic steroids -     fluticasone furoate-vilanterol (BREO ELLIPTA) 200-25 MCG/INH AEPB; Inhale 1 puff into the lungs daily.  Viral URI with cough- will control the cough with hycodan -     HYDROcodone-homatropine (HYCODAN) 5-1.5 MG/5ML syrup; Take 5 mLs by mouth every 8 (eight) hours as needed for cough.   I have discontinued Ms. Tufte's FLOVENT HFA. I am also having her start on fluticasone furoate-vilanterol and HYDROcodone-homatropine. Additionally, I am having her maintain her albuterol, OVER THE COUNTER MEDICATION, Evolocumab, fluticasone, cetirizine,  and montelukast.  Meds ordered this encounter  Medications  . montelukast (SINGULAIR) 10 MG tablet    Sig: TK 1 T PO QD IN THE EVE    Refill:  5  . fluticasone furoate-vilanterol (BREO ELLIPTA) 200-25 MCG/INH AEPB    Sig: Inhale 1 puff into the lungs daily.    Dispense:  90 each    Refill:  3  . HYDROcodone-homatropine (HYCODAN) 5-1.5 MG/5ML syrup    Sig: Take 5 mLs by mouth every 8 (eight) hours as needed for cough.    Dispense:  120 mL    Refill:  0     Follow-up: No Follow-up on file.  Scarlette Calico, MD

## 2016-09-23 NOTE — Patient Instructions (Signed)
Cough, Adult Coughing is a reflex that clears your throat and your airways. Coughing helps to heal and protect your lungs. It is normal to cough occasionally, but a cough that happens with other symptoms or lasts a long time may be a sign of a condition that needs treatment. A cough may last only 2-3 weeks (acute), or it may last longer than 8 weeks (chronic). What are the causes? Coughing is commonly caused by:  Breathing in substances that irritate your lungs.  A viral or bacterial respiratory infection.  Allergies.  Asthma.  Postnasal drip.  Smoking.  Acid backing up from the stomach into the esophagus (gastroesophageal reflux).  Certain medicines.  Chronic lung problems, including COPD (or rarely, lung cancer).  Other medical conditions such as heart failure.  Follow these instructions at home: Pay attention to any changes in your symptoms. Take these actions to help with your discomfort:  Take medicines only as told by your health care provider. ? If you were prescribed an antibiotic medicine, take it as told by your health care provider. Do not stop taking the antibiotic even if you start to feel better. ? Talk with your health care provider before you take a cough suppressant medicine.  Drink enough fluid to keep your urine clear or pale yellow.  If the air is dry, use a cold steam vaporizer or humidifier in your bedroom or your home to help loosen secretions.  Avoid anything that causes you to cough at work or at home.  If your cough is worse at night, try sleeping in a semi-upright position.  Avoid cigarette smoke. If you smoke, quit smoking. If you need help quitting, ask your health care provider.  Avoid caffeine.  Avoid alcohol.  Rest as needed.  Contact a health care provider if:  You have new symptoms.  You cough up pus.  Your cough does not get better after 2-3 weeks, or your cough gets worse.  You cannot control your cough with suppressant  medicines and you are losing sleep.  You develop pain that is getting worse or pain that is not controlled with pain medicines.  You have a fever.  You have unexplained weight loss.  You have night sweats. Get help right away if:  You cough up blood.  You have difficulty breathing.  Your heartbeat is very fast. This information is not intended to replace advice given to you by your health care provider. Make sure you discuss any questions you have with your health care provider. Document Released: 01/18/2011 Document Revised: 12/28/2015 Document Reviewed: 09/28/2014 Elsevier Interactive Patient Education  2017 [employer]  

## 2016-09-24 ENCOUNTER — Other Ambulatory Visit: Payer: Self-pay | Admitting: Internal Medicine

## 2016-09-24 ENCOUNTER — Telehealth: Payer: Self-pay | Admitting: Internal Medicine

## 2016-09-24 MED ORDER — AZITHROMYCIN 500 MG PO TABS: 500.0000 mg | ORAL_TABLET | Freq: Every day | ORAL | 0 refills | Status: DC

## 2016-09-24 NOTE — Telephone Encounter (Signed)
Patient was here yesterday and saw Dr. Ronnald Ramp. She woke up this morning and her throat feels like razor blades are In it she states. She really worried she has strep throat her grandchildren have it. Please advise if she does need another appointment or what we need to do. Thank you.

## 2016-09-24 NOTE — Telephone Encounter (Signed)
done

## 2016-09-24 NOTE — Telephone Encounter (Signed)
Antibiotic RX sent to her pharmacy

## 2016-09-25 ENCOUNTER — Encounter (HOSPITAL_COMMUNITY): Payer: Self-pay

## 2016-09-25 ENCOUNTER — Emergency Department (HOSPITAL_COMMUNITY): Payer: Commercial Managed Care - HMO

## 2016-09-25 ENCOUNTER — Emergency Department (HOSPITAL_COMMUNITY)
Admission: EM | Admit: 2016-09-25 | Discharge: 2016-09-25 | Disposition: A | Discharge status: Home / Self Care | Payer: Commercial Managed Care - HMO | Attending: Emergency Medicine | Admitting: Emergency Medicine

## 2016-09-25 DIAGNOSIS — J45909 Unspecified asthma, uncomplicated: Secondary | ICD-10-CM | POA: Insufficient documentation

## 2016-09-25 DIAGNOSIS — R05 Cough: Secondary | ICD-10-CM | POA: Diagnosis not present

## 2016-09-25 DIAGNOSIS — Z87891 Personal history of nicotine dependence: Secondary | ICD-10-CM | POA: Diagnosis not present

## 2016-09-25 DIAGNOSIS — J42 Unspecified chronic bronchitis: Secondary | ICD-10-CM | POA: Diagnosis not present

## 2016-09-25 DIAGNOSIS — Z8673 Personal history of transient ischemic attack (TIA), and cerebral infarction without residual deficits: Secondary | ICD-10-CM | POA: Diagnosis not present

## 2016-09-25 DIAGNOSIS — R0689 Other abnormalities of breathing: Secondary | ICD-10-CM | POA: Diagnosis not present

## 2016-09-25 DIAGNOSIS — R0600 Dyspnea, unspecified: Secondary | ICD-10-CM | POA: Diagnosis not present

## 2016-09-25 DIAGNOSIS — I10 Essential (primary) hypertension: Secondary | ICD-10-CM | POA: Diagnosis not present

## 2016-09-25 DIAGNOSIS — J189 Pneumonia, unspecified organism: Secondary | ICD-10-CM | POA: Diagnosis present

## 2016-09-25 LAB — CBC WITH DIFFERENTIAL/PLATELET
Basophils Absolute: 0.1 10*3/uL (ref 0.0–0.1)
Basophils Relative: 1 %
Eosinophils Absolute: 0.3 10*3/uL (ref 0.0–0.7)
Eosinophils Relative: 3 %
HCT: 41 % (ref 36.0–46.0)
Hemoglobin: 13.5 g/dL (ref 12.0–15.0)
Lymphocytes Relative: 20 %
Lymphs Abs: 1.5 10*3/uL (ref 0.7–4.0)
MCH: 28.7 pg (ref 26.0–34.0)
MCHC: 32.9 g/dL (ref 30.0–36.0)
MCV: 87.2 fL (ref 78.0–100.0)
Monocytes Absolute: 0.5 10*3/uL (ref 0.1–1.0)
Monocytes Relative: 7 %
Neutro Abs: 5.3 10*3/uL (ref 1.7–7.7)
Neutrophils Relative %: 69 %
Platelets: 228 10*3/uL (ref 150–400)
RBC: 4.7 MIL/uL (ref 3.87–5.11)
RDW: 13.3 % (ref 11.5–15.5)
WBC: 7.7 10*3/uL (ref 4.0–10.5)

## 2016-09-25 LAB — TROPONIN I: Troponin I: <0.03 ng/mL (ref <0.03)

## 2016-09-25 LAB — BASIC METABOLIC PANEL
Anion gap: 7 (ref 5–15)
BUN: 15 mg/dL (ref 6–20)
CO2: 28 mmol/L (ref 22–32)
Calcium: 9.6 mg/dL (ref 8.9–10.3)
Chloride: 104 mmol/L (ref 101–111)
Creatinine, Ser: 0.85 mg/dL (ref 0.44–1.00)
GFR calc Af Amer: >60 mL/min (ref >60)
GFR calc non Af Amer: >60 mL/min (ref >60)
Glucose, Bld: 102 mg/dL — ABNORMAL HIGH (ref 65–99)
Potassium: 3.8 mmol/L (ref 3.5–5.1)
Sodium: 139 mmol/L (ref 135–145)

## 2016-09-25 LAB — BRAIN NATRIURETIC PEPTIDE: B Natriuretic Peptide: 52.9 pg/mL (ref 0.0–100.0)

## 2016-09-25 MED ORDER — BENZONATATE 100 MG PO CAPS: 100.0000 mg | ORAL_CAPSULE | Freq: Three times a day (TID) | ORAL | 0 refills | Status: DC

## 2016-09-25 MED ORDER — SODIUM CHLORIDE 0.9 % IJ SOLN
INTRAMUSCULAR | Status: AC
  Filled 2016-09-25: qty 50

## 2016-09-25 MED ORDER — IOPAMIDOL (ISOVUE-370) INJECTION 76%
INTRAVENOUS | Status: AC
  Filled 2016-09-25: qty 100

## 2016-09-25 MED ORDER — IOPAMIDOL (ISOVUE-370) INJECTION 76%
100.0000 mL | Freq: Once | INTRAVENOUS | Status: AC | PRN
  Administered 2016-09-25: 100 mL via INTRAVENOUS

## 2016-09-25 MED ORDER — IPRATROPIUM-ALBUTEROL 0.5-2.5 (3) MG/3ML IN SOLN
3.0000 mL | Freq: Once | RESPIRATORY_TRACT | Status: AC
  Administered 2016-09-25: 3 mL via RESPIRATORY_TRACT
  Filled 2016-09-25: qty 3

## 2016-09-25 NOTE — ED Provider Notes (Signed)
CT negative. Pt recommended to f/u for repeat imaging to track incidental nodule with PCP.    Leo Grosser, MD 09/25/16 [phone]

## 2016-09-25 NOTE — Discharge Instructions (Addendum)
YOU HAVE AN INCIDENTAL FINDING OF A NODULE ON YOUR LUNG WHICH WILL REQUIRE FOLLOW UP IMAGING NON-EMERGENTLY WITH YOUR PRIMARY CARE PHYSICIAN.

## 2016-09-25 NOTE — ED Triage Notes (Signed)
Pt was dx'd with pneumonia on Jan 24. She was seen at her MDs office on Monday, yesterday and today they told her to come here when she went to their office. She was given cough syrup (Hycodan), but it makes her hallucinate. She was also started on azithromycin. She states that she came bc when she lays down, she feels like she is suffocating. She is also complaining of a sore throat. Hx of asthma. A&Ox4.

## 2016-09-25 NOTE — ED Provider Notes (Signed)
[facility] DEPT Provider Note   By signing my name below, I, Bea Graff, attest that this documentation has been prepared under the direction and in the presence of Virgel Manifold, MD. Electronically Signed: Bea Graff, ED Scribe. 09/25/16. 2:31 PM.    History   Chief Complaint Chief Complaint  Patient presents with  . Pneumonia    The history is provided by the patient and medical records. No language interpreter was used.    [name] is a 61 y.o. obese female with PMHx of asthma who presents to the Emergency Department complaining of symptoms of pneumonia that began about one month ago. She reports associated subjected fever, sore throat, dry cough, wheezing and orthopnea that began last night. She states she was diagnosed with pneumonia by her PCP about one month ago . She was prescribed a Z-Pak. She was seen by her PCP two days ago and was given an injection of steroids. She states she is not getting any better so she called her PCP and was told to come here for evaluation. She states she has been on and off antibiotics for the past five months for what was thought to be a sinus infection but was later told by an allergist she was allergic to dust mites. She has taken Hycodan prescribed by her PCP but states it makes her itch. Lying back increases her SOB. She denies alleviating factors. She denies extremity swelling, hematochezia. She denies any recent surgeries or long trips.   Past Medical History:  Diagnosis Date  . Allergy   . Arthritis    neck, knees, ankles, feet, back  . Asthma    prn inhaler  . Borderline diabetes mellitus   . Dental crowns present   . Hallux valgus of right foot 05/2014  . History of TIA (transient ischemic attack) 2004  . Hyperlipidemia   . Hypertension   . Limited joint range of motion    neck - states has bone spurs C2-5  . TMJ syndrome     Patient Active Problem List   Diagnosis Date Noted  . Abnormal nuclear stress test  06/28/2016  . Morbid obesity ([place]) 03/19/2016  . History of TIA (transient ischemic attack) 03/14/2015  . Cough 10/15/2014  . Cervicalgia 04/11/2014  . Routine general medical examination at a health care facility 11/23/2012  . Low back pain 11/02/2012  . Neuralgia 03/16/2012  . Acute recurrent pansinusitis 04/12/2011  . GERD 06/14/2009  . Essential hypertension 06/16/2008  . Hyperlipidemia 12/29/2007  . Hyperglycemia 11/26/2007  . Asthma exacerbation 11/26/2007    Past Surgical History:  Procedure Laterality Date  . ACROMIONECTOMY Left 08/18/2008  . APPENDECTOMY  1979  . BUNIONECTOMY Left 07/17/2007  . BUNIONECTOMY Right 05/26/2014   Procedure: RIGHT FOOT LAPIDUS BUNION CORRECTION AMD MODIFIED MCBRIDE BUNIONECTOMY;  Surgeon: Wylene Simmer, MD;  Location: [facility];  Service: Orthopedics;  Laterality: Right;  . CARDIAC CATHETERIZATION N/A 06/28/2016   Procedure: Left Heart Cath and Coronary Angiography;  Surgeon: Peter M Martinique, MD;  Location: [facility];  Service: Cardiovascular;  Laterality: N/A;  . CESAREAN SECTION  1986  . correction of bunionectomy  2016   right foot   . GANGLION CYST EXCISION     wrist  . NASAL SEPTUM SURGERY  1981  . ROTATOR CUFF REPAIR W/ DISTAL CLAVICLE EXCISION Left 08/18/2008  . SHOULDER ARTHROSCOPY Left 06/07/2008  . thumb surgery  07-28-2015   left thumb knuckle collasped and had surgery   . TONSILLECTOMY  [place]    OB History    No data available       Home Medications    Prior to Admission medications   Medication Sig Start Date End Date Taking? Authorizing Provider  albuterol (PROVENTIL HFA;VENTOLIN HFA) 108 (90 BASE) MCG/ACT inhaler Inhale 2 puffs into the lungs every 4 (four) hours as needed for wheezing or shortness of breath. 10/15/14   Golden Circle, FNP  azithromycin (ZITHROMAX) 500 MG tablet Take 1 tablet (500 mg total) by mouth daily. 09/24/16   Janith Lima, MD  cetirizine (ZYRTEC)  10 MG tablet TK 1 T PO QD 08/23/16   Historical Provider, MD  Evolocumab (REPATHA SURECLICK) [id number] MG/ML SOAJ Inject 140 mg into the skin every 14 (fourteen) days. 07/04/16   Lelon Perla, MD  fluticasone (FLONASE) 50 MCG/ACT nasal spray SHAKE LQ AND U 2 SPRAYS IEN QD 08/23/16   Historical Provider, MD  fluticasone furoate-vilanterol (BREO ELLIPTA) 200-25 MCG/INH AEPB Inhale 1 puff into the lungs daily. 09/23/16   Janith Lima, MD  HYDROcodone-homatropine [facility]) 5-1.5 MG/5ML syrup Take 5 mLs by mouth every 8 (eight) hours as needed for cough. 09/23/16   Janith Lima, MD  montelukast (SINGULAIR) 10 MG tablet TK 1 T PO QD IN THE EVE 09/14/16   Historical Provider, MD  OVER THE COUNTER MEDICATION Juice plus chewable daily    Historical Provider, MD    Family History Family History  Problem Relation Age of Onset  . Cancer Mother     lung  . Diabetes Mother   . Thalassemia Mother   . Colon polyps Mother   . Cancer Brother     lymphatic  . Heart disease Paternal Aunt   . Atrial fibrillation Father   . Thalassemia Sister   . Thalassemia Maternal Aunt   . Colon cancer Neg Hx   . Rectal cancer Neg Hx   . Stomach cancer Neg Hx     Social History Social History  Substance Use Topics  . Smoking status: Former Smoker    Quit date: 09/12/2005  . Smokeless tobacco: Never Used  . Alcohol use 0.0 oz/week     Comment: occasionally     Allergies   Doxycycline; Penicillins; Promethazine hcl; Clarithromycin; and Statins   Review of Systems Review of Systems A complete 10 system review of systems was obtained and all systems are negative except as noted in the HPI and PMH.    Physical Exam Updated Vital Signs BP 134/99 (BP Location: Left Arm)   Pulse 92   Temp 98.8 F (37.1 C) (Oral)   Resp 18   Ht 5\' 3"  (1.6 m)   Wt 257 lb (116.6 kg)   SpO2 97%   BMI 45.53 kg/m   Physical Exam  Constitutional: She is oriented to person, place, and time. She appears well-developed and  well-nourished. No distress.  HENT:  Head: Normocephalic and atraumatic.  Pharyngitis without exudates.  Eyes: EOM are normal.  Neck: Normal range of motion.  Cardiovascular: Normal rate, regular rhythm and normal heart sounds.   Pulmonary/Chest: Effort normal. Tachypnea noted. She has rhonchi in the left lower field.  Left lower lobe rhonchi.   Abdominal: Soft. She exhibits no distension. There is no tenderness.  Musculoskeletal: Normal range of motion.  Neurological: She is alert and oriented to person, place, and time.  Skin: Skin is warm and dry.  Psychiatric: She has a normal mood and affect. Judgment normal.  Nursing note and vitals reviewed.    ED Treatments / Results  DIAGNOSTIC STUDIES: Oxygen Saturation is 97% on RA, normal by my interpretation.   COORDINATION OF CARE: 2:27 PM- Will CT chest. Pt verbalizes understanding and agrees to plan.  Medications - No data to display  Labs (all labs ordered are listed, but only abnormal results are displayed) Labs Reviewed - No data to display  EKG  EKG Interpretation None       Radiology No results found.   Ct Angio Chest Pe W And/or Wo Contrast  Result Date: 09/25/2016 CLINICAL DATA:  Diagnosed with pneumonia on January 24th.  Dyspnea. EXAM: CT ANGIOGRAPHY CHEST WITH CONTRAST TECHNIQUE: Multidetector CT imaging of the chest was performed using the standard protocol during bolus administration of intravenous contrast. Multiplanar CT image reconstructions and MIPs were obtained to evaluate the vascular anatomy. CONTRAST:  100 cc of Isovue 370 COMPARISON:  09/02/2016 FINDINGS: Cardiovascular: Mild cardiac enlargement. No pericardial effusion. Aortic atherosclerosis noted. Mediastinum/Nodes: The trachea appears patent and is midline. Normal appearance of the esophagus. No mediastinal or hilar adenopathy. Lungs/Pleura: No pleural effusion identified. The lungs appear suboptimally inflated with dependent changes in both lung  bases. No interstitial edema or airspace consolidation. Scar identified within the lingula. Anterior right upper lobe nodule measures 3 mm, image number 43 of series 7 Upper Abdomen: 8 mm low-attenuation structure within the lateral segment of left lobe of liver is too small to reliably characterize. No acute abnormality identified. Musculoskeletal: No chest wall abnormality. No acute or significant osseous findings. Review of the MIP images confirms the above findings. IMPRESSION: 1. Lungs are hypoinflated with dependent changes bilaterally. 2. Small nodule within the right lung measures 3 mm No follow-up needed if patient is low-risk. Non-contrast chest CT can be considered in 12 months if patient is high-risk. This recommendation follows the consensus statement: Guidelines for Management of Incidental Pulmonary Nodules Detected on CT Images: From the Fleischner Society 2017; Radiology 2017; 284:228-243. 3. Aortic atherosclerosis Electronically Signed   By: Kerby Moors M.D.   On: 09/25/2016 18:11    Procedures Procedures (including critical care time)  Medications Ordered in ED Medications - No data to display   Initial Impression / Assessment and Plan / ED Course  I have reviewed the triage vital signs and the nursing notes.  Pertinent labs & imaging results that were available during my care of the patient were reviewed by me and considered in my medical decision making (see chart for details).     I personally preformed the services scribed in my presence. The recorded information has been reviewed is accurate. Virgel Manifold, MD.   Final Clinical Impressions(s) / ED Diagnoses   Final diagnoses:  Chronic bronchitis, unspecified chronic bronchitis type [facility])    New Prescriptions New Prescriptions   No medications on file     Virgel Manifold, MD 10/13/16 [id number]

## 2016-11-27 ENCOUNTER — Telehealth (HOSPITAL_COMMUNITY): Payer: Self-pay | Admitting: Internal Medicine

## 2016-11-27 NOTE — Telephone Encounter (Signed)
I called and left message on patient voicemail to return my call in reference to Cardiac Rehab. I left my contact information on patient voicemail. Per Carlette, patient would be unable to participate in the monitored Cardiac Rehab program due dx. Patient can be offered the maintenance program, which is $68.00 a month. If patient agrees to maintenance, patient information will be given to the person who handles the maintenance program.

## 2016-12-04 ENCOUNTER — Telehealth (HOSPITAL_COMMUNITY): Payer: Self-pay | Admitting: Internal Medicine

## 2016-12-04 NOTE — Telephone Encounter (Signed)
*  Second phone call*  I called and left message on patient voicemail to return my call in reference to Cardiac Rehab. I left my contact information on patient voicemail. Per Carlette, patient would be unable to participate in the monitored Cardiac Rehab program due dx. Patient can be offered the maintenance program, which is $68.00 a month. If patient agrees to maintenance, patient information will be given to the person who handles the maintenance program.

## 2016-12-09 ENCOUNTER — Other Ambulatory Visit: Payer: Self-pay | Admitting: Cardiology

## 2016-12-13 ENCOUNTER — Telehealth (HOSPITAL_COMMUNITY): Payer: Self-pay | Admitting: Internal Medicine

## 2016-12-13 NOTE — Telephone Encounter (Signed)
Patient has not responded to phone calls. I mailed patient information about maintenance program brochure. If patient does not respond to letter, referral will be closed.

## 2016-12-23 ENCOUNTER — Ambulatory Visit (INDEPENDENT_AMBULATORY_CARE_PROVIDER_SITE_OTHER): Payer: Commercial Managed Care - HMO | Admitting: Internal Medicine

## 2016-12-23 ENCOUNTER — Encounter: Payer: Self-pay | Admitting: Internal Medicine

## 2016-12-23 ENCOUNTER — Other Ambulatory Visit (INDEPENDENT_AMBULATORY_CARE_PROVIDER_SITE_OTHER): Payer: Commercial Managed Care - HMO

## 2016-12-23 VITALS — BP 138/88 | HR 79 | Temp 97.9°F | Resp 14 | Ht 63.0 in | Wt 258.0 lb

## 2016-12-23 DIAGNOSIS — J4520 Mild intermittent asthma, uncomplicated: Secondary | ICD-10-CM | POA: Diagnosis not present

## 2016-12-23 DIAGNOSIS — E7800 Pure hypercholesterolemia, unspecified: Secondary | ICD-10-CM

## 2016-12-23 DIAGNOSIS — R739 Hyperglycemia, unspecified: Secondary | ICD-10-CM | POA: Diagnosis not present

## 2016-12-23 DIAGNOSIS — Z0000 Encounter for general adult medical examination without abnormal findings: Secondary | ICD-10-CM | POA: Diagnosis not present

## 2016-12-23 DIAGNOSIS — I10 Essential (primary) hypertension: Secondary | ICD-10-CM

## 2016-12-23 LAB — COMPREHENSIVE METABOLIC PANEL
ALK PHOS: 89 U/L (ref 39–117)
ALT: 20 U/L (ref 0–35)
AST: 15 U/L (ref 0–37)
Albumin: 4.4 g/dL (ref 3.5–5.2)
BUN: 20 mg/dL (ref 6–23)
CHLORIDE: 104 meq/L (ref 96–112)
CO2: 29 mEq/L (ref 19–32)
Calcium: 9.5 mg/dL (ref 8.4–10.5)
Creatinine, Ser: 0.85 mg/dL (ref 0.40–1.20)
GFR: 72.86 mL/min (ref >60.00)
GLUCOSE: 102 mg/dL — AB (ref 70–99)
POTASSIUM: 4.1 meq/L (ref 3.5–5.1)
Sodium: 139 mEq/L (ref 135–145)
TOTAL PROTEIN: 7.2 g/dL (ref 6.0–8.3)
Total Bilirubin: 0.5 mg/dL (ref 0.2–1.2)

## 2016-12-23 LAB — CBC
HCT: 41.5 % (ref 36.0–46.0)
HEMOGLOBIN: 13.9 g/dL (ref 12.0–15.0)
MCHC: 33.5 g/dL (ref 30.0–36.0)
MCV: 88.1 fl (ref 78.0–100.0)
PLATELETS: 209 10*3/uL (ref 150.0–400.0)
RBC: 4.71 Mil/uL (ref 3.87–5.11)
RDW: 14.1 % (ref 11.5–15.5)
WBC: 6.8 10*3/uL (ref 4.0–10.5)

## 2016-12-23 LAB — LIPID PANEL
Cholesterol: 134 mg/dL (ref 0–200)
HDL: 50.9 mg/dL (ref >39.00)
LDL CALC: 53 mg/dL (ref 0–99)
NONHDL: 83.39
Total CHOL/HDL Ratio: 3
Triglycerides: 152 mg/dL — ABNORMAL HIGH (ref 0.0–149.0)
VLDL: 30.4 mg/dL (ref 0.0–40.0)

## 2016-12-23 LAB — HEMOGLOBIN A1C: HEMOGLOBIN A1C: 6 % (ref 4.6–6.5)

## 2016-12-23 NOTE — Assessment & Plan Note (Signed)
Working on weight loss with diet changes and exercising.

## 2016-12-23 NOTE — Progress Notes (Signed)
   Subjective:    Patient ID: [name], female    DOB: 05/12/1956, 61 y.o.   MRN: [id number]  HPI The patient is a 61 YO female coming in for wellness. No new concerns. Some life stress but denies depression or anxiety.   PMH, [facility], social history reviewed and updated.   Review of Systems  Constitutional: Negative.   HENT: Negative.   Eyes: Negative.   Respiratory: Negative for cough, chest tightness and shortness of breath.   Cardiovascular: Negative for chest pain, palpitations and leg swelling.  Gastrointestinal: Negative for abdominal distention, abdominal pain, constipation, diarrhea, nausea and vomiting.  Musculoskeletal: Negative.   Skin: Negative.   Neurological: Negative.   Psychiatric/Behavioral: Negative.       Objective:   Physical Exam  Constitutional: She is oriented to person, place, and time. She appears well-developed and well-nourished.  HENT:  Head: Normocephalic and atraumatic.  Eyes: EOM are normal.  Neck: Normal range of motion.  Cardiovascular: Normal rate and regular rhythm.   Pulmonary/Chest: Effort normal and breath sounds normal. No respiratory distress. She has no wheezes. She has no rales.  Abdominal: Soft. Bowel sounds are normal. She exhibits no distension. There is no tenderness. There is no rebound.  Musculoskeletal: She exhibits no edema.  Neurological: She is alert and oriented to person, place, and time. Coordination normal.  Skin: Skin is warm and dry.  Psychiatric: She has a normal mood and affect.   Vitals:   12/23/16 0804  BP: (!) 150/90  Pulse: 79  Resp: 14  Temp: 97.9 F (36.6 C)  TempSrc: Oral  SpO2: 99%  Weight: 258 lb (117 kg)  Height: 5\' 3"  (1.6 m)      Assessment & Plan:

## 2016-12-23 NOTE — Assessment & Plan Note (Signed)
Taking singulair and breo with albuterol prn and maintaining. No flare today.

## 2016-12-23 NOTE — Assessment & Plan Note (Signed)
BP at goal off meds and she does monitor at home.

## 2016-12-23 NOTE — Assessment & Plan Note (Signed)
Colonoscopy due in 2022, counseled about shingrix and she will return for this. Checking labs, counseled about sun safety and mole surveillance. Given screening recommendations. Mammogram and pap smear up to date with gyn.

## 2016-12-23 NOTE — Assessment & Plan Note (Signed)
Taking repatha and checking lipid panel.

## 2016-12-23 NOTE — Assessment & Plan Note (Signed)
Checking HgA1c, has never had diabetes and have removed to clarify medical history.

## 2016-12-23 NOTE — Patient Instructions (Signed)
Schedule a visit to get the shingles shot shingrix which is 2 shots at least 2 months apart.   We are checking the labs today and will send you the results.  Health Maintenance, Female Adopting a healthy lifestyle and getting preventive care can go a long way to promote health and wellness. Talk with your health care provider about what schedule of regular examinations is right for you. This is a good chance for you to check in with your provider about disease prevention and staying healthy. In between checkups, there are plenty of things you can do on your own. Experts have done a lot of research about which lifestyle changes and preventive measures are most likely to keep you healthy. Ask your health care provider for more information. Weight and diet Eat a healthy diet  Be sure to include plenty of vegetables, fruits, low-fat dairy products, and lean protein.  Do not eat a lot of foods high in solid fats, added sugars, or salt.  Get regular exercise. This is one of the most important things you can do for your health.  Most adults should exercise for at least 150 minutes each week. The exercise should increase your heart rate and make you sweat (moderate-intensity exercise).  Most adults should also do strengthening exercises at least twice a week. This is in addition to the moderate-intensity exercise. Maintain a healthy weight  Body mass index (BMI) is a measurement that can be used to identify possible weight problems. It estimates body fat based on height and weight. Your health care provider can help determine your BMI and help you achieve or maintain a healthy weight.  For females 59 years of age and older:  A BMI below 18.5 is considered underweight.  A BMI of 18.5 to 24.9 is normal.  A BMI of 25 to 29.9 is considered overweight.  A BMI of 30 and above is considered obese. Watch levels of cholesterol and blood lipids  You should start having your blood tested for lipids  and cholesterol at 59 years of age, then have this test every 5 years.  You may need to have your cholesterol levels checked more often if:  Your lipid or cholesterol levels are high.  You are older than 59 years of age.  You are at high risk for heart disease. Cancer screening Lung Cancer  Lung cancer screening is recommended for adults 59-18 years old who are at high risk for lung cancer because of a history of smoking.  A yearly low-dose CT scan of the lungs is recommended for people who:  Currently smoke.  Have quit within the past 15 years.  Have at least a 30-pack-year history of smoking. A pack year is smoking an average of one pack of cigarettes a day for 1 year.  Yearly screening should continue until it has been 59 years since you quit.  Yearly screening should stop if you develop a health problem that would prevent you from having lung cancer treatment. Breast Cancer  Practice breast self-awareness. This means understanding how your breasts normally appear and feel.  It also means doing regular breast self-exams. Let your health care provider know about any changes, no matter how small.  If you are in your 59s or 30s, you should have a clinical breast exam (CBE) by a health care provider every 1-3 years as part of a regular health exam.  If you are 63 or older, have a CBE every year. Also consider having a breast X-ray (  mammogram) every year.  If you have a family history of breast cancer, talk to your health care provider about genetic screening.  If you are at high risk for breast cancer, talk to your health care provider about having an MRI and a mammogram every year.  Breast cancer gene (BRCA) assessment is recommended for women who have family members with BRCA-related cancers. BRCA-related cancers include:  Breast.  Ovarian.  Tubal.  Peritoneal cancers.  Results of the assessment will determine the need for genetic counseling and BRCA1 and BRCA2  testing. Cervical Cancer  Your health care provider may recommend that you be screened regularly for cancer of the pelvic organs (ovaries, uterus, and vagina). This screening involves a pelvic examination, including checking for microscopic changes to the surface of your cervix (Pap test). You may be encouraged to have this screening done every 3 years, beginning at age 21.  For women ages 30-65, health care providers may recommend pelvic exams and Pap testing every 3 years, or they may recommend the Pap and pelvic exam, combined with testing for human papilloma virus (HPV), every 5 years. Some types of HPV increase your risk of cervical cancer. Testing for HPV may also be done on women of any age with unclear Pap test results.  Other health care providers may not recommend any screening for nonpregnant women who are considered low risk for pelvic cancer and who do not have symptoms. Ask your health care provider if a screening pelvic exam is right for you.  If you have had past treatment for cervical cancer or a condition that could lead to cancer, you need Pap tests and screening for cancer for at least 20 years after your treatment. If Pap tests have been discontinued, your risk factors (such as having a new sexual partner) need to be reassessed to determine if screening should resume. Some women have medical problems that increase the chance of getting cervical cancer. In these cases, your health care provider may recommend more frequent screening and Pap tests. Colorectal Cancer  This type of cancer can be detected and often prevented.  Routine colorectal cancer screening usually begins at 59 years of age and continues through 59 years of age.  Your health care provider may recommend screening at an earlier age if you have risk factors for colon cancer.  Your health care provider may also recommend using home test kits to check for hidden blood in the stool.  A small camera at the end of a  tube can be used to examine your colon directly (sigmoidoscopy or colonoscopy). This is done to check for the earliest forms of colorectal cancer.  Routine screening usually begins at age 50.  Direct examination of the colon should be repeated every 5-10 years through 59 years of age. However, you may need to be screened more often if early forms of precancerous polyps or small growths are found. Skin Cancer  Check your skin from head to toe regularly.  Tell your health care provider about any new moles or changes in moles, especially if there is a change in a mole's shape or color.  Also tell your health care provider if you have a mole that is larger than the size of a pencil eraser.  Always use sunscreen. Apply sunscreen liberally and repeatedly throughout the day.  Protect yourself by wearing long sleeves, pants, a wide-brimmed hat, and sunglasses whenever you are outside. Heart disease, diabetes, and high blood pressure  High blood pressure causes heart disease   and increases the risk of stroke. High blood pressure is more likely to develop in:  People who have blood pressure in the high end of the normal range (130-139/85-89 mm Hg).  People who are overweight or obese.  People who are African American.  If you are 18-39 years of age, have your blood pressure checked every 3-5 years. If you are 40 years of age or older, have your blood pressure checked every year. You should have your blood pressure measured twice-once when you are at a hospital or clinic, and once when you are not at a hospital or clinic. Record the average of the two measurements. To check your blood pressure when you are not at a hospital or clinic, you can use:  An automated blood pressure machine at a pharmacy.  A home blood pressure monitor.  If you are between 55 years and 79 years old, ask your health care provider if you should take aspirin to prevent strokes.  Have regular diabetes screenings. This  involves taking a blood sample to check your fasting blood sugar level.  If you are at a normal weight and have a low risk for diabetes, have this test once every three years after 59 years of age.  If you are overweight and have a high risk for diabetes, consider being tested at a younger age or more often. Preventing infection Hepatitis B  If you have a higher risk for hepatitis B, you should be screened for this virus. You are considered at high risk for hepatitis B if:  You were born in a country where hepatitis B is common. Ask your health care provider which countries are considered high risk.  Your parents were born in a high-risk country, and you have not been immunized against hepatitis B (hepatitis B vaccine).  You have HIV or AIDS.  You use needles to inject street drugs.  You live with someone who has hepatitis B.  You have had sex with someone who has hepatitis B.  You get hemodialysis treatment.  You take certain medicines for conditions, including cancer, organ transplantation, and autoimmune conditions. Hepatitis C  Blood testing is recommended for:  Everyone born from 1945 through 1965.  Anyone with known risk factors for hepatitis C. Sexually transmitted infections (STIs)  You should be screened for sexually transmitted infections (STIs) including gonorrhea and chlamydia if:  You are sexually active and are younger than 59 years of age.  You are older than 59 years of age and your health care provider tells you that you are at risk for this type of infection.  Your sexual activity has changed since you were last screened and you are at an increased risk for chlamydia or gonorrhea. Ask your health care provider if you are at risk.  If you do not have HIV, but are at risk, it may be recommended that you take a prescription medicine daily to prevent HIV infection. This is called pre-exposure prophylaxis (PrEP). You are considered at risk if:  You are  sexually active and do not regularly use condoms or know the HIV status of your partner(s).  You take drugs by injection.  You are sexually active with a partner who has HIV. Talk with your health care provider about whether you are at high risk of being infected with HIV. If you choose to begin PrEP, you should first be tested for HIV. You should then be tested every 3 months for as long as you are taking PrEP. Pregnancy    If you are premenopausal and you may become pregnant, ask your health care provider about preconception counseling.  If you may become pregnant, take 400 to 800 micrograms (mcg) of folic acid every day.  If you want to prevent pregnancy, talk to your health care provider about birth control (contraception). Osteoporosis and menopause  Osteoporosis is a disease in which the bones lose minerals and strength with aging. This can result in serious bone fractures. Your risk for osteoporosis can be identified using a bone density scan.  If you are 65 years of age or older, or if you are at risk for osteoporosis and fractures, ask your health care provider if you should be screened.  Ask your health care provider whether you should take a calcium or vitamin D supplement to lower your risk for osteoporosis.  Menopause may have certain physical symptoms and risks.  Hormone replacement therapy may reduce some of these symptoms and risks. Talk to your health care provider about whether hormone replacement therapy is right for you. Follow these instructions at home:  Schedule regular health, dental, and eye exams.  Stay current with your immunizations.  Do not use any tobacco products including cigarettes, chewing tobacco, or electronic cigarettes.  If you are pregnant, do not drink alcohol.  If you are breastfeeding, limit how much and how often you drink alcohol.  Limit alcohol intake to no more than 1 drink per day for nonpregnant women. One drink equals 12 ounces of  beer, 5 ounces of wine, or 1 ounces of hard liquor.  Do not use street drugs.  Do not share needles.  Ask your health care provider for help if you need support or information about quitting drugs.  Tell your health care provider if you often feel depressed.  Tell your health care provider if you have ever been abused or do not feel safe at home. This information is not intended to replace advice given to you by your health care provider. Make sure you discuss any questions you have with your health care provider. Document Released: 02/04/2011 Document Revised: 12/28/2015 Document Reviewed: 04/25/2015 Elsevier Interactive Patient Education  2017 Elsevier Inc.  

## 2017-03-03 ENCOUNTER — Telehealth: Payer: Self-pay | Admitting: Cardiology

## 2017-03-03 NOTE — Telephone Encounter (Signed)
New message    [facility] is calling to follow up on a fax about pt repatha. She states it is no longer covered under pt plan.

## 2017-03-04 ENCOUNTER — Telehealth: Payer: Self-pay | Admitting: Cardiology

## 2017-03-04 MED ORDER — ALIROCUMAB 150 MG/ML ~~LOC~~ SOPN: 150.0000 mg | PEN_INJECTOR | SUBCUTANEOUS | 11 refills | Status: DC

## 2017-03-04 NOTE — Telephone Encounter (Signed)
Called insurance and was on hold for 10 minutes without answer. Will try again later.

## 2017-03-04 NOTE — Telephone Encounter (Signed)
[name] ( [employer]) calling about the Repatha Sureclick Pen .

## 2017-03-04 NOTE — Telephone Encounter (Signed)
Lauren from [employer]. Prefer formulary PCSK9 inhibitor for Ms [name] is Praluent.  Prior Authorization approved before  July 1st for Repatha will not be honored. Will require change to Praluent or new prio-authorization for repatha.   Patient agreed on change. New RX [facility]

## 2017-03-18 ENCOUNTER — Telehealth: Payer: Self-pay | Admitting: Internal Medicine

## 2017-03-18 NOTE — Telephone Encounter (Signed)
Pt called stating that she was here for a physical with Dr Sharlet Salina on 12/23/16. A week later she brought a form by that needed to be completed for her insurance. She said that she received a letter stating that the form had not been competed and if it was submitted then her premium would go up. Do you know where this form could be?

## 2017-03-19 NOTE — Telephone Encounter (Signed)
I have not ever seen this form. And I do not see where it has ever been scanned into the system.

## 2017-03-20 NOTE — Telephone Encounter (Signed)
Form was located. Pt would like the form faxed and the original mailed to her. A copy also need to be sent to scan.

## 2017-03-20 NOTE — Telephone Encounter (Signed)
Form has been filled out, signed, faxed, and sent out to be mailed

## 2017-03-27 DIAGNOSIS — M9901 Segmental and somatic dysfunction of cervical region: Secondary | ICD-10-CM | POA: Diagnosis not present

## 2017-03-27 DIAGNOSIS — M9902 Segmental and somatic dysfunction of thoracic region: Secondary | ICD-10-CM | POA: Diagnosis not present

## 2017-03-27 DIAGNOSIS — M9903 Segmental and somatic dysfunction of lumbar region: Secondary | ICD-10-CM | POA: Diagnosis not present

## 2017-04-29 DIAGNOSIS — M545 Low back pain: Secondary | ICD-10-CM | POA: Diagnosis not present

## 2017-04-29 DIAGNOSIS — M47812 Spondylosis without myelopathy or radiculopathy, cervical region: Secondary | ICD-10-CM | POA: Diagnosis not present

## 2017-04-29 DIAGNOSIS — M5416 Radiculopathy, lumbar region: Secondary | ICD-10-CM | POA: Diagnosis not present

## 2017-04-29 DIAGNOSIS — M4692 Unspecified inflammatory spondylopathy, cervical region: Secondary | ICD-10-CM | POA: Diagnosis not present

## 2017-04-29 DIAGNOSIS — M4316 Spondylolisthesis, lumbar region: Secondary | ICD-10-CM | POA: Diagnosis not present

## 2017-05-05 DIAGNOSIS — E119 Type 2 diabetes mellitus without complications: Secondary | ICD-10-CM | POA: Diagnosis not present

## 2017-06-05 DIAGNOSIS — E119 Type 2 diabetes mellitus without complications: Secondary | ICD-10-CM | POA: Diagnosis not present

## 2017-06-10 ENCOUNTER — Other Ambulatory Visit: Payer: Self-pay | Admitting: Internal Medicine

## 2017-06-10 ENCOUNTER — Ambulatory Visit: Payer: 59 | Admitting: Internal Medicine

## 2017-06-10 ENCOUNTER — Encounter: Payer: Self-pay | Admitting: Internal Medicine

## 2017-06-10 ENCOUNTER — Other Ambulatory Visit (INDEPENDENT_AMBULATORY_CARE_PROVIDER_SITE_OTHER): Payer: 59

## 2017-06-10 VITALS — BP 140/90 | HR 99 | Temp 98.3°F | Ht 63.0 in | Wt 261.0 lb

## 2017-06-10 DIAGNOSIS — R1013 Epigastric pain: Secondary | ICD-10-CM

## 2017-06-10 LAB — CBC
HEMATOCRIT: 43.9 % (ref 36.0–46.0)
Hemoglobin: 14.7 g/dL (ref 12.0–15.0)
MCHC: 33.6 g/dL (ref 30.0–36.0)
MCV: 88.2 fl (ref 78.0–100.0)
PLATELETS: 258 10*3/uL (ref 150.0–400.0)
RBC: 4.98 Mil/uL (ref 3.87–5.11)
RDW: 13.8 % (ref 11.5–15.5)
WBC: 8.7 10*3/uL (ref 4.0–10.5)

## 2017-06-10 LAB — H. PYLORI ANTIBODY, IGG: H PYLORI IGG: NEGATIVE

## 2017-06-10 MED ORDER — SUCRALFATE 1 G PO TABS: 1.0000 g | ORAL_TABLET | Freq: Three times a day (TID) | ORAL | 0 refills | Status: DC

## 2017-06-10 MED ORDER — MONTELUKAST SODIUM 10 MG PO TABS: 10.0000 mg | ORAL_TABLET | Freq: Every day | ORAL | 2 refills | Status: DC

## 2017-06-10 MED ORDER — PANTOPRAZOLE SODIUM 40 MG PO TBEC: 40.0000 mg | DELAYED_RELEASE_TABLET | Freq: Two times a day (BID) | ORAL | 2 refills | Status: DC

## 2017-06-10 MED ORDER — CETIRIZINE HCL 10 MG PO TABS: ORAL_TABLET | ORAL | 3 refills | Status: DC

## 2017-06-10 NOTE — Patient Instructions (Signed)
We will check the labs today.  We have sent in the protonix to take twice daily for the stomach. Take this for 1 month then try to come off. Call us back in about 2 weeks to let us know how you are doing.   We have also sent in carafate to take with meals to coat the stomach.

## 2017-06-10 NOTE — Progress Notes (Signed)
   Subjective:    Patient ID: [name], female    DOB: [birth year], 61 y.o.   MRN: [id number]  HPI The patient is a 61 YO female coming in for epigastric pain. Started about 1 month ago. She has been taking advil and aleve for back pain. She has been taking these for about 1-2 months. She is seeing neurosurgery and is getting an injection soon which she is hoping will help. She has been eating a lot of tums and milk. The milk helps to dull the pain. She has not tried omeprazole or nexium or pepcid or zantac over the counter. She is concerned there is an ulcer. She denies nausea but is not eating due to pain. She denies vomiting, diarrhea, constipation. Denies blood in stool or dark stools.   Review of Systems  Constitutional: Positive for activity change and appetite change.  HENT: Negative.   Eyes: Negative.   Respiratory: Negative for cough, chest tightness and shortness of breath.   Cardiovascular: Negative for chest pain, palpitations and leg swelling.  Gastrointestinal: Positive for abdominal distention and abdominal pain. Negative for constipation, diarrhea, nausea and vomiting.  Musculoskeletal: Negative.   Skin: Negative.   Neurological: Negative.       Objective:   Physical Exam  Constitutional: She is oriented to person, place, and time. She appears well-developed and well-nourished.  HENT:  Head: Normocephalic and atraumatic.  Eyes: EOM are normal.  Neck: Normal range of motion.  Cardiovascular: Normal rate and regular rhythm.  Pulmonary/Chest: Effort normal and breath sounds normal. No respiratory distress. She has no wheezes. She has no rales.  Abdominal: Soft. Bowel sounds are normal. She exhibits no distension. There is tenderness. There is no rebound.  Epigastric tenderness, no rebound or guarding  Musculoskeletal: She exhibits no edema.  Neurological: She is alert and oriented to person, place, and time. Coordination normal.  Skin: Skin is warm and dry.    Psychiatric: She has a normal mood and affect.   Vitals:   06/10/17 1600  BP: 140/90  Pulse: 99  Temp: 98.3 F (36.8 C)  TempSrc: Oral  SpO2: 98%  Weight: 261 lb (118.4 kg)  Height: 5\' 3"  (1.6 m)     Assessment & Plan:

## 2017-06-11 DIAGNOSIS — R1013 Epigastric pain: Secondary | ICD-10-CM

## 2017-06-11 HISTORY — DX: Epigastric pain: R10.13

## 2017-06-11 NOTE — Assessment & Plan Note (Signed)
Rx for protonix and sucralfate. Checking CBC and h pylori. Adjust as needed. Advised to stop all nsaids.

## 2017-07-05 DIAGNOSIS — E119 Type 2 diabetes mellitus without complications: Secondary | ICD-10-CM | POA: Diagnosis not present

## 2017-07-26 DIAGNOSIS — H6691 Otitis media, unspecified, right ear: Secondary | ICD-10-CM | POA: Diagnosis not present

## 2017-08-05 DIAGNOSIS — E119 Type 2 diabetes mellitus without complications: Secondary | ICD-10-CM | POA: Diagnosis not present

## 2017-08-27 DIAGNOSIS — H6691 Otitis media, unspecified, right ear: Secondary | ICD-10-CM | POA: Diagnosis not present

## 2017-08-27 DIAGNOSIS — J029 Acute pharyngitis, unspecified: Secondary | ICD-10-CM | POA: Diagnosis not present

## 2017-08-27 DIAGNOSIS — R030 Elevated blood-pressure reading, without diagnosis of hypertension: Secondary | ICD-10-CM | POA: Diagnosis not present

## 2017-09-05 DIAGNOSIS — E119 Type 2 diabetes mellitus without complications: Secondary | ICD-10-CM | POA: Diagnosis not present

## 2017-09-16 DIAGNOSIS — N760 Acute vaginitis: Secondary | ICD-10-CM | POA: Diagnosis not present

## 2017-09-16 DIAGNOSIS — N94819 Vulvodynia, unspecified: Secondary | ICD-10-CM | POA: Diagnosis not present

## 2017-10-03 DIAGNOSIS — E119 Type 2 diabetes mellitus without complications: Secondary | ICD-10-CM | POA: Diagnosis not present

## 2017-11-03 DIAGNOSIS — E119 Type 2 diabetes mellitus without complications: Secondary | ICD-10-CM | POA: Diagnosis not present

## 2017-12-03 DIAGNOSIS — E119 Type 2 diabetes mellitus without complications: Secondary | ICD-10-CM | POA: Diagnosis not present

## 2017-12-30 DIAGNOSIS — R030 Elevated blood-pressure reading, without diagnosis of hypertension: Secondary | ICD-10-CM | POA: Diagnosis not present

## 2017-12-30 DIAGNOSIS — Z0001 Encounter for general adult medical examination with abnormal findings: Secondary | ICD-10-CM | POA: Diagnosis not present

## 2017-12-30 DIAGNOSIS — E7800 Pure hypercholesterolemia, unspecified: Secondary | ICD-10-CM | POA: Diagnosis not present

## 2018-01-03 DIAGNOSIS — E119 Type 2 diabetes mellitus without complications: Secondary | ICD-10-CM | POA: Diagnosis not present

## 2018-01-06 DIAGNOSIS — R030 Elevated blood-pressure reading, without diagnosis of hypertension: Secondary | ICD-10-CM | POA: Diagnosis not present

## 2018-01-06 DIAGNOSIS — E7800 Pure hypercholesterolemia, unspecified: Secondary | ICD-10-CM | POA: Diagnosis not present

## 2018-01-06 DIAGNOSIS — R7309 Other abnormal glucose: Secondary | ICD-10-CM | POA: Diagnosis not present

## 2018-01-06 DIAGNOSIS — Z5181 Encounter for therapeutic drug level monitoring: Secondary | ICD-10-CM | POA: Diagnosis not present

## 2018-01-19 DIAGNOSIS — R7303 Prediabetes: Secondary | ICD-10-CM | POA: Diagnosis not present

## 2018-01-19 DIAGNOSIS — Z789 Other specified health status: Secondary | ICD-10-CM | POA: Diagnosis not present

## 2018-01-19 DIAGNOSIS — E7800 Pure hypercholesterolemia, unspecified: Secondary | ICD-10-CM | POA: Diagnosis not present

## 2018-02-02 DIAGNOSIS — E119 Type 2 diabetes mellitus without complications: Secondary | ICD-10-CM | POA: Diagnosis not present

## 2018-02-11 DIAGNOSIS — M79642 Pain in left hand: Secondary | ICD-10-CM | POA: Diagnosis not present

## 2018-02-11 DIAGNOSIS — M25542 Pain in joints of left hand: Secondary | ICD-10-CM | POA: Diagnosis not present

## 2018-02-16 DIAGNOSIS — Z1231 Encounter for screening mammogram for malignant neoplasm of breast: Secondary | ICD-10-CM | POA: Diagnosis not present

## 2018-02-19 ENCOUNTER — Encounter: Payer: Self-pay | Admitting: Internal Medicine

## 2018-02-19 DIAGNOSIS — R928 Other abnormal and inconclusive findings on diagnostic imaging of breast: Secondary | ICD-10-CM | POA: Diagnosis not present

## 2018-02-19 DIAGNOSIS — N6011 Diffuse cystic mastopathy of right breast: Secondary | ICD-10-CM | POA: Diagnosis not present

## 2018-02-19 LAB — HM MAMMOGRAPHY

## 2018-02-24 ENCOUNTER — Encounter: Payer: Self-pay | Admitting: Internal Medicine

## 2018-02-24 NOTE — Progress Notes (Signed)
Abstracted and sent to scan  

## 2018-03-05 DIAGNOSIS — E119 Type 2 diabetes mellitus without complications: Secondary | ICD-10-CM | POA: Diagnosis not present

## 2018-04-05 DIAGNOSIS — E119 Type 2 diabetes mellitus without complications: Secondary | ICD-10-CM | POA: Diagnosis not present

## 2018-04-09 NOTE — Progress Notes (Signed)
HPI: FU hyperlipidemia and chest pain. Echo 2004 with normal LV function; mild LAE. Nuclear study September 2017 showed ejection fraction 63%. There was a small defect suggestive of ischemia in the apical lateral wall. Cardiac catheterization November 2017 showed normal LV function and normal coronary anatomy. Pt intolerant to statins and referred to lipid clinic; started on repatha.  CTA February 2018 showed a small nodule and follow-up recommended in 12 months if patient high risk.  Since last seen, the patient has dyspnea with more extreme activities but not with routine activities. It is relieved with rest. It is not associated with chest pain. There is no orthopnea, PND or pedal edema. There is no syncope or palpitations. There is no exertional chest pain.   Current Outpatient Medications  Medication Sig Dispense Refill  . albuterol (PROVENTIL HFA;VENTOLIN HFA) 108 (90 BASE) MCG/ACT inhaler Inhale 2 puffs into the lungs every 4 (four) hours as needed for wheezing or shortness of breath. 1 Inhaler 1  . cetirizine (ZYRTEC) 10 MG tablet Take 10 mg by mouth at bedtime 90 tablet 3  . Evolocumab (REPATHA SURECLICK) 169 MG/ML SOAJ     . fluticasone (FLONASE) 50 MCG/ACT nasal spray SHAKE Liquid and Use 2 sprays in each nostril twice a day  5  . fluticasone (FLOVENT HFA) 110 MCG/ACT inhaler Inhale 2 puffs into the lungs every 4 (four) hours as needed (for shortness of breath).    . fluticasone furoate-vilanterol (BREO ELLIPTA) 200-25 MCG/INH AEPB Inhale 1 puff into the lungs daily. 90 each 3  . montelukast (SINGULAIR) 10 MG tablet Take 1 tablet (10 mg total) at bedtime by mouth. 90 tablet 2  . Nutritional Supplements (JUICE PLUS FIBRE PO) Take 4 each by mouth daily.     No current facility-administered medications for this visit.      Past Medical History:  Diagnosis Date  . Allergy   . Arthritis    neck, knees, ankles, feet, back  . Asthma    prn inhaler  . Dental crowns present   .  Epigastric pain 06/11/2017  . Essential hypertension 06/16/2008   Qualifier: Diagnosis of  By: Ronnald Ramp MD, [place].   . Hallux valgus of right foot 05/2014  . History of TIA (transient ischemic attack) 2004  . Hyperglycemia 11/26/2007   Qualifier: Diagnosis of  By: Larose Kells MD, [place] Hyperlipidemia   . Hypertension   . Limited joint range of motion    neck - states has bone spurs C2-5  . Mild intermittent asthma 11/26/2007      . Morbid obesity ([place]) 03/19/2016  . TMJ syndrome     Past Surgical History:  Procedure Laterality Date  . ACROMIONECTOMY Left 08/18/2008  . APPENDECTOMY  1979  . BUNIONECTOMY Left 07/17/2007  . BUNIONECTOMY Right 05/26/2014   Procedure: RIGHT FOOT LAPIDUS BUNION CORRECTION AMD MODIFIED MCBRIDE BUNIONECTOMY;  Surgeon: Wylene Simmer, MD;  Location: [facility];  Service: Orthopedics;  Laterality: Right;  . CARDIAC CATHETERIZATION N/A 06/28/2016   Procedure: Left Heart Cath and Coronary Angiography;  Surgeon: Peter M Martinique, MD;  Location: [facility];  Service: Cardiovascular;  Laterality: N/A;  . CESAREAN SECTION  1986  . correction of bunionectomy  2016   right foot   . GANGLION CYST EXCISION     wrist  . NASAL SEPTUM SURGERY  1981  . ROTATOR CUFF REPAIR W/ DISTAL CLAVICLE EXCISION Left 08/18/2008  . SHOULDER ARTHROSCOPY Left 06/07/2008  .  thumb surgery  07-28-2015   left thumb knuckle collasped and had surgery   . TONSILLECTOMY  1964  . TUBAL LIGATION  1999    Social History   Socioeconomic History  . Marital status: Married    Spouse name: Not on file  . Number of children: 2  . Years of education: Not on file  . Highest education level: Not on file  Occupational History  . Not on file  Social Needs  . Financial resource strain: Not on file  . Food insecurity:    Worry: Not on file    Inability: Not on file  . Transportation needs:    Medical: Not on file    Non-medical: Not on file  Tobacco Use  . Smoking status:  Former Smoker    Last attempt to quit: 09/12/2005    Years since quitting: 12.5  . Smokeless tobacco: Never Used  Substance and Sexual Activity  . Alcohol use: Yes    Alcohol/week: 0.0 standard drinks    Comment: occasionally  . Drug use: No  . Sexual activity: Not on file  Lifestyle  . Physical activity:    Days per week: Not on file    Minutes per session: Not on file  . Stress: Not on file  Relationships  . Social connections:    Talks on phone: Not on file    Gets together: Not on file    Attends religious service: Not on file    Active member of club or organization: Not on file    Attends meetings of clubs or organizations: Not on file    Relationship status: Not on file  . Intimate partner violence:    Fear of current or ex partner: Not on file    Emotionally abused: Not on file    Physically abused: Not on file    Forced sexual activity: Not on file  Other Topics Concern  . Not on file  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[employer]    Family History  Problem Relation Age of Onset  . Cancer Mother        lung  . Diabetes Mother   . Thalassemia Mother   . Colon polyps Mother   . Cancer Brother        lymphatic  . Heart disease Paternal Aunt   . Atrial fibrillation Father   . Thalassemia Sister   . Thalassemia Maternal Aunt   . Colon cancer Neg Hx   . Rectal cancer Neg Hx   . Stomach cancer Neg Hx     ROS: knee arthralgias but no fevers or chills, productive cough, hemoptysis, dysphasia, odynophagia, melena, hematochezia, dysuria, hematuria, rash, seizure activity, orthopnea, PND, pedal edema, claudication. Remaining systems are negative.  Physical Exam: Well-developed obese in no acute distress.  Skin is warm and dry.  HEENT is normal.  Neck is supple.  Chest is clear to auscultation with normal expansion.  Cardiovascular exam is regular rate and rhythm.  Abdominal exam nontender or distended. No masses palpated. Extremities show no edema. neuro grossly  intact  ECG-normal sinus rhythm at a rate of 79.  No ST changes.  Personally reviewed  A/P  1 chest pain-patient has had no further chest pain.  Previous catheterization revealed no coronary disease.  2 hyperlipidemia-patient is on Repatha.  3 obesity-we discussed the importance of weight loss and diet.  4 dyspnea-felt to be secondary to deconditioning and obesity hypoventilation syndrome.  5 lung nodule-noted on previous CT scan.  We will arrange noncontrast chest CT.  Kirk Ruths, MD

## 2018-04-13 ENCOUNTER — Encounter: Payer: Self-pay | Admitting: Cardiology

## 2018-04-13 ENCOUNTER — Encounter

## 2018-04-13 ENCOUNTER — Ambulatory Visit: Payer: 59 | Admitting: Cardiology

## 2018-04-13 VITALS — BP 122/94 | HR 79 | Ht 63.0 in | Wt 256.0 lb

## 2018-04-13 DIAGNOSIS — E7800 Pure hypercholesterolemia, unspecified: Secondary | ICD-10-CM | POA: Diagnosis not present

## 2018-04-13 DIAGNOSIS — Z79899 Other long term (current) drug therapy: Secondary | ICD-10-CM | POA: Diagnosis not present

## 2018-04-13 DIAGNOSIS — R918 Other nonspecific abnormal finding of lung field: Secondary | ICD-10-CM

## 2018-04-13 DIAGNOSIS — R0600 Dyspnea, unspecified: Secondary | ICD-10-CM | POA: Diagnosis not present

## 2018-04-13 NOTE — Patient Instructions (Addendum)
Medication Instructions: Your physician recommends that you continue on your current medications as directed.    If you need a refill on your cardiac medications before your next appointment, please call your pharmacy.   Labwork: Your physician recommends that you return for lab work in 6-8 weeks ( lipid, LFT)   Procedures/Testing: Non-Cardiac CT scanning, (CAT scanning), is a noninvasive, special x-ray that produces cross-sectional images of the body using x-rays and a computer. CT scans help physicians diagnose and treat medical conditions. For some CT exams, a contrast material is used to enhance visibility in the area of the body being studied. CT scans provide greater clarity and reveal more details than regular x-ray exams. [facility]   Follow-Up: Your physician wants you to follow-up in 1 year with  Dr. Trellis Moment will receive a reminder letter in the mail two months in advance. If you don't receive a letter, please call our office at [phone] to schedule this follow-up appointment.   Special Instructions:    Thank you for choosing [facility]!!

## 2018-04-14 ENCOUNTER — Other Ambulatory Visit: Payer: Self-pay | Admitting: Cardiology

## 2018-04-14 ENCOUNTER — Other Ambulatory Visit: Payer: Self-pay | Admitting: Pharmacist Clinician (PhC)/ Clinical Pharmacy Specialist

## 2018-04-14 MED ORDER — ALIROCUMAB 150 MG/ML ~~LOC~~ SOPN: 150.0000 mg | PEN_INJECTOR | SUBCUTANEOUS | 3 refills | Status: DC

## 2018-04-14 NOTE — Telephone Encounter (Signed)
Refill Brian Crenshaw  

## 2018-04-14 NOTE — Telephone Encounter (Signed)
Per insurance patient should be on Praluent 150 mg

## 2018-04-14 NOTE — Telephone Encounter (Signed)
New Message:       *STAT* If patient is at the pharmacy, call can be transferred to refill team.   1. Which medications need to be refilled? (please list name of each medication and dose if known) Evolocumab (REPATHA SURECLICK) 294 MG/ML SOAJ  2. Which pharmacy/location (including street and city if local pharmacy) is medication to be sent to? [id number] - [place], [place] - [address] AT [address]  3. Do they need a 30 day or 90 day supply? [place]

## 2018-04-15 ENCOUNTER — Other Ambulatory Visit: Payer: Self-pay

## 2018-04-15 MED ORDER — ALIROCUMAB 150 MG/ML ~~LOC~~ SOPN: 150.0000 mg | PEN_INJECTOR | SUBCUTANEOUS | 3 refills | Status: DC

## 2018-04-16 ENCOUNTER — Telehealth: Payer: Self-pay | Admitting: Pharmacist

## 2018-04-16 DIAGNOSIS — M25561 Pain in right knee: Secondary | ICD-10-CM | POA: Insufficient documentation

## 2018-04-16 DIAGNOSIS — M25562 Pain in left knee: Secondary | ICD-10-CM | POA: Diagnosis not present

## 2018-04-16 MED ORDER — EVOLOCUMAB 140 MG/ML ~~LOC~~ SOAJ: 140.0000 mg | SUBCUTANEOUS | 11 refills | Status: DC

## 2018-04-16 NOTE — Telephone Encounter (Signed)
Patient request refill for her Repatha. Per chart review her most recent PCSK9i was Praluent 150mg .   After talking to patient , she reports allergic reaction to Praluent and request refill for her Repatha.  I explain process of new PA needed for Repatha. Will call [facility] for new PA and send rx to pharmacy ASAP.  PA number [id number] Nov/13/2020.

## 2018-04-21 ENCOUNTER — Ambulatory Visit
Admission: RE | Admit: 2018-04-21 | Discharge: 2018-04-21 | Disposition: A | Discharge status: Home / Self Care | Payer: 59 | Source: Ambulatory Visit | Attending: Cardiology | Admitting: Cardiology

## 2018-04-21 DIAGNOSIS — R918 Other nonspecific abnormal finding of lung field: Secondary | ICD-10-CM | POA: Diagnosis not present

## 2018-04-25 ENCOUNTER — Other Ambulatory Visit: Payer: Self-pay | Admitting: Internal Medicine

## 2018-05-03 ENCOUNTER — Other Ambulatory Visit: Payer: Self-pay

## 2018-05-03 ENCOUNTER — Emergency Department (HOSPITAL_BASED_OUTPATIENT_CLINIC_OR_DEPARTMENT_OTHER)
Admission: EM | Admit: 2018-05-03 | Discharge: 2018-05-04 | Disposition: A | Discharge status: Home / Self Care | Payer: 59 | Attending: Emergency Medicine | Admitting: Emergency Medicine

## 2018-05-03 ENCOUNTER — Emergency Department (HOSPITAL_BASED_OUTPATIENT_CLINIC_OR_DEPARTMENT_OTHER): Payer: 59

## 2018-05-03 ENCOUNTER — Encounter (HOSPITAL_BASED_OUTPATIENT_CLINIC_OR_DEPARTMENT_OTHER): Payer: Self-pay | Admitting: Emergency Medicine

## 2018-05-03 DIAGNOSIS — R51 Headache: Secondary | ICD-10-CM | POA: Insufficient documentation

## 2018-05-03 DIAGNOSIS — I10 Essential (primary) hypertension: Secondary | ICD-10-CM | POA: Insufficient documentation

## 2018-05-03 DIAGNOSIS — Z79899 Other long term (current) drug therapy: Secondary | ICD-10-CM | POA: Diagnosis not present

## 2018-05-03 DIAGNOSIS — J45909 Unspecified asthma, uncomplicated: Secondary | ICD-10-CM | POA: Insufficient documentation

## 2018-05-03 DIAGNOSIS — R197 Diarrhea, unspecified: Secondary | ICD-10-CM

## 2018-05-03 DIAGNOSIS — R42 Dizziness and giddiness: Secondary | ICD-10-CM | POA: Diagnosis not present

## 2018-05-03 DIAGNOSIS — R110 Nausea: Secondary | ICD-10-CM | POA: Insufficient documentation

## 2018-05-03 DIAGNOSIS — Z8673 Personal history of transient ischemic attack (TIA), and cerebral infarction without residual deficits: Secondary | ICD-10-CM | POA: Diagnosis not present

## 2018-05-03 DIAGNOSIS — Z3202 Encounter for pregnancy test, result negative: Secondary | ICD-10-CM | POA: Insufficient documentation

## 2018-05-03 DIAGNOSIS — Z87891 Personal history of nicotine dependence: Secondary | ICD-10-CM | POA: Diagnosis not present

## 2018-05-03 LAB — TROPONIN I

## 2018-05-03 LAB — APTT: aPTT: 35 seconds (ref 24–36)

## 2018-05-03 LAB — CBC
HCT: 41.6 % (ref 36.0–46.0)
HEMOGLOBIN: 13.9 g/dL (ref 12.0–15.0)
MCH: 29.4 pg (ref 26.0–34.0)
MCHC: 33.4 g/dL (ref 30.0–36.0)
MCV: 87.9 fL (ref 78.0–100.0)
Platelets: 205 10*3/uL (ref 150–400)
RBC: 4.73 MIL/uL (ref 3.87–5.11)
RDW: 13.8 % (ref 11.5–15.5)
WBC: 6.2 10*3/uL (ref 4.0–10.5)

## 2018-05-03 LAB — CBG MONITORING, ED: GLUCOSE-CAPILLARY: 98 mg/dL (ref 70–99)

## 2018-05-03 LAB — COMPREHENSIVE METABOLIC PANEL
ALBUMIN: 4.3 g/dL (ref 3.5–5.0)
ALT: 32 U/L (ref 0–44)
AST: 25 U/L (ref 15–41)
Alkaline Phosphatase: 89 U/L (ref 38–126)
Anion gap: 11 (ref 5–15)
BUN: 19 mg/dL (ref 6–20)
CHLORIDE: 104 mmol/L (ref 98–111)
CO2: 26 mmol/L (ref 22–32)
CREATININE: 0.78 mg/dL (ref 0.44–1.00)
Calcium: 9.6 mg/dL (ref 8.9–10.3)
GFR calc Af Amer: >60 mL/min (ref >60)
GFR calc non Af Amer: >60 mL/min (ref >60)
GLUCOSE: 103 mg/dL — AB (ref 70–99)
POTASSIUM: 3.9 mmol/L (ref 3.5–5.1)
Sodium: 141 mmol/L (ref 135–145)
Total Bilirubin: 0.5 mg/dL (ref 0.3–1.2)
Total Protein: 7.5 g/dL (ref 6.5–8.1)

## 2018-05-03 LAB — ETHANOL: Alcohol, Ethyl (B): <10 mg/dL (ref <10)

## 2018-05-03 LAB — URINALYSIS, ROUTINE W REFLEX MICROSCOPIC
Bilirubin Urine: NEGATIVE
Glucose, UA: NEGATIVE mg/dL
HGB URINE DIPSTICK: NEGATIVE
KETONES UR: NEGATIVE mg/dL
Leukocytes, UA: NEGATIVE
Nitrite: NEGATIVE
PROTEIN: NEGATIVE mg/dL
Specific Gravity, Urine: 1.015 (ref 1.005–1.030)
pH: 6 (ref 5.0–8.0)

## 2018-05-03 LAB — RAPID URINE DRUG SCREEN, HOSP PERFORMED
Amphetamines: NOT DETECTED
BARBITURATES: NOT DETECTED
BENZODIAZEPINES: NOT DETECTED
Cocaine: NOT DETECTED
Opiates: NOT DETECTED
TETRAHYDROCANNABINOL: NOT DETECTED

## 2018-05-03 LAB — DIFFERENTIAL
BASOS PCT: 1 %
Basophils Absolute: 0.1 10*3/uL (ref 0.0–0.1)
EOS ABS: 0.3 10*3/uL (ref 0.0–0.7)
Eosinophils Relative: 5 %
LYMPHS ABS: 1.7 10*3/uL (ref 0.7–4.0)
Lymphocytes Relative: 27 %
Monocytes Absolute: 0.4 10*3/uL (ref 0.1–1.0)
Monocytes Relative: 7 %
NEUTROS PCT: 60 %
Neutro Abs: 3.8 10*3/uL (ref 1.7–7.7)

## 2018-05-03 LAB — PROTIME-INR
INR: 1
PROTHROMBIN TIME: 13.1 s (ref 11.4–15.2)

## 2018-05-03 LAB — PREGNANCY, URINE: PREG TEST UR: NEGATIVE

## 2018-05-03 MED ORDER — ONDANSETRON HCL 4 MG/2ML IJ SOLN
4.0000 mg | Freq: Once | INTRAMUSCULAR | Status: AC
  Administered 2018-05-03: 4 mg via INTRAVENOUS
  Filled 2018-05-03: qty 2

## 2018-05-03 MED ORDER — LORAZEPAM 2 MG/ML IJ SOLN
1.0000 mg | Freq: Once | INTRAMUSCULAR | Status: AC
  Administered 2018-05-03: 1 mg via INTRAVENOUS
  Filled 2018-05-03: qty 1

## 2018-05-03 MED ORDER — SODIUM CHLORIDE 0.9 % IV BOLUS
1000.0000 mL | Freq: Once | INTRAVENOUS | Status: AC
  Administered 2018-05-03: 1000 mL via INTRAVENOUS

## 2018-05-03 NOTE — ED Provider Notes (Signed)
[facility] EMERGENCY DEPARTMENT Provider Note   CSN: [id number] Arrival date & time: 05/03/18  1823     History   Chief Complaint Chief Complaint  Patient presents with  . Dizziness  . Headache    HPI [name] is a 62 y.o. female.  HPI  62 year old female comes in with chief complaint of dizziness and headaches. Patient has history of hypertension, hyperlipidemia and TIA.  She reports that she started having headache this morning when she woke up.  Despite taking Advil, she has had a constant pressure type headache on the left side of her head.  Additionally, after church around 1:30 PM she started having dizziness.  Dizziness is described as spinning sensation and swimmy headedness.  There is associated nausea as well.  Patient's symptoms are constant and there is no aggravating or relieving factors.  She denies any neck pain, tinnitus but husband does note that she has had some hearing issues lately.  Past Medical History:  Diagnosis Date  . Allergy   . Arthritis    neck, knees, ankles, feet, back  . Asthma    prn inhaler  . Dental crowns present   . Epigastric pain 06/11/2017  . Essential hypertension 06/16/2008   Qualifier: Diagnosis of  By: Ronnald Ramp MD, Arvid Right.   . Hallux valgus of right foot 05/2014  . History of TIA (transient ischemic attack) 2004  . Hyperglycemia 11/26/2007   Qualifier: Diagnosis of  By: Larose Kells MD, [place] Hyperlipidemia   . Hypertension   . Limited joint range of motion    neck - states has bone spurs C2-5  . Mild intermittent asthma 11/26/2007      . Morbid obesity ([place]) 03/19/2016  . TMJ syndrome     Patient Active Problem List   Diagnosis Date Noted  . Epigastric pain 06/11/2017  . Morbid obesity ([place]) 03/19/2016  . History of TIA (transient ischemic attack) 03/14/2015  . Routine general medical examination at a health care facility 11/23/2012  . Low back pain 11/02/2012  . Neuralgia 03/16/2012  . GERD 06/14/2009  .  Essential hypertension 06/16/2008  . Hyperlipidemia 12/29/2007  . Hyperglycemia 11/26/2007  . Mild intermittent asthma 11/26/2007    Past Surgical History:  Procedure Laterality Date  . ACROMIONECTOMY Left 08/18/2008  . APPENDECTOMY  1979  . BUNIONECTOMY Left 07/17/2007  . BUNIONECTOMY Right 05/26/2014   Procedure: RIGHT FOOT LAPIDUS BUNION CORRECTION AMD MODIFIED MCBRIDE BUNIONECTOMY;  Surgeon: Wylene Simmer, MD;  Location: [facility];  Service: Orthopedics;  Laterality: Right;  . CARDIAC CATHETERIZATION N/A 06/28/2016   Procedure: Left Heart Cath and Coronary Angiography;  Surgeon: Peter M Martinique, MD;  Location: [facility];  Service: Cardiovascular;  Laterality: N/A;  . CESAREAN SECTION  1986  . correction of bunionectomy  2016   right foot   . GANGLION CYST EXCISION     wrist  . NASAL SEPTUM SURGERY  1981  . ROTATOR CUFF REPAIR W/ DISTAL CLAVICLE EXCISION Left 08/18/2008  . SHOULDER ARTHROSCOPY Left 06/07/2008  . thumb surgery  07-28-2015   left thumb knuckle collasped and had surgery   . TONSILLECTOMY  1964  . TUBAL LIGATION  1999     OB History   None      Home Medications    Prior to Admission medications   Medication Sig Start Date End Date Taking? Authorizing Provider  albuterol (PROVENTIL HFA;VENTOLIN HFA) 108 (90 BASE) MCG/ACT inhaler Inhale 2 puffs into the  lungs every 4 (four) hours as needed for wheezing or shortness of breath. 10/15/14   Golden Circle, FNP  cetirizine (ZYRTEC) 10 MG tablet Take 10 mg by mouth at bedtime 06/10/17   Hoyt Koch, MD  Evolocumab (REPATHA SURECLICK) 287 MG/ML SOAJ Inject 140 mg into the skin every 14 (fourteen) days. 04/16/18   Lelon Perla, MD  fluticasone (FLONASE) 50 MCG/ACT nasal spray SHAKE Liquid and Use 2 sprays in each nostril twice a day 08/23/16   [provider]  fluticasone (FLOVENT HFA) 110 MCG/ACT inhaler Inhale 2 puffs into the lungs every 4 (four) hours as needed (for  shortness of breath).    [provider]  fluticasone furoate-vilanterol (BREO ELLIPTA) 200-25 MCG/INH AEPB Inhale 1 puff into the lungs daily. 09/23/16   Janith Lima, MD  montelukast (SINGULAIR) 10 MG tablet Take 1 tablet (10 mg total) at bedtime by mouth. 06/10/17   Hoyt Koch, MD  Nutritional Supplements (JUICE PLUS FIBRE PO) Take 4 each by mouth daily.    [provider]    Family History Family History  Problem Relation Age of Onset  . Cancer Mother        lung  . Diabetes Mother   . Thalassemia Mother   . Colon polyps Mother   . Cancer Brother        lymphatic  . Heart disease Paternal Aunt   . Atrial fibrillation Father   . Thalassemia Sister   . Thalassemia Maternal Aunt   . Colon cancer Neg Hx   . Rectal cancer Neg Hx   . Stomach cancer Neg Hx     Social History Social History   Tobacco Use  . Smoking status: Former Smoker    Last attempt to quit: 09/12/2005    Years since quitting: 12.6  . Smokeless tobacco: Never Used  Substance Use Topics  . Alcohol use: Yes    Alcohol/week: 0.0 standard drinks    Comment: occasionally  . Drug use: No     Allergies   Doxycycline; Hydrocodone-homatropine; Penicillins; Promethazine hcl; Clarithromycin; and Statins   Review of Systems Review of Systems  Constitutional: Positive for activity change.  Gastrointestinal: Positive for nausea.  Neurological: Positive for dizziness and headaches.  All other systems reviewed and are negative.    Physical Exam Updated Vital Signs BP 129/80   Pulse 71   Temp 98.9 F (37.2 C) (Oral)   Resp 17   Ht 5\' 3"  (1.6 m)   Wt 116.1 kg   SpO2 98%   BMI 45.34 kg/m   Physical Exam  Constitutional: She is oriented to person, place, and time. She appears well-developed.  HENT:  Head: Normocephalic and atraumatic.  Eyes: Pupils are equal, round, and reactive to light. Conjunctivae and EOM are normal. Left eye exhibits no nystagmus.  Neck: Normal range  of motion. Neck supple.  Cardiovascular: Normal rate, regular rhythm and normal heart sounds.  Pulmonary/Chest: Effort normal and breath sounds normal. No respiratory distress.  Abdominal: Soft. Bowel sounds are normal. She exhibits no distension. There is no tenderness. There is no rebound and no guarding.  Neurological: She is alert and oriented to person, place, and time. She has normal strength. She is not disoriented. No cranial nerve deficit or sensory deficit. GCS eye subscore is 4. GCS verbal subscore is 5. GCS motor subscore is 6.  Patient had ataxic gait upon ambulation  Skin: Skin is warm and dry.  Nursing note and vitals reviewed.  ED Treatments / Results  Labs (all labs ordered are listed, but only abnormal results are displayed) Labs Reviewed  COMPREHENSIVE METABOLIC PANEL - Abnormal; Notable for the following components:      Result Value   Glucose, Bld 103 (*)    All other components within normal limits  CBC  DIFFERENTIAL  TROPONIN I  ETHANOL  PROTIME-INR  APTT  RAPID URINE DRUG SCREEN, HOSP PERFORMED  URINALYSIS, ROUTINE W REFLEX MICROSCOPIC  PREGNANCY, URINE  CBG MONITORING, ED    EKG EKG Interpretation  Date/Time:  Sunday May 03 2018 18:35:22 EDT Ventricular Rate:  70 PR Interval:    QRS Duration: 97 QT Interval:  427 QTC Calculation: 461 R Axis:   -12 Text Interpretation:  Sinus rhythm No acute changes NORMAL INTERVALS AND AXIS Confirmed by Dena Esperanza ([id number]) on 05/03/2018 8:45:49 PM   Radiology Ct Head Wo Contrast  Result Date: 05/03/2018 CLINICAL DATA:  Acute headache, worst headache of life. Dizziness. No reported injury. EXAM: CT HEAD WITHOUT CONTRAST TECHNIQUE: Contiguous axial images were obtained from the base of the skull through the vertex without intravenous contrast. COMPARISON:  06/21/2003 head CT. FINDINGS: Brain: No evidence of parenchymal hemorrhage or extra-axial fluid collection. No mass lesion, mass effect, or midline  shift. No CT evidence of acute infarction. Cerebral volume is age appropriate. No ventriculomegaly. Vascular: No acute abnormality. Skull: No evidence of calvarial fracture. Sinuses/Orbits: The visualized paranasal sinuses are essentially clear. Other:  The mastoid air cells are unopacified. IMPRESSION: Negative head CT.  No evidence of acute intracranial abnormality. Electronically Signed   By: Jason A Poff M.D.   On: 05/03/2018 19:29    Procedures Procedures (including critical care time)  Medications Ordered in ED Medications  LORazepam (ATIVAN) injection 1 mg (1 mg Intravenous Given 05/03/18 1945)  ondansetron (ZOFRAN) injection 4 mg (4 mg Intravenous Given 05/03/18 1945)  sodium chloride 0.9 % bolus 1,000 mL (1,000 mLs Intravenous New Bag/Given 05/03/18 1943)     Initial Impression / Assessment and Plan / ED Course  I have reviewed the triage vital signs and the nursing notes.  Pertinent labs & imaging results that were available during my care of the patient were reviewed by me and considered in my medical decision making (see chart for details).     61  year old female with history of hypertension, hyperlipidemia, tia and obesity comes in with chief complaint of dizziness and headaches. What is concerning to [place] is that her dizziness, which is described as swimmy headedness and spinning sensation is constant and does not have any aggravating factors.  Clinical concerns are high for a posterior stroke.  CT scan of the head is negative for any acute findings. I spoke with Dr. Reather Converse, at [facility].  Patient will be transferred over to [facility] for MRI.  If the MRI is negative then patient can be discharged with treatment for vertigo and follow-up with the ENT.   Final Clinical Impressions(s) / ED Diagnoses   Final diagnoses:  Dizziness  Nausea  Stroke-like symptoms    ED Discharge Orders    None       Varney Biles, MD 05/03/18 2048

## 2018-05-03 NOTE — ED Provider Notes (Signed)
[facility] EMERGENCY DEPARTMENT Provider Note   CSN: [id number] Arrival date & time: 05/03/18  1823     History   Chief Complaint Chief Complaint  Patient presents with  . Dizziness  . Headache    HPI [name] is a 62 y.o. female.  Patient with history of high blood pressure, cholesterol, TIA, past smoking presents with intermittent vertigo and headache different than normal since this morning.  Patient sent over from outside smaller emergency room for further evaluation and MRI.  Patient has mild dizziness at this time.  Patient did have mild gait issues earlier today.     Past Medical History:  Diagnosis Date  . Allergy   . Arthritis    neck, knees, ankles, feet, back  . Asthma    prn inhaler  . Dental crowns present   . Epigastric pain 06/11/2017  . Essential hypertension 06/16/2008   Qualifier: Diagnosis of  By: Ronnald Ramp MD, [place] Right.   . Hallux valgus of right foot 05/2014  . History of TIA (transient ischemic attack) 2004  . Hyperglycemia 11/26/2007   Qualifier: Diagnosis of  By: Larose Kells MD, [place] Hyperlipidemia   . Hypertension   . Limited joint range of motion    neck - states has bone spurs C2-5  . Mild intermittent asthma 11/26/2007      . Morbid obesity ([place]) 03/19/2016  . TMJ syndrome     Patient Active Problem List   Diagnosis Date Noted  . Epigastric pain 06/11/2017  . Morbid obesity ([place]) 03/19/2016  . History of TIA (transient ischemic attack) 03/14/2015  . Routine general medical examination at a health care facility 11/23/2012  . Low back pain 11/02/2012  . Neuralgia 03/16/2012  . GERD 06/14/2009  . Essential hypertension 06/16/2008  . Hyperlipidemia 12/29/2007  . Hyperglycemia 11/26/2007  . Mild intermittent asthma 11/26/2007    Past Surgical History:  Procedure Laterality Date  . ACROMIONECTOMY Left 08/18/2008  . APPENDECTOMY  1979  . BUNIONECTOMY Left 07/17/2007  . BUNIONECTOMY Right 05/26/2014   Procedure:  RIGHT FOOT LAPIDUS BUNION CORRECTION AMD MODIFIED MCBRIDE BUNIONECTOMY;  Surgeon: Wylene Simmer, MD;  Location: [facility];  Service: Orthopedics;  Laterality: Right;  . CARDIAC CATHETERIZATION N/A 06/28/2016   Procedure: Left Heart Cath and Coronary Angiography;  Surgeon: Peter M Martinique, MD;  Location: [facility];  Service: Cardiovascular;  Laterality: N/A;  . CESAREAN SECTION  1986  . correction of bunionectomy  2016   right foot   . GANGLION CYST EXCISION     wrist  . NASAL SEPTUM SURGERY  1981  . ROTATOR CUFF REPAIR W/ DISTAL CLAVICLE EXCISION Left 08/18/2008  . SHOULDER ARTHROSCOPY Left 06/07/2008  . thumb surgery  07-28-2015   left thumb knuckle collasped and had surgery   . TONSILLECTOMY  1964  . TUBAL LIGATION  1999     OB History   None      Home Medications    Prior to Admission medications   Medication Sig Start Date End Date Taking? Authorizing Provider  albuterol (PROVENTIL HFA;VENTOLIN HFA) 108 (90 BASE) MCG/ACT inhaler Inhale 2 puffs into the lungs every 4 (four) hours as needed for wheezing or shortness of breath. 10/15/14   Golden Circle, FNP  cetirizine (ZYRTEC) 10 MG tablet Take 10 mg by mouth at bedtime 06/10/17   Hoyt Koch, MD  Evolocumab (REPATHA SURECLICK) 433 MG/ML SOAJ Inject 140 mg into the skin every 14 (fourteen)  days. 04/16/18   Lelon Perla, MD  fluticasone Asencion Islam) 50 MCG/ACT nasal spray SHAKE Liquid and Use 2 sprays in each nostril twice a day 08/23/16   [provider]  fluticasone (FLOVENT HFA) 110 MCG/ACT inhaler Inhale 2 puffs into the lungs every 4 (four) hours as needed (for shortness of breath).    [provider]  fluticasone furoate-vilanterol (BREO ELLIPTA) 200-25 MCG/INH AEPB Inhale 1 puff into the lungs daily. 09/23/16   Janith Lima, MD  montelukast (SINGULAIR) 10 MG tablet Take 1 tablet (10 mg total) at bedtime by mouth. 06/10/17   Hoyt Koch, MD  Nutritional Supplements  (JUICE PLUS FIBRE PO) Take 4 each by mouth daily.    [provider]    Family History Family History  Problem Relation Age of Onset  . Cancer Mother        lung  . Diabetes Mother   . Thalassemia Mother   . Colon polyps Mother   . Cancer Brother        lymphatic  . Heart disease Paternal Aunt   . Atrial fibrillation Father   . Thalassemia Sister   . Thalassemia Maternal Aunt   . Colon cancer Neg Hx   . Rectal cancer Neg Hx   . Stomach cancer Neg Hx     Social History Social History   Tobacco Use  . Smoking status: Former Smoker    Last attempt to quit: 09/12/2005    Years since quitting: 12.6  . Smokeless tobacco: Never Used  Substance Use Topics  . Alcohol use: Yes    Alcohol/week: 0.0 standard drinks    Comment: occasionally  . Drug use: No     Allergies   Doxycycline; Hydrocodone-homatropine; Penicillins; Promethazine hcl; Clarithromycin; and Statins   Review of Systems Review of Systems  Constitutional: Negative for chills and fever.  HENT: Negative for congestion.   Eyes: Negative for visual disturbance.  Respiratory: Negative for shortness of breath.   Cardiovascular: Negative for chest pain.  Gastrointestinal: Negative for abdominal pain and vomiting.  Genitourinary: Negative for dysuria and flank pain.  Musculoskeletal: Positive for gait problem. Negative for back pain, neck pain and neck stiffness.  Skin: Negative for rash.  Neurological: Positive for dizziness. Negative for weakness, light-headedness, numbness and headaches.     Physical Exam Updated Vital Signs BP (!) 141/80 (BP Location: Right Arm)   Pulse 66   Temp 97.9 F (36.6 C) (Oral)   Resp 18   Ht 5\' 3"  (1.6 m)   Wt 116.1 kg   SpO2 99%   BMI 45.34 kg/m   Physical Exam  Constitutional: She is oriented to person, place, and time. She appears well-developed and well-nourished.  HENT:  Head: Normocephalic and atraumatic.  Eyes: Conjunctivae are normal. Right eye exhibits  no discharge. Left eye exhibits no discharge.  Neck: Neck supple. No tracheal deviation present.  Cardiovascular: Normal rate and regular rhythm.  Pulmonary/Chest: Effort normal.  Abdominal: She exhibits no distension.  Musculoskeletal: She exhibits no edema.  Neurological: She is alert and oriented to person, place, and time. No cranial nerve deficit. GCS eye subscore is 4. GCS verbal subscore is 5. GCS motor subscore is 6.  5+ strength in UE and LE with f/e at major joints. Sensation to palpation intact in UE and LE. CNs 2-12 grossly intact.  EOMFI.  PERRL.   Finger nose and coordination intact bilateral.   Visual fields intact to finger testing. No nystagmus   Skin: Skin  is warm. No rash noted.  Psychiatric: She has a normal mood and affect.  Nursing note and vitals reviewed.    ED Treatments / Results  Labs (all labs ordered are listed, but only abnormal results are displayed) Labs Reviewed  COMPREHENSIVE METABOLIC PANEL - Abnormal; Notable for the following components:      Result Value   Glucose, Bld 103 (*)    All other components within normal limits  CBC  DIFFERENTIAL  TROPONIN I  ETHANOL  PROTIME-INR  APTT  RAPID URINE DRUG SCREEN, HOSP PERFORMED  URINALYSIS, ROUTINE W REFLEX MICROSCOPIC  PREGNANCY, URINE  CBG MONITORING, ED    EKG EKG Interpretation  Date/Time:  Sunday May 03 2018 18:35:22 EDT Ventricular Rate:  70 PR Interval:    QRS Duration: 97 QT Interval:  427 QTC Calculation: 461 R Axis:   -12 Text Interpretation:  Sinus rhythm No acute changes NORMAL INTERVALS AND AXIS Confirmed by Varney Biles [phone]) on 05/03/2018 8:45:49 PM   Radiology Ct Head Wo Contrast  Result Date: 05/03/2018 CLINICAL DATA:  Acute headache, worst headache of life. Dizziness. No reported injury. EXAM: CT HEAD WITHOUT CONTRAST TECHNIQUE: Contiguous axial images were obtained from the base of the skull through the vertex without intravenous contrast. COMPARISON:   06/21/2003 head CT. FINDINGS: Brain: No evidence of parenchymal hemorrhage or extra-axial fluid collection. No mass lesion, mass effect, or midline shift. No CT evidence of acute infarction. Cerebral volume is age appropriate. No ventriculomegaly. Vascular: No acute abnormality. Skull: No evidence of calvarial fracture. Sinuses/Orbits: The visualized paranasal sinuses are essentially clear. Other:  The mastoid air cells are unopacified. IMPRESSION: Negative head CT.  No evidence of acute intracranial abnormality. Electronically Signed   By: Ilona Sorrel M.D.   On: 05/03/2018 19:29    Procedures Procedures (including critical care time)  Medications Ordered in ED Medications  LORazepam (ATIVAN) injection 1 mg (1 mg Intravenous Given 05/03/18 1945)  ondansetron (ZOFRAN) injection 4 mg (4 mg Intravenous Given 05/03/18 1945)  sodium chloride 0.9 % bolus 1,000 mL (1,000 mLs Intravenous New Bag/Given 05/03/18 1943)     Initial Impression / Assessment and Plan / ED Course  I have reviewed the triage vital signs and the nursing notes.  Pertinent labs & imaging results that were available during my care of the patient were reviewed by me and considered in my medical decision making (see chart for details).    Patient presents for further work-up of possible posterior stroke.  Patient is out of any time window for TPA, no focal neuro deficits.  Plan for MRI of the brain, observation and if negative patient will follow-up with outpatient/supportive care and meclizine.  If MRI is positive neuro consult.  Patient's care will be transition to the night team.  Final Clinical Impressions(s) / ED Diagnoses   Final diagnoses:  Dizziness  Nausea  Stroke-like symptoms    ED Discharge Orders    None       Elnora Morrison, MD 05/03/18 2223

## 2018-05-03 NOTE — ED Triage Notes (Signed)
Patient states that she woke up this morning with a headache. About 3 hours ago she started to feel dizzy  - patient states that when she opens her eyes the pain gets worse

## 2018-05-03 NOTE — ED Notes (Signed)
[facility] notified Nunzio Cobbs) - transfer to [facility] - Dr. Adair Patter accepting for stroke work up

## 2018-05-03 NOTE — ED Provider Notes (Signed)
11:25 PM  Assumed care from Dr. Reather Converse.  Patient is a 62 year old female with history of previous TIA who presents to the emergency department with vertigo that started when she woke up this morning.  Work-up so far has been unremarkable.  Sent here for MRI of brain to rule out posterior stroke.  Symptoms mild currently.  2:15 AM  Pt complaining of worsening right-sided headache that started when she woke up with her dizziness.  No history of migraines.  CT showed no acute abnormality.  Will give migraine cocktail and reassess.  MRI pending.  4:50 AM  Pt's MRI shows no acute abnormality.  She states her headache has improved but still having nausea.  Will give dose of IV Zofran.  Dizziness is also improved.  She is able to ambulate without difficulty.  She is now however having diarrhea.  No abdominal pain.  States she is ready for discharge home.  Will discharge with prescriptions of meclizine, Zofran, Imodium.  Recommended bland diet for the next several days.  Suspect viral illness, peripheral vertigo.   At this time, I do not feel there is any life-threatening condition present. I have reviewed and discussed all results (EKG, imaging, lab, urine as appropriate) and exam findings with patient/family. I have reviewed nursing notes and appropriate previous records.  I feel the patient is safe to be discharged home without further emergent workup and can continue workup as an outpatient as needed. Discussed usual and customary return precautions. Patient/family verbalize understanding and are comfortable with this plan.  Outpatient follow-up has been provided if needed. All questions have been answered.    Ward, Delice Bison, DO 05/04/18 [phone]

## 2018-05-03 NOTE — ED Notes (Signed)
RN met w/ pt, came from [facility] for an MRI. Pt upset that she was not transferred directly to MRI as she "was told I would be."  RN explained that the pt would first been seen by our provider and he would place the orders.  RN informed the provider.   

## 2018-05-04 ENCOUNTER — Emergency Department (HOSPITAL_COMMUNITY): Payer: 59

## 2018-05-04 ENCOUNTER — Encounter (HOSPITAL_COMMUNITY): Payer: Self-pay | Admitting: Radiology

## 2018-05-04 DIAGNOSIS — R42 Dizziness and giddiness: Secondary | ICD-10-CM | POA: Diagnosis not present

## 2018-05-04 MED ORDER — KETOROLAC TROMETHAMINE 30 MG/ML IJ SOLN
30.0000 mg | Freq: Once | INTRAMUSCULAR | Status: AC
  Administered 2018-05-04: 30 mg via INTRAVENOUS
  Filled 2018-05-04: qty 1

## 2018-05-04 MED ORDER — ONDANSETRON 4 MG PO TBDP: 4.0000 mg | ORAL_TABLET | Freq: Four times a day (QID) | ORAL | 0 refills | Status: DC | PRN

## 2018-05-04 MED ORDER — ONDANSETRON HCL 4 MG/2ML IJ SOLN
4.0000 mg | Freq: Once | INTRAMUSCULAR | Status: AC
  Administered 2018-05-04: 4 mg via INTRAVENOUS
  Filled 2018-05-04: qty 2

## 2018-05-04 MED ORDER — LOPERAMIDE HCL 2 MG PO CAPS: 2.0000 mg | ORAL_CAPSULE | Freq: Four times a day (QID) | ORAL | 0 refills | Status: DC | PRN

## 2018-05-04 MED ORDER — DIPHENHYDRAMINE HCL 50 MG/ML IJ SOLN
25.0000 mg | Freq: Once | INTRAMUSCULAR | Status: AC
  Administered 2018-05-04: 25 mg via INTRAVENOUS
  Filled 2018-05-04: qty 1

## 2018-05-04 MED ORDER — LOPERAMIDE HCL 2 MG PO CAPS
2.0000 mg | ORAL_CAPSULE | Freq: Once | ORAL | Status: AC
  Administered 2018-05-04: 2 mg via ORAL
  Filled 2018-05-04: qty 1

## 2018-05-04 MED ORDER — METOCLOPRAMIDE HCL 5 MG/ML IJ SOLN
10.0000 mg | Freq: Once | INTRAMUSCULAR | Status: AC
  Administered 2018-05-04: 10 mg via INTRAVENOUS
  Filled 2018-05-04: qty 2

## 2018-05-04 MED ORDER — MECLIZINE HCL 25 MG PO TABS: 25.0000 mg | ORAL_TABLET | Freq: Three times a day (TID) | ORAL | 0 refills | Status: DC | PRN

## 2018-05-04 NOTE — ED Notes (Signed)
Patient transported to MRI 

## 2018-05-05 ENCOUNTER — Other Ambulatory Visit (HOSPITAL_COMMUNITY): Payer: Self-pay | Admitting: Gastroenterology

## 2018-05-05 DIAGNOSIS — K5730 Diverticulosis of large intestine without perforation or abscess without bleeding: Secondary | ICD-10-CM | POA: Diagnosis not present

## 2018-05-05 DIAGNOSIS — R1033 Periumbilical pain: Secondary | ICD-10-CM | POA: Diagnosis not present

## 2018-05-05 DIAGNOSIS — R1032 Left lower quadrant pain: Secondary | ICD-10-CM

## 2018-05-05 DIAGNOSIS — K625 Hemorrhage of anus and rectum: Secondary | ICD-10-CM | POA: Diagnosis not present

## 2018-05-05 DIAGNOSIS — E119 Type 2 diabetes mellitus without complications: Secondary | ICD-10-CM | POA: Diagnosis not present

## 2018-05-06 ENCOUNTER — Encounter (HOSPITAL_COMMUNITY): Payer: Self-pay

## 2018-05-06 ENCOUNTER — Ambulatory Visit (HOSPITAL_COMMUNITY)
Admission: RE | Admit: 2018-05-06 | Discharge: 2018-05-06 | Disposition: A | Discharge status: Home / Self Care | Payer: 59 | Source: Ambulatory Visit | Attending: Gastroenterology | Admitting: Gastroenterology

## 2018-05-06 DIAGNOSIS — A09 Infectious gastroenteritis and colitis, unspecified: Secondary | ICD-10-CM | POA: Diagnosis not present

## 2018-05-06 DIAGNOSIS — R1032 Left lower quadrant pain: Secondary | ICD-10-CM

## 2018-05-06 DIAGNOSIS — K625 Hemorrhage of anus and rectum: Secondary | ICD-10-CM | POA: Diagnosis not present

## 2018-05-06 DIAGNOSIS — K5790 Diverticulosis of intestine, part unspecified, without perforation or abscess without bleeding: Secondary | ICD-10-CM | POA: Insufficient documentation

## 2018-05-06 DIAGNOSIS — K760 Fatty (change of) liver, not elsewhere classified: Secondary | ICD-10-CM | POA: Insufficient documentation

## 2018-05-06 MED ORDER — IOHEXOL 300 MG/ML  SOLN
100.0000 mL | Freq: Once | INTRAMUSCULAR | Status: AC | PRN
  Administered 2018-05-06: 100 mL via INTRAVENOUS

## 2018-05-06 MED ORDER — SODIUM CHLORIDE 0.9 % IJ SOLN
INTRAMUSCULAR | Status: AC
  Filled 2018-05-06: qty 50

## 2018-05-07 ENCOUNTER — Ambulatory Visit: Payer: 59 | Admitting: Gastroenterology

## 2018-05-12 DIAGNOSIS — Z86010 Personal history of colonic polyps: Secondary | ICD-10-CM | POA: Diagnosis not present

## 2018-05-12 DIAGNOSIS — K5730 Diverticulosis of large intestine without perforation or abscess without bleeding: Secondary | ICD-10-CM | POA: Diagnosis not present

## 2018-05-12 DIAGNOSIS — K760 Fatty (change of) liver, not elsewhere classified: Secondary | ICD-10-CM | POA: Diagnosis not present

## 2018-05-15 ENCOUNTER — Ambulatory Visit: Payer: 59 | Admitting: Internal Medicine

## 2018-05-15 ENCOUNTER — Encounter: Payer: Self-pay | Admitting: Internal Medicine

## 2018-05-15 VITALS — BP 120/80 | HR 88 | Temp 98.6°F | Ht 63.0 in | Wt 254.0 lb

## 2018-05-15 DIAGNOSIS — K5792 Diverticulitis of intestine, part unspecified, without perforation or abscess without bleeding: Secondary | ICD-10-CM

## 2018-05-15 DIAGNOSIS — Z0000 Encounter for general adult medical examination without abnormal findings: Secondary | ICD-10-CM | POA: Diagnosis not present

## 2018-05-15 DIAGNOSIS — J4520 Mild intermittent asthma, uncomplicated: Secondary | ICD-10-CM

## 2018-05-15 MED ORDER — MONTELUKAST SODIUM 10 MG PO TABS: 10.0000 mg | ORAL_TABLET | Freq: Every day | ORAL | 3 refills | Status: DC

## 2018-05-15 NOTE — Assessment & Plan Note (Signed)
Taking cipro and flagyl and overall sounds to be improving gradually. She is seeing Dr. Collene Mares and will get notes about this.

## 2018-05-15 NOTE — Assessment & Plan Note (Signed)
Refill singulair, she denies wanting change to regimen at this time.

## 2018-05-15 NOTE — Progress Notes (Signed)
   Subjective:    Patient ID: [name], female    DOB: Mar 16, 1956, 62 y.o.   MRN: [id number]  HPI The patient is a 62 YO female coming in for follow up asthma and allergies. She is out of singulair and is using albuterol more often. She is taking zyrtec over the counter and this is not doing as well. She is using flovent some as well. Denies fevers or chills. Some cough and SOB. Mild wheezing which is helped by albuterol.  She is also having GI problems with diverticulitis and went to Dr. Collene Mares to get evaluated after very terrible ER visit and is on cipro and flagyl for this. Bleeding stopped and still some diarrhea. Still some bloating and abdominal pain. Poor appetite.   Review of Systems  Constitutional: Positive for activity change and appetite change. Negative for chills, fatigue, fever and unexpected weight change.  HENT: Positive for congestion, postnasal drip and rhinorrhea. Negative for ear discharge, ear pain, sinus pressure, sinus pain, sneezing, sore throat, tinnitus, trouble swallowing and voice change.   Eyes: Negative.   Respiratory: Positive for cough, shortness of breath and wheezing. Negative for chest tightness.   Cardiovascular: Negative.   Gastrointestinal: Positive for abdominal distention, abdominal pain, diarrhea and nausea. Negative for constipation and vomiting.  Musculoskeletal: Positive for myalgias.  Neurological: Negative.       Objective:   Physical Exam  Constitutional: She is oriented to person, place, and time. She appears well-developed and well-nourished.  HENT:  Head: Normocephalic and atraumatic.  Oropharynx with redness and clear drainage, nose with swollen turbinates, TMs normal bilaterally  Eyes: EOM are normal.  Neck: Normal range of motion. No thyromegaly present.  Cardiovascular: Normal rate and regular rhythm.  Pulmonary/Chest: Effort normal and breath sounds normal. No respiratory distress. She has no wheezes. She has no rales.    Abdominal: Soft. Bowel sounds are normal. She exhibits distension. There is tenderness. There is no rebound.  Musculoskeletal: She exhibits no edema or tenderness.  Lymphadenopathy:    She has no cervical adenopathy.  Neurological: She is alert and oriented to person, place, and time. Coordination normal.  Skin: Skin is warm and dry.   Vitals:   05/15/18 0934  BP: 120/80  Pulse: 88  Temp: 98.6 F (37 C)  TempSrc: Oral  SpO2: 96%  Weight: 254 lb (115.2 kg)  Height: 5\' 3"  (1.6 m)      Assessment & Plan:

## 2018-05-15 NOTE — Patient Instructions (Addendum)
We have given you the singulair to the pharmacy.

## 2018-06-05 DIAGNOSIS — E119 Type 2 diabetes mellitus without complications: Secondary | ICD-10-CM | POA: Diagnosis not present

## 2018-07-05 DIAGNOSIS — E119 Type 2 diabetes mellitus without complications: Secondary | ICD-10-CM | POA: Diagnosis not present

## 2018-07-07 ENCOUNTER — Ambulatory Visit: Payer: 59 | Admitting: Internal Medicine

## 2018-07-08 ENCOUNTER — Ambulatory Visit: Payer: 59 | Admitting: Internal Medicine

## 2018-07-08 ENCOUNTER — Encounter: Payer: Self-pay | Admitting: Internal Medicine

## 2018-07-08 ENCOUNTER — Other Ambulatory Visit: Payer: Self-pay | Admitting: Internal Medicine

## 2018-07-08 DIAGNOSIS — F4321 Adjustment disorder with depressed mood: Secondary | ICD-10-CM | POA: Insufficient documentation

## 2018-07-08 DIAGNOSIS — Z634 Disappearance and death of family member: Secondary | ICD-10-CM

## 2018-07-08 DIAGNOSIS — F4381 Prolonged grief disorder: Secondary | ICD-10-CM | POA: Insufficient documentation

## 2018-07-08 DIAGNOSIS — F4329 Adjustment disorder with other symptoms: Secondary | ICD-10-CM | POA: Diagnosis not present

## 2018-07-08 MED ORDER — TEMAZEPAM 7.5 MG PO CAPS: 7.5000 mg | ORAL_CAPSULE | Freq: Every evening | ORAL | 0 refills | Status: DC | PRN

## 2018-07-08 NOTE — Progress Notes (Signed)
   Subjective:    Patient ID: [name], female    DOB: 05-Dec-1955, 62 y.o.   MRN: [id number]  HPI The patient is a 62 YO female coming in for concerns about anxiety. Her father recently passed away and this is really bothering her. She is having many difficulties sleeping. She is waking after 1-2 hours sleeping and cannot get back to sleep. She is taking walks at night time. Her focus is terrible during the day. Her husband is worried about her. Has tried nothing. Denies SI/HI. Denies depression. She is eating okay but poor appetite. She is down about 10-15 pounds in the last month or two. Overall it is worsening. She is still working but is having difficulties with focus at work. She is very tired all the time.   Review of Systems  Constitutional: Negative.   Respiratory: Negative for cough, chest tightness and shortness of breath.   Cardiovascular: Negative for chest pain, palpitations and leg swelling.  Gastrointestinal: Negative for abdominal distention, abdominal pain, constipation, diarrhea, nausea and vomiting.  Musculoskeletal: Negative.   Skin: Negative.   Neurological: Negative.   Psychiatric/Behavioral: Positive for agitation, decreased concentration, dysphoric mood and sleep disturbance. Negative for behavioral problems, confusion, hallucinations, self-injury and suicidal ideas. The patient is not nervous/anxious.       Objective:   Physical Exam  Constitutional: She is oriented to person, place, and time. She appears well-developed and well-nourished.  HENT:  Head: Normocephalic and atraumatic.  Eyes: EOM are normal.  Neck: Normal range of motion.  Cardiovascular: Normal rate and regular rhythm.  Pulmonary/Chest: Effort normal and breath sounds normal. No respiratory distress. She has no wheezes. She has no rales.  Musculoskeletal: She exhibits no edema.  Neurological: She is alert and oriented to person, place, and time. Coordination normal.  Skin: Skin is warm and  dry.  Psychiatric:  Some anxious and emotional during the visit   Vitals:   07/08/18 0930  BP: 122/88  Pulse: 87  Temp: 98.3 F (36.8 C)  TempSrc: Oral  SpO2: 97%  Weight: 251 lb (113.9 kg)  Height: 5\' 3"  (1.6 m)      Assessment & Plan:

## 2018-07-08 NOTE — Patient Instructions (Signed)
We have sent in temazepam to help you sleep. Take 1 pill at night time and if not working you can take 2 pills at night time.   Think about doing some of the grief counseling through hospice.

## 2018-07-08 NOTE — Assessment & Plan Note (Signed)
Encouraged counseling to help. Rx for temazepam for sleep as she thinks that this will help her mental state. Does not feel depressed and does not want depression medication. She denies SI/HI.

## 2018-07-21 DIAGNOSIS — R1032 Left lower quadrant pain: Secondary | ICD-10-CM | POA: Diagnosis not present

## 2018-07-21 DIAGNOSIS — K5900 Constipation, unspecified: Secondary | ICD-10-CM | POA: Diagnosis not present

## 2018-07-21 DIAGNOSIS — R194 Change in bowel habit: Secondary | ICD-10-CM | POA: Diagnosis not present

## 2018-08-05 DIAGNOSIS — E119 Type 2 diabetes mellitus without complications: Secondary | ICD-10-CM | POA: Diagnosis not present

## 2018-08-06 ENCOUNTER — Other Ambulatory Visit: Payer: Self-pay | Admitting: Internal Medicine

## 2018-08-27 ENCOUNTER — Encounter: Payer: Self-pay | Admitting: Internal Medicine

## 2018-08-27 DIAGNOSIS — N6311 Unspecified lump in the right breast, upper outer quadrant: Secondary | ICD-10-CM | POA: Diagnosis not present

## 2018-08-27 DIAGNOSIS — N6001 Solitary cyst of right breast: Secondary | ICD-10-CM | POA: Diagnosis not present

## 2018-08-27 DIAGNOSIS — N6041 Mammary duct ectasia of right breast: Secondary | ICD-10-CM | POA: Diagnosis not present

## 2018-08-27 DIAGNOSIS — N644 Mastodynia: Secondary | ICD-10-CM | POA: Diagnosis not present

## 2018-08-27 DIAGNOSIS — N6011 Diffuse cystic mastopathy of right breast: Secondary | ICD-10-CM | POA: Diagnosis not present

## 2018-08-27 LAB — HM MAMMOGRAPHY

## 2018-08-27 NOTE — Progress Notes (Signed)
Abstracted and sent to scan  

## 2018-09-05 DIAGNOSIS — E119 Type 2 diabetes mellitus without complications: Secondary | ICD-10-CM | POA: Diagnosis not present

## 2018-09-21 DIAGNOSIS — H2513 Age-related nuclear cataract, bilateral: Secondary | ICD-10-CM | POA: Diagnosis not present

## 2018-09-21 DIAGNOSIS — E119 Type 2 diabetes mellitus without complications: Secondary | ICD-10-CM | POA: Diagnosis not present

## 2018-09-21 LAB — HM DIABETES EYE EXAM

## 2018-09-23 ENCOUNTER — Encounter: Payer: Self-pay | Admitting: Internal Medicine

## 2018-10-04 DIAGNOSIS — E119 Type 2 diabetes mellitus without complications: Secondary | ICD-10-CM | POA: Diagnosis not present

## 2018-11-24 ENCOUNTER — Encounter: Payer: Self-pay | Admitting: Internal Medicine

## 2018-11-24 ENCOUNTER — Ambulatory Visit (INDEPENDENT_AMBULATORY_CARE_PROVIDER_SITE_OTHER): Payer: 59 | Admitting: Internal Medicine

## 2018-11-24 ENCOUNTER — Ambulatory Visit: Payer: Self-pay | Admitting: *Deleted

## 2018-11-24 DIAGNOSIS — Z20822 Contact with and (suspected) exposure to covid-19: Secondary | ICD-10-CM

## 2018-11-24 DIAGNOSIS — R6889 Other general symptoms and signs: Secondary | ICD-10-CM

## 2018-11-24 MED ORDER — ALBUTEROL SULFATE HFA 108 (90 BASE) MCG/ACT IN AERS: 2.0000 | INHALATION_SPRAY | RESPIRATORY_TRACT | 1 refills | Status: DC | PRN

## 2018-11-24 NOTE — Progress Notes (Signed)
Virtual Visit via Video Note  I connected with [name] on 11/24/18 at  9:00 AM EDT by a video enabled telemedicine application and verified that I am speaking with the correct person using two identifiers.   I discussed the limitations of evaluation and management by telemedicine and the availability of in person appointments. The patient expressed understanding and agreed to proceed.  History of Present Illness: The patient is a 63 y.o. female with visit for fevers/chills and headaches and chest tightness. She does have concurrent asthma. She has not been taking her breo at all recently. Usually this time of year is when she might have problems with breathing. Started Saturday with a headache. Starting Sunday she felt fevers and chills. Took temp and had "low grade" temp. She took tylenol which helped the fevers and chills but not the headache. She has been sleeping most of the time since then. She went to work on Monday with these symptoms but had to leave by 11 am due to being so fatigued and feeling so poorly. Has overall been worsening and has chest tightness. She is using albuterol multiple times per day with some relief. Is still taking her allergy medication and does not notice significant difference in symptoms of allergies. Denies diarrhea or vomiting. Appetite is poor. Overall it is worsening. Has tried tylenol and albuterol.   Observations/Objective: Appearance: ill, breathing appears normal and speaking in full sentences, appears sick, casual grooming, abdomen does not appear distended, throat red, mental status is A and O times 3  Assessment and Plan: See problem oriented charting  Follow Up Instructions: refill albuterol, start breo, coronavirus mychart monitoring prescribed  I discussed the assessment and treatment plan with the patient. The patient was provided an opportunity to ask questions and all were answered. The patient agreed with the plan and demonstrated an  understanding of the instructions.   The patient was advised to call back or seek an in-person evaluation if the symptoms worsen or if the condition fails to improve as anticipated.  Hoyt Koch, MD   Visit time 25 minutes: greater than 50% of that time was spent in face to face counseling and coordination of care with the patient: counseled about coronavirus and her concurrent breathing problems and her risk of complications

## 2018-11-24 NOTE — Assessment & Plan Note (Signed)
Prescribed mychart monitoring to assess symptoms. She is high risk for complications to this. Advised to start breo today and refilled albuterol inhaler. Talked about need to seek care at [facility] for worsening SOB or breathing problems and that breathing problems can change rapidly with coronavirus. Note given to her on what she needs to qualify to go back to work. Advised to self quarantine until these guidelines are met and not to return to work. She is not to leave the house and to quarantine the household if at all possible.

## 2018-11-24 NOTE — Telephone Encounter (Signed)
noted 

## 2018-11-24 NOTE — Telephone Encounter (Addendum)
Pt called the COVID line; the pt states that for the past 2 days  she has the following symptoms: it feels like someone is sitting on her chest, headache rated 5 out of 10;  weakness, fatigue, chills,  and intermittent sore throat, her temp pm 11/23/2018 was 100.9; she  answers "no" to all questions on the [place]; nurse triage initiated and recommendations made per protocol; the pt has asthma, but not diabetes or hypertension; conference call initiated with Brittney; per Brittney, pt offered and accepted virtual visit with Dr Pricilla Holm, [facility], 11/24/2018 at 0900; the pt verbalized understanding; will route to office for notification.  Reason for Disposition . MILD difficulty breathing (e.g., minimal/no SOB at rest, SOB with walking, pulse <100)  Answer Assessment - Initial Assessment Questions 1. COVID-19 DIAGNOSIS: "Who made your Coronavirus (COVID-19) diagnosis?" "Was it confirmed by a positive lab test?" If not diagnosed by a HCP, ask "Are there lots of cases (community spread) where you live?" (See public health department website, if unsure)   * MAJOR community spread: high number of cases; numbers of cases are increasing; many people hospitalized.   * MINOR community spread: low number of cases; not increasing; few or no people hospitalized     majpr 2. ONSET: "When did the COVID-19 symptoms start?"      11/22/2018 3. WORST SYMPTOM: "What is your worst symptom?" (e.g., cough, fever, shortness of breath, muscle aches)     Chest pressure 4. COUGH: "How bad is the cough?"      no 5. FEVER: "Do you have a fever?" If so, ask: "What is your temperature, how was it measured, and when did it start?"     100.9 pm 11/23/2018 6. RESPIRATORY STATUS: "Describe your breathing?" (e.g., shortness of breath, wheezing, unable to speak)      Shortness of breath 7. BETTER-SAME-WORSE: "Are you getting better, staying the same or getting worse compared to yesterday?"  If getting worse,  ask, "In what way?"     The same 8. HIGH RISK DISEASE: "Do you have any chronic medical problems?" (e.g., asthma, heart or lung disease, weak immune system, etc.)     Asthma, hypertension, diabetes 9. PREGNANCY: "Is there any chance you are pregnant?" "When was your last menstrual period?"     no 10. OTHER SYMPTOMS: "Do you have any other symptoms?"  (e.g., runny nose, headache, sore throat, loss of smell)    Headache, sore throat, weakness, fatigue, chills  Protocols used: CORONAVIRUS (COVID-19) DIAGNOSED OR SUSPECTED-A-AH

## 2018-11-25 ENCOUNTER — Telehealth: Payer: Self-pay | Admitting: Internal Medicine

## 2018-11-25 NOTE — Telephone Encounter (Signed)
She should log onto mychart to do this.

## 2018-11-25 NOTE — Telephone Encounter (Signed)
Copied from [place] [id number]. Topic: Quick Communication - See Telephone Encounter >> Nov 25, 2018  2:52 PM Reyne Dumas L wrote: CRM for notification. See Telephone encounter for: 11/25/18.  Pt called and states she was supposed to report her temperature this morning.  States it was 99.6

## 2018-11-26 ENCOUNTER — Encounter (INDEPENDENT_AMBULATORY_CARE_PROVIDER_SITE_OTHER): Payer: Self-pay

## 2018-11-26 ENCOUNTER — Telehealth: Payer: Self-pay | Admitting: Internal Medicine

## 2018-11-26 ENCOUNTER — Telehealth: Payer: Self-pay

## 2018-11-26 NOTE — Telephone Encounter (Signed)
Noted  

## 2018-11-26 NOTE — Telephone Encounter (Signed)
Called and spoke with pt regarding MyChart COVID19 Condition Monitoring. Pt indicates she is in MyChart now and will look for the daily questionnaire to complete. She will be sending a msg to Dr. Sharlet Salina as well.

## 2018-11-26 NOTE — Telephone Encounter (Signed)
Pt reported having diarrhea, and loss of appetite inform pt to  encourage patient to drink fluids as tolerated, work their way up to bland solid food such as crackers, pretzels, soup, bread or applesauce and boiled starches.  If diarrhea remains the same: encourage patient to drink oral fluids and bland foods.   Avoid alcohol, spicy foods, caffeine or fatty foods that could make diarrhea worse.   Continue to monitor for signs of dehydration (increased thirst decreased urine output, yellow urine, dry skin, headache or dizziness).   Advise patient to try OTC medication (Imodium, kaopectate, Pepto-Bismo.

## 2018-11-27 ENCOUNTER — Telehealth: Payer: Self-pay | Admitting: *Deleted

## 2018-11-27 ENCOUNTER — Encounter (INDEPENDENT_AMBULATORY_CARE_PROVIDER_SITE_OTHER): Payer: Self-pay

## 2018-11-27 ENCOUNTER — Telehealth: Payer: Self-pay | Admitting: Internal Medicine

## 2018-11-27 NOTE — Telephone Encounter (Signed)
Called and spoke to pt. Thanked her for completing survey. No questions at this time.

## 2018-11-27 NOTE — Telephone Encounter (Signed)
Attempted to contact pt in response to my chart companion reporting worsening weakness; left message on voicemail.

## 2018-11-27 NOTE — Telephone Encounter (Signed)
Noted patient is in contact through mychart about symptoms

## 2018-11-27 NOTE — Telephone Encounter (Signed)
Contacted pt regarding her symptoms; she states that she has already spoken to [place] regarding this; will route to office for notification.

## 2018-11-28 ENCOUNTER — Encounter (INDEPENDENT_AMBULATORY_CARE_PROVIDER_SITE_OTHER): Payer: Self-pay

## 2018-11-29 ENCOUNTER — Encounter (INDEPENDENT_AMBULATORY_CARE_PROVIDER_SITE_OTHER): Payer: Self-pay

## 2018-11-30 ENCOUNTER — Encounter (INDEPENDENT_AMBULATORY_CARE_PROVIDER_SITE_OTHER): Payer: Self-pay

## 2018-12-01 ENCOUNTER — Telehealth: Payer: Self-pay | Admitting: *Deleted

## 2018-12-01 ENCOUNTER — Encounter (INDEPENDENT_AMBULATORY_CARE_PROVIDER_SITE_OTHER): Payer: Self-pay

## 2018-12-01 ENCOUNTER — Encounter: Payer: Self-pay | Admitting: Internal Medicine

## 2018-12-01 NOTE — Telephone Encounter (Signed)
Patient called after receiving BPA for coughing as a new symptom via [place]. Pt states she has experienced coughing before but it went away for 2 days and now it is back. Pt states she is experiencing a dry cough but denies having coughing spells and denies that the coughing is keeping her up at night. Pt asked if anything makes coughing worse and pt states that talking makes her coughing worse. Pt states she has not tried any over the counter medicines at this time. Pt given home care advice to try over the counter medicines for cough and try using cough drops, hard candy or drinking warm fluids.Pt advised that if coughing became worse or no change in symptoms with the use of over the counter medications to contact PCP to possibly be scheduled for appt. Pt verbalized understanding and call was ended.

## 2018-12-02 ENCOUNTER — Encounter (INDEPENDENT_AMBULATORY_CARE_PROVIDER_SITE_OTHER): Payer: Self-pay

## 2018-12-03 ENCOUNTER — Encounter (INDEPENDENT_AMBULATORY_CARE_PROVIDER_SITE_OTHER): Payer: Self-pay

## 2018-12-04 ENCOUNTER — Encounter (INDEPENDENT_AMBULATORY_CARE_PROVIDER_SITE_OTHER): Payer: Self-pay

## 2018-12-05 ENCOUNTER — Encounter (INDEPENDENT_AMBULATORY_CARE_PROVIDER_SITE_OTHER): Payer: Self-pay

## 2018-12-06 ENCOUNTER — Encounter (INDEPENDENT_AMBULATORY_CARE_PROVIDER_SITE_OTHER): Payer: Self-pay

## 2018-12-07 ENCOUNTER — Encounter (INDEPENDENT_AMBULATORY_CARE_PROVIDER_SITE_OTHER): Payer: Self-pay

## 2018-12-08 ENCOUNTER — Encounter: Payer: Self-pay | Admitting: Internal Medicine

## 2019-03-01 ENCOUNTER — Other Ambulatory Visit (INDEPENDENT_AMBULATORY_CARE_PROVIDER_SITE_OTHER): Payer: 59

## 2019-03-01 DIAGNOSIS — Z0000 Encounter for general adult medical examination without abnormal findings: Secondary | ICD-10-CM

## 2019-03-01 LAB — LDL CHOLESTEROL, DIRECT: Direct LDL: 78 mg/dL

## 2019-03-01 LAB — CBC
HCT: 44.3 % (ref 36.0–46.0)
Hemoglobin: 14.7 g/dL (ref 12.0–15.0)
MCHC: 33.1 g/dL (ref 30.0–36.0)
MCV: 87.9 fl (ref 78.0–100.0)
Platelets: 227 10*3/uL (ref 150.0–400.0)
RBC: 5.03 Mil/uL (ref 3.87–5.11)
RDW: 14 % (ref 11.5–15.5)
WBC: 7.5 10*3/uL (ref 4.0–10.5)

## 2019-03-01 LAB — LIPID PANEL
Cholesterol: 153 mg/dL (ref 0–200)
HDL: 41.5 mg/dL (ref >39.00)
NonHDL: 111.19
Total CHOL/HDL Ratio: 4
Triglycerides: 342 mg/dL — ABNORMAL HIGH (ref 0.0–149.0)
VLDL: 68.4 mg/dL — ABNORMAL HIGH (ref 0.0–40.0)

## 2019-03-01 LAB — COMPREHENSIVE METABOLIC PANEL
ALT: 25 U/L (ref 0–35)
AST: 19 U/L (ref 0–37)
Albumin: 4.8 g/dL (ref 3.5–5.2)
Alkaline Phosphatase: 95 U/L (ref 39–117)
BUN: 18 mg/dL (ref 6–23)
CO2: 29 mEq/L (ref 19–32)
Calcium: 10.3 mg/dL (ref 8.4–10.5)
Chloride: 101 mEq/L (ref 96–112)
Creatinine, Ser: 0.93 mg/dL (ref 0.40–1.20)
GFR: 61.33 mL/min (ref >60.00)
Glucose, Bld: 105 mg/dL — ABNORMAL HIGH (ref 70–99)
Potassium: 4.6 mEq/L (ref 3.5–5.1)
Sodium: 140 mEq/L (ref 135–145)
Total Bilirubin: 0.4 mg/dL (ref 0.2–1.2)
Total Protein: 7.8 g/dL (ref 6.0–8.3)

## 2019-03-01 LAB — HEMOGLOBIN A1C: Hgb A1c MFr Bld: 6 % (ref 4.6–6.5)

## 2019-03-01 LAB — FERRITIN: Ferritin: 175.4 ng/mL (ref 10.0–291.0)

## 2019-03-02 ENCOUNTER — Encounter: Payer: Self-pay | Admitting: Internal Medicine

## 2019-03-02 ENCOUNTER — Other Ambulatory Visit: Payer: Self-pay

## 2019-03-02 ENCOUNTER — Ambulatory Visit (INDEPENDENT_AMBULATORY_CARE_PROVIDER_SITE_OTHER): Payer: 59 | Admitting: Internal Medicine

## 2019-03-02 DIAGNOSIS — M792 Neuralgia and neuritis, unspecified: Secondary | ICD-10-CM | POA: Diagnosis not present

## 2019-03-02 MED ORDER — MONTELUKAST SODIUM 10 MG PO TABS: 10.0000 mg | ORAL_TABLET | Freq: Every day | ORAL | 3 refills | Status: DC

## 2019-03-02 MED ORDER — CETIRIZINE HCL 10 MG PO TABS: ORAL_TABLET | ORAL | 3 refills | Status: DC

## 2019-03-02 NOTE — Patient Instructions (Signed)
The capsaicin cream over the counter can help.   Tiger balm over the counter can also help.

## 2019-03-02 NOTE — Progress Notes (Signed)
   Subjective:   Patient ID: [name], female    DOB: Mar 27, 1956, 62 y.o.   MRN: [id number]  HPI The patient is a 63 YO female coming in for tingling in her right foot. Happens randomly and does hurt with this. She denies change in diet. She is up some weight due to stopping nutrisystem. She had lost about 30 pounds in 4 months for daughter's wedding. This was delayed initially and then canceled. She denies injury to the toe. 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 She was concerned about diabetes and had labs yesterday for wellness. She denies fevers or chills. Denies SOB or cough. Denies color change or rash on the foot. No swelling. The pain is sharp and needle like and lasts a short time. Can happen at night time and causes her to wake up.   Review of Systems  Constitutional: Negative.   HENT: Negative.   Eyes: Negative.   Respiratory: Negative for cough, chest tightness and shortness of breath.   Cardiovascular: Negative for chest pain, palpitations and leg swelling.  Gastrointestinal: Negative for abdominal distention, abdominal pain, constipation, diarrhea, nausea and vomiting.  Musculoskeletal: Negative.   Skin: Negative.   Neurological: Positive for numbness.  Psychiatric/Behavioral: Negative.     Objective:  Physical Exam Constitutional:      Appearance: She is well-developed. She is obese.  HENT:     Head: Normocephalic and atraumatic.  Neck:     Musculoskeletal: Normal range of motion.  Cardiovascular:     Rate and Rhythm: Normal rate and regular rhythm.  Pulmonary:     Effort: Pulmonary effort is normal. No respiratory distress.     Breath sounds: Normal breath sounds. No wheezing or rales.  Abdominal:     General: Bowel sounds are normal. There is no distension.     Palpations: Abdomen is soft.     Tenderness: There is no abdominal tenderness. There is no rebound.  Musculoskeletal:        General: No swelling or tenderness.   Comments: Scar on the right foot dorsum, sensation intact but slight difference 3 toes in question. Normal PT and DP pulses bilaterally.   Skin:    General: Skin is warm and dry.  Neurological:     Mental Status: She is alert and oriented to person, place, and time.     Coordination: Coordination normal.     Vitals:   03/02/19 1601  BP: 112/80  Pulse: 84  Temp: 98.1 F (36.7 C)  TempSrc: Oral  SpO2: 98%  Weight: 260 lb (117.9 kg)  Height: 5\' 3"  (1.6 m)    Assessment & Plan:

## 2019-03-03 NOTE — Assessment & Plan Note (Signed)
Advised that labs yesterday did not fully screen for nerve problems. Would like to check thyroid and vitamin D and B12 but she declines that today. She has some gabapentin at home for pain and advised she can use that or capsaicin or tiger balm otc.

## 2019-04-01 IMAGING — MR MR HEAD W/O CM
10 of 15 series · 33 of 48 positions shown · non-contrast
Comparison: CT HEAD May 03, 2018

CLINICAL DATA: Intermittent vertigo and headache beginning this
morning. Dizziness. History of hypertension, hyperlipidemia.

EXAM:
MRI HEAD WITHOUT CONTRAST
TECHNIQUE: Multiplanar, multiecho pulse sequences of the brain and surrounding
structures were obtained without intravenous contrast.

[Series 5: ax dwi_tracew · axial · 3.0mm · 1.50mm/px · z∈[-80,+55]mm · 7 of 92 slices shown]
[im 1/92]
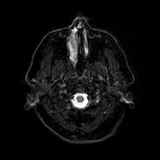
[im 16/92]
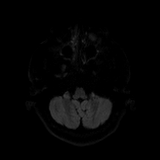
[im 31/92]
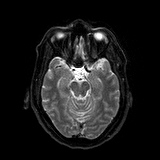
[im 46/92]
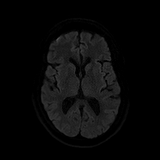
[im 61/92]
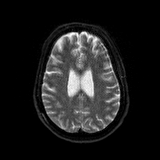
[im 76/92]
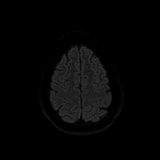
[im 92/92]
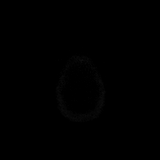

[Series 6: ax dwi_adc · axial · 3.0mm · 1.50mm/px · z∈[-80,+55]mm · 3 of 46 slices shown]
[im 1/46]
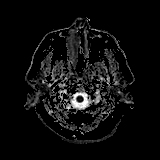
[im 23/46]
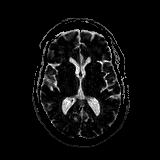
[im 46/46]
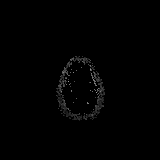

[Series 7: cor dwi_tracew · coronal · 5.0mm · 1.44mm/px · 5 of 68 slices shown]
[im 1/68]
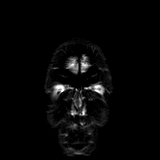
[im 17/68]
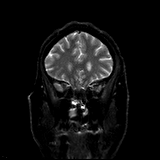
[im 34/68]
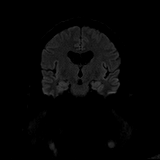
[im 51/68]
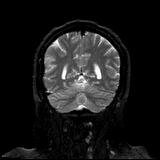
[im 68/68]
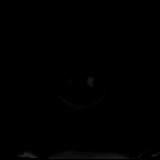

[Series 8: cor dwi_adc · coronal · 5.0mm · 1.44mm/px · 2 of 34 slices shown]
[im 1/34]
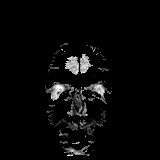
[im 34/34]
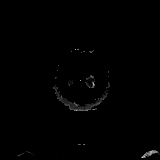

[Series 9: T1 · sagittal · 5.0mm · 0.75mm/px · 2 of 23 slices shown]
[im 1/23]
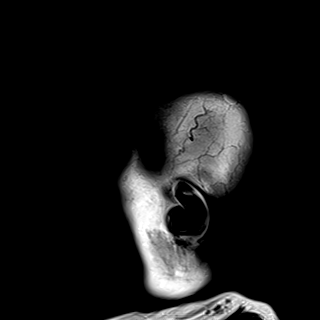
[im 23/23]
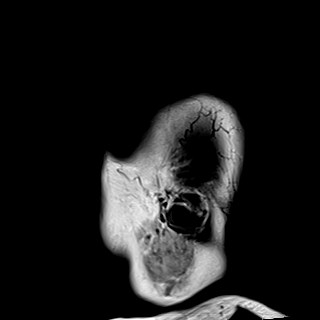

[Series 10: T2 · axial · 5.0mm · 0.72mm/px · z∈[-84,+60]mm · 2 of 25 slices shown (1 of 2)]
[im 1/25]
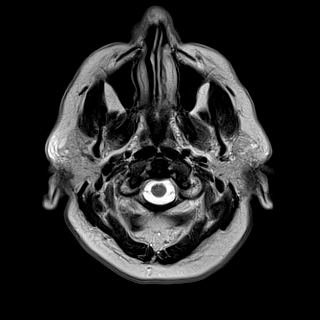
[im 25/25]
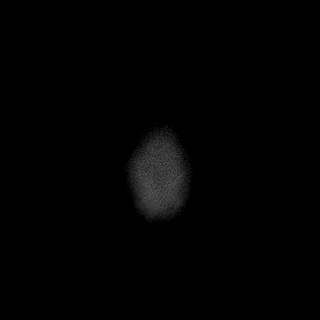

[Series 11: FLAIR · axial · 5.0mm · 0.45mm/px · z∈[-85,+59]mm · 2 of 25 slices shown]
[im 1/25]
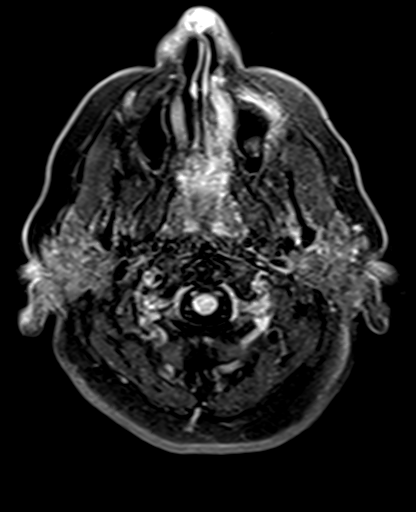
[im 25/25]
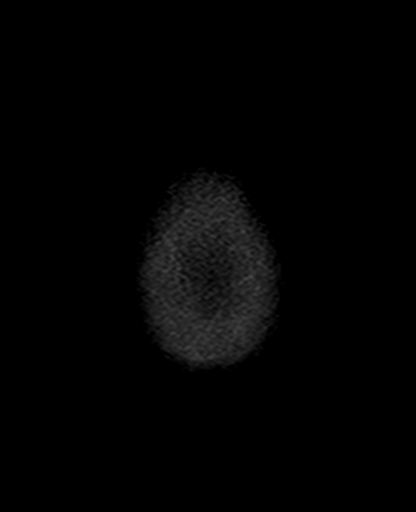

[Series 12: swi_images · axial · 3.0mm · 0.90mm/px · z∈[-102,+75]mm · 4 of 60 slices shown]
[im 1/60]
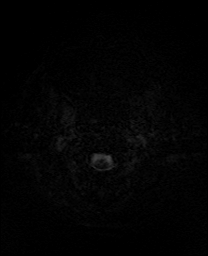
[im 20/60]
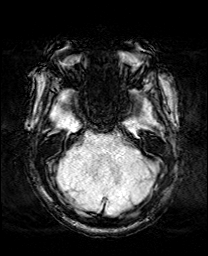
[im 40/60]
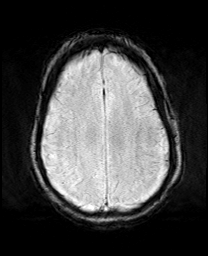
[im 60/60]
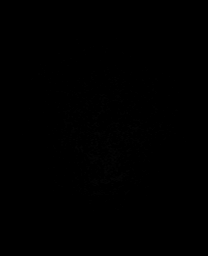

[Series 13: mip_images(sw) · axial · 24.0mm · 0.90mm/px · z∈[-91,+64]mm · 4 of 53 slices shown]
[im 1/53]
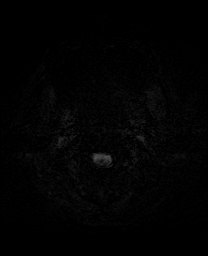
[im 18/53]
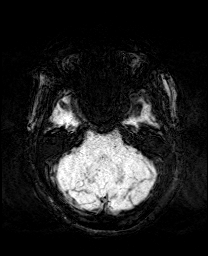
[im 35/53]
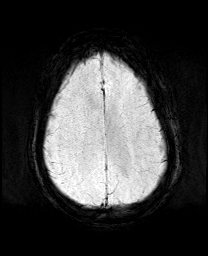
[im 53/53]
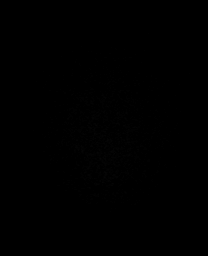

[Series 15: T2 · coronal · 5.0mm · 0.34mm/px · 2 of 29 slices shown (2 of 2)]
[im 1/29]
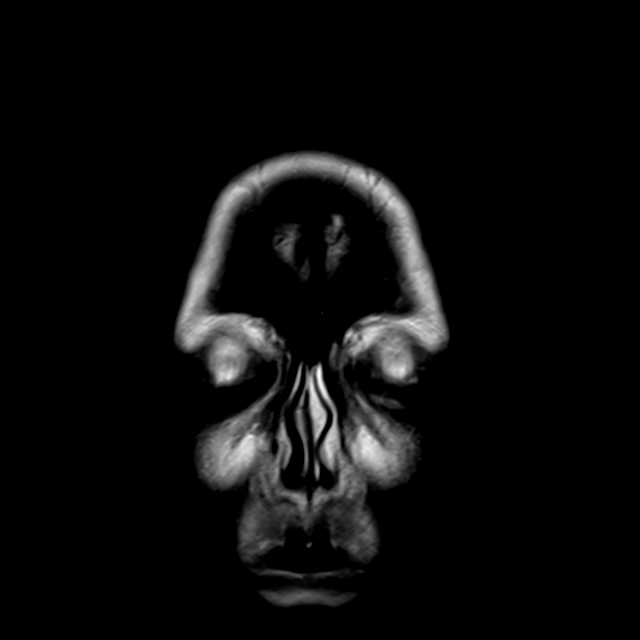
[im 29/29]
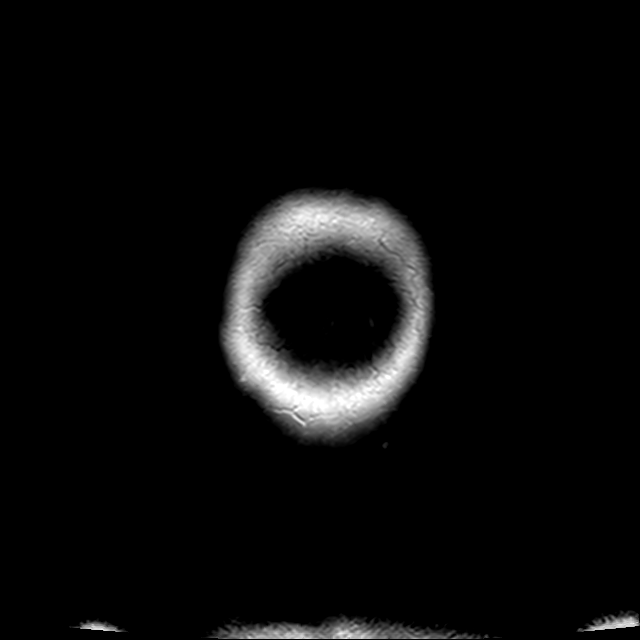

[33 of 48 positions shown; findings below may reference images not displayed]

FINDINGS: INTRACRANIAL CONTENTS: No reduced diffusion to suggest acute
ischemia or hyperacute demyelination. No susceptibility artifact to
suggest hemorrhage. Mild parenchymal brain volume loss. Linear T2
bright signal RIGHT basal ganglia extended periventricular margin
without ex vacuo dilatation of subjacent ventricle. No suspicious
parenchymal signal, masses, mass effect. No abnormal extra-axial
fluid collections. No extra-axial masses.

VASCULAR: Normal major intracranial vascular flow voids present at
skull base.

SKULL AND UPPER CERVICAL SPINE: No abnormal sellar expansion. No
suspicious calvarial bone marrow signal. Craniocervical junction
maintained.

SINUSES/ORBITS: Mild LEFT maxillary sinus mucosal thickening. LEFT
concha bullosa. Included ocular globes and orbital contents are
non-suspicious.

OTHER: None.
IMPRESSION: 1. No acute intracranial process.
2. Mild parenchymal brain volume loss.
3. RIGHT basal ganglia developmental venous anomaly versus old
lacunar infarct.

## 2019-04-27 ENCOUNTER — Encounter: Payer: Self-pay | Admitting: Internal Medicine

## 2019-04-27 ENCOUNTER — Other Ambulatory Visit: Payer: Self-pay

## 2019-04-27 ENCOUNTER — Ambulatory Visit (INDEPENDENT_AMBULATORY_CARE_PROVIDER_SITE_OTHER): Payer: 59 | Admitting: Internal Medicine

## 2019-04-27 VITALS — BP 140/90 | HR 84 | Temp 98.4°F | Ht 63.0 in | Wt 264.0 lb

## 2019-04-27 DIAGNOSIS — R21 Rash and other nonspecific skin eruption: Secondary | ICD-10-CM

## 2019-04-27 MED ORDER — METHYLPREDNISOLONE ACETATE 40 MG/ML IJ SUSP
40.0000 mg | Freq: Once | INTRAMUSCULAR | Status: AC
  Administered 2019-04-27: 40 mg via INTRAMUSCULAR

## 2019-04-27 NOTE — Progress Notes (Signed)
   Subjective:   Patient ID: [name], female    DOB: 17-Jan-1956, 63 y.o.   MRN: [id number]  HPI The patient is a 63 YO female coming in for concerns about rash. Started about 10 days or so ago. She does watch her daughter's dog which runs around outside with large yard. Her dog just goes out to grass area. Denies SOB or face swelling. Itching started on foot and then went up legs and on arms. A small patch on the neck. Overall worsening. Denies new soap, perfume, medications, otc medications. Has taken medication for flare of stomach last end of August. Denies fevers or chills. Denies weight change. Denies change in color of urine. Taking benadryl, zyrtec, singulair and no relief from that. Using cortisone ointment and benadryl ointment and calamine with limited relief.   Review of Systems  Constitutional: Negative.   HENT: Negative.   Eyes: Negative.   Respiratory: Negative for cough, chest tightness and shortness of breath.   Cardiovascular: Negative for chest pain, palpitations and leg swelling.  Gastrointestinal: Negative for abdominal distention, abdominal pain, constipation, diarrhea, nausea and vomiting.  Musculoskeletal: Negative.   Skin: Positive for rash.       itching  Neurological: Negative.   Psychiatric/Behavioral: Negative.     Objective:  Physical Exam Constitutional:      Appearance: She is well-developed.  HENT:     Head: Normocephalic and atraumatic.  Neck:     Musculoskeletal: Normal range of motion.  Cardiovascular:     Rate and Rhythm: Normal rate and regular rhythm.  Pulmonary:     Effort: Pulmonary effort is normal. No respiratory distress.     Breath sounds: Normal breath sounds. No wheezing or rales.  Abdominal:     General: Bowel sounds are normal. There is no distension.     Palpations: Abdomen is soft.     Tenderness: There is no abdominal tenderness. There is no rebound.  Skin:    General: Skin is warm and dry.     Comments: Some patchy red  lesions on the legs, itching throughout the visit and appears uncomfortable.   Neurological:     Mental Status: She is alert and oriented to person, place, and time.     Coordination: Coordination normal.     Vitals:   04/27/19 0955  BP: 140/90  Pulse: 84  Temp: 98.4 F (36.9 C)  TempSrc: Oral  SpO2: 98%  Weight: 264 lb (119.7 kg)  Height: 5\' 3"  (1.6 m)    Assessment & Plan:  Depo-medrol 40 mg IM given at visit

## 2019-04-27 NOTE — Patient Instructions (Signed)
We have given you a steroid shot today.   

## 2019-04-27 NOTE — Assessment & Plan Note (Signed)
Given depo-medrol 40 mg IM today and likely contact dermatitis potentially from the daughter's dog. Advised to have daughter check backyard for poison oak and remove if possible. Avoid scratching. Benadryl oral prn and cortisone cream.

## 2019-04-28 ENCOUNTER — Ambulatory Visit (INDEPENDENT_AMBULATORY_CARE_PROVIDER_SITE_OTHER): Payer: 59 | Admitting: Internal Medicine

## 2019-04-28 ENCOUNTER — Encounter: Payer: Self-pay | Admitting: Internal Medicine

## 2019-04-28 ENCOUNTER — Ambulatory Visit: Payer: Self-pay | Admitting: *Deleted

## 2019-04-28 ENCOUNTER — Other Ambulatory Visit: Payer: 59

## 2019-04-28 VITALS — BP 144/82 | HR 84 | Temp 97.7°F | Resp 16 | Ht 63.0 in | Wt 266.0 lb

## 2019-04-28 DIAGNOSIS — B019 Varicella without complication: Secondary | ICD-10-CM | POA: Diagnosis not present

## 2019-04-28 DIAGNOSIS — R21 Rash and other nonspecific skin eruption: Secondary | ICD-10-CM

## 2019-04-28 MED ORDER — HYDROXYZINE PAMOATE 25 MG PO CAPS: 25.0000 mg | ORAL_CAPSULE | Freq: Four times a day (QID) | ORAL | 0 refills | Status: DC | PRN

## 2019-04-28 MED ORDER — VALACYCLOVIR HCL 1 G PO TABS: 1000.0000 mg | ORAL_TABLET | Freq: Three times a day (TID) | ORAL | 0 refills | Status: DC

## 2019-04-28 NOTE — Assessment & Plan Note (Signed)
Concern for possible chickenpox Her rash is incredibly itchy and has spread quite quickly and she states she is never had chickenpox The lesions are consistent with probable varicella We will check varicella IgM and IgG to confirm Start Valtrex 1000 mg 3 times daily x1 week Hydroxyzine 25 mg every 6 hours as needed for itching  She will call if there is no improvement

## 2019-04-28 NOTE — Telephone Encounter (Signed)
Patient states rash still looks like same kind of rash, just really spread all over everywhere, patient has looked in mirror to make sure no facial/eye/neck/throat swelling, patient has been drinking fluids with no problems swallowing---I have scheduled appt with dr burns today at 3:45, patient needs to be seen again, re-evaluate rash and possible need high dose of depo shot and/or oral medications to control rash/reaction better---ok to schedule per dr burns

## 2019-04-28 NOTE — Patient Instructions (Signed)
Tests ordered today. Your results will be released to [place] (or called to you) after review.  If any changes need to be made, you will be notified at that same time.   Medications reviewed and updated.  Changes include :   Valtrex - anti-viral.   hydroxyzine for itch.  Your prescription(s) have been submitted to your pharmacy. Please take as directed and contact our office if you believe you are having problem(s) with the medication(s).    Please call if there is no improvement in your symptoms.       Chickenpox, Adult Chickenpox is an infection that is caused by a virus (viral infection). It causes fever and then an itchy rash that turns into blisters, which eventually change into scabs. Chickenpox spreads easily from person to person (is contagious). It starts to be contagious 1-2 days before the rash appears. It remains contagious until the blisters become crusted. Chickenpox can be very serious for adults. Possible complications of chickenpox include:  Skin infection.  Lung infection (pneumonia).  Brain infection (encephalitis).  A bloodstream infection (sepsis).  Bleeding problems.  Problems with balance and muscle control (cerebellar ataxia).  Having a baby with a birth defect, if you are pregnant. If you have had chickenpox once, you probably will not get it again. What are the causes? This condition is caused by the varicella-zoster virus. You may get the virus by:  Breathing in droplets from an infected person's cough or sneeze.  Having contact with fluids from the chickenpox rash.  Touching something that has been exposed to the virus (has been contaminated) and then touching your mouth, nose, or eyes. What increases the risk? This condition is more likely to develop in:  People who have never had chickenpox.  People who have never been given a medicine to protect against chickenpox (have not been vaccinated).  Health care workers.  People who live in or  spend time in crowded places. These may include: ? College students. ? Teachers. ? People in the [employer] ? People who live in an institution, such as a prison.  People who have a weakened disease-fighting (immune) system. You may have a weakened immune system if you: ? Have HIV (human immunodeficiency virus). ? Have AIDS (acquired immunodeficiency syndrome). ? Are receiving chemotherapy. ? Are taking medicines that reduce (suppress) the activity of the immune system. What are the signs or symptoms? Symptoms of chickenpox are usually worse in adults than they are in kids. Symptoms may include:  An itchy rash that changes over time: ? The rash starts as red spots, which then become bumps. ? The bumps turn into fluid-filled blisters. ? The blisters crust and turn into scabs, usually about 3-7 days after the rash started.  Body aches and pains.  Headache.  Tiredness.  Fever. Symptoms develop 10-21 days after a person has been exposed to the virus. How is this diagnosed? This condition may be diagnosed based on:  Your symptoms.  Your medical history.  A physical exam.  Blood tests.  Testing a fluid sample (culture) from the rash. How is this treated? Treatment for this condition may include:  Taking medicine to shorten the illness and make it less severe.  Applying calamine lotion to relieve itchiness.  Using baking soda or oatmeal baths to soothe itchy skin.  Using a medicine that reduces itching (antihistamine).  Taking antibiotic medicines, if you also develop a bacterial infection. Antibiotics do not cure viral infections such as chickenpox. Follow these instructions at home: Relieving  pain, itching, and discomfort   Try to stay cool and out of the sun. Sweating and being hot can make itching worse.  Cool baths can be soothing. Try adding baking soda or dry oatmeal to the water to reduce itching. Do not bathe in hot water.  Put cold, wet cloths (cold  compresses) on itchy areas as told by your health care provider.  Use calamine lotion as recommended by your health care provider. This is an over-the-counter lotion that helps to relieve itchiness.  If you have blisters in your mouth, do not eat or drink spicy, salty, or acidic things. Soft, bland, and cold foods and beverages are easiest to swallow.  Do not scratch or pick at the rash. To help avoid scratching: ? Keep your fingernails clean and cut short. ? Wear gloves or mittens while you sleep, if scratching is a problem. Medicines  Take or apply over-the-counter and prescription medicines only as told by your health care provider. These include any antihistamines and anti-itch creams.  If you were prescribed an antibiotic, take it as told by your health care provider. Do not stop taking the medicine even if your condition improves. Preventing infection   You start to be contagious 1-2 days before the rash appears. You remain contagious until your blisters become crusted. While you are contagious, avoid being around: ? Pregnant women. ? Infants. ? People receiving cancer treatments or long-term steroids. ? People with weakened immune systems. ? Older people. ? Anyone who has not had chickenpox before. ? Anyone who has not been vaccinated for chickenpox.  Stay home from work or school until all blisters have crusted and new spots stop appearing, or for as long as told by your health care provider.  If someone has been exposed to your chickenpox, contact a health care provider to see if that person needs vaccination.  Wash your hands often with soap and water. If soap and water are not available, use hand sanitizer. Have others in your household also wash their hands often. Doing this: ? Lowers your chance of getting a bacterial skin infection. ? Lowers the chance that chickenpox will spread to others. General instructions  Drink enough fluid to keep your urine pale yellow.   Rest as needed.  Keep all follow-up visits as told by your health care provider. This is important. How is this prevented?  Getting vaccinated is the best way to prevent chickenpox. If you have not been vaccinated, talk with your health care provider about getting the vaccine. Contact a health care provider if:  You have a fever.  You have yellowish-white fluid coming from your blisters.  You have areas of skin that become red or tender or feel warm to the touch.  You develop a cough.  Your urine is a darker color than usual. Get help right away if:  You cannot stop vomiting.  You feel confused.  You are very sleepy.  You have any of these problems: ? A stiff neck. ? A severe headache. ? Severe joint pain or stiffness. ? Trouble walking or keeping your balance. ? A seizure. ? Fast breathing. ? Trouble breathing. ? Chest pain. ? Eye pain, red eyes, or difficulty seeing. ? Blood in your urine or stool.  You start getting many bruises on your skin.  Your blisters bleed.  You develop blisters in your eye.  You have a fever and your symptoms suddenly get worse. Summary  Chickenpox is an infection that is caused by a virus (  viral infection).  It causes fever and then an itchy rash that turns into blisters, which eventually change into scabs.  It starts to spread easily from person to person (be contagious) 1-2 days before the rash appears. It remains contagious until the blisters become crusted.  Getting vaccinated is the best way to prevent chickenpox. This information is not intended to replace advice given to you by your health care provider. Make sure you discuss any questions you have with your health care provider. Document Released: 04/30/2008 Document Revised: 11/11/2018 Document Reviewed: 03/20/2017 Elsevier Patient Education  2020 [employer].

## 2019-04-28 NOTE — Telephone Encounter (Signed)
Pt is seeing Dr Quay Burow at [phone] today

## 2019-04-28 NOTE — Telephone Encounter (Signed)
Pt called in c/o severe itching from poison oak/ivy.   She saw Dr. Sharlet Salina for it yesterday.   She is getting worse and it's spreading in spite of the medication.   She is also taking Benadryl every 4 hrs and Zyrtec every night and it's not helping.    "I'm so miserable".   "Is there anything else I can take for this?"  I called into the office and spoke with Tammy.    She is going to have [facility] call pt back.   Dr. Sharlet Salina is not in.  Please call pt on [phone].  Pt agreeable to this plan.  I sent my notes to the office high priority.  Reason for Disposition . SEVERE itching (e.g., interferes with sleep or normal activities)  Answer Assessment - Initial Assessment Questions 1. APPEARANCE of RASH: "Describe the rash." (e.g., spots, blisters, raised areas, skin peeling, scaly)     I saw Dr. Sharlet Salina and said it was poison oak/ivy yesterday.   She gave me a cream and a shot yesterday.     I've gotten worse.   The blisters are breaking now.   I'm using alcohol on them.   It's in my groin, my face, left arm, between my breasts.   2. SIZE: "How big are the spots?" (e.g., tip of pen, eraser, coin; inches, centimeters)     *No Answer* 3. LOCATION: "Where is the rash located?"     See above 4. COLOR: "What color is the rash?" (Note: It is difficult to assess rash color in people with darker-colored skin. When this situation occurs, simply ask the caller to describe what they see.)     I'm miserable. 5. ONSET: "When did the rash begin?"     Sunday started on my back.   I washed my dog just in case it was from him.      6. FEVER: "Do you have a fever?" If so, ask: "What is your temperature, how was it measured, and when did it start?"     No 7. ITCHING: "Does the rash itch?" If so, ask: "How bad is the itch?" (Scale 1-10; or mild, moderate, severe)     Itching so bad 8. CAUSE: "What do you think is causing the rash?"     Dr. Sharlet Salina said it was poison oak/ivy. 9. NEW MEDICATION: "What new  medication are you taking?" (e.g., name of antibiotic) "When did you start taking this medication?".     Prednisone cream 10. OTHER SYMPTOMS: "Do you have any other symptoms?" (e.g., sore throat, fever, joint pain)       No 11. PREGNANCY: "Is there any chance you are pregnant?" "When was your last menstrual period?"       Not asked  Answer Assessment - Initial Assessment Questions 1. APPEARANCE of RASH: "Describe the rash."      I saw Dr. Sharlet Salina yesterday and she said it was poison oak/ivy.   Today I'm much worse.  I'm itching awful and it's spreading. 2. LOCATION: "Where is the rash located?"      Left arm, face,between breasts, groin, back. 3. SIZE: "How large is the rash?"      *No Answer* 4. ONSET: "When did the rash begin?"      Sunday 5. ITCHING: "Does the rash itch?" If so, ask: "How bad is it?"   - MILD - doesn't interfere with normal activities   - MODERATE-SEVERE: interferes with work, school, sleep, or other activities  Severe 6. PREGNANCY: "Is there any chance you are pregnant?" "When was your last menstrual period?"     Not asked  Protocols used: POISON IVY - [place] - [place], [place] ON [id number]-A-AH

## 2019-04-28 NOTE — Progress Notes (Signed)
Subjective:    Patient ID: [name], female    DOB: 07/14/1956, 63 y.o.   MRN: [id number]  HPI The patient is here for an acute visit.   Rash: She was seen yesterday for a rash that started approximately 10 days ago.  She watches her daughter's dog who was outside, but she herself does not do any yard work.  The rash started on her right dorsal foot.  Yesterday was thought that she probably had some contact plant dermatitis-most likely from the dog.  She received a Depo-Medrol 40 mg injection and has been taking Benadryl every 4 hours.  She has been using cortisone cream.  Since then the rash has significantly worsened.  It is now spread.  She has the rash on both feet, both legs, chest, between her breasts, face, arms and hands.  It is incredibly itchy.  She has not been able to sleep.  She denies any new products.  She denies any new medication.  She has had steroids in the past many times and has never had an allergic reaction.  When questioned she states she has never had chickenpox.  Her siblings have never had either.  Her father did not get it until he was 60 years of age.    She denies any cold symptoms, coughing, wheezing, shortness of breath, fevers, chills, headaches and lightheadedness.  Medications and allergies reviewed with patient and updated if appropriate.  Patient Active Problem List   Diagnosis Date Noted  . Rash 04/27/2019  . Complicated grief [id number]  . Diverticulitis 05/15/2018  . Epigastric pain 06/11/2017  . Morbid obesity ([place]) 03/19/2016  . History of TIA (transient ischemic attack) 03/14/2015  . Routine general medical examination at a health care facility 11/23/2012  . Low back pain 11/02/2012  . Neuralgia 03/16/2012  . GERD 06/14/2009  . Essential hypertension 06/16/2008  . Hyperlipidemia 12/29/2007  . Hyperglycemia 11/26/2007  . Mild intermittent asthma 11/26/2007    Current Outpatient Medications on File Prior to Visit  Medication  Sig Dispense Refill  . albuterol (VENTOLIN HFA) 108 (90 Base) MCG/ACT inhaler Inhale 2 puffs into the lungs every 4 (four) hours as needed for wheezing or shortness of breath. 1 Inhaler 1  . cetirizine (ZYRTEC) 10 MG tablet TAKE 1 TABLET BY MOUTH AT BEDTIME 90 tablet 3  . ciprofloxacin (CIPRO) 500 MG tablet TK 1 T PO  BID  0  . Evolocumab (REPATHA SURECLICK) XX123456 MG/ML SOAJ Inject 140 mg into the skin every 14 (fourteen) days. 2 pen 11  . fluticasone (FLONASE) 50 MCG/ACT nasal spray SHAKE Liquid and Use 2 sprays in each nostril twice a day  5  . fluticasone (FLOVENT HFA) 110 MCG/ACT inhaler Inhale 2 puffs into the lungs every 4 (four) hours as needed (for shortness of breath).    . fluticasone furoate-vilanterol (BREO ELLIPTA) 200-25 MCG/INH AEPB Inhale 1 puff into the lungs daily. 90 each 3  . loperamide (IMODIUM) 2 MG capsule Take 1 capsule (2 mg total) by mouth 4 (four) times daily as needed for diarrhea or loose stools. 12 capsule 0  . metroNIDAZOLE (FLAGYL) 250 MG tablet   0  . montelukast (SINGULAIR) 10 MG tablet Take 1 tablet (10 mg total) by mouth at bedtime. 90 tablet 3  . Nutritional Supplements (JUICE PLUS FIBRE PO) Take 4 each by mouth daily.    . ondansetron (ZOFRAN ODT) 4 MG disintegrating tablet Take 1 tablet (4 mg total) by mouth every 6 (six) hours as  needed. 20 tablet 0  . ondansetron (ZOFRAN) 8 MG tablet     . temazepam (RESTORIL) 7.5 MG capsule Take 1-2 capsules (7.5-15 mg total) by mouth at bedtime as needed for sleep. 30 capsule 0   No current facility-administered medications on file prior to visit.     Past Medical History:  Diagnosis Date  . Allergy   . Arthritis    neck, knees, ankles, feet, back  . Asthma    prn inhaler  . Dental crowns present   . Epigastric pain 06/11/2017  . Essential hypertension 06/16/2008   Qualifier: Diagnosis of  By: Ronnald Ramp MD, Arvid Right.   . Hallux valgus of right foot 05/2014  . History of TIA (transient ischemic attack) 2004  .  Hyperglycemia 11/26/2007   Qualifier: Diagnosis of  By: Larose Kells MD, [place] Hyperlipidemia   . Hypertension   . Limited joint range of motion    neck - states has bone spurs C2-5  . Mild intermittent asthma 11/26/2007      . Morbid obesity ([place]) 03/19/2016  . TMJ syndrome     Past Surgical History:  Procedure Laterality Date  . ACROMIONECTOMY Left 08/18/2008  . APPENDECTOMY  1979  . BUNIONECTOMY Left 07/17/2007  . BUNIONECTOMY Right 05/26/2014   Procedure: RIGHT FOOT LAPIDUS BUNION CORRECTION AMD MODIFIED MCBRIDE BUNIONECTOMY;  Surgeon: Wylene Simmer, MD;  Location: [facility];  Service: Orthopedics;  Laterality: Right;  . CARDIAC CATHETERIZATION N/A 06/28/2016   Procedure: Left Heart Cath and Coronary Angiography;  Surgeon: Peter M Martinique, MD;  Location: [facility];  Service: Cardiovascular;  Laterality: N/A;  . CESAREAN SECTION  1986  . correction of bunionectomy  2016   right foot   . GANGLION CYST EXCISION     wrist  . NASAL SEPTUM SURGERY  1981  . ROTATOR CUFF REPAIR W/ DISTAL CLAVICLE EXCISION Left 08/18/2008  . SHOULDER ARTHROSCOPY Left 06/07/2008  . thumb surgery  07-28-2015   left thumb knuckle collasped and had surgery   . TONSILLECTOMY  1964  . TUBAL LIGATION  1999    Social History   Socioeconomic History  . Marital status: Married    Spouse name: Not on file  . Number of children: 2  . Years of education: Not on file  . Highest education level: Not on file  Occupational History  . Not on file  Social Needs  . Financial resource strain: Not on file  . Food insecurity    Worry: Not on file    Inability: Not on file  . Transportation needs    Medical: Not on file    Non-medical: Not on file  Tobacco Use  . Smoking status: Former Smoker    Quit date: 09/12/2005    Years since quitting: 13.6  . Smokeless tobacco: Never Used  Substance and Sexual Activity  . Alcohol use: Yes    Alcohol/week: 0.0 standard drinks    Comment: occasionally   . Drug use: No  . Sexual activity: Not on file  Lifestyle  . Physical activity    Days per week: Not on file    Minutes per session: Not on file  . Stress: Not on file  Relationships  . Social Herbalist on phone: Not on file    Gets together: Not on file    Attends religious service: Not on file    Active member of club or organization: Not on file  Attends meetings of clubs or organizations: Not on file    Relationship status: Not on file  Other Topics Concern  . Not on file  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[employer]    Family History  Problem Relation Age of Onset  . Cancer Mother        lung  . Diabetes Mother   . Thalassemia Mother   . Colon polyps Mother   . Cancer Brother        lymphatic  . Heart disease Paternal Aunt   . Atrial fibrillation Father   . Thalassemia Sister   . Thalassemia Maternal Aunt   . Colon cancer Neg Hx   . Rectal cancer Neg Hx   . Stomach cancer Neg Hx     Review of Systems  Constitutional: Negative for chills and fever.  HENT: Negative for congestion, ear pain, sinus pain and sore throat.   Respiratory: Negative for cough, shortness of breath and wheezing.   Skin: Positive for rash.  Neurological: Negative for light-headedness and headaches.       Objective:   Vitals:   04/28/19 1548  BP: (!) 144/82  Pulse: 84  Resp: 16  Temp: 97.7 F (36.5 C)  SpO2: 97%   BP Readings from Last 3 Encounters:  04/28/19 (!) 144/82  04/27/19 140/90  03/02/19 112/80   Wt Readings from Last 3 Encounters:  04/28/19 266 lb (120.7 kg)  04/27/19 264 lb (119.7 kg)  03/02/19 260 lb (117.9 kg)   Body mass index is 47.12 kg/m.   Physical Exam Constitutional:      General: She is in acute distress (She is in distress because she is incredibly uncomfortable related to itching.  She is itching the entire visit and unable to sit still.).     Appearance: She is not ill-appearing, toxic-appearing or diaphoretic.  HENT:     Head:  Normocephalic and atraumatic.  Skin:    General: Skin is warm and dry.     Findings: Rash (Patchy areas of rash on legs, arms, chest-some lesions that look like blisters, she has one that is draining, some that look like scabs, some areas of mild erythema and papules) present.  Neurological:     Mental Status: She is alert.            Assessment & Plan:    See Problem List for Assessment and Plan of chronic medical problems.

## 2019-04-29 ENCOUNTER — Encounter: Payer: Self-pay | Admitting: Internal Medicine

## 2019-04-30 ENCOUNTER — Telehealth: Payer: Self-pay

## 2019-04-30 ENCOUNTER — Encounter: Payer: Self-pay | Admitting: Internal Medicine

## 2019-04-30 NOTE — Telephone Encounter (Signed)
Copied from [place] [id number]. Topic: General - Other >> Apr 29, 2019  3:39 PM Keene Breath wrote: Reason for CRM: Patient called to inform the nurse that she is still itching badly and would like to know her test results as well.  Please call patient to let her know what she should do for the itching.  CB# [phone]

## 2019-04-30 NOTE — Telephone Encounter (Signed)
Response sent by Dr. Quay Burow through my chart. Pt read message.

## 2019-05-01 LAB — VARICELLA ZOSTER ANTIBODY, IGM: Varicella Zoster Ab IgM: <=0.9 (ref <=0.90)

## 2019-05-01 LAB — VARICELLA ZOSTER ANTIBODY, IGG: Varicella IgG: 397.5 index

## 2019-05-02 ENCOUNTER — Encounter: Payer: Self-pay | Admitting: Internal Medicine

## 2019-05-02 MED ORDER — PREDNISONE 10 MG PO TABS: ORAL_TABLET | ORAL | 0 refills | Status: DC

## 2019-05-06 ENCOUNTER — Telehealth: Payer: Self-pay | Admitting: Internal Medicine

## 2019-05-06 NOTE — Telephone Encounter (Signed)
Called patient and informed. She will call back to schedule.

## 2019-05-06 NOTE — Telephone Encounter (Signed)
-----   Message from Cresenciano Lick, [place] sent at 05/03/2019  8:26 AM EDT ----- Regarding: Appt Please schedule for tetanus and flu vaccine. Pt is up to date on pneumonia vaccine for now. Thanks

## 2019-05-11 NOTE — Progress Notes (Deleted)
HPI: FU hyperlipidemia andchest pain. Echo 2004 with normal LV function; mild LAE. Nuclear study September 2017 showed ejection fraction 63%. There was a small defect suggestive of ischemia in the apical lateral wall.Cardiac catheterization November 2017 showed normal LV function and normal coronary anatomy.Pt intolerant to statinsand referred to lipid clinic; started on repatha.  Since last seen,the patient has dyspnea with more extreme activities but not with routine activities. It is relieved with rest. It is not associated with chest pain. There is no orthopnea, PND or pedal edema. There is no syncope or palpitations. There is no exertional chest pain.  Current Outpatient Medications  Medication Sig Dispense Refill  . albuterol (VENTOLIN HFA) 108 (90 Base) MCG/ACT inhaler Inhale 2 puffs into the lungs every 4 (four) hours as needed for wheezing or shortness of breath. 1 Inhaler 1  . cetirizine (ZYRTEC) 10 MG tablet TAKE 1 TABLET BY MOUTH AT BEDTIME 90 tablet 3  . ciprofloxacin (CIPRO) 500 MG tablet TK 1 T PO  BID  0  . Evolocumab (REPATHA SURECLICK) XX123456 MG/ML SOAJ Inject 140 mg into the skin every 14 (fourteen) days. 2 pen 11  . fluticasone (FLONASE) 50 MCG/ACT nasal spray SHAKE Liquid and Use 2 sprays in each nostril twice a day  5  . fluticasone (FLOVENT HFA) 110 MCG/ACT inhaler Inhale 2 puffs into the lungs every 4 (four) hours as needed (for shortness of breath).    . fluticasone furoate-vilanterol (BREO ELLIPTA) 200-25 MCG/INH AEPB Inhale 1 puff into the lungs daily. 90 each 3  . hydrOXYzine (VISTARIL) 25 MG capsule Take 1 capsule (25 mg total) by mouth every 6 (six) hours as needed for itching. 30 capsule 0  . loperamide (IMODIUM) 2 MG capsule Take 1 capsule (2 mg total) by mouth 4 (four) times daily as needed for diarrhea or loose stools. 12 capsule 0  . metroNIDAZOLE (FLAGYL) 250 MG tablet   0  . montelukast (SINGULAIR) 10 MG tablet Take 1 tablet (10 mg total) by mouth at  bedtime. 90 tablet 3  . Nutritional Supplements (JUICE PLUS FIBRE PO) Take 4 each by mouth daily.    . ondansetron (ZOFRAN ODT) 4 MG disintegrating tablet Take 1 tablet (4 mg total) by mouth every 6 (six) hours as needed. 20 tablet 0  . ondansetron (ZOFRAN) 8 MG tablet     . predniSONE (DELTASONE) 10 MG tablet Take 6 tabs po qd x 3 days, 4 tabs po qd x 3 days, then 3 tabs po qd x 3 days, then 2 tabs po qd x 3 days, then 1 tab po qd x 3 days, then 1/2 tab x 3 days 50 tablet 0  . temazepam (RESTORIL) 7.5 MG capsule Take 1-2 capsules (7.5-15 mg total) by mouth at bedtime as needed for sleep. 30 capsule 0  . valACYclovir (VALTREX) 1000 MG tablet Take 1 tablet (1,000 mg total) by mouth 3 (three) times daily. 21 tablet 0   No current facility-administered medications for this visit.      Past Medical History:  Diagnosis Date  . Allergy   . Arthritis    neck, knees, ankles, feet, back  . Asthma    prn inhaler  . Dental crowns present   . Epigastric pain 06/11/2017  . Essential hypertension 06/16/2008   Qualifier: Diagnosis of  By: Ronnald Ramp MD, [place] Right.   . Hallux valgus of right foot 05/2014  . History of TIA (transient ischemic attack) 2004  . Hyperglycemia 11/26/2007  Qualifier: Diagnosis of  By: Larose Kells MD, [place]   . Hyperlipidemia   . Hypertension   . Limited joint range of motion    neck - states has bone spurs C2-5  . Mild intermittent asthma 11/26/2007      . Morbid obesity ([place]) 03/19/2016  . TMJ syndrome     Past Surgical History:  Procedure Laterality Date  . ACROMIONECTOMY Left 08/18/2008  . APPENDECTOMY  1979  . BUNIONECTOMY Left 07/17/2007  . BUNIONECTOMY Right 05/26/2014   Procedure: RIGHT FOOT LAPIDUS BUNION CORRECTION AMD MODIFIED MCBRIDE BUNIONECTOMY;  Surgeon: Wylene Simmer, MD;  Location: [facility];  Service: Orthopedics;  Laterality: Right;  . CARDIAC CATHETERIZATION N/A 06/28/2016   Procedure: Left Heart Cath and Coronary Angiography;  Surgeon: Peter M  Martinique, MD;  Location: [facility];  Service: Cardiovascular;  Laterality: N/A;  . CESAREAN SECTION  1986  . correction of bunionectomy  2016   right foot   . GANGLION CYST EXCISION     wrist  . NASAL SEPTUM SURGERY  1981  . ROTATOR CUFF REPAIR W/ DISTAL CLAVICLE EXCISION Left 08/18/2008  . SHOULDER ARTHROSCOPY Left 06/07/2008  . thumb surgery  07-28-2015   left thumb knuckle collasped and had surgery   . TONSILLECTOMY  1964  . TUBAL LIGATION  1999    Social History   Socioeconomic History  . Marital status: Married    Spouse name: Not on file  . Number of children: 2  . Years of education: Not on file  . Highest education level: Not on file  Occupational History  . Not on file  Social Needs  . Financial resource strain: Not on file  . Food insecurity    Worry: Not on file    Inability: Not on file  . Transportation needs    Medical: Not on file    Non-medical: Not on file  Tobacco Use  . Smoking status: Former Smoker    Quit date: 09/12/2005    Years since quitting: 13.6  . Smokeless tobacco: Never Used  Substance and Sexual Activity  . Alcohol use: Yes    Alcohol/week: 0.0 standard drinks    Comment: occasionally  . Drug use: No  . Sexual activity: Not on file  Lifestyle  . Physical activity    Days per week: Not on file    Minutes per session: Not on file  . Stress: Not on file  Relationships  . Social Herbalist on phone: Not on file    Gets together: Not on file    Attends religious service: Not on file    Active member of club or organization: Not on file    Attends meetings of clubs or organizations: Not on file    Relationship status: Not on file  . Intimate partner violence    Fear of current or ex partner: Not on file    Emotionally abused: Not on file    Physically abused: Not on file    Forced sexual activity: Not on file  Other Topics Concern  . Not on file  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[employer]    Family History  Problem  Relation Age of Onset  . Cancer Mother        lung  . Diabetes Mother   . Thalassemia Mother   . Colon polyps Mother   . Cancer Brother        lymphatic  . Heart disease Paternal  Aunt   . Atrial fibrillation Father   . Thalassemia Sister   . Thalassemia Maternal Aunt   . Colon cancer Neg Hx   . Rectal cancer Neg Hx   . Stomach cancer Neg Hx     ROS: no fevers or chills, productive cough, hemoptysis, dysphasia, odynophagia, melena, hematochezia, dysuria, hematuria, rash, seizure activity, orthopnea, PND, pedal edema, claudication. Remaining systems are negative.  Physical Exam: Well-developed well-nourished in no acute distress.  Skin is warm and dry.  HEENT is normal.  Neck is supple.  Chest is clear to auscultation with normal expansion.  Cardiovascular exam is regular rate and rhythm.  Abdominal exam nontender or distended. No masses palpated. Extremities show no edema. neuro grossly intact  ECG- personally reviewed  A/P  1 history of chest pain-no further episodes.  Previous catheterization showed no coronary disease.  2 hyperlipidemia-continue Repatha.  3 dyspnea-this is felt secondary to deconditioning and obesity hypoventilation syndrome.  4 obesity-we discussed the importance of diet, exercise and weight loss.  Kirk Ruths, MD

## 2019-05-21 ENCOUNTER — Ambulatory Visit: Payer: 59 | Admitting: Cardiology

## 2019-06-28 ENCOUNTER — Other Ambulatory Visit: Payer: Self-pay

## 2019-06-28 MED ORDER — REPATHA SURECLICK 140 MG/ML ~~LOC~~ SOAJ: 140.0000 mg | SUBCUTANEOUS | 11 refills | Status: DC

## 2019-08-30 LAB — HM MAMMOGRAPHY

## 2019-10-12 ENCOUNTER — Other Ambulatory Visit: Payer: Self-pay

## 2019-10-12 ENCOUNTER — Encounter: Payer: Self-pay | Admitting: Internal Medicine

## 2019-10-12 ENCOUNTER — Ambulatory Visit: Payer: 59 | Admitting: Internal Medicine

## 2019-10-12 DIAGNOSIS — M79641 Pain in right hand: Secondary | ICD-10-CM

## 2019-10-12 MED ORDER — METHYLPREDNISOLONE ACETATE 40 MG/ML IJ SUSP
40.0000 mg | Freq: Once | INTRAMUSCULAR | Status: AC
  Administered 2019-10-12: 08:00:00 40 mg via INTRAMUSCULAR

## 2019-10-12 MED ORDER — CETIRIZINE HCL 10 MG PO TABS: ORAL_TABLET | ORAL | 3 refills | Status: DC

## 2019-10-12 MED ORDER — MONTELUKAST SODIUM 10 MG PO TABS: 10.0000 mg | ORAL_TABLET | Freq: Every day | ORAL | 3 refills | Status: DC

## 2019-10-12 NOTE — Patient Instructions (Addendum)
We have given you the steroid shot today to help the hand.   If this does not help call Dr. Veronia Beets to get injection in the hand.   Make sure to wear the brace at night time to help.

## 2019-10-12 NOTE — Assessment & Plan Note (Signed)
Given depo-medrol 40 mg IM at visit and advised to wear carpal tunnel brace on the right hand at night time. If no relief advised to schedule with Dr. Veronia Beets her orthopedic doctor. Prior surgeries on the right wrist.

## 2019-10-12 NOTE — Progress Notes (Signed)
   Subjective:   Patient ID: [name], female    DOB: 28-Aug-1955, 64 y.o.   MRN: [id number]  HPI The patient is a 64 YO female coming in for right thumb problem. Believes she is having some carpal tunnel. Started in the last week or so and she is having terrible pain. She has numbness in the thumb and 2nd finger on the right. Denies weakness. Is having to use her left hand more often. Right is non-dominant.   Review of Systems  Constitutional: Negative.   HENT: Negative.   Eyes: Negative.   Respiratory: Negative for cough, chest tightness and shortness of breath.   Cardiovascular: Negative for chest pain, palpitations and leg swelling.  Gastrointestinal: Negative for abdominal distention, abdominal pain, constipation, diarrhea, nausea and vomiting.  Musculoskeletal: Positive for arthralgias and myalgias.  Skin: Negative.   Neurological: Positive for numbness.  Psychiatric/Behavioral: Negative.     Objective:  Physical Exam Constitutional:      Appearance: She is well-developed.  HENT:     Head: Normocephalic and atraumatic.  Cardiovascular:     Rate and Rhythm: Normal rate and regular rhythm.  Pulmonary:     Effort: Pulmonary effort is normal. No respiratory distress.     Breath sounds: Normal breath sounds. No wheezing or rales.  Abdominal:     General: Bowel sounds are normal. There is no distension.     Palpations: Abdomen is soft.     Tenderness: There is no abdominal tenderness. There is no rebound.  Musculoskeletal:        General: Tenderness present.     Cervical back: Normal range of motion.     Comments: Pain right wrist and base of the thumb  Skin:    General: Skin is warm and dry.  Neurological:     Mental Status: She is alert and oriented to person, place, and time.     Cranial Nerves: Cranial nerve deficit present.     Coordination: Coordination normal.     Comments: Numbness right hand 1-2nd fingers     Vitals:   10/12/19 0804  BP: 136/84    Pulse: 87  Temp: 98.8 F (37.1 C)  TempSrc: Oral  SpO2: 97%  Weight: 261 lb (118.4 kg)  Height: 5\' 3"  (1.6 m)    This visit occurred during the SARS-CoV-2 public health emergency.  Safety protocols were in place, including screening questions prior to the visit, additional usage of staff PPE, and extensive cleaning of exam room while observing appropriate contact time as indicated for disinfecting solutions.   Assessment & Plan:  Depo-medrol 40 mg IM given at visit

## 2019-12-29 ENCOUNTER — Ambulatory Visit: Payer: 59

## 2020-01-31 ENCOUNTER — Encounter: Payer: Self-pay | Admitting: Internal Medicine

## 2020-01-31 ENCOUNTER — Telehealth (INDEPENDENT_AMBULATORY_CARE_PROVIDER_SITE_OTHER): Payer: 59 | Admitting: Internal Medicine

## 2020-01-31 VITALS — BP 136/84 | HR 71 | Temp 98.1°F | Ht 63.0 in | Wt 259.0 lb

## 2020-01-31 DIAGNOSIS — J4520 Mild intermittent asthma, uncomplicated: Secondary | ICD-10-CM | POA: Diagnosis not present

## 2020-01-31 DIAGNOSIS — F4329 Adjustment disorder with other symptoms: Secondary | ICD-10-CM | POA: Diagnosis not present

## 2020-01-31 DIAGNOSIS — J0110 Acute frontal sinusitis, unspecified: Secondary | ICD-10-CM

## 2020-01-31 DIAGNOSIS — F4321 Adjustment disorder with depressed mood: Secondary | ICD-10-CM

## 2020-01-31 DIAGNOSIS — Z23 Encounter for immunization: Secondary | ICD-10-CM | POA: Diagnosis not present

## 2020-01-31 MED ORDER — METHYLPREDNISOLONE ACETATE 40 MG/ML IJ SUSP
40.0000 mg | Freq: Once | INTRAMUSCULAR | Status: AC
  Administered 2020-01-31: 40 mg via INTRAMUSCULAR

## 2020-01-31 MED ORDER — AZITHROMYCIN 250 MG PO TABS
ORAL_TABLET | ORAL | 0 refills | Status: DC
Start: 2020-01-31 — End: 2020-03-03

## 2020-01-31 NOTE — Progress Notes (Signed)
   Subjective:   Patient ID: [name], female    DOB: 12/12/1955, 64 y.o.   MRN: [id number]  HPI The patient is a 64 YO female coming in for concerns about sinus infection and asthma. She has been fully vaccinated against covid-19. She typically gets sinus infections in the fall. She has been fighting against her sinuses for the last 2-3 weeks. She is getting worse overall. Some congestion starting to go down into her lungs. She denies SOB. Is using albuterol more for congestion. Having sinus pain and ear pressure. No ear pain. Mild nose drainage mostly clear to sometimes yellow. She is having improvement in mood some with new grandchild on the way and planning for retirement in the next year.   Review of Systems  Constitutional: Negative.  Negative for activity change, appetite change, fatigue, fever and unexpected weight change.  HENT: Positive for congestion, ear pain, postnasal drip, rhinorrhea, sinus pressure and sinus pain. Negative for ear discharge, sneezing, sore throat, tinnitus, trouble swallowing and voice change.   Eyes: Negative.   Respiratory: Positive for cough and chest tightness. Negative for shortness of breath and wheezing.   Cardiovascular: Negative.  Negative for chest pain, palpitations and leg swelling.  Gastrointestinal: Negative.  Negative for abdominal distention, abdominal pain, constipation, diarrhea, nausea and vomiting.  Musculoskeletal: Negative.   Skin: Negative.   Neurological: Negative.   Psychiatric/Behavioral: Negative.     Objective:  Physical Exam Constitutional:      Appearance: She is well-developed. She is obese.  HENT:     Head: Normocephalic and atraumatic.     Comments: Oropharynx with redness and clear drainage, nose with swollen turbinates, TMs normal bilaterally. Frontal sinus pain to palpation Neck:     Thyroid: No thyromegaly.  Cardiovascular:     Rate and Rhythm: Normal rate and regular rhythm.  Pulmonary:     Effort: Pulmonary  effort is normal. No respiratory distress.     Breath sounds: Normal breath sounds. No wheezing or rales.  Abdominal:     General: Bowel sounds are normal. There is no distension.     Palpations: Abdomen is soft.     Tenderness: There is no abdominal tenderness. There is no rebound.  Musculoskeletal:        General: Tenderness present.     Cervical back: Normal range of motion.  Lymphadenopathy:     Cervical: No cervical adenopathy.  Skin:    General: Skin is warm and dry.  Neurological:     Mental Status: She is alert and oriented to person, place, and time.     Coordination: Coordination normal.     Vitals:   01/31/20 1317  BP: 136/84  Pulse: 71  Temp: 98.1 F (36.7 C)  TempSrc: Oral  SpO2: 97%  Weight: 259 lb (117.5 kg)  Height: 5\' 3"  (1.6 m)    This visit occurred during the SARS-CoV-2 public health emergency.  Safety protocols were in place, including screening questions prior to the visit, additional usage of staff PPE, and extensive cleaning of exam room while observing appropriate contact time as indicated for disinfecting solutions.   Assessment & Plan:  Depo-medrol 40 mg IM and Tdap given at visit

## 2020-02-01 NOTE — Assessment & Plan Note (Signed)
She is doing better overall as she has new grandchild on the way. Given Tdap for upcoming birth. Coping okay with pandemic and planning for retirement within 1 year.

## 2020-02-01 NOTE — Assessment & Plan Note (Signed)
With flare today. Use albuterol prn. Rx azithromycin and depo-medrol 40 mg IM given at visit which typically helps.

## 2020-02-01 NOTE — Assessment & Plan Note (Signed)
Given depo-medrol and azithromycin today at visit. Advised to continue zyrtec, flonase as well as singulair.

## 2020-02-28 ENCOUNTER — Other Ambulatory Visit: Payer: Self-pay

## 2020-02-28 ENCOUNTER — Encounter: Payer: Self-pay | Admitting: Family

## 2020-02-28 ENCOUNTER — Telehealth (INDEPENDENT_AMBULATORY_CARE_PROVIDER_SITE_OTHER): Payer: 59 | Admitting: Family

## 2020-02-28 DIAGNOSIS — B86 Scabies: Secondary | ICD-10-CM | POA: Diagnosis not present

## 2020-02-28 MED ORDER — PERMETHRIN 5 % EX CREA: 1.0000 "application " | TOPICAL_CREAM | Freq: Once | CUTANEOUS | 0 refills | Status: DC

## 2020-02-28 MED ORDER — PERMETHRIN 5 % EX CREA: 1.0000 "application " | TOPICAL_CREAM | Freq: Once | CUTANEOUS | 0 refills | Status: AC

## 2020-02-28 NOTE — Progress Notes (Signed)
Virtual Visit via Video   I connected with patient on 02/28/20 at  2:20 PM EDT by a video enabled telemedicine application and verified that I am speaking with the correct person using two identifiers.  Location patient: Home Location provider: [facility], Office Persons participating in the virtual visit: Patient, Provider, CMA   I discussed the limitations of evaluation and management by telemedicine and the availability of in person appointments. The patient expressed understanding and agreed to proceed.  Subjective:   HPI:   64 year old female presents via video visit with concerns of an itchy rash x 2-3 weeks after keeping her grandchildren. She reports the rash being very itchy all over her body, chest, back, between the fingers and toes. She has been applying creams to it without much relief. 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  ROS:   See pertinent positives and negatives per HPI.  Patient Active Problem List   Diagnosis Date Noted  . Right hand pain 10/12/2019  . Varicella without complication 09/98/3382  . Rash 04/27/2019  . Complicated grief 50/53/9767  . Diverticulitis 05/15/2018  . Epigastric pain 06/11/2017  . Morbid obesity ([place]) 03/19/2016  . History of TIA (transient ischemic attack) 03/14/2015  . Sinusitis 11/15/2013  . Routine general medical examination at a health care facility 11/23/2012  . Low back pain 11/02/2012  . Neuralgia 03/16/2012  . GERD 06/14/2009  . Essential hypertension 06/16/2008  . Hyperlipidemia 12/29/2007  . Hyperglycemia 11/26/2007  . Mild intermittent asthma 11/26/2007    Social History   Tobacco Use  . Smoking status: Former Smoker    Quit date: 09/12/2005    Years since quitting: 14.4  . Smokeless tobacco: Never Used  Substance Use Topics  . Alcohol use: Yes    Alcohol/week: 0.0 standard drinks    Comment: occasionally    Current Outpatient Medications:  .  albuterol (VENTOLIN  HFA) 108 (90 Base) MCG/ACT inhaler, Inhale 2 puffs into the lungs every 4 (four) hours as needed for wheezing or shortness of breath., Disp: 1 Inhaler, Rfl: 1 .  albuterol (VENTOLIN HFA) 108 (90 Base) MCG/ACT inhaler, Ventolin HFA 90 mcg/actuation aerosol inhaler  INL 2 PFS ITL Q 4 H PRF WHZ OR SOB, Disp: , Rfl:  .  azithromycin (ZITHROMAX) 250 MG tablet, Day 1 take 2 pills, days 2-5 take 1 pill daily., Disp: 6 tablet, Rfl: 0 .  cetirizine (ZYRTEC) 10 MG tablet, TAKE 1 TABLET BY MOUTH AT BEDTIME, Disp: 90 tablet, Rfl: 3 .  Evolocumab (REPATHA SURECLICK) 341 MG/ML SOAJ, Inject 140 mg into the skin every 14 (fourteen) days., Disp: 2 pen, Rfl: 11 .  Evolocumab (REPATHA SURECLICK) 937 MG/ML SOAJ, Repatha SureClick 902 mg/mL subcutaneous pen injector  ADMINISTER 1 ML UNDER THE SKIN EVERY 14 DAYS, Disp: , Rfl:  .  fluticasone (FLONASE) 50 MCG/ACT nasal spray, SHAKE Liquid and Use 2 sprays in each nostril twice a day, Disp: , Rfl: 5 .  hydrOXYzine (VISTARIL) 25 MG capsule, Take 1 capsule (25 mg total) by mouth every 6 (six) hours as needed for itching., Disp: 30 capsule, Rfl: 0 .  loperamide (IMODIUM) 2 MG capsule, Take 1 capsule (2 mg total) by mouth 4 (four) times daily as needed for diarrhea or loose stools., Disp: 12 capsule, Rfl: 0 .  metroNIDAZOLE (FLAGYL) 250 MG tablet, , Disp: , Rfl: 0 .  montelukast (SINGULAIR) 10 MG tablet, Take 1 tablet (10 mg total) by mouth at bedtime.,  Disp: 90 tablet, Rfl: 3 .  Nutritional Supplements (JUICE PLUS FIBRE PO), Take 4 each by mouth daily., Disp: , Rfl:  .  ondansetron (ZOFRAN) 8 MG tablet, , Disp: , Rfl:  .  ondansetron (ZOFRAN ODT) 4 MG disintegrating tablet, Take 1 tablet (4 mg total) by mouth every 6 (six) hours as needed., Disp: 20 tablet, Rfl: 0 .  permethrin (ELIMITE) 5 % cream, Apply 1 application topically once for 1 dose., Disp: 1 g, Rfl: 0 .  temazepam (RESTORIL) 7.5 MG capsule, Take 1-2 capsules (7.5-15 mg total) by mouth at bedtime as needed for  sleep., Disp: 30 capsule, Rfl: 0  Allergies  Allergen Reactions  . Doxycycline Diarrhea and Nausea Only    Nausea and diarrhea started a few hours after taking medication.  Diarrhea and nausea continue 2 days after discontinuation.  . Hydrocodone-Homatropine Itching, Anxiety and Other (See Comments)    Made pt confused   . Penicillins Shortness Of Breath and Swelling    TONGUE SWELLING Has patient had a PCN reaction causing immediate rash, facial/tongue/throat swelling, SOB or lightheadedness with hypotension: Unknown Has patient had a PCN reaction causing severe rash involving mucus membranes or skin necrosis: Unknown Has patient had a PCN reaction that required hospitalization: Was already in the hospital when happened Has patient had a PCN reaction occurring within the last 10 years: Yes able to take Augmentin If all of the above answers are "NO", then may proc  . Promethazine Hcl Shortness Of Breath  . Clarithromycin Nausea And Vomiting  . Statins Other (See Comments)    LEG CRAMPS    Objective:   There were no vitals taken for this visit.  Patient is well-developed, well-nourished in no acute distress.  Resting comfortably at home.  Head is normocephalic, atraumatic.  No labored breathing.  Speech is clear and coherent with logical content.  Patient is alert and oriented at baseline.   Assessment and Plan:   [name] was seen today for acute visit.  Diagnoses and all orders for this visit:  Scabies infestation  Other orders -     permethrin (ELIMITE) 5 % cream; Apply 1 application topically once for 1 dose.  Instructions on use provided as well as home care for laundry. Call the office with any questions or concerns. Recheck as needed and prn   Kennyth Arnold, [place] 02/28/2020

## 2020-02-29 ENCOUNTER — Telehealth: Payer: 59 | Admitting: Internal Medicine

## 2020-03-03 ENCOUNTER — Encounter: Payer: Self-pay | Admitting: Internal Medicine

## 2020-03-03 ENCOUNTER — Other Ambulatory Visit: Payer: Self-pay

## 2020-03-03 ENCOUNTER — Ambulatory Visit (INDEPENDENT_AMBULATORY_CARE_PROVIDER_SITE_OTHER): Payer: 59 | Admitting: Internal Medicine

## 2020-03-03 VITALS — BP 126/84 | HR 74 | Temp 98.7°F | Ht 63.0 in | Wt 260.0 lb

## 2020-03-03 DIAGNOSIS — I10 Essential (primary) hypertension: Secondary | ICD-10-CM

## 2020-03-03 DIAGNOSIS — Z0000 Encounter for general adult medical examination without abnormal findings: Secondary | ICD-10-CM

## 2020-03-03 DIAGNOSIS — R21 Rash and other nonspecific skin eruption: Secondary | ICD-10-CM

## 2020-03-03 DIAGNOSIS — E7800 Pure hypercholesterolemia, unspecified: Secondary | ICD-10-CM | POA: Diagnosis not present

## 2020-03-03 DIAGNOSIS — R739 Hyperglycemia, unspecified: Secondary | ICD-10-CM

## 2020-03-03 MED ORDER — TRIAMCINOLONE ACETONIDE 0.1 % EX CREA
1.0000 | TOPICAL_CREAM | Freq: Two times a day (BID) | CUTANEOUS | 0 refills | Status: DC
Start: 2020-03-03 — End: 2020-07-17

## 2020-03-03 MED ORDER — REPATHA SURECLICK 140 MG/ML ~~LOC~~ SOAJ: 140.0000 mg | SUBCUTANEOUS | 11 refills | Status: DC

## 2020-03-03 NOTE — Patient Instructions (Addendum)
We have sent in the refills.   Health Maintenance, Female Adopting a healthy lifestyle and getting preventive care are important in promoting health and wellness. Ask your health care provider about:  The right schedule for you to have regular tests and exams.  Things you can do on your own to prevent diseases and keep yourself healthy. What should I know about diet, weight, and exercise? Eat a healthy diet   Eat a diet that includes plenty of vegetables, fruits, low-fat dairy products, and lean protein.  Do not eat a lot of foods that are high in solid fats, added sugars, or sodium. Maintain a healthy weight Body mass index (BMI) is used to identify weight problems. It estimates body fat based on height and weight. Your health care provider can help determine your BMI and help you achieve or maintain a healthy weight. Get regular exercise Get regular exercise. This is one of the most important things you can do for your health. Most adults should:  Exercise for at least 150 minutes each week. The exercise should increase your heart rate and make you sweat (moderate-intensity exercise).  Do strengthening exercises at least twice a week. This is in addition to the moderate-intensity exercise.  Spend less time sitting. Even light physical activity can be beneficial. Watch cholesterol and blood lipids Have your blood tested for lipids and cholesterol at 62 years of age, then have this test every 5 years. Have your cholesterol levels checked more often if:  Your lipid or cholesterol levels are high.  You are older than 62 years of age.  You are at high risk for heart disease. What should I know about cancer screening? Depending on your health history and family history, you may need to have cancer screening at various ages. This may include screening for:  Breast cancer.  Cervical cancer.  Colorectal cancer.  Skin cancer.  Lung cancer. What should I know about heart disease,  diabetes, and high blood pressure? Blood pressure and heart disease  High blood pressure causes heart disease and increases the risk of stroke. This is more likely to develop in people who have high blood pressure readings, are of African descent, or are overweight.  Have your blood pressure checked: ? Every 3-5 years if you are 59-52 years of age. ? Every year if you are 58 years old or older. Diabetes Have regular diabetes screenings. This checks your fasting blood sugar level. Have the screening done:  Once every three years after age 85 if you are at a normal weight and have a low risk for diabetes.  More often and at a younger age if you are overweight or have a high risk for diabetes. What should I know about preventing infection? Hepatitis B If you have a higher risk for hepatitis B, you should be screened for this virus. Talk with your health care provider to find out if you are at risk for hepatitis B infection. Hepatitis C Testing is recommended for:  Everyone born from [birth year] through 1963.  Anyone with known risk factors for hepatitis C. Sexually transmitted infections (STIs)  Get screened for STIs, including gonorrhea and chlamydia, if: ? You are sexually active and are younger than 62 years of age. ? You are older than 62 years of age and your health care provider tells you that you are at risk for this type of infection. ? Your sexual activity has changed since you were last screened, and you are at increased risk for chlamydia  or gonorrhea. Ask your health care provider if you are at risk.  Ask your health care provider about whether you are at high risk for HIV. Your health care provider may recommend a prescription medicine to help prevent HIV infection. If you choose to take medicine to prevent HIV, you should first get tested for HIV. You should then be tested every 3 months for as long as you are taking the medicine. Pregnancy  If you are about to stop having your  period (premenopausal) and you may become pregnant, seek counseling before you get pregnant.  Take 400 to 800 micrograms (mcg) of folic acid every day if you become pregnant.  Ask for birth control (contraception) if you want to prevent pregnancy. Osteoporosis and menopause Osteoporosis is a disease in which the bones lose minerals and strength with aging. This can result in bone fractures. If you are 23 years old or older, or if you are at risk for osteoporosis and fractures, ask your health care provider if you should:  Be screened for bone loss.  Take a calcium or vitamin D supplement to lower your risk of fractures.  Be given hormone replacement therapy (HRT) to treat symptoms of menopause. Follow these instructions at home: Lifestyle  Do not use any products that contain nicotine or tobacco, such as cigarettes, e-cigarettes, and chewing tobacco. If you need help quitting, ask your health care provider.  Do not use street drugs.  Do not share needles.  Ask your health care provider for help if you need support or information about quitting drugs. Alcohol use  Do not drink alcohol if: ? Your health care provider tells you not to drink. ? You are pregnant, may be pregnant, or are planning to become pregnant.  If you drink alcohol: ? Limit how much you use to 0-1 drink a day. ? Limit intake if you are breastfeeding.  Be aware of how much alcohol is in your drink. In the [place] one drink equals one 12 oz bottle of beer (355 mL), one 5 oz glass of wine (148 mL), or one 1 oz glass of hard liquor (44 mL). General instructions  Schedule regular health, dental, and eye exams.  Stay current with your vaccines.  Tell your health care provider if: ? You often feel depressed. ? You have ever been abused or do not feel safe at home. Summary  Adopting a healthy lifestyle and getting preventive care are important in promoting health and wellness.  Follow your health care provider's  instructions about healthy diet, exercising, and getting tested or screened for diseases.  Follow your health care provider's instructions on monitoring your cholesterol and blood pressure. This information is not intended to replace advice given to you by your health care provider. Make sure you discuss any questions you have with your health care provider. Document Revised: 07/15/2018 Document Reviewed: 07/15/2018 Elsevier Patient Education  2020 [employer].

## 2020-03-03 NOTE — Assessment & Plan Note (Signed)
Weight stable.

## 2020-03-03 NOTE — Assessment & Plan Note (Signed)
Rx triamcinolone ointment.  

## 2020-03-03 NOTE — Progress Notes (Signed)
   Subjective:   Patient ID: [name], female    DOB: 1956-01-08, 64 y.o.   MRN: [id number]  HPI The patient is a 64 YO female coming in for physical. Off repatha due to being out for some time.  PMH, [facility], social history reviewed and updated  Review of Systems  Constitutional: Negative.   HENT: Negative.   Eyes: Negative.   Respiratory: Negative for cough, chest tightness and shortness of breath.   Cardiovascular: Negative for chest pain, palpitations and leg swelling.  Gastrointestinal: Negative for abdominal distention, abdominal pain, constipation, diarrhea, nausea and vomiting.  Musculoskeletal: Negative.   Skin: Negative.   Neurological: Negative.   Psychiatric/Behavioral: Negative.     Objective:  Physical Exam Constitutional:      Appearance: She is well-developed.  HENT:     Head: Normocephalic and atraumatic.  Cardiovascular:     Rate and Rhythm: Normal rate and regular rhythm.  Pulmonary:     Effort: Pulmonary effort is normal. No respiratory distress.     Breath sounds: Normal breath sounds. No wheezing or rales.  Abdominal:     General: Bowel sounds are normal. There is no distension.     Palpations: Abdomen is soft.     Tenderness: There is no abdominal tenderness. There is no rebound.  Musculoskeletal:     Cervical back: Normal range of motion.  Skin:    General: Skin is warm and dry.  Neurological:     Mental Status: She is alert and oriented to person, place, and time.     Coordination: Coordination normal.     Vitals:   03/03/20 0801  BP: 126/84  Pulse: 74  Temp: 98.7 F (37.1 C)  TempSrc: Oral  SpO2: 98%  Weight: (!) 260 lb (117.9 kg)  Height: 5\' 3"  (1.6 m)    This visit occurred during the SARS-CoV-2 public health emergency.  Safety protocols were in place, including screening questions prior to the visit, additional usage of staff PPE, and extensive cleaning of exam room while observing appropriate contact time as indicated for  disinfecting solutions.   Assessment & Plan:

## 2020-03-03 NOTE — Assessment & Plan Note (Signed)
Off repatha, refilled today and checking lipid panel.

## 2020-03-03 NOTE — Assessment & Plan Note (Signed)
Flu shot yearly. Covid-19 up to date. Shingrix counseled. Tetanus up to date. Colonoscopy up to date. Mammogram up to date, pap smear up to date. Counseled about sun safety and mole surveillance. Counseled about the dangers of distracted driving. Given 10 year screening recommendations.

## 2020-03-03 NOTE — Assessment & Plan Note (Signed)
Checking Hga1c and adjust as needed.  

## 2020-03-03 NOTE — Assessment & Plan Note (Signed)
BP at goal off meds. Continue monitoring.

## 2020-03-04 LAB — COMPREHENSIVE METABOLIC PANEL
AG Ratio: 1.8 (calc) (ref 1.0–2.5)
ALT: 21 U/L (ref 6–29)
AST: 17 U/L (ref 10–35)
Albumin: 4.7 g/dL (ref 3.6–5.1)
Alkaline phosphatase (APISO): 92 U/L (ref 37–153)
BUN: 19 mg/dL (ref 7–25)
CO2: 28 mmol/L (ref 20–32)
Calcium: 9.9 mg/dL (ref 8.6–10.4)
Chloride: 105 mmol/L (ref 98–110)
Creat: 0.86 mg/dL (ref 0.50–0.99)
Globulin: 2.6 g/dL (calc) (ref 1.9–3.7)
Glucose, Bld: 115 mg/dL — ABNORMAL HIGH (ref 65–99)
Potassium: 4.8 mmol/L (ref 3.5–5.3)
Sodium: 139 mmol/L (ref 135–146)
Total Bilirubin: 0.4 mg/dL (ref 0.2–1.2)
Total Protein: 7.3 g/dL (ref 6.1–8.1)

## 2020-03-04 LAB — CBC
HCT: 41.6 % (ref 35.0–45.0)
Hemoglobin: 13.6 g/dL (ref 11.7–15.5)
MCH: 28.9 pg (ref 27.0–33.0)
MCHC: 32.7 g/dL (ref 32.0–36.0)
MCV: 88.3 fL (ref 80.0–100.0)
MPV: 11.3 fL (ref 7.5–12.5)
Platelets: 229 10*3/uL (ref 140–400)
RBC: 4.71 10*6/uL (ref 3.80–5.10)
RDW: 13.4 % (ref 11.0–15.0)
WBC: 6.4 10*3/uL (ref 3.8–10.8)

## 2020-03-04 LAB — LIPID PANEL
Cholesterol: 217 mg/dL — ABNORMAL HIGH (ref <200)
HDL: 52 mg/dL (ref >=50)
LDL Cholesterol (Calc): 135 mg/dL (calc) — ABNORMAL HIGH
Non-HDL Cholesterol (Calc): 165 mg/dL (calc) — ABNORMAL HIGH (ref <130)
Total CHOL/HDL Ratio: 4.2 (calc) (ref <5.0)
Triglycerides: 162 mg/dL — ABNORMAL HIGH (ref <150)

## 2020-03-04 LAB — HEMOGLOBIN A1C
Hgb A1c MFr Bld: 5.7 % of total Hgb — ABNORMAL HIGH (ref <5.7)
Mean Plasma Glucose: 117 (calc)
eAG (mmol/L): 6.5 (calc)

## 2020-03-23 ENCOUNTER — Telehealth: Payer: Self-pay | Admitting: *Deleted

## 2020-03-23 NOTE — Telephone Encounter (Signed)
A message was left, re: her follow up visit. 

## 2020-05-19 LAB — HM DIABETES EYE EXAM

## 2020-06-11 NOTE — Progress Notes (Deleted)
Cardiology Office Note:    Date:  06/15/2020   ID:  [name], DOB [birth year], MRN [id number]  PCP:  Hoyt Koch, MD  Cardiologist:  Kirk Ruths, MD  Electrophysiologist:  None   Referring MD: Hoyt Koch, *   Chief Complaint: follow-up of hyperlipidemia  History of Present Illness:    [name] is a 64 y.o. female with a history of normal coronaries on cardiac catheterization in 06/2016, hypertension, hyperlipidemia, TIA in 2004, asthma, and morbid obesity who is followed by Dr. [name] and presents today for routine follow-up.  Patient was first seen by Dr. [name] in 04/2016 for evaluation of hyperlipidemia. However, at that visit she also reported occasional chest heaviness with exertion. Nuclear stress test was ordered and showed small defect in the apical lateral location consistent with ischemia but was overall low risk. Cardiac catheterization was then performed and showed normal coronary arteries with LVEF of 55-65%. Patient was last seen by Dr. [name] in 04/2018 at which time she noted dyspnea with more extreme activities but not with routine activities. No associated chest pain. Dyspnea was felt to be due to deconditioning and obesity hypoventilation syndrome. She was advised to follow-up in 1 year.  Patient presents today for follow-up. ***  Hyperlipidemia - Most recent lipid panel from 02/2020: Total Cholesterol 217, Triglycerides 162, HDL 52, LDL 135.  - Intolerant to statins in the past. - Patient had been off [place] for a while at time of last check. This has been restarted and is being managed by PCP. ***  Hypertension ***  History of Chest Pain  Past Medical History:  Diagnosis Date  . Allergy   . Arthritis    neck, knees, ankles, feet, back  . Asthma    prn inhaler  . Dental crowns present   . Epigastric pain 06/11/2017  . Essential hypertension 06/16/2008   Qualifier: Diagnosis of  By: Ronnald Ramp MD, [place] Right.   . 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 History of TIA (transient ischemic attack) 2004  . Hyperglycemia 11/26/2007   Qualifier: Diagnosis of  By: Larose Kells MD, [place] Hyperlipidemia   . Hypertension   . Limited joint range of motion    neck - states has bone spurs C2-5  . Mild intermittent asthma 11/26/2007      . Morbid obesity ([place]) 03/19/2016  . TMJ syndrome     Past Surgical History:  Procedure Laterality Date  . ACROMIONECTOMY Left 08/18/2008  . APPENDECTOMY  1979  . BUNIONECTOMY Left 07/17/2007  . BUNIONECTOMY Right 05/26/2014   Procedure: RIGHT FOOT LAPIDUS BUNION CORRECTION AMD MODIFIED MCBRIDE BUNIONECTOMY;  Surgeon: Wylene Simmer, MD;  Location: [facility];  Service: Orthopedics;  Laterality: Right;  . CARDIAC CATHETERIZATION N/A 06/28/2016   Procedure: Left Heart Cath and Coronary Angiography;  Surgeon: Peter M Martinique, MD;  Location: [facility];  Service: Cardiovascular;  Laterality: N/A;  . CESAREAN SECTION  1986  . correction of bunionectomy  2016   right foot   . GANGLION CYST EXCISION     wrist  . NASAL SEPTUM SURGERY  1981  . ROTATOR CUFF REPAIR W/ DISTAL CLAVICLE EXCISION Left 08/18/2008  . SHOULDER ARTHROSCOPY Left 06/07/2008  . thumb surgery  07-28-2015   left thumb knuckle collasped and had surgery   . TONSILLECTOMY  1964  . TUBAL LIGATION  1999    Current Medications: No outpatient medications have been marked as taking for the 06/21/20 encounter (  Appointment) with Darreld Mclean, PA-C.     Allergies:   Doxycycline, Hydrocodone-homatropine, Penicillins, Promethazine hcl, Clarithromycin, and Statins   Social History   Socioeconomic History  . Marital status: Married    Spouse name: Not on file  . Number of children: 2  . Years of education: Not on file  . Highest education level: Not on file  Occupational History  . Not on file  Tobacco Use  . Smoking status: Former Smoker    Quit date: 09/12/2005    Years since quitting: 14.7  .  Smokeless tobacco: Never Used  Vaping Use  . Vaping Use: Never used  Substance and Sexual Activity  . Alcohol use: Yes    Alcohol/week: 0.0 standard drinks    Comment: occasionally  . Drug use: No  . Sexual activity: Not on file  Other Topics Concern  . Not on file  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[employer]   Financial Resource Strain:   . Difficulty of Paying Living Expenses: Not on file  Food Insecurity:   . Worried About Charity fundraiser in the Last Year: Not on file  . Ran Out of Food in the Last Year: Not on file  Transportation Needs:   . Lack of Transportation (Medical): Not on file  . Lack of Transportation (Non-Medical): Not on file  Physical Activity:   . Days of Exercise per Week: Not on file  . Minutes of Exercise per Session: Not on file  Stress:   . Feeling of Stress : Not on file  Social Connections:   . Frequency of Communication with Friends and Family: Not on file  . Frequency of Social Gatherings with Friends and Family: Not on file  . Attends Religious Services: Not on file  . Active Member of Clubs or Organizations: Not on file  . Attends Archivist Meetings: Not on file  . Marital Status: Not on file     Family History: The patient's ***family history includes Atrial fibrillation in her father; Cancer in her brother and mother; Colon polyps in her mother; Diabetes in her mother; Heart disease in her paternal aunt; Thalassemia in her maternal aunt, mother, and sister. There is no history of Colon cancer, Rectal cancer, or Stomach cancer.  ROS:   Please see the history of present illness.    *** All other systems reviewed and are negative.  EKGs/Labs/Other Studies Reviewed:    The following studies were reviewed today:  Cardiac Catheterization 06/28/2020:   The left ventricular systolic function is normal.  LV end diastolic pressure is normal.  The left ventricular ejection fraction is 55-65% by visual  estimate.   1. Normal coronary anatomy 2. Normal LV function  Plan: risk factor modification.   EKG:  EKG *** ordered today. EKG personally reviewed and demonstrates ***.  Recent Labs: 03/03/2020: ALT 21; BUN 19; Creat 0.86; Hemoglobin 13.6; Platelets 229; Potassium 4.8; Sodium 139  Recent Lipid Panel    Component Value Date/Time   CHOL 217 (H) 03/03/2020 0824   CHOL 255 (H) 03/14/2015 0921   TRIG 162 (H) 03/03/2020 0824   TRIG 217 (H) 03/14/2015 0921   HDL 52 03/03/2020 0824   HDL 49 03/14/2015 0921   CHOLHDL 4.2 03/03/2020 0824   VLDL 68.4 (H) 03/01/2019 1206   LDLCALC 135 (H) 03/03/2020 0824   LDLCALC 163 (H) 03/14/2015 0921   LDLDIRECT 78.0 03/01/2019 1206    Physical Exam:  Vital Signs: There were no vitals taken for this visit.    Wt Readings from Last 3 Encounters:  03/03/20 (!) 260 lb (117.9 kg)  01/31/20 259 lb (117.5 kg)  10/12/19 261 lb (118.4 kg)     General: 64 y.o. female in no acute distress. HEENT: Normocephalic and atraumatic. Sclera clear. EOMs intact. Neck: Supple. No carotid bruits. No JVD. Heart: *** RRR. Distinct S1 and S2. No murmurs, gallops, or rubs. Radial and distal pedal pulses 2+ and equal bilaterally. Lungs: No increased work of breathing. Clear to ausculation bilaterally. No wheezes, rhonchi, or rales.  Abdomen: Soft, non-distended, and non-tender to palpation. Bowel sounds present in all 4 quadrants.  MSK: Normal strength and tone for age. *** Extremities: No lower extremity edema.    Skin: Warm and dry. Neuro: Alert and oriented x3. No focal deficits. Psych: Normal affect. Responds appropriately.   Assessment:    No diagnosis found.  Plan:     Disposition: Follow up in ***   Medication Adjustments/Labs and Tests Ordered: Current medicines are reviewed at length with the patient today.  Concerns regarding medicines are outlined above.  No orders of the defined types were placed in this encounter.  No orders of the  defined types were placed in this encounter.   There are no Patient Instructions on file for this visit.   Signed, Darreld Mclean, PA-C  06/15/2020 7:18 PM    [facility]

## 2020-06-21 ENCOUNTER — Ambulatory Visit: Payer: 59 | Admitting: Student

## 2020-06-22 ENCOUNTER — Telehealth: Payer: 59 | Admitting: Family Medicine

## 2020-06-22 DIAGNOSIS — J45901 Unspecified asthma with (acute) exacerbation: Secondary | ICD-10-CM

## 2020-06-22 DIAGNOSIS — R0981 Nasal congestion: Secondary | ICD-10-CM | POA: Diagnosis not present

## 2020-06-22 DIAGNOSIS — R059 Cough, unspecified: Secondary | ICD-10-CM | POA: Diagnosis not present

## 2020-06-22 MED ORDER — PREDNISONE 20 MG PO TABS: 40.0000 mg | ORAL_TABLET | Freq: Every day | ORAL | 0 refills | Status: DC

## 2020-06-22 MED ORDER — BENZONATATE 100 MG PO CAPS: 100.0000 mg | ORAL_CAPSULE | Freq: Three times a day (TID) | ORAL | 0 refills | Status: DC | PRN

## 2020-06-22 NOTE — Progress Notes (Signed)
Virtual Visit via Video Note  I connected with [name]  on 06/22/20 at 12:40 PM EST by a video enabled telemedicine application and verified that I am speaking with the correct person using two identifiers.  Location patient: home, [place] Location provider:work or home office Persons participating in the virtual visit: patient, provider  I discussed the limitations of evaluation and management by telemedicine and the availability of in person appointments. The patient expressed understanding and agreed to proceed.   HPI:  Acute telemedicine visit for sinus issues: -Onset: 3 days ago -Symptoms include: sore throat, cough, sinus congestion, ears feel full, some wheezing/SOB - reports normal with her asthma "every day of my life"- responds to alb but feels like not as well now being sick -neg covid test 2 days ago, reports has done 3 tests - all negative -Denies: fevers, NVD -Has tried: cough syrup she had at home -Pertinent past medical history: dental implant last week - some mouth soreness and swelling after (saw her dental surgeon 3 days ago for this and is clindamycin and also valtrex for " a viral mouth infection", chlorhexidine mouth wash and lidocaine mouth wash; reports hx of asthma  usually requires prednisone when she has the symptoms. -also taking Singulair, zyrtec, alb prn -COVID-19 vaccine status: fully vaccinated for COVID19, not for flu  ROS: See pertinent positives and negatives per HPI.  Past Medical History:  Diagnosis Date  . Allergy   . Arthritis    neck, knees, ankles, feet, back  . Asthma    prn inhaler  . Dental crowns present   . Epigastric pain 06/11/2017  . Essential hypertension 06/16/2008   Qualifier: Diagnosis of  By: Ronnald Ramp MD, [place] Right.   . Hallux valgus of right foot 05/2014  . History of TIA (transient ischemic attack) 2004  . Hyperglycemia 11/26/2007   Qualifier: Diagnosis of  By: Larose Kells MD, [place] Hyperlipidemia   . Hypertension   . Limited  joint range of motion    neck - states has bone spurs C2-5  . Mild intermittent asthma 11/26/2007      . Morbid obesity ([place]) 03/19/2016  . TMJ syndrome     Past Surgical History:  Procedure Laterality Date  . ACROMIONECTOMY Left 08/18/2008  . APPENDECTOMY  1979  . BUNIONECTOMY Left 07/17/2007  . BUNIONECTOMY Right 05/26/2014   Procedure: RIGHT FOOT LAPIDUS BUNION CORRECTION AMD MODIFIED MCBRIDE BUNIONECTOMY;  Surgeon: Wylene Simmer, MD;  Location: [facility];  Service: Orthopedics;  Laterality: Right;  . CARDIAC CATHETERIZATION N/A 06/28/2016   Procedure: Left Heart Cath and Coronary Angiography;  Surgeon: Peter M Martinique, MD;  Location: [facility];  Service: Cardiovascular;  Laterality: N/A;  . CESAREAN SECTION  1986  . correction of bunionectomy  2016   right foot   . GANGLION CYST EXCISION     wrist  . NASAL SEPTUM SURGERY  1981  . ROTATOR CUFF REPAIR W/ DISTAL CLAVICLE EXCISION Left 08/18/2008  . SHOULDER ARTHROSCOPY Left 06/07/2008  . thumb surgery  07-28-2015   left thumb knuckle collasped and had surgery   . TONSILLECTOMY  1964  . TUBAL LIGATION  1999     Current Outpatient Medications:  .  albuterol (VENTOLIN HFA) 108 (90 Base) MCG/ACT inhaler, Inhale 2 puffs into the lungs every 4 (four) hours as needed for wheezing or shortness of breath., Disp: 1 Inhaler, Rfl: 1 .  benzonatate (TESSALON PERLES) 100 MG capsule, Take 1 capsule (100 mg total) by  mouth 3 (three) times daily as needed., Disp: 20 capsule, Rfl: 0 .  cetirizine (ZYRTEC) 10 MG tablet, TAKE 1 TABLET BY MOUTH AT BEDTIME, Disp: 90 tablet, Rfl: 3 .  Evolocumab (REPATHA SURECLICK) 923 MG/ML SOAJ, Inject 140 mg into the skin every 14 (fourteen) days., Disp: 2 pen, Rfl: 11 .  fluticasone (FLONASE) 50 MCG/ACT nasal spray, SHAKE Liquid and Use 2 sprays in each nostril twice a day, Disp: , Rfl: 5 .  hydrOXYzine (VISTARIL) 25 MG capsule, Take 1 capsule (25 mg total) by mouth every 6 (six) hours as needed  for itching., Disp: 30 capsule, Rfl: 0 .  loperamide (IMODIUM) 2 MG capsule, Take 1 capsule (2 mg total) by mouth 4 (four) times daily as needed for diarrhea or loose stools., Disp: 12 capsule, Rfl: 0 .  metroNIDAZOLE (FLAGYL) 250 MG tablet, , Disp: , Rfl: 0 .  montelukast (SINGULAIR) 10 MG tablet, Take 1 tablet (10 mg total) by mouth at bedtime., Disp: 90 tablet, Rfl: 3 .  Nutritional Supplements (JUICE PLUS FIBRE PO), Take 4 each by mouth daily., Disp: , Rfl:  .  predniSONE (DELTASONE) 20 MG tablet, Take 2 tablets (40 mg total) by mouth daily with breakfast., Disp: 8 tablet, Rfl: 0 .  triamcinolone cream (KENALOG) 0.1 %, Apply 1 application topically 2 (two) times daily., Disp: 100 g, Rfl: 0  EXAM:  VITALS per patient if applicable:  GENERAL: alert, oriented, appears well and in no acute distress  HEENT: atraumatic, conjunttiva clear, no obvious abnormalities on inspection of external nose and ears  NECK: normal movements of the head and neck  LUNGS: on inspection no signs of respiratory distress, breathing rate appears normal, no obvious gross SOB, gasping or wheezing  CV: no obvious cyanosis  MS: moves all visible extremities without noticeable abnormality  PSYCH/NEURO: pleasant and cooperative, no obvious depression or anxiety, speech and thought processing grossly intact  ASSESSMENT AND PLAN:  Discussed the following assessment and plan:  Sinus congestion  Cough  Asthma with acute exacerbation, unspecified asthma severity, unspecified whether persistent  -we discussed possible serious and likely etiologies, options for evaluation and workup, limitations of telemedicine visit vs in person visit, treatment, treatment risks and precautions. Pt prefers to treat via telemedicine empirically rather than in person at this moment.  She reports she has done a number of negative Covid test which were all negative.  Suspect viral upper respiratory illness, possibly with some bronchitis  given her underlying asthma.  She opted for treatment with Tessalon for cough and a prednisone burst.  She is already on an antibiotic from her recent dental surgery.  She will continue to use her albuterol as needed.  .  Advised to seek prompt in person care if worsening, new symptoms arise, or if is not improving with treatment. Discussed options for inperson care if PCP office not available. Did let this patient know that I only do telemedicine on Tuesdays and Thursdays for [place]. Advised to schedule follow up visit with PCP or [facility] if any further questions or concerns to avoid delays in care.   I discussed the assessment and treatment plan with the patient. The patient was provided an opportunity to ask questions and all were answered. The patient agreed with the plan and demonstrated an understanding of the instructions.     Lucretia Kern, DO

## 2020-06-22 NOTE — Patient Instructions (Signed)
-  I sent the medication(s) we discussed to your pharmacy: Meds ordered this encounter  Medications  . benzonatate (TESSALON PERLES) 100 MG capsule    Sig: Take 1 capsule (100 mg total) by mouth 3 (three) times daily as needed.    Dispense:  20 capsule    Refill:  0  . predniSONE (DELTASONE) 20 MG tablet    Sig: Take 2 tablets (40 mg total) by mouth daily with breakfast.    Dispense:  8 tablet    Refill:  0     I hope you are feeling better soon!  Seek in person care promptly if your symptoms worsen, new concerns arise or you are not improving with treatment.  It was nice to meet you today. I help [facility] out with telemedicine visits on Tuesdays and Thursdays and am available for visits on those days. If you have any concerns or questions following this visit please schedule a follow up visit with your Primary Care doctor or seek care at a local urgent care clinic to avoid delays in care.

## 2020-06-25 ENCOUNTER — Emergency Department (HOSPITAL_COMMUNITY)
Admit: 2020-06-25 | Discharge: 2020-06-25 | Disposition: A | Discharge status: Home / Self Care | Payer: 59 | Attending: Plastic Surgery | Admitting: Plastic Surgery

## 2020-06-25 ENCOUNTER — Other Ambulatory Visit (HOSPITAL_COMMUNITY): Payer: Self-pay | Admitting: Plastic Surgery

## 2020-06-25 ENCOUNTER — Emergency Department (HOSPITAL_COMMUNITY)
Admit: 2020-06-25 | Discharge: 2020-06-25 | Disposition: A | Discharge status: Home / Self Care | Payer: 59 | Source: Home / Self Care

## 2020-06-25 ENCOUNTER — Other Ambulatory Visit: Payer: Self-pay

## 2020-06-25 DIAGNOSIS — R053 Chronic cough: Secondary | ICD-10-CM

## 2020-07-09 ENCOUNTER — Encounter: Payer: Self-pay | Admitting: Internal Medicine

## 2020-07-10 ENCOUNTER — Telehealth (INDEPENDENT_AMBULATORY_CARE_PROVIDER_SITE_OTHER): Payer: 59 | Admitting: Internal Medicine

## 2020-07-10 ENCOUNTER — Encounter: Payer: Self-pay | Admitting: Internal Medicine

## 2020-07-10 ENCOUNTER — Other Ambulatory Visit: Payer: Self-pay

## 2020-07-10 DIAGNOSIS — J029 Acute pharyngitis, unspecified: Secondary | ICD-10-CM | POA: Diagnosis not present

## 2020-07-10 NOTE — Progress Notes (Signed)
Virtual Visit via Video Note  I connected with [name] on 07/10/20 at 10:00 AM EST by a video enabled telemedicine application and verified that I am speaking with the correct person using two identifiers.  The patient and the provider were at separate locations throughout the entire encounter. Patient location: home, Provider location: work   I discussed the limitations of evaluation and management by telemedicine and the availability of in person appointments. The patient expressed understanding and agreed to proceed. The patient and the provider were the only parties present for the visit unless noted in HPI below.  History of Present Illness: The patient is a 64 y.o. female with visit for sore throat. Started with dental procedure on Nov 11th with an implant. She woke up next day with pain and swelling at injection site for novocaine. Pain in the throat both sides extremely painful. Covid-19 negative on Nov 13th and 14th negative. She was given clindamycin mouth wash which she took. Went into dentist on 15th. They saw some white patches/blisters and was put on valtrex. Called them again on the 18th that she was no better. Seen via telehealth 06/22/20 and given prednisone and tessalon perles. Another covid-19 test that day which was negative. Did course of azithromcyin at that time. Has had more covid-19 test on 24, 25th negative at that time. Got CXR which was normal. Sore throat was gone on the 30th but still had cough and got another covid-19 test. Still having cough which was productive. Dec 4th called and talked to nurse line for sore throat left side which started back around Friday. She has been using albuterol more often lately.   Observations/Objective: Voice strong, A and O times 3, no coughing during visit, some nasal tones to voice  Assessment and Plan: See problem oriented charting  Follow Up Instructions: patient to come to office for in person visualization of the throat and  further treatment from there  I discussed the assessment and treatment plan with the patient. The patient was provided an opportunity to ask questions and all were answered. The patient agreed with the plan and demonstrated an understanding of the instructions.   The patient was advised to call back or seek an in-person evaluation if the symptoms worsen or if the condition fails to improve as anticipated.  Hoyt Koch, MD

## 2020-07-10 NOTE — Progress Notes (Signed)
Cardiology Office Note:    Date:  07/17/2020   ID:  [name], DOB 1956/01/22, MRN [id number]  PCP:  Hoyt Koch, MD  Cardiologist:  Kirk Ruths, MD  Electrophysiologist:  None   Referring MD: Hoyt Koch, *   Chief Complaint: recurrent falls with possible syncope  History of Present Illness:    [name] is a 64 y.o. female with a history of normal coronaries on cardiac cath in 06/2016, prior TIA, small stable pulmonary nodules on CT in 2019,  hypertension, hyperlipidemia intolerant to statins and on Repatha, asthma, and morbid obesity who is followed by Dr. [name] and presents today for routine follow-up.   Patient was referred to Dr. [name] in 04/2016 for evaluation of hyperlipidemia. At that visit, patient reported chronic dyspnea on exertion as well as chest heaviness. Nuclear stress test was ordered for further evaluation and showed small defect in apical lateral location consistent with ischemia. Cardiac catheterization was then recommended and this showed normal coronaries and normal LV function. In regards to her hyperlipidemia, patient has been intolerant to statins in the past and is now on Repatha.   Patient was last seen by Dr. [name] in 04/2018 at which time she continued to report dyspnea with more extreme activities but not with routine activities. She denied any chest pain.  Patient presents today for follow-up. She is here alone. Patient reports she has had recurrent falls over the last several months. She notes 6 falls since 10/2019 with the last fall occur in 05/2020 while she is a the beach. During some of these episodes, she has fallen down the steps. She denies any symptoms prior to fall and reports she just comes to on the ground and is not sure what caused her to falls. She does not even remember actually falling. She notes some lightheadedness/dizziness is she gets up too quickly in the morning but states she just gets up slowly and  states these episodes only last for a few seconds. She denies any palpitations, lightheadedness, dizziness prior to the falls. She is not sure if she has any LOC with these episodes although it sounds like she has. No seizure like activity. She went to see a Neurologist after last episode in 05/2020. Head CT was reportedly performed and was normal. She has chronic dyspnea with exertion which she attributes to her weight. She states this is stable. She does sleep on an incline which she has for several months and states this helps with her reflux. She has some chronic lower extremity that is worse if she eats high sodium diet but it otherwise stable. No chest pain.   Past Medical History:  Diagnosis Date  . Allergy   . Arthritis    neck, knees, ankles, feet, back  . Asthma    prn inhaler  . Dental crowns present   . Epigastric pain 06/11/2017  . Essential hypertension 06/16/2008   Qualifier: Diagnosis of  By: Ronnald Ramp MD, [place] Right.   . Hallux valgus of right foot 05/2014  . History of TIA (transient ischemic attack) 2004  . Hyperglycemia 11/26/2007   Qualifier: Diagnosis of  By: Larose Kells MD, [place] Hyperlipidemia   . Hypertension   . Limited joint range of motion    neck - states has bone spurs C2-5  . Mild intermittent asthma 11/26/2007      . Morbid obesity ([place]) 03/19/2016  . TMJ syndrome     Past Surgical History:  Procedure Laterality Date  .  ACROMIONECTOMY Left 08/18/2008  . APPENDECTOMY  1979  . BUNIONECTOMY Left 07/17/2007  . BUNIONECTOMY Right 05/26/2014   Procedure: RIGHT FOOT LAPIDUS BUNION CORRECTION AMD MODIFIED MCBRIDE BUNIONECTOMY;  Surgeon: Wylene Simmer, MD;  Location: [facility];  Service: Orthopedics;  Laterality: Right;  . CARDIAC CATHETERIZATION N/A 06/28/2016   Procedure: Left Heart Cath and Coronary Angiography;  Surgeon: Peter M Martinique, MD;  Location: [facility];  Service: Cardiovascular;  Laterality: N/A;  . CESAREAN SECTION  1986  .  correction of bunionectomy  2016   right foot   . GANGLION CYST EXCISION     wrist  . NASAL SEPTUM SURGERY  1981  . ROTATOR CUFF REPAIR W/ DISTAL CLAVICLE EXCISION Left 08/18/2008  . SHOULDER ARTHROSCOPY Left 06/07/2008  . thumb surgery  07-28-2015   left thumb knuckle collasped and had surgery   . TONSILLECTOMY  1964  . TUBAL LIGATION  1999    Current Medications: Current Meds  Medication Sig  . albuterol (VENTOLIN HFA) 108 (90 Base) MCG/ACT inhaler Inhale 2 puffs into the lungs every 4 (four) hours as needed for wheezing or shortness of breath.  . benzonatate (TESSALON PERLES) 100 MG capsule Take 1 capsule (100 mg total) by mouth 3 (three) times daily as needed.  . cetirizine (ZYRTEC) 10 MG tablet TAKE 1 TABLET BY MOUTH AT BEDTIME  . ciprofloxacin (CIPRO) 750 MG tablet Take 750 mg by mouth 2 (two) times daily.  . Evolocumab (REPATHA SURECLICK) 710 MG/ML SOAJ Inject 140 mg into the skin every 14 (fourteen) days.  . fluticasone (FLONASE) 50 MCG/ACT nasal spray SHAKE Liquid and Use 2 sprays in each nostril twice a day  . loperamide (IMODIUM) 2 MG capsule Take 1 capsule (2 mg total) by mouth 4 (four) times daily as needed for diarrhea or loose stools.  . metroNIDAZOLE (FLAGYL) 250 MG tablet   . montelukast (SINGULAIR) 10 MG tablet Take 1 tablet (10 mg total) by mouth at bedtime.  . Nutritional Supplements (JUICE PLUS FIBRE PO) Take 4 each by mouth daily.     Allergies:   Doxycycline, Hydrocodone-homatropine, Penicillins, Promethazine hcl, Clarithromycin, Statins, and Other   Social History   Socioeconomic History  . Marital status: Married    Spouse name: Not on file  . Number of children: 2  . Years of education: Not on file  . Highest education level: Not on file  Occupational History  . Not on file  Tobacco Use  . Smoking status: Former Smoker    Quit date: 09/12/2005    Years since quitting: 14.8  . Smokeless tobacco: Never Used  Vaping Use  . Vaping Use: Never used   Substance and Sexual Activity  . Alcohol use: Yes    Alcohol/week: 0.0 standard drinks    Comment: occasionally  . Drug use: No  . Sexual activity: Not on file  Other Topics Concern  . Not on file  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[employer]   Financial Resource Strain: Not on file  Food Insecurity: Not on file  Transportation Needs: Not on file  Physical Activity: Not on file  Stress: Not on file  Social Connections: Not on file     Family History: The patient's family history includes Atrial fibrillation in her father; Cancer in her brother and mother; Colon polyps in her mother; Diabetes in her mother; Heart disease in her paternal aunt; Thalassemia in her maternal aunt, mother, and sister. There is  no history of Colon cancer, Rectal cancer, or Stomach cancer.  ROS:   Please see the history of present illness.     EKGs/Labs/Other Studies Reviewed:    The following studies were reviewed today:  Myoview 06/19/2016:  Nuclear stress EF: 63%.  The left ventricular ejection fraction is normal (55-65%).  There was no ST segment deviation noted during stress.  Defect 1: There is a small defect of moderate severity present in the apical lateral location.  Findings consistent with ischemia.  This is a low risk study. _______________  Left Cardiac Catheterization 06/28/2016:  The left ventricular systolic function is normal.  LV end diastolic pressure is normal.  The left ventricular ejection fraction is 55-65% by visual estimate.   1. Normal coronary anatomy 2. Normal LV function  Plan: risk factor modification.  EKG:  EKG ordered today. EKG personally reviewed and demonstrates normal sinus rhythm, rate 78 bpm, with non-specific T wave flattening in leads III and aVF. Normal PR and QRS intervals. QTc 437 ms.   Recent Labs: 03/03/2020: ALT 21; BUN 19; Creat 0.86; Hemoglobin 13.6; Platelets 229; Potassium 4.8; Sodium 139  Recent Lipid  Panel    Component Value Date/Time   CHOL 217 (H) 03/03/2020 0824   CHOL 255 (H) 03/14/2015 0921   TRIG 162 (H) 03/03/2020 0824   TRIG 217 (H) 03/14/2015 0921   HDL 52 03/03/2020 0824   HDL 49 03/14/2015 0921   CHOLHDL 4.2 03/03/2020 0824   VLDL 68.4 (H) 03/01/2019 1206   LDLCALC 135 (H) 03/03/2020 0824   LDLCALC 163 (H) 03/14/2015 0921   LDLDIRECT 78.0 03/01/2019 1206    Physical Exam:    Vital Signs: BP 136/82   Pulse 78   Ht 5\' 3"  (1.6 m)   Wt 266 lb (120.7 kg)   SpO2 96%   BMI 47.12 kg/m     Wt Readings from Last 3 Encounters:  07/17/20 266 lb (120.7 kg)  03/03/20 (!) 260 lb (117.9 kg)  01/31/20 259 lb (117.5 kg)     General: 64 y.o. morbidly obese Caucasian female in no acute distress. HEENT: Normocephalic and atraumatic. Sclera clear. EOMs  Neck: Supple. No carotid bruits. No JVD. Heart: RRR. Distinct S1 and S2. No murmurs, gallops, or rubs. Radial pulses 2+ and equal bilaterally. Lungs: No increased work of breathing. Clear to ausculation bilaterally. No wheezes, rhonchi, or rales.  Abdomen: Soft, non-distended, and non-tender to palpation.  MSK: Normal strength and tone for age.  Extremities: Mild lower extremity edema bilaterally. Skin: Warm and dry. Neuro: Alert and oriented x3. No focal deficits. Psych: Normal affect. Responds appropriately.   Assessment:    1. Recurrent falls   2. Possible syncope   3. Chronic dyspnea   4. Chronic lower extremity edema   5. History of chest pain   6. Elevated BP without diagnosis of hypertension   7. Hyperlipidemia, unspecified hyperlipidemia type   8. Morbid obesity ([place])     Plan:    Recurrent Falls with Possible Syncope - Patient reports 6 falls since 10/2019. Do not sound mechanical in nature - she states she comes to on the ground and does not remember falling. No prodromal symptoms. It sounds like syncopal episodes but she states she is not sure if she has actually had any LOC. No seizure like activity. She  saw a Neurologist in 05/2020 after last episode and head CT was reportedly unremarkable.  - EKG today shows normal sinus rhythm with no acute ischemic changes. - Concerned  that arrhythmia may be causing these episodes. Will check Echo and 30-day Event Monitor.  - Will check routine CBC and BMET today.  - Discussed that per [place] law, patient should not drive for 6 months after unexplained syncope. Patient states she really is not sure whether she had any LOC. Her daughter currently has breast cancer and she states she cannot stop going to visit her. Again discussed that this is [place] law. Advised patient to let us know if she has any other episodes like this. And if she has any syncope, she should not drive.   Chronic Dyspnea on Exertion - Previously felt to be secondary to deconditioning and obesity hypoventilation syndrome.  - Stable.  - Will check Echo as stated above given possible syncope.  Chronic Lower Extremity Edema - History of chronic lower extremity edema. Mild edema on exam.  - Discussed importance of low sodium diet.  History of Chest Pain - History of chest pain. LHC in 06/2016 showed normal coronaries.  - No recurrent chest pain.   Elevated BP without Diagnosis of Hypertension - BP reasonably well controlled in the office today at 136/82. However, patient states BP is often very elevated at other office visits  - Asked patient to keep BP/HR log x2 weeks and notify us of these results. If BP consistently greater than goal of <130/80, will likely add HCTZ given chronic lower extremity edema.  Hyperlipidemia - Most recent lipid panel in 02/2020: Total Cholesterol 217, Triglycerides 162, HDL 52, and LDL 135.  - Continue Repatha. Managed by PCP.  Obesity - BMI 47.12. - Patient is aware that obesity is contributing to chronic dyspnea. She knows the importance of eating a health diet but unfortunately vegetables exacerbate her diverticulitis.  - Will need to continue to discuss  importance of weight loss. Was not able to spend time on this today given acute concerns above.   Disposition: Follow up in 2 months after Monitor and Echo.   Medication Adjustments/Labs and Tests Ordered: Current medicines are reviewed at length with the patient today.  Concerns regarding medicines are outlined above.  Orders Placed This Encounter  Procedures  . CBC  . Basic metabolic panel  . CARDIAC EVENT MONITOR  . EKG 12-Lead  . ECHOCARDIOGRAM COMPLETE   No orders of the defined types were placed in this encounter.   Patient Instructions  Medication Instructions:  No changes *If you need a refill on your cardiac medications before your next appointment, please call your pharmacy*   Lab Work: BMP, CBC If you have labs (blood work) drawn today and your tests are completely normal, you will receive your results only by: Marland Kitchen MyChart Message (if you have MyChart) OR . A paper copy in the mail If you have any lab test that is abnormal or we need to change your treatment, we will call you to review the results.   Testing/Procedures: [address] Your physician has requested that you have an echocardiogram. Echocardiography is a painless test that uses sound waves to create images of your heart. It provides your doctor with information about the size and shape of your heart and how well your heart's chambers and valves are working. This procedure takes approximately one hour. There are no restrictions for this procedure.  [employer] Cardiac Event Monitor Instructions Your physician has requested you wear your cardiac event monitor for _____ days, (1-30). [employer] may call or text to confirm a shipping address. The monitor will be sent to  a land address via [place]. [employer] will not ship a monitor to a PO BOX. It typically takes 3-5 days to receive your monitor after it has been enrolled. [employer] will assist with USPS tracking if your package is delayed. The  telephone number for [employer] is [phone]. Once you have received your monitor, please review the enclosed instructions. Instruction tutorials can also be viewed under help and settings on the enclosed cell phone. Your monitor has already been registered assigning a specific monitor serial # to you.  Applying the monitor Remove cell phone from case and turn it on. The cell phone works as Dealer and needs to be within [employer] of you at all times. The cell phone will need to be charged on a daily basis. We recommend you plug the cell phone into the enclosed charger at your bedside table every night.  Monitor batteries: You will receive two monitor batteries labelled #1 and #2. These are your recorders. Plug battery #2 onto the second connection on the enclosed charger. Keep one battery on the charger at all times. This will keep the monitor battery deactivated. It will also keep it fully charged for when you need to switch your monitor batteries. A small light will be blinking on the battery emblem when it is charging. The light on the battery emblem will remain on when the battery is fully charged.  Open package of a Monitor strip. Insert battery #1 into black hood on strip and gently squeeze monitor battery onto connection as indicated in instruction booklet. Set aside while preparing skin.  Choose location for your strip, vertical or horizontal, as indicated in the instruction booklet. Shave to remove all hair from location. There cannot be any lotions, oils, powders, or colognes on skin where monitor is to be applied. Wipe skin clean with enclosed Saline wipe. Dry skin completely.  Peel paper labeled #1 off the back of the Monitor strip exposing the adhesive. Place the monitor on the chest in the vertical or horizontal position shown in the instruction booklet. One arrow on the monitor strip must be pointing upward. Carefully remove paper labeled #2, attaching remainder  of strip to your skin. Try not to create any folds or wrinkles in the strip as you apply it.  Firmly press and release the circle in the center of the monitor battery. You will hear a small beep. This is turning the monitor battery on. The heart emblem on the monitor battery will light up every 5 seconds if the monitor battery in turned on and connected to the patient securely. Do not push and hold the circle down as this turns the monitor battery off. The cell phone will locate the monitor battery. A screen will appear on the cell phone checking the connection of your monitor strip. This may read poor connection initially but change to good connection within the next minute. Once your monitor accepts the connection you will hear a series of 3 beeps followed by a climbing crescendo of beeps. A screen will appear on the cell phone showing the two monitor strip placement options. Touch the picture that demonstrates where you applied the monitor strip.  Your monitor strip and battery are waterproof. You are able to shower, bathe, or swim with the monitor on. They just ask you do not submerge deeper than 3 feet underwater. We recommend removing the monitor if you are swimming in a lake, river, or ocean.  Your monitor battery will need to be switched to a  fully charged monitor battery approximately once a week. The cell phone will alert you of an action which needs to be made.  On the cell phone, tap for details to reveal connection status, monitor battery status, and cell phone battery status. The green dots indicates your monitor is in good status. A red dot indicates there is something that needs your attention.  To record a symptom, click the circle on the monitor battery. In 30-60 seconds a list of symptoms will appear on the cell phone. Select your symptom and tap save. Your monitor will record a sustained or significant arrhythmia regardless of you clicking the button. Some patients do not  feel the heart rhythm irregularities. [employer] will notify us of any serious or critical events.  Refer to instruction booklet for instructions on switching batteries, changing strips, the Do not disturb or Pause features, or any additional questions.  Call [employer] at [phone], to confirm your monitor is transmitting and record your baseline. They will answer any questions you may have regarding the monitor instructions at that time.  Returning the monitor to [place] all equipment back into blue box. Peel off strip of paper to expose adhesive and close box securely. There is a prepaid UPS shipping label on this box. Drop in a UPS drop box, or at a UPS facility like [employer]. You may also contact [employer] to arrange UPS to pick up monitor package at your home.   Follow-Up: At [facility], you and your health needs are our priority.  As part of our continuing mission to provide you with exceptional heart care, we have created designated Provider Care Teams.  These Care Teams include your primary Cardiologist (physician) and Advanced Practice Providers (APPs -  Physician Assistants and Nurse Practitioners) who all work together to provide you with the care you need, when you need it.    Your next appointment:   2 month(s)  The format for your next appointment:   In Person  Provider:   You may see Kirk Ruths, MDor one of the following Advanced Practice Providers on your designated Care Team:    [name], [place]    Other Instructions Log Blood Pressure and Heart Rate for4 2 weeks    Signed, Eppie Gibson  07/17/2020 10:59 AM    [place]

## 2020-07-11 DIAGNOSIS — J029 Acute pharyngitis, unspecified: Secondary | ICD-10-CM | POA: Insufficient documentation

## 2020-07-11 NOTE — Assessment & Plan Note (Signed)
Asked to come to office for in person evaluation of throat and treatment for same based on findings.

## 2020-07-17 ENCOUNTER — Encounter: Payer: Self-pay | Admitting: *Deleted

## 2020-07-17 ENCOUNTER — Encounter: Payer: Self-pay | Admitting: Student

## 2020-07-17 ENCOUNTER — Other Ambulatory Visit: Payer: Self-pay

## 2020-07-17 ENCOUNTER — Ambulatory Visit (INDEPENDENT_AMBULATORY_CARE_PROVIDER_SITE_OTHER): Payer: 59 | Admitting: Student

## 2020-07-17 VITALS — BP 136/82 | HR 78 | Ht 63.0 in | Wt 266.0 lb

## 2020-07-17 DIAGNOSIS — R030 Elevated blood-pressure reading, without diagnosis of hypertension: Secondary | ICD-10-CM

## 2020-07-17 DIAGNOSIS — R55 Syncope and collapse: Secondary | ICD-10-CM | POA: Diagnosis not present

## 2020-07-17 DIAGNOSIS — R600 Localized edema: Secondary | ICD-10-CM

## 2020-07-17 DIAGNOSIS — R296 Repeated falls: Secondary | ICD-10-CM

## 2020-07-17 DIAGNOSIS — R0609 Other forms of dyspnea: Secondary | ICD-10-CM

## 2020-07-17 DIAGNOSIS — E785 Hyperlipidemia, unspecified: Secondary | ICD-10-CM

## 2020-07-17 DIAGNOSIS — Z87898 Personal history of other specified conditions: Secondary | ICD-10-CM

## 2020-07-17 LAB — CBC
Hematocrit: 42.2 % (ref 34.0–46.6)
Hemoglobin: 13.8 g/dL (ref 11.1–15.9)
MCH: 28 pg (ref 26.6–33.0)
MCHC: 32.7 g/dL (ref 31.5–35.7)
MCV: 86 fL (ref 79–97)
Platelets: 240 10*3/uL (ref 150–450)
RBC: 4.93 x10E6/uL (ref 3.77–5.28)
RDW: 13.3 % (ref 11.7–15.4)
WBC: 5.4 10*3/uL (ref 3.4–10.8)

## 2020-07-17 LAB — BASIC METABOLIC PANEL
BUN/Creatinine Ratio: 20 (ref 12–28)
BUN: 17 mg/dL (ref 8–27)
CO2: 24 mmol/L (ref 20–29)
Calcium: 9.6 mg/dL (ref 8.7–10.3)
Chloride: 102 mmol/L (ref 96–106)
Creatinine, Ser: 0.84 mg/dL (ref 0.57–1.00)
GFR calc Af Amer: 86 mL/min/{1.73_m2} (ref >59)
GFR calc non Af Amer: 75 mL/min/{1.73_m2} (ref >59)
Glucose: 101 mg/dL — ABNORMAL HIGH (ref 65–99)
Potassium: 4.7 mmol/L (ref 3.5–5.2)
Sodium: 139 mmol/L (ref 134–144)

## 2020-07-17 NOTE — Progress Notes (Signed)
Patient ID: [name], female   DOB: 17-Jul-1956, 64 y.o.   MRN: [id number] Patient enrolled for [employer] to ship a 30 day cardiac event monitor to her home.

## 2020-07-17 NOTE — Patient Instructions (Signed)
Medication Instructions:  No changes *If you need a refill on your cardiac medications before your next appointment, please call your pharmacy*   Lab Work: BMP, CBC If you have labs (blood work) drawn today and your tests are completely normal, you will receive your results only by: Marland Kitchen MyChart Message (if you have MyChart) OR . A paper copy in the mail If you have any lab test that is abnormal or we need to change your treatment, we will call you to review the results.   Testing/Procedures: [address], [address] Your physician has requested that you have an echocardiogram. Echocardiography is a painless test that uses sound waves to create images of your heart. It provides your doctor with information about the size and shape of your heart and how well your heart's chambers and valves are working. This procedure takes approximately one hour. There are no restrictions for this procedure.  [employer] Cardiac Event Monitor Instructions Your physician has requested you wear your cardiac event monitor for _____ days, (1-30). [employer] may call or text to confirm a shipping address. The monitor will be sent to a land address via UPS. [employer] will not ship a monitor to a PO BOX. It typically takes 3-5 days to receive your monitor after it has been enrolled. [employer] will assist with USPS tracking if your package is delayed. The telephone number for [employer] is [phone]. Once you have received your monitor, please review the enclosed instructions. Instruction tutorials can also be viewed under help and settings on the enclosed cell phone. Your monitor has already been registered assigning a specific monitor serial # to you.  Applying the monitor Remove cell phone from case and turn it on. The cell phone works as Dealer and needs to be within [employer] of you at all times. The cell phone will need to be charged on a daily basis. We recommend you plug the cell phone  into the enclosed charger at your bedside table every night.  Monitor batteries: You will receive two monitor batteries labelled #1 and #2. These are your recorders. Plug battery #2 onto the second connection on the enclosed charger. Keep one battery on the charger at all times. This will keep the monitor battery deactivated. It will also keep it fully charged for when you need to switch your monitor batteries. A small light will be blinking on the battery emblem when it is charging. The light on the battery emblem will remain on when the battery is fully charged.  Open package of a Monitor strip. Insert battery #1 into black hood on strip and gently squeeze monitor battery onto connection as indicated in instruction booklet. Set aside while preparing skin.  Choose location for your strip, vertical or horizontal, as indicated in the instruction booklet. Shave to remove all hair from location. There cannot be any lotions, oils, powders, or colognes on skin where monitor is to be applied. Wipe skin clean with enclosed Saline wipe. Dry skin completely.  Peel paper labeled #1 off the back of the Monitor strip exposing the adhesive. Place the monitor on the chest in the vertical or horizontal position shown in the instruction booklet. One arrow on the monitor strip must be pointing upward. Carefully remove paper labeled #2, attaching remainder of strip to your skin. Try not to create any folds or wrinkles in the strip as you apply it.  Firmly press and release the circle in the center of the monitor battery. You will hear a  small beep. This is turning the monitor battery on. The heart emblem on the monitor battery will light up every 5 seconds if the monitor battery in turned on and connected to the patient securely. Do not push and hold the circle down as this turns the monitor battery off. The cell phone will locate the monitor battery. A screen will appear on the cell phone checking the  connection of your monitor strip. This may read poor connection initially but change to good connection within the next minute. Once your monitor accepts the connection you will hear a series of 3 beeps followed by a climbing crescendo of beeps. A screen will appear on the cell phone showing the two monitor strip placement options. Touch the picture that demonstrates where you applied the monitor strip.  Your monitor strip and battery are waterproof. You are able to shower, bathe, or swim with the monitor on. They just ask you do not submerge deeper than 3 feet underwater. We recommend removing the monitor if you are swimming in a lake, river, or ocean.  Your monitor battery will need to be switched to a fully charged monitor battery approximately once a week. The cell phone will alert you of an action which needs to be made.  On the cell phone, tap for details to reveal connection status, monitor battery status, and cell phone battery status. The green dots indicates your monitor is in good status. A red dot indicates there is something that needs your attention.  To record a symptom, click the circle on the monitor battery. In 30-60 seconds a list of symptoms will appear on the cell phone. Select your symptom and tap save. Your monitor will record a sustained or significant arrhythmia regardless of you clicking the button. Some patients do not feel the heart rhythm irregularities. [employer] will notify us of any serious or critical events.  Refer to instruction booklet for instructions on switching batteries, changing strips, the Do not disturb or Pause features, or any additional questions.  Call [employer] at [phone], to confirm your monitor is transmitting and record your baseline. They will answer any questions you may have regarding the monitor instructions at that time.  Returning the monitor to [place] all equipment back into blue box. Peel off strip of paper to  expose adhesive and close box securely. There is a prepaid UPS shipping label on this box. Drop in a UPS drop box, or at a UPS facility like [employer]. You may also contact [employer] to arrange UPS to pick up monitor package at your home.   Follow-Up: At [facility], you and your health needs are our priority.  As part of our continuing mission to provide you with exceptional heart care, we have created designated Provider Care Teams.  These Care Teams include your primary Cardiologist (physician) and Advanced Practice Providers (APPs -  Physician Assistants and Nurse Practitioners) who all work together to provide you with the care you need, when you need it.    Your next appointment:   2 month(s)  The format for your next appointment:   In Person  Provider:   You may see Kirk Ruths, MDor one of the following Advanced Practice Providers on your designated Care Team:    Sande Rives, PA-C    Other Instructions Log Blood Pressure and Heart Rate for4 2 weeks

## 2020-07-21 ENCOUNTER — Ambulatory Visit (INDEPENDENT_AMBULATORY_CARE_PROVIDER_SITE_OTHER): Payer: 59

## 2020-07-21 DIAGNOSIS — R55 Syncope and collapse: Secondary | ICD-10-CM

## 2020-07-27 DIAGNOSIS — Z79899 Other long term (current) drug therapy: Secondary | ICD-10-CM

## 2020-07-27 MED ORDER — HYDROCHLOROTHIAZIDE 25 MG PO TABS: 25.0000 mg | ORAL_TABLET | Freq: Every day | ORAL | 3 refills | Status: DC

## 2020-07-27 NOTE — Telephone Encounter (Signed)
Let's go ahead and have patient start HCTZ 25mg  daily and then come in for a repeat BMET in 1 week so we can make sure her kidney function and electrolytes are OK. Please also have patient continue to keep log of BP after starting HCTZ and notify us if BP is still consistently above 130/80.  Thank you so much! Ishana Blades

## 2020-07-27 NOTE — Telephone Encounter (Signed)
Spoke with patient. Informed patient of new orders for medication and lab work. Patient aware to keep BP log and notify us if blood pressure continues to run >130/80 consistently.

## 2020-08-03 LAB — BASIC METABOLIC PANEL
BUN/Creatinine Ratio: 25 (ref 12–28)
BUN: 22 mg/dL (ref 8–27)
CO2: 24 mmol/L (ref 20–29)
Calcium: 10.3 mg/dL (ref 8.7–10.3)
Chloride: 99 mmol/L (ref 96–106)
Creatinine, Ser: 0.88 mg/dL (ref 0.57–1.00)
GFR calc Af Amer: 81 mL/min/{1.73_m2} (ref >59)
GFR calc non Af Amer: 71 mL/min/{1.73_m2} (ref >59)
Glucose: 116 mg/dL — ABNORMAL HIGH (ref 65–99)
Potassium: 4.7 mmol/L (ref 3.5–5.2)
Sodium: 139 mmol/L (ref 134–144)

## 2020-08-07 DIAGNOSIS — Z79899 Other long term (current) drug therapy: Secondary | ICD-10-CM

## 2020-08-07 NOTE — Telephone Encounter (Signed)
[name],  Let's have her stop the HCTZ since she seems to be having a lot of side effects with this and start Losartan 25mg  daily instead. This should not cause increased urinary frequency like the diuretic. She will need repeat BMET in 1-2 weeks. Please also have her continue to keep a log of her BP/HR and send another MyChart message with this information in 2 weeks.  Thank you so much! Marjo Grosvenor

## 2020-08-08 MED ORDER — LOSARTAN POTASSIUM 25 MG PO TABS: 25.0000 mg | ORAL_TABLET | Freq: Every day | ORAL | 3 refills | Status: DC

## 2020-08-08 NOTE — Telephone Encounter (Signed)
Called patient directly. Informed her of Callie's recommendations to discontinue HCTZ and begin Losartan 25 mg once daily. Advised we would like to check BMET in 1-2 weeks. The patient verbalizes understanding and agreement with plan.

## 2020-08-08 NOTE — Addendum Note (Signed)
Addended by: Lebron Conners, Maralyn Sago E on: 08/08/2020 08:36 AM   Modules accepted: Orders

## 2020-08-11 ENCOUNTER — Ambulatory Visit: Payer: 59 | Attending: Internal Medicine

## 2020-08-11 DIAGNOSIS — Z23 Encounter for immunization: Secondary | ICD-10-CM

## 2020-08-11 NOTE — Progress Notes (Signed)
   Covid-19 Vaccination Clinic  Name:  [name]    MRN: [id number] DOB: [birth year]  08/11/2020  [name] was observed post Covid-19 immunization for 15 minutes without incident. She was provided with Vaccine Information Sheet and instruction to access the V-Safe system.   [name] was instructed to call 911 with any severe reactions post vaccine: Marland Kitchen Difficulty breathing  . Swelling of face and throat  . A fast heartbeat  . A bad rash all over body  . Dizziness and weakness   Immunizations Administered    Name Date Dose VIS Date Route   Pfizer COVID-19 Vaccine 08/11/2020  2:13 PM 0.3 mL 05/24/2020 Intramuscular   Manufacturer: [place]   Lot: [id number]   NDC: [address]-5

## 2020-08-14 ENCOUNTER — Other Ambulatory Visit: Payer: Self-pay

## 2020-08-14 ENCOUNTER — Ambulatory Visit (HOSPITAL_COMMUNITY): Payer: 59 | Attending: Internal Medicine

## 2020-08-14 DIAGNOSIS — R55 Syncope and collapse: Secondary | ICD-10-CM | POA: Diagnosis not present

## 2020-08-14 LAB — ECHOCARDIOGRAM COMPLETE
Area-P 1/2: 3.99 cm2
S' Lateral: 2.9 cm

## 2020-08-18 ENCOUNTER — Telehealth: Payer: Self-pay

## 2020-08-18 ENCOUNTER — Other Ambulatory Visit: Payer: Self-pay

## 2020-08-18 DIAGNOSIS — R160 Hepatomegaly, not elsewhere classified: Secondary | ICD-10-CM

## 2020-08-18 NOTE — Telephone Encounter (Signed)
Check lipids, liver and bmet. Kirk Ruths

## 2020-08-18 NOTE — Telephone Encounter (Signed)
When I was speaking with pt about her recent ECHO results, she states that when wearing her [employer] monitor she got a rash from the leads and she called [employer](07-30-20) and they sent new leads but they were "the wrong kind" and she was unable to use them . She states that she has called them at least 3 times and they have not sent her any new ones. So she is going to send the monitor back today. Also, she would like to have lipid panel and any other labs drawn before her upcoming appointment with Dr [name].    Please advise

## 2020-08-21 ENCOUNTER — Other Ambulatory Visit: Payer: Self-pay | Admitting: *Deleted

## 2020-08-21 ENCOUNTER — Encounter: Payer: Self-pay | Admitting: Internal Medicine

## 2020-08-21 MED ORDER — ALBUTEROL SULFATE HFA 108 (90 BASE) MCG/ACT IN AERS: 2.0000 | INHALATION_SPRAY | RESPIRATORY_TRACT | 1 refills | Status: DC | PRN

## 2020-08-23 NOTE — Telephone Encounter (Signed)
Spoke with patient regarding the appointment scheduled 08/31/20  Liver ultra sound appt at 10:00 am at [facility]---arrrival time is 9:45 am---NPO after midnight.  Patient voiced her understanding

## 2020-08-29 ENCOUNTER — Ambulatory Visit (HOSPITAL_COMMUNITY): Payer: 59

## 2020-08-31 ENCOUNTER — Telehealth: Payer: Self-pay | Admitting: Student

## 2020-08-31 ENCOUNTER — Ambulatory Visit (HOSPITAL_COMMUNITY)
Admission: RE | Admit: 2020-08-31 | Discharge: 2020-08-31 | Disposition: A | Discharge status: Home / Self Care | Payer: 59 | Source: Ambulatory Visit | Attending: Student | Admitting: Student

## 2020-08-31 ENCOUNTER — Other Ambulatory Visit: Payer: Self-pay

## 2020-08-31 ENCOUNTER — Other Ambulatory Visit: Payer: Self-pay | Admitting: Student

## 2020-08-31 DIAGNOSIS — R160 Hepatomegaly, not elsewhere classified: Secondary | ICD-10-CM

## 2020-08-31 NOTE — Telephone Encounter (Signed)
D/w Ritu she will change to correct order. RUQ [place]

## 2020-08-31 NOTE — Telephone Encounter (Signed)
New message:     Ruti calling from [facility] needing a order for this patient will be there 10 am

## 2020-10-24 NOTE — Progress Notes (Signed)
HPI: FU hyperlipidemia andchest pain. Nuclear study 9/17 showed ejection fraction 63%. There was a small defect suggestive of ischemia in the apical lateral wall.Cardiac catheterization 11/17 showed normal LV function and normal coronary anatomy.Pt intolerant to statinsand referred to lipid clinic; started on repatha.  Patient is seen with recurrent falls and question syncope December 2021.  Echocardiogram 1/22 showed normal LV function, grade 1 diastolic dysfunction and mildly dilated ascending aorta at 40 mm. Monitor 1/22 showed sinus bradycardia, normal sinus rhythm and sinus tachycardia.  Since last seen,she has dyspnea on exertion.  No orthopnea or PND.  Occasional mild pedal edema.  She denies exertional chest pain but has a "electrical sensation" in her chest at times.  She states she has some dizziness with standing but has had no further falls.  Current Outpatient Medications  Medication Sig Dispense Refill  . albuterol (VENTOLIN HFA) 108 (90 Base) MCG/ACT inhaler Inhale 2 puffs into the lungs every 4 (four) hours as needed for wheezing or shortness of breath. 1 each 1  . benzonatate (TESSALON PERLES) 100 MG capsule Take 1 capsule (100 mg total) by mouth 3 (three) times daily as needed. 20 capsule 0  . cetirizine (ZYRTEC) 10 MG tablet TAKE 1 TABLET BY MOUTH AT BEDTIME 90 tablet 3  . ciprofloxacin (CIPRO) 750 MG tablet Take 750 mg by mouth 2 (two) times daily.    . Evolocumab (REPATHA SURECLICK) 295 MG/ML SOAJ Inject 140 mg into the skin every 14 (fourteen) days. 2 pen 11  . fluticasone (FLONASE) 50 MCG/ACT nasal spray SHAKE Liquid and Use 2 sprays in each nostril twice a day  5  . loperamide (IMODIUM) 2 MG capsule Take 1 capsule (2 mg total) by mouth 4 (four) times daily as needed for diarrhea or loose stools. 12 capsule 0  . losartan (COZAAR) 25 MG tablet Take 1 tablet (25 mg total) by mouth daily. 90 tablet 3  . metroNIDAZOLE (FLAGYL) 250 MG tablet   0  . montelukast  (SINGULAIR) 10 MG tablet Take 1 tablet (10 mg total) by mouth at bedtime. 90 tablet 3  . Nutritional Supplements (JUICE PLUS FIBRE PO) Take 4 each by mouth daily.     No current facility-administered medications for this visit.     Past Medical History:  Diagnosis Date  . Allergy   . Arthritis    neck, knees, ankles, feet, back  . Asthma    prn inhaler  . Dental crowns present   . Epigastric pain 06/11/2017  . Essential hypertension 06/16/2008   Qualifier: Diagnosis of  By: Ronnald Ramp MD, Arvid Right.   . Hallux valgus of right foot 05/2014  . History of TIA (transient ischemic attack) 2004  . Hyperglycemia 11/26/2007   Qualifier: Diagnosis of  By: Larose Kells MD, Bear Hyperlipidemia   . Hypertension   . Limited joint range of motion    neck - states has bone spurs C2-5  . Mild intermittent asthma 11/26/2007      . Morbid obesity ([place]) 03/19/2016  . TMJ syndrome     Past Surgical History:  Procedure Laterality Date  . ACROMIONECTOMY Left 08/18/2008  . APPENDECTOMY  1979  . BUNIONECTOMY Left 07/17/2007  . BUNIONECTOMY Right 05/26/2014   Procedure: RIGHT FOOT LAPIDUS BUNION CORRECTION AMD MODIFIED MCBRIDE BUNIONECTOMY;  Surgeon: Wylene Simmer, MD;  Location: [facility];  Service: Orthopedics;  Laterality: Right;  . CARDIAC CATHETERIZATION N/A 06/28/2016   Procedure: Left Heart Cath and Coronary  Angiography;  Surgeon: Peter M Martinique, MD;  Location: [facility];  Service: Cardiovascular;  Laterality: N/A;  . CESAREAN SECTION  1986  . correction of bunionectomy  2016   right foot   . GANGLION CYST EXCISION     wrist  . NASAL SEPTUM SURGERY  1981  . ROTATOR CUFF REPAIR W/ DISTAL CLAVICLE EXCISION Left 08/18/2008  . SHOULDER ARTHROSCOPY Left 06/07/2008  . thumb surgery  07-28-2015   left thumb knuckle collasped and had surgery   . TONSILLECTOMY  1964  . TUBAL LIGATION  1999    Social History   Socioeconomic History  . Marital status: Married    Spouse name: Not  on file  . Number of children: 2  . Years of education: Not on file  . Highest education level: Not on file  Occupational History  . Not on file  Tobacco Use  . Smoking status: Former Smoker    Quit date: 09/12/2005    Years since quitting: 15.1  . Smokeless tobacco: Never Used  Vaping Use  . Vaping Use: Never used  Substance and Sexual Activity  . Alcohol use: Yes    Alcohol/week: 0.0 standard drinks    Comment: occasionally  . Drug use: No  . Sexual activity: Not on file  Other Topics Concern  . Not on file  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[employer]   Financial Resource Strain: Not on file  Food Insecurity: Not on file  Transportation Needs: Not on file  Physical Activity: Not on file  Stress: Not on file  Social Connections: Not on file  Intimate Partner Violence: Not on file    Family History  Problem Relation Age of Onset  . Cancer Mother        lung  . Diabetes Mother   . Thalassemia Mother   . Colon polyps Mother   . Cancer Brother        lymphatic  . Heart disease Paternal Aunt   . Atrial fibrillation Father   . Thalassemia Sister   . Thalassemia Maternal Aunt   . Colon cancer Neg Hx   . Rectal cancer Neg Hx   . Stomach cancer Neg Hx     ROS: no fevers or chills, productive cough, hemoptysis, dysphasia, odynophagia, melena, hematochezia, dysuria, hematuria, rash, seizure activity, orthopnea, PND, pedal edema, claudication. Remaining systems are negative.  Physical Exam: Well-developed obese in no acute distress.  Skin is warm and dry.  HEENT is normal.  Neck is supple.  Chest is clear to auscultation with normal expansion.  Cardiovascular exam is regular rate and rhythm.  Abdominal exam nontender or distended. No masses palpated. Extremities show no edema. neuro grossly intact  ECG-July 17, 2020-normal sinus rhythm with no ST changes.  Personally reviewed  A/P  1 chest pain-patient with history of chest pain but  no recurrences.  Previous catheterization revealed no coronary disease.  2 hyperlipidemia-continue Repatha.  Check lipids and liver.  3 obesity-needs weight loss  4 Dyspnea-this is felt secondary to deconditioning and obesity hypoventilation syndrome.  5 history of recurrent falls/question syncope-previous monitor unremarkable but she only wore this for 4 days due to allergic reaction to leads.  We discussed the potential for an apple watch to record his strip if she has any further symptoms.  Note LV function normal.  6 hypertension-blood pressure is mildly elevated.  However I am hesitant to increase her dose as she has orthostatic symptoms.  We will follow her blood pressure and adjust medications as needed.  Kirk Ruths, MD

## 2020-10-30 ENCOUNTER — Other Ambulatory Visit: Payer: Self-pay

## 2020-10-30 ENCOUNTER — Encounter: Payer: Self-pay | Admitting: Cardiology

## 2020-10-30 ENCOUNTER — Ambulatory Visit: Payer: 59 | Admitting: Cardiology

## 2020-10-30 VITALS — BP 142/78 | HR 70 | Ht 63.0 in | Wt 258.6 lb

## 2020-10-30 DIAGNOSIS — R55 Syncope and collapse: Secondary | ICD-10-CM

## 2020-10-30 DIAGNOSIS — R0609 Other forms of dyspnea: Secondary | ICD-10-CM

## 2020-10-30 DIAGNOSIS — E785 Hyperlipidemia, unspecified: Secondary | ICD-10-CM | POA: Diagnosis not present

## 2020-10-30 DIAGNOSIS — Z87898 Personal history of other specified conditions: Secondary | ICD-10-CM | POA: Diagnosis not present

## 2020-10-30 LAB — LIPID PANEL
Chol/HDL Ratio: 5.2 ratio — ABNORMAL HIGH (ref 0.0–4.4)
Cholesterol, Total: 218 mg/dL — ABNORMAL HIGH (ref 100–199)
HDL: 42 mg/dL (ref >39)
LDL Chol Calc (NIH): 140 mg/dL — ABNORMAL HIGH (ref 0–99)
Triglycerides: 201 mg/dL — ABNORMAL HIGH (ref 0–149)
VLDL Cholesterol Cal: 36 mg/dL (ref 5–40)

## 2020-10-30 LAB — HEPATIC FUNCTION PANEL
ALT: 24 IU/L (ref 0–32)
AST: 21 IU/L (ref 0–40)
Albumin: 4.6 g/dL (ref 3.8–4.8)
Alkaline Phosphatase: 93 IU/L (ref 44–121)
Bilirubin Total: 0.4 mg/dL (ref 0.0–1.2)
Bilirubin, Direct: 0.11 mg/dL (ref 0.00–0.40)
Total Protein: 7 g/dL (ref 6.0–8.5)

## 2020-10-30 NOTE — Patient Instructions (Signed)

## 2020-10-31 DIAGNOSIS — E785 Hyperlipidemia, unspecified: Secondary | ICD-10-CM

## 2020-10-31 MED ORDER — EZETIMIBE 10 MG PO TABS: 10.0000 mg | ORAL_TABLET | Freq: Every day | ORAL | 3 refills | Status: DC

## 2020-11-08 ENCOUNTER — Other Ambulatory Visit: Payer: Self-pay | Admitting: Internal Medicine

## 2020-11-09 ENCOUNTER — Other Ambulatory Visit: Payer: Self-pay | Admitting: Internal Medicine

## 2020-11-10 ENCOUNTER — Other Ambulatory Visit: Payer: Self-pay | Admitting: Internal Medicine

## 2020-12-25 ENCOUNTER — Other Ambulatory Visit (HOSPITAL_COMMUNITY): Payer: Self-pay | Admitting: Gastroenterology

## 2020-12-25 DIAGNOSIS — K760 Fatty (change of) liver, not elsewhere classified: Secondary | ICD-10-CM

## 2020-12-27 HISTORY — PX: COLONOSCOPY: SHX174

## 2020-12-29 ENCOUNTER — Ambulatory Visit (HOSPITAL_COMMUNITY)
Admission: RE | Admit: 2020-12-29 | Discharge: 2020-12-29 | Disposition: A | Discharge status: Home / Self Care | Payer: 59 | Source: Ambulatory Visit | Attending: Gastroenterology | Admitting: Gastroenterology

## 2020-12-29 ENCOUNTER — Other Ambulatory Visit: Payer: Self-pay

## 2020-12-29 DIAGNOSIS — K760 Fatty (change of) liver, not elsewhere classified: Secondary | ICD-10-CM | POA: Diagnosis not present

## 2021-01-05 ENCOUNTER — Other Ambulatory Visit: Payer: Self-pay | Admitting: Internal Medicine

## 2021-01-22 ENCOUNTER — Other Ambulatory Visit: Payer: Self-pay | Admitting: Internal Medicine

## 2021-01-23 ENCOUNTER — Other Ambulatory Visit: Payer: Self-pay | Admitting: Internal Medicine

## 2021-01-23 ENCOUNTER — Ambulatory Visit: Payer: 59 | Admitting: Medical

## 2021-01-23 ENCOUNTER — Other Ambulatory Visit: Payer: Self-pay

## 2021-01-23 VITALS — BP 140/80 | HR 81 | Temp 98.5°F | Resp 20 | Ht 63.0 in | Wt 255.0 lb

## 2021-01-23 DIAGNOSIS — R059 Cough, unspecified: Secondary | ICD-10-CM | POA: Diagnosis not present

## 2021-01-23 DIAGNOSIS — J329 Chronic sinusitis, unspecified: Secondary | ICD-10-CM

## 2021-01-23 DIAGNOSIS — R062 Wheezing: Secondary | ICD-10-CM | POA: Diagnosis not present

## 2021-01-23 DIAGNOSIS — J40 Bronchitis, not specified as acute or chronic: Secondary | ICD-10-CM | POA: Diagnosis not present

## 2021-01-23 MED ORDER — PREDNISONE 10 MG (21) PO TBPK: ORAL_TABLET | ORAL | 0 refills | Status: DC

## 2021-01-23 MED ORDER — CETIRIZINE HCL 10 MG PO TABS: 10.0000 mg | ORAL_TABLET | Freq: Every day | ORAL | 3 refills | Status: DC

## 2021-01-23 MED ORDER — AZITHROMYCIN 250 MG PO TABS: ORAL_TABLET | ORAL | 0 refills | Status: AC

## 2021-01-23 NOTE — Progress Notes (Signed)
Subjective:    Patient ID: [name], female    DOB: Nov 15, 1955, 65 y.o.   MRN: [id number]  HPI  Pt in for nasal congestion, sinus pressure and cough. Pt had this for 3 weeks. She has a lot of pnd. Cough drops and benzonatate not stopping cough.  Pt also is on zyrtec and singulair. Pt does not like to use flonase.  Pt having to use albuterol 5 times. Pt is wheezing at night.   Pt is not diabetic. But is prediabetic.  Over last 3 weeks she had 9 negative covid tests.     Review of Systems  Constitutional:  Negative for chills, fatigue and fever.  HENT:  Positive for sinus pressure and sinus pain. Negative for congestion and ear pain.   Respiratory:  Negative for cough, chest tightness, shortness of breath and wheezing.   Cardiovascular:  Negative for chest pain and palpitations.  Gastrointestinal:  Negative for abdominal pain, blood in stool and diarrhea.  Endocrine: Negative for polydipsia and polyuria.  Musculoskeletal:  Negative for back pain and myalgias.  Neurological:  Negative for dizziness, seizures, syncope, weakness and light-headedness.  Hematological:  Negative for adenopathy. Does not bruise/bleed easily.  Psychiatric/Behavioral:  Negative for behavioral problems and confusion.     Past Medical History:  Diagnosis Date   Allergy    Arthritis    neck, knees, ankles, feet, back   Asthma    prn inhaler   Dental crowns present    Epigastric pain 06/11/2017   Essential hypertension 06/16/2008   Qualifier: Diagnosis of  By: Ronnald Ramp MD, Arvid Right.    Hallux valgus of right foot 05/2014   History of TIA (transient ischemic attack) 2004   Hyperglycemia 11/26/2007   Qualifier: Diagnosis of  By: Larose Kells MD, [place]    Hyperlipidemia    Hypertension    Limited joint range of motion    neck - states has bone spurs C2-5   Mild intermittent asthma 11/26/2007       Morbid obesity ([place]) 03/19/2016   TMJ syndrome      Social History   Socioeconomic History   Marital  status: Married    Spouse name: Not on file   Number of children: 2   Years of education: Not on file   Highest education level: Not on file  Occupational History   Not on file  Tobacco Use   Smoking status: Former    Pack years: 0.00    Types: Cigarettes    Quit date: 09/12/2005    Years since quitting: 15.3   Smokeless tobacco: Never  Vaping Use   Vaping Use: Never used  Substance and Sexual Activity   Alcohol use: Yes    Alcohol/week: 0.0 standard drinks    Comment: occasionally   Drug use: No   Sexual activity: Not on file  Other Topics Concern   Not on file  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   Financial Resource Strain: Not on file  Food Insecurity: Not on file  Transportation Needs: Not on file  Physical Activity: Not on file  Stress: Not on file  Social Connections: Not on file  Intimate Partner Violence: Not on file    Past Surgical History:  Procedure Laterality Date   ACROMIONECTOMY Left 08/18/2008   APPENDECTOMY  1979   BUNIONECTOMY Left 07/17/2007   BUNIONECTOMY Right 05/26/2014   Procedure: RIGHT FOOT LAPIDUS BUNION CORRECTION AMD MODIFIED MCBRIDE BUNIONECTOMY;  Surgeon:  Wylene Simmer, MD;  Location: [facility];  Service: Orthopedics;  Laterality: Right;   CARDIAC CATHETERIZATION N/A 06/28/2016   Procedure: Left Heart Cath and Coronary Angiography;  Surgeon: Peter M Martinique, MD;  Location: [facility];  Service: Cardiovascular;  Laterality: N/A;   CESAREAN SECTION  1986   correction of bunionectomy  2016   right foot    GANGLION CYST EXCISION     wrist   NASAL SEPTUM SURGERY  1981   ROTATOR CUFF REPAIR W/ DISTAL CLAVICLE EXCISION Left 08/18/2008   SHOULDER ARTHROSCOPY Left 06/07/2008   thumb surgery  07-28-2015   left thumb knuckle collasped and had surgery    [place]    Family History  Problem Relation Age of Onset   Cancer Mother        lung   Diabetes  Mother    Thalassemia Mother    Colon polyps Mother    Cancer Brother        lymphatic   Heart disease Paternal Aunt    Atrial fibrillation Father    Thalassemia Sister    Thalassemia Maternal Aunt    Colon cancer Neg Hx    Rectal cancer Neg Hx    Stomach cancer Neg Hx     Allergies  Allergen Reactions   Doxycycline Diarrhea and Nausea Only    Nausea and diarrhea started a few hours after taking medication.  Diarrhea and nausea continue 2 days after discontinuation.   Hydrocodone Bit-Homatrop Mbr Itching, Anxiety and Other (See Comments)    Made pt confused    Penicillins Shortness Of Breath and Swelling    TONGUE SWELLING Has patient had a PCN reaction causing immediate rash, facial/tongue/throat swelling, SOB or lightheadedness with hypotension: Unknown Has patient had a PCN reaction causing severe rash involving mucus membranes or skin necrosis: Unknown Has patient had a PCN reaction that required hospitalization: Was already in the hospital when happened Has patient had a PCN reaction occurring within the last 10 years: Yes able to take Augmentin If all of the above answers are "NO", then may proc   Promethazine Hcl Shortness Of Breath   Clarithromycin Nausea And Vomiting   Statins Other (See Comments)    LEG CRAMPS   Other Other (See Comments)    Current Outpatient Medications on File Prior to Visit  Medication Sig Dispense Refill   albuterol (VENTOLIN HFA) 108 (90 Base) MCG/ACT inhaler Inhale 2 puffs into the lungs every 4 (four) hours as needed for wheezing or shortness of breath. 1 each 1   cetirizine (ZYRTEC) 10 MG tablet TAKE 1 TABLET BY MOUTH AT BEDTIME 60 tablet 0   Evolocumab (REPATHA SURECLICK) 798 MG/ML SOAJ Inject 140 mg into the skin every 14 (fourteen) days. 2 pen 11   ezetimibe (ZETIA) 10 MG tablet Take 1 tablet (10 mg total) by mouth daily. 90 tablet 3   fluticasone (FLONASE) 50 MCG/ACT nasal spray SHAKE Liquid and Use 2 sprays in each nostril twice a day   5   loperamide (IMODIUM) 2 MG capsule Take 1 capsule (2 mg total) by mouth 4 (four) times daily as needed for diarrhea or loose stools. 12 capsule 0   losartan (COZAAR) 25 MG tablet Take 1 tablet (25 mg total) by mouth daily. 90 tablet 3   montelukast (SINGULAIR) 10 MG tablet TAKE 1 TABLET(10 MG) BY MOUTH AT BEDTIME 60 tablet 0   Nutritional Supplements (JUICE PLUS FIBRE PO) Take  4 each by mouth daily.     No current facility-administered medications on file prior to visit.    BP 140/80   Pulse 81   Temp 98.5 F (36.9 C)   Resp 20   Ht 5\' 3"  (1.6 m)   Wt 255 lb (115.7 kg)   SpO2 96%   BMI 45.17 kg/m       Objective:   Physical Exam   General  Mental Status - Alert. General Appearance - Well groomed. Not in acute distress.  Skin Rashes- No Rashes.  HEENT Head- Normal. Ear Auditory Canal - Left- Normal. Right - Normal.Tympanic Membrane- Left- Normal. Right- Normal. Eye Sclera/Conjunctiva- Left- Normal. Right- Normal. Nose & Sinuses Nasal Mucosa- Left-  Boggy and Congested. Right-  Boggy and  Congested.Bilateral maxillary and frontal sinus pressure.    Neck Neck- Supple. No Masses.   Chest and Lung Exam Auscultation: Breath Sounds:-Clear even and unlabored. Even unlabored.(Just used albuterol before she got to office)  Cardiovascular Auscultation:Rythm- Regular, rate and rhythm. Murmurs & Other Heart Sounds:Ausculatation of the heart reveal- No Murmurs.  Lymphatic Head & Neck General Head & Neck Lymphatics: Bilateral: Description- No Localized lymphadenopathy.      Assessment & Plan:    You appear to have bronchitis and sinusitis. Rest hydrate and tylenol for fever. Continue cough medicine benzonatate and azithromycin antibiotic. For your nasal congestion can use flonase if tolerated.  Continue albuterol inhaler. Since using so frequently decided to go ahead and prescribe 6 day taper dose prednisone.  Refilled your zyrtec.  You should gradually get  better. If not then notify us and would recommend a chest xray.  Follow up in 7-10 days or as needed.  Mackie Pai, PA-C

## 2021-01-23 NOTE — Patient Instructions (Addendum)
You appear to have bronchitis and sinusitis. Rest hydrate and tylenol for fever. Continue cough medicine benzonatate and azithromycin antibiotic. For your nasal congestion can use flonase if tolerated.  Continue albuterol inhaler. Since using so frequently decided to go ahead and prescribe 6 day taper dose prednisone.  Refilled your zyrtec.  You should gradually get better. If not then notify us and would recommend a chest xray.  Follow up in 7-10 days or as needed

## 2021-01-29 ENCOUNTER — Telehealth: Payer: Self-pay | Admitting: Internal Medicine

## 2021-01-29 NOTE — Telephone Encounter (Addendum)
   Patient feels "shaky", would like to know if her blood sugar could be tested in office today

## 2021-01-29 NOTE — Telephone Encounter (Signed)
Unable to get in contact with the patient. LVM asking the patient to call our office back to discuss. Office number was provided.

## 2021-01-31 ENCOUNTER — Other Ambulatory Visit: Payer: Self-pay | Admitting: Internal Medicine

## 2021-01-31 ENCOUNTER — Encounter: Payer: Self-pay | Admitting: Gastroenterology

## 2021-02-01 ENCOUNTER — Telehealth: Payer: Self-pay | Admitting: Internal Medicine

## 2021-02-01 ENCOUNTER — Other Ambulatory Visit: Payer: 59

## 2021-02-01 ENCOUNTER — Encounter: Payer: Self-pay | Admitting: Internal Medicine

## 2021-02-01 NOTE — Telephone Encounter (Signed)
Patient's cardiologist is requesting for her to do before refilling Montelukast Sodium 10 MG after taking the ezetimibe. Stated she only has one more of the Montelukast left.   Please advise and callback at [phone]

## 2021-02-01 NOTE — Telephone Encounter (Signed)
I am unsure of the question here, can you clarify?

## 2021-02-02 NOTE — Telephone Encounter (Signed)
Patient came into the office stating she needed a refill on the medication but her cardiologists suggested that she get blood work to determine if she still needs to be keep taking that medication.   Could labs be ordered for the patient?  Please advise.

## 2021-02-02 NOTE — Telephone Encounter (Signed)
That is an allergy medication so no blood work is needed for effectiveness. If she is due and wants to come for physical to discuss overall health she can schedule.

## 2021-02-02 NOTE — Telephone Encounter (Signed)
Called patient. LDVM with Dr. Nathanial Millman suggestion. Office number was provided.

## 2021-02-19 ENCOUNTER — Encounter: Payer: Self-pay | Admitting: *Deleted

## 2021-02-19 DIAGNOSIS — E7800 Pure hypercholesterolemia, unspecified: Secondary | ICD-10-CM

## 2021-02-19 DIAGNOSIS — R739 Hyperglycemia, unspecified: Secondary | ICD-10-CM

## 2021-02-22 NOTE — Telephone Encounter (Signed)
Cardiologist office calling requesting bloodwork to be drawn on pt; cholesterol needs to be checked b/c medication was changed.  Notified that pt needs appt as she has not have OV with PCP since 03/03/20.  [name] aware that pt will be called for appt request & pt will be able to send labs once resulted.

## 2021-02-22 NOTE — Telephone Encounter (Signed)
Called to notify pt that she is due for annual appt since last appt on 03/03/20.  Pt declines appt at this time since she gets her annuals in Sept (informed pt of last OV).  Pt states the previous message of needing singular for Zetia was incorrect.  She needed bloodwork drawn b/c pt started Zetia x 35mo ago & cardiologist needed to know effectiveness; cardiologist is out of network, so pt is requesting order for cholesterol labs.  Pt aware PCP is out of office & will make determination next week.  Annual appt made for 03/05/21.

## 2021-02-26 NOTE — Telephone Encounter (Signed)
Patient is requesting her labwork to be order before her appointment on 03/21/2021.   Please advise

## 2021-02-27 NOTE — Telephone Encounter (Signed)
Labs entered via separate but related mychart message encounter and patient notified of same via mychart.

## 2021-03-05 ENCOUNTER — Encounter: Payer: 59 | Admitting: Internal Medicine

## 2021-03-15 ENCOUNTER — Encounter: Payer: 59 | Admitting: Internal Medicine

## 2021-03-21 ENCOUNTER — Encounter: Payer: 59 | Admitting: Internal Medicine

## 2021-03-26 ENCOUNTER — Other Ambulatory Visit: Payer: Self-pay

## 2021-03-26 ENCOUNTER — Encounter: Payer: Self-pay | Admitting: Internal Medicine

## 2021-03-26 ENCOUNTER — Ambulatory Visit (INDEPENDENT_AMBULATORY_CARE_PROVIDER_SITE_OTHER): Payer: 59 | Admitting: Internal Medicine

## 2021-03-26 VITALS — BP 128/88 | HR 74 | Temp 98.4°F | Resp 18 | Ht 63.0 in | Wt 255.8 lb

## 2021-03-26 DIAGNOSIS — Z23 Encounter for immunization: Secondary | ICD-10-CM | POA: Diagnosis not present

## 2021-03-26 DIAGNOSIS — E7800 Pure hypercholesterolemia, unspecified: Secondary | ICD-10-CM | POA: Diagnosis not present

## 2021-03-26 DIAGNOSIS — M792 Neuralgia and neuritis, unspecified: Secondary | ICD-10-CM

## 2021-03-26 DIAGNOSIS — Z0000 Encounter for general adult medical examination without abnormal findings: Secondary | ICD-10-CM

## 2021-03-26 DIAGNOSIS — I10 Essential (primary) hypertension: Secondary | ICD-10-CM

## 2021-03-26 DIAGNOSIS — R739 Hyperglycemia, unspecified: Secondary | ICD-10-CM

## 2021-03-26 DIAGNOSIS — J4520 Mild intermittent asthma, uncomplicated: Secondary | ICD-10-CM

## 2021-03-26 LAB — COMPREHENSIVE METABOLIC PANEL
ALT: 19 U/L (ref 0–35)
AST: 16 U/L (ref 0–37)
Albumin: 4.5 g/dL (ref 3.5–5.2)
Alkaline Phosphatase: 86 U/L (ref 39–117)
BUN: 23 mg/dL (ref 6–23)
CO2: 28 mEq/L (ref 19–32)
Calcium: 10 mg/dL (ref 8.4–10.5)
Chloride: 103 mEq/L (ref 96–112)
Creatinine, Ser: 0.93 mg/dL (ref 0.40–1.20)
GFR: 65.71 mL/min (ref >60.00)
Glucose, Bld: 104 mg/dL — ABNORMAL HIGH (ref 70–99)
Potassium: 4.8 mEq/L (ref 3.5–5.1)
Sodium: 138 mEq/L (ref 135–145)
Total Bilirubin: 0.6 mg/dL (ref 0.2–1.2)
Total Protein: 7.6 g/dL (ref 6.0–8.3)

## 2021-03-26 LAB — CBC
HCT: 42.7 % (ref 36.0–46.0)
Hemoglobin: 14.4 g/dL (ref 12.0–15.0)
MCHC: 33.6 g/dL (ref 30.0–36.0)
MCV: 87.7 fl (ref 78.0–100.0)
Platelets: 216 10*3/uL (ref 150.0–400.0)
RBC: 4.88 Mil/uL (ref 3.87–5.11)
RDW: 14.2 % (ref 11.5–15.5)
WBC: 7.2 10*3/uL (ref 4.0–10.5)

## 2021-03-26 LAB — VITAMIN B12: Vitamin B-12: 278 pg/mL (ref 211–911)

## 2021-03-26 LAB — VITAMIN D 25 HYDROXY (VIT D DEFICIENCY, FRACTURES): VITD: 18 ng/mL — ABNORMAL LOW (ref 30.00–100.00)

## 2021-03-26 LAB — LIPID PANEL
Cholesterol: 127 mg/dL (ref 0–200)
HDL: 46.7 mg/dL (ref >39.00)
LDL Cholesterol: 53 mg/dL (ref 0–99)
NonHDL: 80.54
Total CHOL/HDL Ratio: 3
Triglycerides: 139 mg/dL (ref 0.0–149.0)
VLDL: 27.8 mg/dL (ref 0.0–40.0)

## 2021-03-26 LAB — HEMOGLOBIN A1C: Hgb A1c MFr Bld: 6.1 % (ref 4.6–6.5)

## 2021-03-26 LAB — TSH: TSH: 2.52 u[IU]/mL (ref 0.35–5.50)

## 2021-03-26 NOTE — Progress Notes (Signed)
   Subjective:   Patient ID: [name], female    DOB: Jan 13, 1956, 65 y.o.   MRN: [id number]  HPI The patient is a 65 YO female coming in for physical. Daughter dealing with stage 4 breast cancer currently.   PMH, [facility], social history reviewed and updated  Review of Systems  Constitutional: Negative.   HENT: Negative.    Eyes: Negative.   Respiratory:  Negative for cough, chest tightness and shortness of breath.   Cardiovascular:  Negative for chest pain, palpitations and leg swelling.  Gastrointestinal:  Positive for diarrhea. Negative for abdominal distention, abdominal pain, constipation, nausea and vomiting.       Chronic stable  Musculoskeletal: Negative.   Skin: Negative.   Neurological: Negative.   Psychiatric/Behavioral: Negative.     Objective:  Physical Exam Constitutional:      Appearance: She is well-developed. She is obese.  HENT:     Head: Normocephalic and atraumatic.  Cardiovascular:     Rate and Rhythm: Normal rate and regular rhythm.  Pulmonary:     Effort: Pulmonary effort is normal. No respiratory distress.     Breath sounds: Normal breath sounds. No wheezing or rales.  Abdominal:     General: Bowel sounds are normal. There is no distension.     Palpations: Abdomen is soft.     Tenderness: There is no abdominal tenderness. There is no rebound.  Musculoskeletal:        General: Tenderness present.     Cervical back: Normal range of motion.  Skin:    General: Skin is warm and dry.  Neurological:     Mental Status: She is alert and oriented to person, place, and time.     Coordination: Coordination normal.    Vitals:   03/26/21 0922  BP: 128/88  Pulse: 74  Resp: 18  Temp: 98.4 F (36.9 C)  TempSrc: Oral  SpO2: 97%  Weight: 255 lb 12.8 oz (116 kg)  Height: '5\' 3"'$  (1.6 m)    This visit occurred during the SARS-CoV-2 public health emergency.  Safety protocols were in place, including screening questions prior to the visit, additional usage  of staff PPE, and extensive cleaning of exam room while observing appropriate contact time as indicated for disinfecting solutions.   Assessment & Plan:  Shingrix IM given at visit

## 2021-03-26 NOTE — Addendum Note (Signed)
Addended by: Jacobo Forest on: 03/26/2021 10:02 AM   Modules accepted: Orders

## 2021-03-26 NOTE — Patient Instructions (Addendum)
We have given you the shingles vaccine.  Try taking calcium or magnesium in the evening to help.

## 2021-03-27 NOTE — Assessment & Plan Note (Signed)
Checking lipid panel and adjust as needed. Taking zetia currently and repatha.

## 2021-03-27 NOTE — Assessment & Plan Note (Signed)
Checking HgA1c for follow up.

## 2021-03-27 NOTE — Assessment & Plan Note (Signed)
BP at goal on losartan 25 mg daily. Checking CMP and adjust as needed.

## 2021-03-27 NOTE — Assessment & Plan Note (Signed)
No flare recently. Uses albuterol prn.

## 2021-03-27 NOTE — Assessment & Plan Note (Signed)
Checking vitamin D, TSH, vitamin B12, HgA1c to rule out metabolic cause for worsening.

## 2021-03-27 NOTE — Assessment & Plan Note (Signed)
Flu shot yearly. Covid-19 up to date counseled. Shingrix given 1st today. Tetanus counseled. Colonoscopy up to date. Mammogram up to date, pap smear up to date. Counseled about sun safety and mole surveillance. Counseled about the dangers of distracted driving. Given 10 year screening recommendations.

## 2021-03-27 NOTE — Assessment & Plan Note (Signed)
Weight is increased recently due to emotional stress and counseled about diet. She is not able to exercise currently.

## 2021-03-30 ENCOUNTER — Other Ambulatory Visit: Payer: Self-pay | Admitting: Internal Medicine

## 2021-03-30 MED ORDER — VITAMIN D (ERGOCALCIFEROL) 1.25 MG (50000 UNIT) PO CAPS: 50000.0000 [IU] | ORAL_CAPSULE | ORAL | 0 refills | Status: DC

## 2021-04-04 ENCOUNTER — Ambulatory Visit: Payer: Self-pay

## 2021-04-04 ENCOUNTER — Other Ambulatory Visit: Payer: Self-pay

## 2021-04-04 ENCOUNTER — Ambulatory Visit: Payer: 59 | Admitting: Sports Medicine

## 2021-04-04 ENCOUNTER — Ambulatory Visit (INDEPENDENT_AMBULATORY_CARE_PROVIDER_SITE_OTHER): Payer: 59

## 2021-04-04 VITALS — BP 130/84 | HR 76 | Ht 63.0 in | Wt 255.4 lb

## 2021-04-04 DIAGNOSIS — M25561 Pain in right knee: Secondary | ICD-10-CM

## 2021-04-04 DIAGNOSIS — M7041 Prepatellar bursitis, right knee: Secondary | ICD-10-CM

## 2021-04-04 NOTE — Progress Notes (Signed)
[name] D.[place] Phone: [phone]   Assessment and Plan:   I, Peterson Lombard, PhD, LAT, ATC, am acting as a Education administrator for [employer].   1. Acute pain of right knee 2.  Prepatellar bursitis, right -Acute, initial visit - Prepatellar edema and edema and patellar tendon from traumatic fall to right knee without disruption seen on [place] - X-ray ordered for patient to complete while leaving clinic.  Will review and contact patient if any concerns - RICE therapy and start ROM to decrease risk of stiffness as tolerable within 48 hours - Voltaren gel over painful area - [place] LIMITED JOINT SPACE STRUCTURES UP RIGHT(NO LINKED CHARGES); Future - DG Knee AP/LAT W/Sunrise Right; Future    All pertinent previous records reviewed prior to and during visit.    Follow Up: In 2 weeks for reevaluation.   Subjective:    Chief Complaint: R Knee pain  HPI: Pt is a 65 y/o female c/o knee pain that she injured this morning when falling. 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 Pt locates pain to all over  R knee, esp anteriorly. Pt reports pain is "sharp" and "severe."  Knee swelling: yes Mechanical symptoms: yes Treatment tried: none  Relevant Historical Information: No history of significant injury to right knee  Additional pertinent review of systems negative.   Current Outpatient Medications:    albuterol (VENTOLIN HFA) 108 (90 Base) MCG/ACT inhaler, Inhale 2 puffs into the lungs every 4 (four) hours as needed for wheezing or shortness of breath., Disp: 1 each, Rfl: 1   cetirizine (ZYRTEC) 10 MG tablet, Take 1 tablet (10 mg total) by mouth at bedtime., Disp: 90 tablet, Rfl: 3   Evolocumab (REPATHA SURECLICK) XX123456 MG/ML SOAJ, Inject 140 mg into the skin every 14 (fourteen) days., Disp: 2 pen, Rfl: 11   ezetimibe (ZETIA) 10 MG tablet, Take 1 tablet (10 mg total) by mouth  daily., Disp: 90 tablet, Rfl: 3   fluticasone (FLONASE) 50 MCG/ACT nasal spray, SHAKE Liquid and Use 2 sprays in each nostril twice a day, Disp: , Rfl: 5   losartan (COZAAR) 25 MG tablet, Take 1 tablet (25 mg total) by mouth daily., Disp: 90 tablet, Rfl: 3   montelukast (SINGULAIR) 10 MG tablet, TAKE 1 TABLET(10 MG) BY MOUTH AT BEDTIME, Disp: 90 tablet, Rfl: 0   Nutritional Supplements (JUICE PLUS FIBRE PO), Take 4 each by mouth daily., Disp: , Rfl:    Vitamin D, Ergocalciferol, (DRISDOL) 1.25 MG (50000 UNIT) CAPS capsule, Take 1 capsule (50,000 Units total) by mouth every 7 (seven) days., Disp: 12 capsule, Rfl: 0   Objective:     There were no vitals filed for this visit.    There is no height or weight on file to calculate BMI.    Physical Exam:    General:  awake, alert oriented, no acute distress nontoxic Skin: no suspicious lesions or rashes Neuro:sensation intact, no deficits, strength 5/5 with no deficits, no atrophy, normal muscle tone Psych: No signs of anxiety, depression or other mood disorder  Knee: Mild swelling over prepatellar lateral knee without deformity Neg fluid wave, joint milking ROM Flex 70, Ext 15 TTP over edema, lateral femoral condyle and patellar tendon, lateral joint line NTTP over the quad tendon, medial fem condyle, patella, plica, patella tendon, tibial tuberostiy, fibular head, posterior fossa, pes anserine bursa, gerdy's tubercle, medial  jt line,  Special tests difficult to perform due to patient's tenderness Neg anterior and posterior drawer Neg lachman Neg sag sign Negative varus stress Negative valgus stress Negative [place]  Gait ataxic favoring left leg   Sports Medicine: Musculoskeletal Ultrasound.   Exam:Right [place] limited Knee Diagnosis: fall on right knee  Patellar tendon: Abnormal- edema in lateral tendon without tearing Quadriceps tendon:  Normal Pes anserine bursa/tendons: Normal Lateral structures: Normal Posterior knee:  Normal Effusion: absent Additional findings: soft tissue swelling in prepatellar bursa    Impression:  1. Pre-patellar bursitis and patellar tendon edema   Electronically signed by:  [name] D.[facility] 9:01 AM 04/04/21

## 2021-04-04 NOTE — Patient Instructions (Addendum)
Thank you for coming in today.   Please get an Xray today before you leave   Rest, ice, compress, and elevate for the next  2 days  Work on range of motion exercises after 2 days; View at [email or web address] using code: [email or web address]  Please use Voltaren gel (Generic Diclofenac Gel) up to 4x daily for pain as needed.  This is available over-the-counter as both the name brand Voltaren gel and the generic diclofenac gel.   Recheck back in 2 weeks.

## 2021-04-17 NOTE — Progress Notes (Signed)
Benito Mccreedy D.[place] Phone: [phone]   Assessment and Plan:   1. Acute pain of right knee 2. Prepatellar bursitis of right knee -Acute, improving, subsequent sports med visit - Significantly improved prepatellar bursitis, flaring whenever patient kneels - Continue Tylenol/NSAIDs topically as needed for pain control -Use knee cushioning if patient has to kneel, otherwise avoid   Pertinent previous records reviewed include previous x-ray, previous office note   Follow Up: As needed if no improvement or worsening of symptoms   Subjective:   I, Judy Pimple, am serving as a Education administrator for Dr. Glennon Mac  Chief Complaint: Right knee pain   HPI: 65 year old female presenting for a follow up of right knee pain   04/18/21 Patient states that her knee is better but she is having severe pain when she gets down on the ground to play with her grandchildren. Patient cannot kneel on her knee but she has not pain if she presses on her knee.    04/04/2021 C/O R knee pain that she injured this morning when falling. 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 Pt locates pain to all over  R knee, esp anteriorly. Pt reports pain is "sharp" and "severe."   Knee swelling: yes Mechanical symptoms: yes Treatment tried: none  Relevant Historical Information: None  Additional pertinent review of systems negative.   Current Outpatient Medications:    albuterol (VENTOLIN HFA) 108 (90 Base) MCG/ACT inhaler, Inhale 2 puffs into the lungs every 4 (four) hours as needed for wheezing or shortness of breath., Disp: 1 each, Rfl: 1   cetirizine (ZYRTEC) 10 MG tablet, Take 1 tablet (10 mg total) by mouth at bedtime., Disp: 90 tablet, Rfl: 3   Evolocumab (REPATHA SURECLICK) XX123456 MG/ML SOAJ, Inject 140 mg into the skin every 14 (fourteen) days., Disp: 2 pen, Rfl: 11   fluticasone  (FLONASE) 50 MCG/ACT nasal spray, SHAKE Liquid and Use 2 sprays in each nostril twice a day, Disp: , Rfl: 5   montelukast (SINGULAIR) 10 MG tablet, TAKE 1 TABLET(10 MG) BY MOUTH AT BEDTIME, Disp: 90 tablet, Rfl: 0   Nutritional Supplements (JUICE PLUS FIBRE PO), Take 4 each by mouth daily., Disp: , Rfl:    Vitamin D, Ergocalciferol, (DRISDOL) 1.25 MG (50000 UNIT) CAPS capsule, Take 1 capsule (50,000 Units total) by mouth every 7 (seven) days., Disp: 12 capsule, Rfl: 0   ezetimibe (ZETIA) 10 MG tablet, Take 1 tablet (10 mg total) by mouth daily., Disp: 90 tablet, Rfl: 3   losartan (COZAAR) 25 MG tablet, Take 1 tablet (25 mg total) by mouth daily., Disp: 90 tablet, Rfl: 3   Objective:     Vitals:   04/18/21 0751  BP: 130/84  Pulse: 87  SpO2: 97%  Height: '5\' 3"'$  (1.6 m)      Body mass index is 45.24 kg/m.    Physical Exam:    General:  awake, alert oriented, no acute distress nontoxic Skin: no suspicious lesions or rashes Neuro:sensation intact, no deficits, strength 5/5 with no deficits, no atrophy, normal muscle tone Psych: No signs of anxiety, depression or other mood disorder  Right knee: No swelling No deformity Neg fluid wave, joint milking ROM Flex 110, Ext 0 NTTP over the quad tendon, medial fem condyle, lat fem condyle, patella, plica, patella tendon, tibial tuberostiy, fibular head, posterior fossa, pes anserine bursa, gerdy's tubercle,  medial jt line, lateral jt line Neg anterior and posterior drawer Neg lachman Neg sag sign Negative varus stress Negative valgus stress Negative [place]  Gait normal    Electronically signed by:  Benito Mccreedy D.[facility] 8:21 AM 04/18/21

## 2021-04-18 ENCOUNTER — Ambulatory Visit: Payer: 59 | Admitting: Sports Medicine

## 2021-04-18 ENCOUNTER — Telehealth: Payer: Self-pay | Admitting: Internal Medicine

## 2021-04-18 ENCOUNTER — Other Ambulatory Visit: Payer: Self-pay

## 2021-04-18 VITALS — BP 130/84 | HR 87 | Ht 63.0 in

## 2021-04-18 DIAGNOSIS — M25561 Pain in right knee: Secondary | ICD-10-CM | POA: Diagnosis not present

## 2021-04-18 DIAGNOSIS — M7041 Prepatellar bursitis, right knee: Secondary | ICD-10-CM

## 2021-04-18 NOTE — Patient Instructions (Addendum)
Good to see you   See me again when you need me

## 2021-04-18 NOTE — Telephone Encounter (Signed)
Noted  

## 2021-04-18 NOTE — Telephone Encounter (Signed)
Type of form received: Physical form    Form placed in: Provider mailbox  Additional instructions from the patient : notify by phone when complete   Things to remember: [place] office: If form received in person, remind patient that forms take 7-10 business days CMA should attach charge sheet and put on Supervisor's desk

## 2021-04-24 ENCOUNTER — Telehealth: Payer: Self-pay

## 2021-04-24 NOTE — Telephone Encounter (Signed)
Called and lmomed the pt that we need insurance card to process pa as cmm will not allow me to do it online

## 2021-04-26 NOTE — Progress Notes (Signed)
HPI: FU hyperlipidemia and chest pain. Nuclear study 9/17 showed ejection fraction 63%. There was a small defect suggestive of ischemia in the apical lateral wall. Cardiac catheterization 11/17 showed normal LV function and normal coronary anatomy. Pt intolerant to statins and referred to lipid clinic; started on repatha.  Patient is seen with recurrent falls and question syncope December 2021.  Echocardiogram 1/22 showed normal LV function, grade 1 diastolic dysfunction and mildly dilated ascending aorta at 40 mm. Monitor 1/22 showed sinus bradycardia, normal sinus rhythm and sinus tachycardia.  Since last seen, she has some dyspnea that improves with inhalers.  She denies exertional chest pain.  No syncope.  Current Outpatient Medications  Medication Sig Dispense Refill   albuterol (VENTOLIN HFA) 108 (90 Base) MCG/ACT inhaler Inhale 2 puffs into the lungs every 4 (four) hours as needed for wheezing or shortness of breath. 1 each 1   cetirizine (ZYRTEC) 10 MG tablet Take 1 tablet (10 mg total) by mouth at bedtime. 90 tablet 3   Evolocumab (REPATHA SURECLICK) 109 MG/ML SOAJ Inject 140 mg into the skin every 14 (fourteen) days. 2 pen 11   fluticasone (FLONASE) 50 MCG/ACT nasal spray SHAKE Liquid and Use 2 sprays in each nostril twice a day  5   montelukast (SINGULAIR) 10 MG tablet TAKE 1 TABLET(10 MG) BY MOUTH AT BEDTIME 90 tablet 0   Nutritional Supplements (JUICE PLUS FIBRE PO) Take 4 each by mouth daily.     Vitamin D, Ergocalciferol, (DRISDOL) 1.25 MG (50000 UNIT) CAPS capsule Take 1 capsule (50,000 Units total) by mouth every 7 (seven) days. 12 capsule 0   ezetimibe (ZETIA) 10 MG tablet Take 1 tablet (10 mg total) by mouth daily. 90 tablet 3   losartan (COZAAR) 25 MG tablet Take 1 tablet (25 mg total) by mouth daily. 90 tablet 3   No current facility-administered medications for this visit.     Past Medical History:  Diagnosis Date   Allergy    Arthritis    neck, knees, ankles,  feet, back   Asthma    prn inhaler   Dental crowns present    Epigastric pain 06/11/2017   Essential hypertension 06/16/2008   Qualifier: Diagnosis of  By: Ronnald Ramp MD, Arvid Right.    Hallux valgus of right foot 05/2014   History of TIA (transient ischemic attack) 2004   Hyperglycemia 11/26/2007   Qualifier: Diagnosis of  By: Larose Kells MD, [place]    Hyperlipidemia    Hypertension    Limited joint range of motion    neck - states has bone spurs C2-5   Mild intermittent asthma 11/26/2007       Morbid obesity ([place]) 03/19/2016   TMJ syndrome     Past Surgical History:  Procedure Laterality Date   ACROMIONECTOMY Left 08/18/2008   APPENDECTOMY  1979   BUNIONECTOMY Left 07/17/2007   BUNIONECTOMY Right 05/26/2014   Procedure: RIGHT FOOT LAPIDUS BUNION CORRECTION AMD MODIFIED MCBRIDE BUNIONECTOMY;  Surgeon: Wylene Simmer, MD;  Location: [place];  Service: Orthopedics;  Laterality: Right;   CARDIAC CATHETERIZATION N/A 06/28/2016   Procedure: Left Heart Cath and Coronary Angiography;  Surgeon: Peter M Martinique, MD;  Location: [place] [facility];  Service: Cardiovascular;  Laterality: N/A;   CESAREAN SECTION  1986   correction of bunionectomy  2016   right foot    GANGLION CYST EXCISION     wrist   NASAL SEPTUM SURGERY  1981   ROTATOR CUFF REPAIR  W/ DISTAL CLAVICLE EXCISION Left 08/18/2008   SHOULDER ARTHROSCOPY Left 06/07/2008   thumb surgery  07-28-2015   left thumb knuckle collasped and had surgery    [place]    Social History   Socioeconomic History   Marital status: Married    Spouse name: Not on file   Number of children: 2   Years of education: Not on file   Highest education level: Not on file  Occupational History   Not on file  Tobacco Use   Smoking status: Former    Types: Cigarettes    Quit date: 09/12/2005    Years since quitting: 15.6   Smokeless tobacco: Never  Vaping Use   Vaping Use: Never used  Substance and Sexual  Activity   Alcohol use: Yes    Alcohol/week: 0.0 standard drinks    Comment: occasionally   Drug use: No   Sexual activity: Not on file  Other Topics Concern   Not on file  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   Financial Resource Strain: Not on file  Food Insecurity: Not on file  Transportation Needs: Not on file  Physical Activity: Not on file  Stress: Not on file  Social Connections: Not on file  Intimate Partner Violence: Not on file    Family History  Problem Relation Age of Onset   Cancer Mother        lung   Diabetes Mother    Thalassemia Mother    Colon polyps Mother    Cancer Brother        lymphatic   Heart disease Paternal Aunt    Atrial fibrillation Father    Thalassemia Sister    Thalassemia Maternal Aunt    Colon cancer Neg Hx    Rectal cancer Neg Hx    Stomach cancer Neg Hx     ROS: no fevers or chills, productive cough, hemoptysis, dysphasia, odynophagia, melena, hematochezia, dysuria, hematuria, rash, seizure activity, orthopnea, PND, pedal edema, claudication. Remaining systems are negative.  Physical Exam: Well-developed obese in no acute distress.  Skin is warm and dry.  HEENT is normal.  Neck is supple.  Chest is clear to auscultation with normal expansion.  Cardiovascular exam is regular rate and rhythm.  Abdominal exam nontender or distended. No masses palpated. Extremities show no edema. neuro grossly intact  ECG-normal sinus rhythm at a rate of 79, no ST changes.  Personally reviewed  A/P  1 history of chest pain-previous catheterization revealed no coronary disease and she has had no recurrences.  2 hyperlipidemia-continue Repatha.  3 dyspnea-felt secondary to deconditioning/obesity hypoventilation syndrome.  4 hypertension-patient's blood pressure is controlled.  Continue present medications and follow-up.  5 obesity-needs diet, exercise and weight loss.  6 history of recurrent falls/question  syncope-no recurrences since last office visit.  Kirk Ruths, MD

## 2021-05-01 ENCOUNTER — Telehealth: Payer: Self-pay

## 2021-05-01 NOTE — Telephone Encounter (Signed)
Pt has called in regards to her paperwork she has dropped off to be completed for her insurance company. Pt was informed of paperwork has been faxed. Pt will come and pick up a copy of confirmation sheet and paperwork on tomorrow 05/01/2021.

## 2021-05-01 NOTE — Telephone Encounter (Signed)
Paperwork has been placed upfront for pick up.

## 2021-05-02 ENCOUNTER — Ambulatory Visit: Payer: 59 | Admitting: Cardiology

## 2021-05-02 ENCOUNTER — Other Ambulatory Visit: Payer: Self-pay

## 2021-05-02 ENCOUNTER — Encounter: Payer: Self-pay | Admitting: Cardiology

## 2021-05-02 VITALS — BP 120/80 | HR 79 | Ht 63.0 in | Wt 252.0 lb

## 2021-05-02 DIAGNOSIS — E785 Hyperlipidemia, unspecified: Secondary | ICD-10-CM

## 2021-05-02 DIAGNOSIS — Z87898 Personal history of other specified conditions: Secondary | ICD-10-CM | POA: Diagnosis not present

## 2021-05-02 DIAGNOSIS — R0609 Other forms of dyspnea: Secondary | ICD-10-CM | POA: Diagnosis not present

## 2021-05-02 NOTE — Patient Instructions (Signed)

## 2021-05-06 ENCOUNTER — Other Ambulatory Visit: Payer: Self-pay | Admitting: Internal Medicine

## 2021-05-18 ENCOUNTER — Other Ambulatory Visit: Payer: Self-pay | Admitting: Obstetrics and Gynecology

## 2021-05-21 ENCOUNTER — Encounter (HOSPITAL_BASED_OUTPATIENT_CLINIC_OR_DEPARTMENT_OTHER): Payer: Self-pay | Admitting: Obstetrics and Gynecology

## 2021-05-21 ENCOUNTER — Other Ambulatory Visit: Payer: Self-pay

## 2021-05-21 NOTE — Progress Notes (Signed)
Spoke w/ via phone for pre-op interview---pt Lab needs dos----  cbc, bmp, t&s             Lab results------ current ekg in epic/ chart COVID test -----patient states asymptomatic no test needed Arrive at ------- 0815 on 05-24-2021 NPO after MN NO Solid Food.  Clear liquids from MN until--- 0715 Med rec completed Medications to take morning of surgery ----- zetia Diabetic medication ----- n/a Patient instructed no nail polish to be worn day of surgery Patient instructed to bring photo id and insurance card day of surgery Patient aware to have Driver (ride ) / caregiver for 24 hours after surgery --husband, Chrissie Noa Patient Special Instructions ----- asked to bring rescue inhaler dos Pre-Op special Istructions ----- n/a Patient verbalized understanding of instructions that were given at this phone interview. Patient denies shortness of breath, chest pain, fever, cough at this phone interview.    Anesthesia Review: HTN;  chronic dyspnea, baseline;  mild intermittent asthma; hx tia 2004 no resiudal;  limited rom of neck due to spurs per pt;  tmj syndrome;  Pt denies cardiac s&s, no peripheral swelling, but does have doe w/ stairs.  PCP: Dr Sharlet Salina Cassell Clement 03-26-2021 epic) Cardiologist : Dr [name] [facility] 05-02-2021 epic) GI:  Dr Collene Mares (lov 05/ 2022 per pt) Chest x-ray : 06-25-2020 epic EKG : 05-02-2021 epic Echo : 08-14-2020 epic Stress test: nuclear [phone] epic Cardiac Cath :  06-28-2016 epic Activity level:  see above Sleep Study/ CPAP : NO Blood Thinner/ Instructions Maryjane Hurter Dose:  no ASA / Instructions/ Last Dose :   no

## 2021-05-24 ENCOUNTER — Ambulatory Visit (HOSPITAL_BASED_OUTPATIENT_CLINIC_OR_DEPARTMENT_OTHER)
Admission: RE | Admit: 2021-05-24 | Discharge: 2021-05-24 | Disposition: A | Discharge status: Home / Self Care | Payer: 59 | Attending: Obstetrics and Gynecology | Admitting: Obstetrics and Gynecology

## 2021-05-24 ENCOUNTER — Encounter (HOSPITAL_BASED_OUTPATIENT_CLINIC_OR_DEPARTMENT_OTHER)
Admission: RE | Disposition: A | Discharge status: Home / Self Care | Payer: Self-pay | Source: Home / Self Care | Attending: Obstetrics and Gynecology

## 2021-05-24 ENCOUNTER — Ambulatory Visit (HOSPITAL_BASED_OUTPATIENT_CLINIC_OR_DEPARTMENT_OTHER): Payer: 59 | Admitting: Anesthesiology

## 2021-05-24 ENCOUNTER — Encounter (HOSPITAL_BASED_OUTPATIENT_CLINIC_OR_DEPARTMENT_OTHER): Payer: Self-pay | Admitting: Obstetrics and Gynecology

## 2021-05-24 DIAGNOSIS — Z880 Allergy status to penicillin: Secondary | ICD-10-CM | POA: Diagnosis not present

## 2021-05-24 DIAGNOSIS — N950 Postmenopausal bleeding: Secondary | ICD-10-CM | POA: Insufficient documentation

## 2021-05-24 DIAGNOSIS — Z885 Allergy status to narcotic agent status: Secondary | ICD-10-CM | POA: Diagnosis not present

## 2021-05-24 DIAGNOSIS — Z888 Allergy status to other drugs, medicaments and biological substances status: Secondary | ICD-10-CM | POA: Insufficient documentation

## 2021-05-24 DIAGNOSIS — N882 Stricture and stenosis of cervix uteri: Secondary | ICD-10-CM | POA: Diagnosis not present

## 2021-05-24 DIAGNOSIS — Z87891 Personal history of nicotine dependence: Secondary | ICD-10-CM | POA: Insufficient documentation

## 2021-05-24 DIAGNOSIS — Z8616 Personal history of COVID-19: Secondary | ICD-10-CM | POA: Diagnosis not present

## 2021-05-24 DIAGNOSIS — N952 Postmenopausal atrophic vaginitis: Secondary | ICD-10-CM | POA: Diagnosis not present

## 2021-05-24 DIAGNOSIS — Z881 Allergy status to other antibiotic agents status: Secondary | ICD-10-CM | POA: Diagnosis not present

## 2021-05-24 DIAGNOSIS — Z79899 Other long term (current) drug therapy: Secondary | ICD-10-CM | POA: Diagnosis not present

## 2021-05-24 DIAGNOSIS — N8502 Endometrial intraepithelial neoplasia [EIN]: Secondary | ICD-10-CM | POA: Insufficient documentation

## 2021-05-24 DIAGNOSIS — Z8673 Personal history of transient ischemic attack (TIA), and cerebral infarction without residual deficits: Secondary | ICD-10-CM | POA: Diagnosis not present

## 2021-05-24 HISTORY — DX: Stress incontinence (female) (male): N39.3

## 2021-05-24 HISTORY — DX: Mild intermittent asthma, uncomplicated: J45.20

## 2021-05-24 HISTORY — DX: Frequency of micturition: R35.0

## 2021-05-24 HISTORY — DX: Abnormal findings on diagnostic imaging of other specified body structures: R93.89

## 2021-05-24 HISTORY — DX: Spondylosis without myelopathy or radiculopathy, cervical region: M47.812

## 2021-05-24 HISTORY — DX: Other forms of dyspnea: R06.09

## 2021-05-24 HISTORY — DX: Stricture and stenosis of cervix uteri: N88.2

## 2021-05-24 HISTORY — DX: Postmenopausal bleeding: N95.0

## 2021-05-24 HISTORY — DX: Personal history of other specified conditions: Z87.898

## 2021-05-24 HISTORY — DX: Diverticulosis of large intestine without perforation or abscess without bleeding: K57.30

## 2021-05-24 HISTORY — DX: Fatty (change of) liver, not elsewhere classified: K76.0

## 2021-05-24 HISTORY — PX: HYSTEROSCOPY WITH D & C: SHX1775

## 2021-05-24 HISTORY — DX: Personal history of COVID-19: Z86.16

## 2021-05-24 HISTORY — DX: Torticollis: M43.6

## 2021-05-24 HISTORY — DX: Personal history of other diseases of the digestive system: Z87.19

## 2021-05-24 HISTORY — DX: Prediabetes: R73.03

## 2021-05-24 HISTORY — DX: Solitary pulmonary nodule: R91.1

## 2021-05-24 HISTORY — DX: Presence of spectacles and contact lenses: Z97.3

## 2021-05-24 LAB — BASIC METABOLIC PANEL
Anion gap: 10 (ref 5–15)
BUN: 18 mg/dL (ref 8–23)
CO2: 23 mmol/L (ref 22–32)
Calcium: 9.2 mg/dL (ref 8.9–10.3)
Chloride: 104 mmol/L (ref 98–111)
Creatinine, Ser: 0.84 mg/dL (ref 0.44–1.00)
GFR, Estimated: >60 mL/min (ref >60)
Glucose, Bld: 117 mg/dL — ABNORMAL HIGH (ref 70–99)
Potassium: 4.1 mmol/L (ref 3.5–5.1)
Sodium: 137 mmol/L (ref 135–145)

## 2021-05-24 LAB — CBC
HCT: 42.9 % (ref 36.0–46.0)
Hemoglobin: 14.1 g/dL (ref 12.0–15.0)
MCH: 29.3 pg (ref 26.0–34.0)
MCHC: 32.9 g/dL (ref 30.0–36.0)
MCV: 89 fL (ref 80.0–100.0)
Platelets: 204 10*3/uL (ref 150–400)
RBC: 4.82 MIL/uL (ref 3.87–5.11)
RDW: 13.3 % (ref 11.5–15.5)
WBC: 6.1 10*3/uL (ref 4.0–10.5)
nRBC: 0 % (ref 0.0–0.2)

## 2021-05-24 LAB — ABO/RH: ABO/RH(D): O POS

## 2021-05-24 LAB — TYPE AND SCREEN
ABO/RH(D): O POS
Antibody Screen: NEGATIVE

## 2021-05-24 SURGERY — DILATATION AND CURETTAGE /HYSTEROSCOPY
Anesthesia: General | Site: Vagina

## 2021-05-24 MED ORDER — MIDAZOLAM HCL 2 MG/2ML IJ SOLN
INTRAMUSCULAR | Status: DC | PRN
  Administered 2021-05-24: 2 mg via INTRAVENOUS

## 2021-05-24 MED ORDER — ONDANSETRON HCL 4 MG/2ML IJ SOLN
INTRAMUSCULAR | Status: DC | PRN
  Administered 2021-05-24: 4 mg via INTRAVENOUS

## 2021-05-24 MED ORDER — MEPERIDINE HCL 25 MG/ML IJ SOLN: 6.2500 mg | INTRAMUSCULAR | Status: DC | PRN

## 2021-05-24 MED ORDER — DEXAMETHASONE SODIUM PHOSPHATE 4 MG/ML IJ SOLN
INTRAMUSCULAR | Status: DC | PRN
  Administered 2021-05-24: 8 mg via INTRAVENOUS

## 2021-05-24 MED ORDER — PROPOFOL 10 MG/ML IV BOLUS
INTRAVENOUS | Status: AC
  Filled 2021-05-24: qty 40

## 2021-05-24 MED ORDER — LIDOCAINE HCL 1 % IJ SOLN
INTRAMUSCULAR | Status: DC | PRN
  Administered 2021-05-24: 10 mL

## 2021-05-24 MED ORDER — MIDAZOLAM HCL 2 MG/2ML IJ SOLN
INTRAMUSCULAR | Status: AC
  Filled 2021-05-24: qty 2

## 2021-05-24 MED ORDER — FENTANYL CITRATE (PF) 100 MCG/2ML IJ SOLN
INTRAMUSCULAR | Status: AC
  Filled 2021-05-24: qty 2

## 2021-05-24 MED ORDER — LIDOCAINE HCL (CARDIAC) PF 100 MG/5ML IV SOSY
PREFILLED_SYRINGE | INTRAVENOUS | Status: DC | PRN
  Administered 2021-05-24: 100 mg via INTRAVENOUS

## 2021-05-24 MED ORDER — KETOROLAC TROMETHAMINE 30 MG/ML IJ SOLN
INTRAMUSCULAR | Status: DC | PRN
  Administered 2021-05-24: 30 mg via INTRAVENOUS

## 2021-05-24 MED ORDER — FENTANYL CITRATE (PF) 100 MCG/2ML IJ SOLN
INTRAMUSCULAR | Status: DC | PRN
  Administered 2021-05-24: 25 ug via INTRAVENOUS

## 2021-05-24 MED ORDER — LACTATED RINGERS IV SOLN: INTRAVENOUS | Status: DC

## 2021-05-24 MED ORDER — IBUPROFEN 800 MG PO TABS: 800.0000 mg | ORAL_TABLET | Freq: Three times a day (TID) | ORAL | 0 refills | Status: DC | PRN

## 2021-05-24 MED ORDER — DROPERIDOL 2.5 MG/ML IJ SOLN: 0.6250 mg | Freq: Once | INTRAMUSCULAR | Status: DC | PRN

## 2021-05-24 MED ORDER — FENTANYL CITRATE (PF) 100 MCG/2ML IJ SOLN
25.0000 ug | INTRAMUSCULAR | Status: DC | PRN
  Administered 2021-05-24: 50 ug via INTRAVENOUS
  Administered 2021-05-24: 25 ug via INTRAVENOUS

## 2021-05-24 MED ORDER — PROPOFOL 10 MG/ML IV BOLUS
INTRAVENOUS | Status: DC | PRN
  Administered 2021-05-24: 50 mg via INTRAVENOUS
  Administered 2021-05-24: 200 mg via INTRAVENOUS

## 2021-05-24 MED ORDER — POVIDONE-IODINE 10 % EX SWAB: 2.0000 "application " | Freq: Once | CUTANEOUS | Status: DC

## 2021-05-24 MED ORDER — SODIUM CHLORIDE 0.9 % IR SOLN
Status: DC | PRN
  Administered 2021-05-24: 3000 mL

## 2021-05-24 SURGICAL SUPPLY — 17 items
CATH ROBINSON RED A/P 16FR (CATHETERS) ×2 IMPLANT
DEVICE MYOSURE LITE (MISCELLANEOUS) ×2 IMPLANT
DEVICE MYOSURE REACH (MISCELLANEOUS) IMPLANT
GAUZE 4X4 16PLY ~~LOC~~+RFID DBL (SPONGE) ×2 IMPLANT
GLOVE SURG LTX SZ6.5 (GLOVE) ×2 IMPLANT
GLOVE SURG UNDER POLY LF SZ7 (GLOVE) ×4 IMPLANT
GOWN STRL REUS W/TWL LRG LVL3 (GOWN DISPOSABLE) ×4 IMPLANT
IV NS 1000ML (IV SOLUTION) ×6
IV NS 1000ML BAXH (IV SOLUTION) ×3 IMPLANT
KIT PROCEDURE FLUENT (KITS) ×2 IMPLANT
KIT TURNOVER CYSTO (KITS) ×2 IMPLANT
PACK VAGINAL MINOR WOMEN LF (CUSTOM PROCEDURE TRAY) ×2 IMPLANT
PAD OB MATERNITY 4.3X12.25 (PERSONAL CARE ITEMS) ×2 IMPLANT
SEAL CERVICAL OMNI LOK (ABLATOR) IMPLANT
SEAL ROD LENS SCOPE MYOSURE (ABLATOR) ×2 IMPLANT
TOWEL OR 17X26 10 PK STRL BLUE (TOWEL DISPOSABLE) ×4 IMPLANT
UNDERPAD 30X36 HEAVY ABSORB (UNDERPADS AND DIAPERS) ×2 IMPLANT

## 2021-05-24 NOTE — Anesthesia Preprocedure Evaluation (Signed)
Anesthesia Evaluation  Patient identified by MRN, date of birth, ID band  Reviewed: Allergy & Precautions, NPO status , Patient's Chart, lab work & pertinent test results  Airway Mallampati: II  TM Distance: >3 FB Neck ROM: Full    Dental no notable dental hx. (+) Dental Advisory Given, Teeth Intact   Pulmonary asthma , former smoker,    Pulmonary exam normal breath sounds clear to auscultation       Cardiovascular hypertension, negative cardio ROS Normal cardiovascular exam Rhythm:Regular Rate:Normal     Neuro/Psych TIA   GI/Hepatic Neg liver ROS, GERD  ,  Endo/Other  Morbid obesity  Renal/GU negative Renal ROS     Musculoskeletal  (+) Arthritis ,   Abdominal (+) + obese,   Peds  Hematology negative hematology ROS (+)   Anesthesia Other Findings   Reproductive/Obstetrics                            Anesthesia Physical Anesthesia Plan  ASA: 3  Anesthesia Plan: General   Post-op Pain Management:    Induction: Intravenous  PONV Risk Score and Plan: Ondansetron, Treatment may vary due to age or medical condition, Midazolam and Dexamethasone  Airway Management Planned: LMA  Additional Equipment:   Intra-op Plan:   Post-operative Plan: Extubation in OR  Informed Consent: I have reviewed the patients History and Physical, chart, labs and discussed the procedure including the risks, benefits and alternatives for the proposed anesthesia with the patient or authorized representative who has indicated his/her understanding and acceptance.     Dental advisory given  Plan Discussed with: CRNA  Anesthesia Plan Comments:         Anesthesia Quick Evaluation

## 2021-05-24 NOTE — H&P (Signed)
[name] is an 65 y.o. female presenting for scheduled surgery.  Patient is a 39Y P55 menopausal female presenting for hysteroscopy, D&C for PMB and thickened endometrium  Menopause around age 62 Denies any VB or spotting since then until August 2022 Bleeding had been off and on initially, but then presented to office on 04/26/21 complaining of persistent bleeding for 2 weeks -Heavier more red in AM but turns lighter and more pink by the end of day No recent changes in medications. No blood thinners or HRT Has not been sexually active in over 10 years d/t husband's health status Patient was not able to stay for physical examination on 04/26/21 visit and returned on 05/17/21 where TVUS showed: anteverted uterus 9.1x5.7x4.3 cm thickened ES 17 mm w/ internal vascularity; rt ov simple cyst 1.0 cm, nml lt ov. no FF Attempt at in office EMB was performed but due to significant cervical stenosis was not successful  Last Pap 2016 NILM/-HPV EMB 2013 benign 2013 TVUS ES 35mm possible small polyp CT Abd/Pelvis 2019 unremarkable uterus and ovaries  Had been having abdominal pain, saw GI -Colonoscopy July 2022 with Dr. Elwyn Lade small polyps removed per pt  Patient medical history significant for HTN and asthma well controlled on medications   Menstrual History: No LMP recorded. Patient is postmenopausal.    Past Medical History:  Diagnosis Date   Arthritis    neck, knees, ankles, feet, back   Asthma, mild intermittent    prn inhaler   Cervical spondylosis    Chronic dyspnea    per pt gets winded w/ stairs, but recovers quickly   Diverticular disease of colon    followed by dr Collene Mares (GI)---  chronic w/ chronic diarrhea   Essential hypertension    Fatty infiltration of liver    followed by dr Collene Mares--- abd ultrasound in epic 08-31-2020   Frequency of urination    History of chest pain    evaluated by cardiologist-- dr [name],  11/ 2017 nuclear stress test suggest ischemia,  06/2016  cardiac cath , showed normal coronary anatomy and lvf   History of COVID-19    per pt in 2019   treated with presumed covid had  mild to moderate symptoms that resolved   History of diverticulitis of colon    05/ 2022   History of TIA (transient ischemic attack) 06/2003   Hyperlipidemia    Hypertension    Limitation of joint motion of neck    per pt due to bone spurs C2-5   Mild intermittent asthma    followed by pcp   PMB (postmenopausal bleeding)    Pre-diabetes    Pulmonary nodule    followed by pcp,  last hest CT in epic 04-21-2018   Stenosis of cervix    SUI (stress urinary incontinence, female)    Thickened endometrium    TMJ syndrome    Wears glasses     Past Surgical History:  Procedure Laterality Date   APPENDECTOMY  1979   BREAST ENHANCEMENT SURGERY Bilateral 1988   per pt implants removed 2002   BUNIONECTOMY Left 07/17/2007   @[facility]    BUNIONECTOMY Right 05/26/2014   Procedure: RIGHT FOOT LAPIDUS BUNION CORRECTION AMD MODIFIED MCBRIDE BUNIONECTOMY;  Surgeon: Wylene Simmer, MD;  Location: [place];  Service: Orthopedics;  Laterality: Right;   CARDIAC CATHETERIZATION N/A 06/28/2016   Procedure: Left Heart Cath and Coronary Angiography;  Surgeon: Peter M Martinique, MD;  Location: [place] [facility];  Service: Cardiovascular;  Laterality:  N/A;   [employer]   COLONOSCOPY  12/27/2020   by dr Collene Mares   FINGER SURGERY Left 07/28/2015   @[facility]  :  left thumb "knuckle collasped"  arthroplasty   FOOT HARDWARE REMOVAL  04/25/2016   @[facility] ;   right foot   FOOT SURGERY Right 04/04/2015   @[facility] ;   first and second toe's   GANGLION CYST EXCISION Right 2016   wrist   NASAL SEPTUM SURGERY  1981   ROTATOR CUFF REPAIR W/ DISTAL CLAVICLE EXCISION Left 08/18/2008   @[facility]  by dr Shellia Carwin   SHOULDER ARTHROSCOPY W/ LABRAL REPAIR Left 06/07/2008   @[facility] ;  and [facility]AD  by dr Shellia Carwin   TONSILLECTOMY  1964   TUBAL LIGATION Bilateral 1999    Family History  Problem  Relation Age of Onset   Cancer Mother        lung   Diabetes Mother    Thalassemia Mother    Colon polyps Mother    Cancer Brother        lymphatic   Heart disease Paternal Aunt    Atrial fibrillation Father    Thalassemia Sister    Thalassemia Maternal Aunt    Colon cancer Neg Hx    Rectal cancer Neg Hx    Stomach cancer Neg Hx     Social History:  reports that [name] quit smoking about 15 years ago. Her smoking use included cigarettes. [name] has never used smokeless tobacco. [name] reports that [name] does not currently use alcohol. [name] reports that [name] does not use drugs.  Allergies:  Allergies  Allergen Reactions   Doxycycline Diarrhea and Nausea Only    Nausea and diarrhea started a few hours after taking medication.  Diarrhea and nausea continue 2 days after discontinuation.   Hydrocodone Bit-Homatrop Mbr Itching, Anxiety and Other (See Comments)    Made pt confused    Penicillins Shortness Of Breath and Swelling    TONGUE SWELLING Has patient had a PCN reaction causing immediate rash, facial/tongue/throat swelling, SOB or lightheadedness with hypotension: Unknown Has patient had a PCN reaction causing severe rash involving mucus membranes or skin necrosis: Unknown Has patient had a PCN reaction that required hospitalization: Was already in the hospital when happened Has patient had a PCN reaction occurring within the last 10 years: Yes able to take Augmentin If all of the above answers are "NO", then may proc   Promethazine Hcl Shortness Of Breath   Clarithromycin Nausea And Vomiting   Statins Other (See Comments)    LEG CRAMPS   Kiwi Extract Swelling    Medications Prior to Admission  Medication Sig Dispense Refill Last Dose   albuterol (VENTOLIN HFA) 108 (90 Base) MCG/ACT inhaler Inhale 2 puffs into the lungs every 4 (four) hours as needed for wheezing or shortness of breath. (Patient taking differently: Inhale 2 puffs into the lungs every 4 (four) hours as needed for wheezing  or shortness of breath.) 1 each 1 05/23/2021   cetirizine (ZYRTEC) 10 MG tablet Take 1 tablet (10 mg total) by mouth at bedtime. 90 tablet 3 05/23/2021   Evolocumab (REPATHA SURECLICK) 956 MG/ML SOAJ Inject 140 mg into the skin every 14 (fourteen) days. 2 pen 11 05/20/2021   ezetimibe (ZETIA) 10 MG tablet Take 1 tablet (10 mg total) by mouth daily. (Patient taking differently: Take 10 mg by mouth daily.) 90 tablet 3 05/24/2021 at 0600   fluticasone (FLONASE) 50 MCG/ACT nasal spray Place 2 sprays into both nostrils 2 (two) times daily  as needed.  5 05/23/2021   losartan (COZAAR) 25 MG tablet Take 1 tablet (25 mg total) by mouth daily. (Patient taking differently: Take 25 mg by mouth daily.) 90 tablet 3 05/23/2021   montelukast (SINGULAIR) 10 MG tablet TAKE 1 TABLET(10 MG) BY MOUTH AT BEDTIME (Patient taking differently: Take 10 mg by mouth at bedtime.) 90 tablet 0 05/23/2021   Nutritional Supplements (JUICE PLUS FIBRE PO) Take 4 each by mouth daily.   05/23/2021   Vitamin D, Ergocalciferol, (DRISDOL) 1.25 MG (50000 UNIT) CAPS capsule Take 1 capsule (50,000 Units total) by mouth every 7 (seven) days. (Patient taking differently: Take 50,000 Units by mouth every 7 (seven) days. Monday's) 12 capsule 0 05/21/2021    Review of Systems  All other systems reviewed and are negative.  Blood pressure (!) 154/99, pulse 76, temperature 98.1 F (36.7 C), temperature source Oral, resp. rate 17, height 5\' 3"  (1.6 m), weight 117.8 kg, SpO2 98 %. Physical Exam Vitals reviewed.  Constitutional:      Appearance: Normal appearance.  HENT:     Head: Normocephalic.  Cardiovascular:     Rate and Rhythm: Normal rate.  Pulmonary:     Effort: Pulmonary effort is normal.  Abdominal:     Palpations: Abdomen is soft.     Tenderness: There is no abdominal tenderness.  Genitourinary:    General: Normal vulva.  Musculoskeletal:        General: Normal range of motion.  Skin:    General: Skin is warm and dry.   Neurological:     General: No focal deficit present.     Mental Status: [name] is alert and oriented to person, place, and time.  Psychiatric:        Mood and Affect: Mood normal.        Behavior: Behavior normal.    Assessment/Plan:  65Y P1021 menopausal female presenting for scheduled hysteroscopy, D&C procedure for PMB and thickened endometrium  Patient has been consented both in the office and again in the preop holding area for the above procedure. [name] has been counseled on surgery risks including but not limited to bleeding, infection, damage to surrounding organs, and risks of anesthesia. [name] has verbalized understanding and consents signed.  -Admit to OR -No antibiotics indicated -SCD VTE ppx -Routine intraop/postop care -[place] home today. DC instructions reviewed and has scheduled postop visit in the office in 2 weeks  Hartwell Vandiver A Kaceton Vieau 05/24/2021, 9:01 AM

## 2021-05-24 NOTE — Anesthesia Procedure Notes (Signed)
Procedure Name: LMA Insertion Date/Time: 05/24/2021 9:47 AM Performed by: Georgeanne Nim, CRNA Pre-anesthesia Checklist: Patient identified, Emergency Drugs available, Suction available, Patient being monitored and Timeout performed Patient Re-evaluated:Patient Re-evaluated prior to induction Oxygen Delivery Method: Circle system utilized Preoxygenation: Pre-oxygenation with 100% oxygen Induction Type: IV induction LMA: LMA inserted LMA Size: 4.0 Number of attempts: 1 Placement Confirmation: positive ETCO2, CO2 detector and breath sounds checked- equal and bilateral Tube secured with: Tape Dental Injury: Teeth and Oropharynx as per pre-operative assessment

## 2021-05-24 NOTE — Transfer of Care (Signed)
Immediate Anesthesia Transfer of Care Note  Patient: [name]  Procedure(s) Performed: DILATATION AND CURETTAGE /HYSTEROSCOPY and MyoSure (Vagina )  Patient Location: PACU  Anesthesia Type:General  Level of Consciousness: drowsy  Airway & Oxygen Therapy: Patient Spontanous Breathing and Patient connected to nasal cannula oxygen  Post-op Assessment: Report given to RN and Post -op Vital signs reviewed and stable  Post vital signs: Reviewed and stable  Last Vitals:  Vitals Value Taken Time  BP 138/102 05/24/21 1030  Temp    Pulse 79 05/24/21 1031  Resp 17 05/24/21 1031  SpO2 100 % 05/24/21 1031  Vitals shown include unvalidated device data.  Last Pain:  Vitals:   05/24/21 0838  TempSrc: Oral  PainSc: 4       Patients Stated Pain Goal: 2 (82/99/37 1696)  Complications: No notable events documented.

## 2021-05-24 NOTE — Anesthesia Postprocedure Evaluation (Signed)
Anesthesia Post Note  Patient: [name]  Procedure(s) Performed: DILATATION AND CURETTAGE /HYSTEROSCOPY and MyoSure (Vagina )     Patient location during evaluation: PACU Anesthesia Type: General Level of consciousness: sedated and patient cooperative Pain management: pain level controlled Vital Signs Assessment: post-procedure vital signs reviewed and stable Respiratory status: spontaneous breathing Cardiovascular status: stable Anesthetic complications: no   No notable events documented.  Last Vitals:  Vitals:   05/24/21 1130 05/24/21 1142  BP:  (!) 142/86  Pulse: 76 67  Resp: 15 16  Temp:  (!) 36.1 C  SpO2: 92% 95%    Last Pain:  Vitals:   05/24/21 1142  TempSrc:   PainSc: [place]

## 2021-05-24 NOTE — Op Note (Signed)
[name] 12-05-1955 [id number]  Operative Note  PROCEDURE: operative hysteroscopy, dilation and curettage   PRE-OPERATIVE DIAGNOSIS:  Postmenopausal bleeding Thickened endometrium Cervical stenosis  POST-OPERATIVE DIAGNOSIS: Postmenopausal bleeding Thickened endometrium  SURGEON: Dr. Langley Gauss, DO  ASSISTANT: N/A  FINDINGS: atrophic vaginal epithelium, normal appearing ectocervix without lesions, stenotic cervical os, uterine cavity with bilateral tubal ostia visualized, arcuate shaped uterine cavity with possible small partial septum, irregularly thickened endometrium with increased vascularity and some areas of calcification noted especially on the anterior wall  SPECIMENS: endometrial curettings  EBL: minimal  FLUIDS: Deficit 790 mL  COMPLICATIONS: None  PROCEDURE IN DETAIL:   After the patient was appropriately consented in the holding area, she was taken to the operating room where general anesthesia was administered without complications. The patient was placed in the dorsal lithotomy position. The patient was prepped and draped in the usual sterile fashion. The bladder had been drained spontaneously just prior to entering OR. An appropriate time out was performed that verified the correct patient, procedure, and surgical team.   A sterile speculum was inserted into the vagina and the cervix was visualized. A single tooth tenaculum was used to grasp the anterior lip of the cervix. A paracervical block was obtained using 1% plain lidocaine injecting a total of 10 cc divided between the 4 and 8 o'clock positions. The cervix was sequentially dilated up to a size 19 french. The hysteroscope was introduced through the cervix and advanced to the uterine fundus. The uterine cavity distension was obtained without difficulty and the findings were noted as described above. The MyoSure 'Light' was utilized to resect the thickened endometrium under direct visualization. The  hysteroscopy was removed. A gentle sharp curetting was performed and specimens collected on a Telfa. All specimens were sent for final pathology. The tenaculum was removed and excellent hemostasis was appreciated. Speculum was removed. Patient tolerated the procedure well and was taken to the recovery room in stable condition. All instrument and lap counts were correct.  [name] [name] 05/24/21 10:30 AM

## 2021-05-24 NOTE — Discharge Instructions (Addendum)
DISCHARGE INSTRUCTIONS: D&C / D&E The following instructions have been prepared to help you care for yourself upon your return home.   Personal hygiene:  Use sanitary pads for vaginal drainage, not tampons.  Shower the day after your procedure.  NO tub baths, pools or Jacuzzis for 2-3 weeks.  Wipe front to back after using the bathroom.  Activity and limitations:  Do NOT drive or operate any equipment for 24 hours. The effects of anesthesia are still present and drowsiness may result.  Do NOT rest in bed all day.  Walking is encouraged.  Walk up and down stairs slowly.  You may resume your normal activity in one to two days or as indicated by your physician.  Sexual activity: NO intercourse for at least 2 weeks after the procedure, or as indicated by your physician.  Diet: Eat a light meal as desired this evening. You may resume your usual diet tomorrow.  Return to work: You may resume your work activities in one to two days or as indicated by your doctor.  What to expect after your surgery: Expect to have vaginal bleeding/discharge for 2-3 days and spotting for up to 10 days. It is not unusual to have soreness for up to 1-2 weeks. You may have a slight burning sensation when you urinate for the first day. Mild cramps may continue for a couple of days. You may have a regular period in 2-6 weeks.  Call your doctor for any of the following:  Excessive vaginal bleeding, saturating and changing one pad every hour.  Inability to urinate 6 hours after discharge from hospital.  Pain not relieved by pain medication.  Fever of 100.4 F or greater.  Unusual vaginal discharge or odor.   Call for an appointment:      Post Anesthesia Home Care Instructions  Activity: Get plenty of rest for the remainder of the day. A responsible adult should stay with you for 24 hours following the procedure.  For the next 24 hours, DO NOT: -Drive a car -Operate machinery -Drink alcoholic  beverages -Take any medication unless instructed by your physician -Make any legal decisions or sign important papers.  Meals: Start with liquid foods such as gelatin or soup. Progress to regular foods as tolerated. Avoid greasy, spicy, heavy foods. If nausea and/or vomiting occur, drink only clear liquids until the nausea and/or vomiting subsides. Call your physician if vomiting continues.  Special Instructions/Symptoms: Your throat may feel dry or sore from the anesthesia or the breathing tube placed in your throat during surgery. If this causes discomfort, gargle with warm salt water. The discomfort should disappear within 24 hours.   

## 2021-05-25 ENCOUNTER — Encounter (HOSPITAL_BASED_OUTPATIENT_CLINIC_OR_DEPARTMENT_OTHER): Payer: Self-pay | Admitting: Obstetrics and Gynecology

## 2021-05-25 LAB — SURGICAL PATHOLOGY

## 2021-06-11 DIAGNOSIS — N8502 Endometrial intraepithelial neoplasia [EIN]: Secondary | ICD-10-CM | POA: Insufficient documentation

## 2021-06-19 DIAGNOSIS — I639 Cerebral infarction, unspecified: Secondary | ICD-10-CM | POA: Insufficient documentation

## 2021-06-19 DIAGNOSIS — J45991 Cough variant asthma: Secondary | ICD-10-CM | POA: Insufficient documentation

## 2021-06-19 DIAGNOSIS — J45909 Unspecified asthma, uncomplicated: Secondary | ICD-10-CM | POA: Insufficient documentation

## 2021-07-02 ENCOUNTER — Other Ambulatory Visit (INDEPENDENT_AMBULATORY_CARE_PROVIDER_SITE_OTHER): Payer: 59

## 2021-07-02 ENCOUNTER — Telehealth: Payer: Self-pay | Admitting: Internal Medicine

## 2021-07-02 DIAGNOSIS — R300 Dysuria: Secondary | ICD-10-CM

## 2021-07-02 LAB — URINALYSIS, ROUTINE W REFLEX MICROSCOPIC
Bilirubin Urine: NEGATIVE
Hgb urine dipstick: NEGATIVE
Ketones, ur: NEGATIVE
Nitrite: NEGATIVE
Specific Gravity, Urine: 1.01 (ref 1.000–1.030)
Total Protein, Urine: NEGATIVE
Urine Glucose: NEGATIVE
Urobilinogen, UA: 0.2 (ref 0.0–1.0)
pH: 5 (ref 5.0–8.0)

## 2021-07-02 NOTE — Telephone Encounter (Signed)
Patient calling in  Had surgery at [facility] 06/26/21  Patient says she thinks she has a possible UTI & spoke w/ her oncologist & they advised her to check w/ provider about having urine tested here instead of having patient drive all the way to [facility]  Patient wants Dr. Sharlet Salina to put in order for urinalysis so she can come leave sample  Please call patient once order has been placed [phone]

## 2021-07-02 NOTE — Telephone Encounter (Signed)
Orders placed.

## 2021-07-03 ENCOUNTER — Emergency Department (HOSPITAL_BASED_OUTPATIENT_CLINIC_OR_DEPARTMENT_OTHER)
Admission: EM | Admit: 2021-07-03 | Discharge: 2021-07-03 | Disposition: A | Discharge status: Home / Self Care | Payer: 59 | Attending: Emergency Medicine | Admitting: Emergency Medicine

## 2021-07-03 ENCOUNTER — Telehealth: Payer: Self-pay | Admitting: Internal Medicine

## 2021-07-03 ENCOUNTER — Emergency Department (HOSPITAL_BASED_OUTPATIENT_CLINIC_OR_DEPARTMENT_OTHER): Payer: 59

## 2021-07-03 ENCOUNTER — Encounter (HOSPITAL_BASED_OUTPATIENT_CLINIC_OR_DEPARTMENT_OTHER): Payer: Self-pay | Admitting: *Deleted

## 2021-07-03 ENCOUNTER — Other Ambulatory Visit: Payer: Self-pay

## 2021-07-03 DIAGNOSIS — Z20822 Contact with and (suspected) exposure to covid-19: Secondary | ICD-10-CM | POA: Diagnosis not present

## 2021-07-03 DIAGNOSIS — R1032 Left lower quadrant pain: Secondary | ICD-10-CM | POA: Insufficient documentation

## 2021-07-03 DIAGNOSIS — I10 Essential (primary) hypertension: Secondary | ICD-10-CM | POA: Diagnosis not present

## 2021-07-03 DIAGNOSIS — Z8616 Personal history of COVID-19: Secondary | ICD-10-CM | POA: Diagnosis not present

## 2021-07-03 DIAGNOSIS — R509 Fever, unspecified: Secondary | ICD-10-CM | POA: Insufficient documentation

## 2021-07-03 DIAGNOSIS — Z79899 Other long term (current) drug therapy: Secondary | ICD-10-CM | POA: Insufficient documentation

## 2021-07-03 DIAGNOSIS — Z87891 Personal history of nicotine dependence: Secondary | ICD-10-CM | POA: Diagnosis not present

## 2021-07-03 DIAGNOSIS — J4520 Mild intermittent asthma, uncomplicated: Secondary | ICD-10-CM | POA: Diagnosis not present

## 2021-07-03 DIAGNOSIS — R000 Tachycardia, unspecified: Secondary | ICD-10-CM | POA: Diagnosis not present

## 2021-07-03 LAB — RESP PANEL BY RT-PCR (FLU A&B, COVID) ARPGX2
Influenza A by PCR: NEGATIVE
Influenza B by PCR: NEGATIVE
SARS Coronavirus 2 by RT PCR: NEGATIVE

## 2021-07-03 LAB — URINALYSIS, ROUTINE W REFLEX MICROSCOPIC
Bilirubin Urine: NEGATIVE
Glucose, UA: NEGATIVE mg/dL
Ketones, ur: NEGATIVE mg/dL
Nitrite: NEGATIVE
Protein, ur: NEGATIVE mg/dL
Specific Gravity, Urine: 1.02 (ref 1.005–1.030)
pH: 5 (ref 5.0–8.0)

## 2021-07-03 LAB — CBC WITH DIFFERENTIAL/PLATELET
Abs Immature Granulocytes: 0.06 10*3/uL (ref 0.00–0.07)
Basophils Absolute: 0.1 10*3/uL (ref 0.0–0.1)
Basophils Relative: 1 %
Eosinophils Absolute: 0.3 10*3/uL (ref 0.0–0.5)
Eosinophils Relative: 3 %
HCT: 43.8 % (ref 36.0–46.0)
Hemoglobin: 14.7 g/dL (ref 12.0–15.0)
Immature Granulocytes: 1 %
Lymphocytes Relative: 8 %
Lymphs Abs: 0.8 10*3/uL (ref 0.7–4.0)
MCH: 29.5 pg (ref 26.0–34.0)
MCHC: 33.6 g/dL (ref 30.0–36.0)
MCV: 87.8 fL (ref 80.0–100.0)
Monocytes Absolute: 0.5 10*3/uL (ref 0.1–1.0)
Monocytes Relative: 6 %
Neutro Abs: 7.7 10*3/uL (ref 1.7–7.7)
Neutrophils Relative %: 81 %
Platelets: 233 10*3/uL (ref 150–400)
RBC: 4.99 MIL/uL (ref 3.87–5.11)
RDW: 13.3 % (ref 11.5–15.5)
WBC: 9.4 10*3/uL (ref 4.0–10.5)
nRBC: 0 % (ref 0.0–0.2)

## 2021-07-03 LAB — COMPREHENSIVE METABOLIC PANEL
ALT: 24 U/L (ref 0–44)
AST: 21 U/L (ref 15–41)
Albumin: 4.5 g/dL (ref 3.5–5.0)
Alkaline Phosphatase: 90 U/L (ref 38–126)
Anion gap: 9 (ref 5–15)
BUN: 19 mg/dL (ref 8–23)
CO2: 25 mmol/L (ref 22–32)
Calcium: 9.3 mg/dL (ref 8.9–10.3)
Chloride: 100 mmol/L (ref 98–111)
Creatinine, Ser: 1.14 mg/dL — ABNORMAL HIGH (ref 0.44–1.00)
GFR, Estimated: 54 mL/min — ABNORMAL LOW (ref >60)
Glucose, Bld: 104 mg/dL — ABNORMAL HIGH (ref 70–99)
Potassium: 4 mmol/L (ref 3.5–5.1)
Sodium: 134 mmol/L — ABNORMAL LOW (ref 135–145)
Total Bilirubin: 0.8 mg/dL (ref 0.3–1.2)
Total Protein: 8.3 g/dL — ABNORMAL HIGH (ref 6.5–8.1)

## 2021-07-03 LAB — PROTIME-INR
INR: 1 (ref 0.8–1.2)
Prothrombin Time: 13.6 seconds (ref 11.4–15.2)

## 2021-07-03 LAB — APTT: aPTT: 33 seconds (ref 24–36)

## 2021-07-03 LAB — URINE CULTURE: Result:: NO GROWTH

## 2021-07-03 LAB — URINALYSIS, MICROSCOPIC (REFLEX)

## 2021-07-03 LAB — GROUP A STREP BY PCR: Group A Strep by PCR: NOT DETECTED

## 2021-07-03 LAB — LIPASE, BLOOD: Lipase: 26 U/L (ref 11–51)

## 2021-07-03 LAB — LACTIC ACID, PLASMA: Lactic Acid, Venous: 1.1 mmol/L (ref 0.5–1.9)

## 2021-07-03 MED ORDER — LACTATED RINGERS IV BOLUS (SEPSIS)
1000.0000 mL | Freq: Once | INTRAVENOUS | Status: AC
  Administered 2021-07-03: 1000 mL via INTRAVENOUS

## 2021-07-03 MED ORDER — IOHEXOL 300 MG/ML  SOLN
100.0000 mL | Freq: Once | INTRAMUSCULAR | Status: AC | PRN
  Administered 2021-07-03: 100 mL via INTRAVENOUS

## 2021-07-03 MED ORDER — METRONIDAZOLE 500 MG/100ML IV SOLN
500.0000 mg | Freq: Once | INTRAVENOUS | Status: AC
  Administered 2021-07-03: 500 mg via INTRAVENOUS

## 2021-07-03 MED ORDER — SODIUM CHLORIDE 0.9 % IV SOLN
2.0000 g | Freq: Once | INTRAVENOUS | Status: AC
  Administered 2021-07-03: 2 g via INTRAVENOUS

## 2021-07-03 MED ORDER — ACETAMINOPHEN 325 MG PO TABS
650.0000 mg | ORAL_TABLET | Freq: Once | ORAL | Status: AC
  Administered 2021-07-03: 650 mg via ORAL

## 2021-07-03 MED ORDER — SODIUM CHLORIDE 0.9 % IV SOLN: 2.0000 g | Freq: Three times a day (TID) | INTRAVENOUS | Status: DC

## 2021-07-03 MED ORDER — IBUPROFEN 400 MG PO TABS
400.0000 mg | ORAL_TABLET | Freq: Once | ORAL | Status: AC
  Administered 2021-07-03: 400 mg via ORAL

## 2021-07-03 NOTE — ED Notes (Signed)
Pt wants to wait on work up until speaking with provider, r/t unable to stay because primary caretaker of husband at home.   Pt afebrile at this time Denies pain, no weakness noted

## 2021-07-03 NOTE — Telephone Encounter (Signed)
U/A results are in epic. See result note.

## 2021-07-03 NOTE — ED Notes (Signed)
Patient transported to CT 

## 2021-07-03 NOTE — ED Notes (Signed)
Pt states had laparoscopic hysterectomy 1 week ago, incision sites wnl, no redness noted, pt states temp yesterday and today, denies pain, states feels bladder spasms.  Pcp sent here for flu test r/t fever.  Pt states UA done with pcp yesterday and was negative.

## 2021-07-03 NOTE — ED Triage Notes (Addendum)
Fever since last night. Chills today. She had a hysterectomy a week ago. She had a negative urine test yesterday. Tylenol and hour ago.

## 2021-07-03 NOTE — Telephone Encounter (Signed)
Patient states she has a fever of 104.00, patient states she has a runny nose and sore throat, patient states she tested neg for covid on 11-29  Offered patient an ov at another [facility] location, patient declined  Patient transferred to [facility]

## 2021-07-03 NOTE — ED Notes (Signed)
ED Provider at bedside. 

## 2021-07-03 NOTE — ED Provider Notes (Signed)
[facility] EMERGENCY DEPARTMENT Provider Note   CSN: [id number] Arrival date & time: 07/03/21  1641     History Chief Complaint  Patient presents with   Fever    [name] is a 65 y.o. female.  HPI     65yo female with history of total hysterectomy, bilateral salpingo-oophrectomy 11/22 with Dr. Fransisca Connors at [facility] for endometrial intraepithealial neoplasia , history of htn, hlpd, diverticulitis, NASH, TIA, who presents with concern for fever, with bladder spasms since surgery and sore throat.   Bladder has been spasming since she had surgery, they recommended getting UA, had results last night, looked ok, cx not back yet. Irritation with urinating, but also feeling spasms of bladder all day long.  Last night felt so cold.  Checked temp was 99.3.  went to bed had nightmares, husband just had open heart surgery, went to his appointment but prior to leaving was freezing, chills, temperature was 99.9, took tylenol and went back to sleep, 104 tempearture today. Called Dr. Charlynne Cousins office, wanted testing for covid influenza, triage nurse recommended going to ED.  Sore throat started this AM, funny nose as day goes on  Husband has dementia and is recovering from open heart surgery No cough, no nausea/vomiting/diarrhea  Past Medical History:  Diagnosis Date   Arthritis    neck, knees, ankles, feet, back   Asthma, mild intermittent    prn inhaler   Cervical spondylosis    Chronic dyspnea    per pt gets winded w/ stairs, but recovers quickly   Diverticular disease of colon    followed by dr Collene Mares (GI)---  chronic w/ chronic diarrhea   Essential hypertension    Fatty infiltration of liver    followed by dr Collene Mares--- abd ultrasound in epic 08-31-2020   Frequency of urination    History of chest pain    evaluated by cardiologist-- dr [name],  11/ 2017 nuclear stress test suggest ischemia,  06/2016 cardiac cath , showed normal coronary anatomy and lvf   History of COVID-19     per pt in 2019   treated with presumed covid had  mild to moderate symptoms that resolved   History of diverticulitis of colon    05/ 2022   History of TIA (transient ischemic attack) 06/2003   Hyperlipidemia    Hypertension    Limitation of joint motion of neck    per pt due to bone spurs C2-5   Mild intermittent asthma    followed by pcp   PMB (postmenopausal bleeding)    Pre-diabetes    Pulmonary nodule    followed by pcp,  last hest CT in epic 04-21-2018   Stenosis of cervix    SUI (stress urinary incontinence, female)    Thickened endometrium    TMJ syndrome    Wears glasses     Patient Active Problem List   Diagnosis Date Noted   Diverticulitis 05/15/2018   Morbid obesity ([place]) 03/19/2016   History of TIA (transient ischemic attack) 03/14/2015   Routine general medical examination at a health care facility 11/23/2012   Low back pain 11/02/2012   Neuralgia 03/16/2012   GERD 06/14/2009   Essential hypertension 06/16/2008   Hyperlipidemia 12/29/2007   Hyperglycemia 11/26/2007   Mild intermittent asthma 11/26/2007    Past Surgical History:  Procedure Laterality Date   APPENDECTOMY  1979   BREAST ENHANCEMENT SURGERY Bilateral 1988   per pt implants removed 2002   BUNIONECTOMY Left 07/17/2007   @[facility]   BUNIONECTOMY Right 05/26/2014   Procedure: RIGHT FOOT LAPIDUS BUNION CORRECTION AMD MODIFIED MCBRIDE BUNIONECTOMY;  Surgeon: Wylene Simmer, MD;  Location: [facility];  Service: Orthopedics;  Laterality: Right;   CARDIAC CATHETERIZATION N/A 06/28/2016   Procedure: Left Heart Cath and Coronary Angiography;  Surgeon: Peter M Martinique, MD;  Location: [facility];  Service: Cardiovascular;  Laterality: N/A;   CESAREAN SECTION  1986   COLONOSCOPY  12/27/2020   by dr Collene Mares   FINGER SURGERY Left 07/28/2015   @[facility]  :  left thumb "knuckle collasped"  arthroplasty   FOOT HARDWARE REMOVAL  04/25/2016   @[facility] ;   right foot   FOOT SURGERY Right 04/04/2015    @[facility] ;   first and second toe's   GANGLION CYST EXCISION Right 2016   wrist   HYSTEROSCOPY WITH D & C N/A 05/24/2021   Procedure: DILATATION AND CURETTAGE /HYSTEROSCOPY and MyoSure;  Surgeon: Armandina Stammer, DO;  Location: [facility];  Service: Gynecology;  Laterality: N/A;   NASAL SEPTUM SURGERY  1981   ROTATOR CUFF REPAIR W/ DISTAL CLAVICLE EXCISION Left 08/18/2008   @[facility]  by dr Shellia Carwin   SHOULDER ARTHROSCOPY W/ LABRAL REPAIR Left 06/07/2008   @[facility] ;  and SAD  by dr Shellia Carwin   TONSILLECTOMY  1964   TUBAL LIGATION Bilateral 1999     OB History   No obstetric history on file.     Family History  Problem Relation Age of Onset   Cancer Mother        lung   Diabetes Mother    Thalassemia Mother    Colon polyps Mother    Cancer Brother        lymphatic   Heart disease Paternal Aunt    Atrial fibrillation Father    Thalassemia Sister    Thalassemia Maternal Aunt    Colon cancer Neg Hx    Rectal cancer Neg Hx    Stomach cancer Neg Hx     Social History   Tobacco Use   Smoking status: Former    Years: 30.00    Types: Cigarettes    Quit date: 09/12/2005    Years since quitting: 15.8   Smokeless tobacco: Never  Vaping Use   Vaping Use: Never used  Substance Use Topics   Alcohol use: Not Currently    Comment: rare   Drug use: No    Home Medications Prior to Admission medications   Medication Sig Start Date End Date Taking? Authorizing Provider  albuterol (VENTOLIN HFA) 108 (90 Base) MCG/ACT inhaler Inhale 2 puffs into the lungs every 4 (four) hours as needed for wheezing or shortness of breath. Patient taking differently: Inhale 2 puffs into the lungs every 4 (four) hours as needed for wheezing or shortness of breath. 08/21/20   Hoyt Koch, MD  cetirizine (ZYRTEC) 10 MG tablet Take 1 tablet (10 mg total) by mouth at bedtime. 01/23/21   Saguier, Percell Miller, PA-C  Evolocumab (REPATHA SURECLICK) 865 MG/ML SOAJ Inject 140 mg into the skin every 14  (fourteen) days. 03/03/20   Hoyt Koch, MD  ezetimibe (ZETIA) 10 MG tablet Take 1 tablet (10 mg total) by mouth daily. Patient taking differently: Take 10 mg by mouth daily. 10/31/20 05/24/21  Lelon Perla, MD  fluticasone (FLONASE) 50 MCG/ACT nasal spray Place 2 sprays into both nostrils 2 (two) times daily as needed. 08/23/16   [provider]  ibuprofen (ADVIL) 800 MG tablet Take 1 tablet (800 mg total) by  mouth every 8 (eight) hours as needed. 05/24/21   Law, Cassandra A, DO  losartan (COZAAR) 25 MG tablet Take 1 tablet (25 mg total) by mouth daily. Patient taking differently: Take 25 mg by mouth daily. 08/08/20 05/24/21  Sande Rives E, PA-C  montelukast (SINGULAIR) 10 MG tablet TAKE 1 TABLET(10 MG) BY MOUTH AT BEDTIME Patient taking differently: Take 10 mg by mouth at bedtime. 05/08/21   Hoyt Koch, MD  Nutritional Supplements (JUICE PLUS FIBRE PO) Take 4 each by mouth daily.    [provider]  Vitamin D, Ergocalciferol, (DRISDOL) 1.25 MG (50000 UNIT) CAPS capsule Take 1 capsule (50,000 Units total) by mouth every 7 (seven) days. Patient taking differently: Take 50,000 Units by mouth every 7 (seven) days. Monday's 03/30/21   Hoyt Koch, MD    Allergies    Doxycycline, Hydrocodone bit-homatrop mbr, Penicillins, Promethazine hcl, Clarithromycin, Statins, and Kiwi extract  Review of Systems   Review of Systems  Constitutional:  Positive for appetite change, fatigue and fever.  HENT:  Positive for rhinorrhea and sore throat.   Eyes:  Negative for visual disturbance.  Respiratory:  Negative for cough and shortness of breath.   Cardiovascular:  Negative for chest pain.  Gastrointestinal:  Positive for abdominal pain. Negative for constipation, diarrhea and vomiting.  Genitourinary:  Positive for flank pain. Negative for difficulty urinating.  Musculoskeletal:  Positive for back pain. Negative for neck pain.  Skin:  Negative for rash.   Neurological:  Negative for syncope and headaches.   Physical Exam Updated Vital Signs BP 125/79   Pulse 97   Temp 98.5 F (36.9 C) (Oral)   Resp 19   Ht 5\' 3"  (1.6 m)   Wt 116.6 kg   SpO2 99%   BMI 45.53 kg/m   Physical Exam Vitals and nursing note reviewed.  Constitutional:      General: She is not in acute distress.    Appearance: She is well-developed. She is not diaphoretic.  HENT:     Head: Normocephalic and atraumatic.  Eyes:     Conjunctiva/sclera: Conjunctivae normal.  Cardiovascular:     Rate and Rhythm: Regular rhythm. Tachycardia present.     Heart sounds: Normal heart sounds. No murmur heard.   No friction rub. No gallop.  Pulmonary:     Effort: Pulmonary effort is normal. No respiratory distress.     Breath sounds: Normal breath sounds. No wheezing or rales.  Abdominal:     General: There is no distension.     Palpations: Abdomen is soft.     Tenderness: There is abdominal tenderness (suprapubic and LLQ). There is no guarding.  Musculoskeletal:        General: No tenderness.     Cervical back: Normal range of motion.  Skin:    General: Skin is warm and dry.     Findings: No erythema or rash.  Neurological:     Mental Status: She is alert and oriented to person, place, and time.    ED Results / Procedures / Treatments   Labs (all labs ordered are listed, but only abnormal results are displayed) Labs Reviewed  COMPREHENSIVE METABOLIC PANEL - Abnormal; Notable for the following components:      Result Value   Sodium 134 (*)    Glucose, Bld 104 (*)    Creatinine, Ser 1.14 (*)    Total Protein 8.3 (*)    GFR, Estimated 54 (*)    All other components within normal limits  URINALYSIS, ROUTINE W REFLEX MICROSCOPIC - Abnormal; Notable for the following components:   Hgb urine dipstick SMALL (*)    Leukocytes,Ua SMALL (*)    All other components within normal limits  URINALYSIS, MICROSCOPIC (REFLEX) - Abnormal; Notable for the following components:    Bacteria, UA RARE (*)    All other components within normal limits  RESP PANEL BY RT-PCR (FLU A&B, COVID) [id number]  GROUP A STREP BY PCR  CULTURE, BLOOD (ROUTINE X 2)  CULTURE, BLOOD (ROUTINE X 2)  URINE CULTURE  LIPASE, BLOOD  LACTIC ACID, PLASMA  CBC WITH DIFFERENTIAL/PLATELET  PROTIME-INR  APTT    EKG EKG Interpretation  Date/Time:  Tuesday July 03 2021 20:33:12 EST Ventricular Rate:  99 PR Interval:  144 QRS Duration: 87 QT Interval:  348 QTC Calculation: 447 R Axis:   0 Text Interpretation: Sinus rhythm Consider left ventricular hypertrophy No significant change since last tracing Confirmed by Gareth Morgan [phone]) on 07/03/2021 8:36:56 PM  Radiology CT ABDOMEN PELVIS W CONTRAST  Result Date: 07/03/2021 CLINICAL DATA:  Status post laparoscopic hysterectomy 1 week ago with recent fever. EXAM: CT ABDOMEN AND PELVIS WITH CONTRAST TECHNIQUE: Multidetector CT imaging of the abdomen and pelvis was performed using the standard protocol following bolus administration of intravenous contrast. CONTRAST:  164mL OMNIPAQUE IOHEXOL 300 MG/ML  SOLN COMPARISON:  May 06, 2018 FINDINGS: Lower chest: No acute abnormality. Hepatobiliary: There is diffuse fatty infiltration of the liver parenchyma. A 1.0 cm well-defined focus of parenchymal low attenuation is seen within the left lobe of the liver. Additional 1.0 cm and 0.7 cm similar appearing areas are noted within the right lobe. No gallstones, gallbladder wall thickening, or biliary dilatation. Pancreas: Unremarkable. No pancreatic ductal dilatation or surrounding inflammatory changes. Spleen: Normal in size without focal abnormality. Adrenals/Urinary Tract: Adrenal glands are unremarkable. Kidneys are normal, without renal calculi, focal lesion, or hydronephrosis. The urinary bladder is partially contracted and subsequently limited in evaluation. Mild diffuse urinary bladder wall thickening is also seen. Stomach/Bowel: Stomach is within  normal limits. Appendix appears normal. No evidence of bowel wall thickening, distention, or inflammatory changes. Noninflamed diverticula are seen within the descending and sigmoid colon. Vascular/Lymphatic: Aortic atherosclerosis. No enlarged abdominal or pelvic lymph nodes. Reproductive: Status post recent hysterectomy with a mild amount of inflammatory fat stranding noted within the surgical bed. No adnexal masses. Other: There is a 2.0 cm x 1.1 cm fat containing left-sided para umbilical fat hernia. No abdominopelvic ascites. Musculoskeletal: There is no evidence of acute osseous abnormality. Approximately 4 mm anterolisthesis of the L4 vertebral body is noted on L5. IMPRESSION: 1. Status post recent hysterectomy with a mild amount of residual inflammation noted within the surgical bed. 2. Mild diffuse urinary bladder wall thickening, which may represent sequelae associated with cystitis. Correlation with urinalysis is recommended. 3. Colonic diverticulosis. 4. Small fat containing left-sided para umbilical fat hernia. 5. Hepatic steatosis. 6. Small hepatic cysts versus hemangiomas. Correlation with nonemergent hepatic ultrasound is recommended. 7. Approximately 4 mm anterolisthesis of the L4 vertebral body on L5. 8. Aortic atherosclerosis. Aortic Atherosclerosis (ICD10-I70.0). Electronically Signed   By: Virgina Norfolk M.D.   On: 07/03/2021 21:57   DG Chest Port 1 View  Result Date: 07/03/2021 CLINICAL DATA:  Questionable sepsis. EXAM: PORTABLE CHEST 1 VIEW COMPARISON:  Chest x-ray 06/25/2020. FINDINGS: There is minimal linear atelectasis in the left lower lung. There is no pleural effusion or pneumothorax. The cardiomediastinal silhouette is within normal limits. No acute fractures are seen. IMPRESSION: Minimal  left basilar atelectasis. Electronically Signed   By: Ronney Asters M.D.   On: 07/03/2021 20:14    Procedures Procedures   Medications Ordered in ED Medications  ibuprofen (ADVIL) tablet  400 mg (400 mg Oral Given 07/03/21 1703)  lactated ringers bolus 1,000 mL (0 mLs Intravenous Stopped 07/03/21 2203)  ceFEPIme (MAXIPIME) 2 g in sodium chloride 0.9 % 100 mL IVPB (0 g Intravenous Stopped 07/03/21 2117)  metroNIDAZOLE (FLAGYL) IVPB 500 mg (0 mg Intravenous Stopped 07/03/21 2240)  acetaminophen (TYLENOL) tablet 650 mg (650 mg Oral Given 07/03/21 2040)  iohexol (OMNIPAQUE) 300 MG/ML solution 100 mL (100 mLs Intravenous Contrast Given 07/03/21 2125)    ED Course  I have reviewed the triage vital signs and the nursing notes.  Pertinent labs & imaging results that were available during my care of the patient were reviewed by me and considered in my medical decision making (see chart for details).    MDM Rules/Calculators/A&P                            65yo female with history of total hysterectomy, bilateral salpingo-oophrectomy 11/22 with Dr. Fransisca Connors at [facility] for endometrial intraepithealial neoplasia , history of htn, hlpd, diverticulitis, NASH, TIA, who presents with concern for fever, with bladder spasms since surgery and sore throat.  Concern for possible sepsis with fever and tachycardia on arrival and given IV fluid, cefepime/flagyl for possible intraabdominal infection as source given recent surgery.    CT returned showing mild amount of residual inflammation within the surgical bed, mild diffuse bladder wall thickening.   UA again does not look infected, culture came back negative from yesterday, doubt urinary source of fever.  Strep, covid/influenza testing negative.  Given sore throat, fever may be secondary to other viral process. Do not see signs of epiglottitis/RPA/PTA.   HR improved with fluid, fever control, vital signs normal. Lactic acid WNL, no leukocytosis, no other significant lab abnormalities.    Recommend follow up with her surgeon in setting of fever. Offered to call and discuss inflammation in surgical bed which may be expected at this point with her  surgeon however she reports she needs to return to her husband. Given other symptoms suggesting possible viral process as etiology of fever and stability feel she is appropriate for outpatient follow up with her surgeon and PCP. Patient discharged in stable condition with understanding of reasons to return.      Final Clinical Impression(s) / ED Diagnoses Final diagnoses:  Fever, unspecified fever cause    Rx / DC Orders ED Discharge Orders     None        Gareth Morgan, MD 07/04/21 [phone]

## 2021-07-03 NOTE — Progress Notes (Addendum)
Pharmacy Antibiotic Note  [name] is a 65 y.o. female admitted on 07/03/2021. Patient presenting with fever. Pharmacy has been consulted for cefepime dosing for intra-abdominal infection  SCr 1.14 - above baseline WBC 9.4; LA 1.1; afeb  Plan: Metronidazole per MD Cefepime 2g q8hr Trend WBC, Fever, Renal function, & Clinical course F/u cultures, clinical course, WBC, fever De-escalate when able  Height: 5\' 3"  (160 cm) Weight: 116.6 kg (257 lb) IBW/kg (Calculated) : 52.4  Temp (24hrs), Avg:99.8 F (37.7 C), Min:98.5 F (36.9 C), Max:101 F (38.3 C)  No results for input(s): WBC, CREATININE, LATICACIDVEN, VANCOTROUGH, VANCOPEAK, VANCORANDOM, GENTTROUGH, GENTPEAK, GENTRANDOM, TOBRATROUGH, TOBRAPEAK, TOBRARND, AMIKACINPEAK, AMIKACINTROU, AMIKACIN in the last 168 hours.  CrCl cannot be calculated (Patient's most recent lab result is older than the maximum 21 days allowed.).    Allergies  Allergen Reactions   Doxycycline Diarrhea and Nausea Only    Nausea and diarrhea started a few hours after taking medication.  Diarrhea and nausea continue 2 days after discontinuation.   Hydrocodone Bit-Homatrop Mbr Itching, Anxiety and Other (See Comments)    Made pt confused    Penicillins Shortness Of Breath and Swelling    TONGUE SWELLING Has patient had a PCN reaction causing immediate rash, facial/tongue/throat swelling, SOB or lightheadedness with hypotension: Unknown Has patient had a PCN reaction causing severe rash involving mucus membranes or skin necrosis: Unknown Has patient had a PCN reaction that required hospitalization: Was already in the hospital when happened Has patient had a PCN reaction occurring within the last 10 years: Yes able to take Augmentin If all of the above answers are "NO", then may proc   Promethazine Hcl Shortness Of Breath   Clarithromycin Nausea And Vomiting   Statins Other (See Comments)    LEG CRAMPS   Kiwi Extract Swelling    Antimicrobials this  admission: cefepime 11/29 >>  metronidazole 11/29 >>   Microbiology results: Pending  Thank you for allowing pharmacy to be a part of this patient's care.  Lorelei Pont, PharmD, BCPS 07/03/2021 7:33 PM ED Clinical Pharmacist -  [phone]

## 2021-07-04 NOTE — Telephone Encounter (Signed)
[facility] FYI...   Caller states they have a fever: 104, sore throat, and runny nose. Post op Oncology surgery last Tuesday @ [facility]; full hysterectomy and lymph node removal.  Advised to go to ED now. Caller understood and decided to go to ED on [address].   Additional comments:  Surgery: 06/26/21. Reports she had bladder spasms post op and they haven't resolved. Reports yesterday they ordered UA; completed and clear. Culture pending. Reports low grade temp started today and she has been taking tylenol. Pre cancerous biopsy led to procedure. Sore throat 6(0-10)

## 2021-07-05 LAB — URINE CULTURE: Culture: 50000 — AB

## 2021-07-09 LAB — CULTURE, BLOOD (ROUTINE X 2)
Culture: NO GROWTH
Culture: NO GROWTH
Special Requests: ADEQUATE
Special Requests: ADEQUATE

## 2021-07-10 ENCOUNTER — Encounter: Payer: Self-pay | Admitting: Family Medicine

## 2021-07-10 ENCOUNTER — Telehealth (INDEPENDENT_AMBULATORY_CARE_PROVIDER_SITE_OTHER): Payer: 59 | Admitting: Family Medicine

## 2021-07-10 VITALS — Temp 98.6°F

## 2021-07-10 DIAGNOSIS — R059 Cough, unspecified: Secondary | ICD-10-CM | POA: Diagnosis not present

## 2021-07-10 DIAGNOSIS — R0981 Nasal congestion: Secondary | ICD-10-CM

## 2021-07-10 MED ORDER — FLUTICASONE PROPIONATE HFA 44 MCG/ACT IN AERO: 2.0000 | INHALATION_SPRAY | Freq: Two times a day (BID) | RESPIRATORY_TRACT | 0 refills | Status: DC | PRN

## 2021-07-10 MED ORDER — BENZONATATE 100 MG PO CAPS: ORAL_CAPSULE | ORAL | 0 refills | Status: DC

## 2021-07-10 NOTE — Patient Instructions (Signed)
-  I sent the medication(s) we discussed to your pharmacy: Meds ordered this encounter  Medications   fluticasone (FLOVENT HFA) 44 MCG/ACT inhaler    Sig: Inhale 2 puffs into the lungs every 12 (twelve) hours as needed.    Dispense:  1 each    Refill:  0   benzonatate (TESSALON PERLES) 100 MG capsule    Sig: 1-2 capsules up to twice daily for cough    Dispense:  20 capsule    Refill:  0     I hope you are feeling better soon!  Seek in person care promptly if your symptoms worsen, new concerns arise or you are not improving with treatment.  It was nice to meet you today. I help [name] out with telemedicine visits on Tuesdays and Thursdays and am available for visits on those days. If you have any concerns or questions following this visit please schedule a follow up visit with your Primary Care doctor or seek care at a local urgent care clinic to avoid delays in care.

## 2021-07-10 NOTE — Progress Notes (Signed)
Virtual Visit via Video Note  I connected with [name]  on 07/10/21 at  6:20 PM EST by a video enabled telemedicine application and verified that I am speaking with the correct person using two identifiers.  Location patient: home, [place] Location provider:work or home office Persons participating in the virtual visit: patient, provider  I discussed the limitations of evaluation and management by telemedicine and the availability of in person appointments. The patient expressed understanding and agreed to proceed.   HPI:  Acute telemedicine visit for : -Onset: 1 week ago - was seen in the ER and had an extensive evaluation as had a surgery earlier last month -Symptoms include: sore throat, nasal congestion, fever initially, reports lots of nasal congestion, cough, body aches initially, some diarrhea, poor appetite, wheezing - has asthma and has been using her albuterol -fevers and body aches have resolved, still has cough and nasal congestion that is clear -covid tests negative x3 -Denies: CP, SOB, NV, thick mucus, fevers any longer -Pertinent past medical history: see below -Pertinent medication allergies:  Allergies  Allergen Reactions   Doxycycline Diarrhea and Nausea Only    Nausea and diarrhea started a few hours after taking medication.  Diarrhea and nausea continue 2 days after discontinuation.   Hydrocodone Bit-Homatrop Mbr Itching, Anxiety and Other (See Comments)    Made pt confused    Penicillins Shortness Of Breath and Swelling    TONGUE SWELLING Has patient had a PCN reaction causing immediate rash, facial/tongue/throat swelling, SOB or lightheadedness with hypotension: Unknown Has patient had a PCN reaction causing severe rash involving mucus membranes or skin necrosis: Unknown Has patient had a PCN reaction that required hospitalization: Was already in the hospital when happened Has patient had a PCN reaction occurring within the last 10 years: Yes able to take Augmentin If  all of the above answers are "NO", then may proc   Promethazine Hcl Shortness Of Breath   Clarithromycin Nausea And Vomiting   Statins Other (See Comments)    LEG CRAMPS   Kiwi Extract Swelling  -COVID-19 vaccine status:  Immunization History  Administered Date(s) Administered   Influenza Whole 06/16/2008   PFIZER(Purple Top)SARS-COV-2 Vaccination 08/25/2019, 09/15/2019, 08/11/2020   Pneumococcal Polysaccharide-23 07/05/2004   Td 08/06/1999   Tdap 08/19/2008, 01/31/2020   Zoster Recombinat (Shingrix) 03/26/2021    ROS: See pertinent positives and negatives per HPI.  Past Medical History:  Diagnosis Date   Arthritis    neck, knees, ankles, feet, back   Asthma, mild intermittent    prn inhaler   Cervical spondylosis    Chronic dyspnea    per pt gets winded w/ stairs, but recovers quickly   Diverticular disease of colon    followed by dr Collene Mares (GI)---  chronic w/ chronic diarrhea   Essential hypertension    Fatty infiltration of liver    followed by dr Collene Mares--- abd ultrasound in epic 08-31-2020   Frequency of urination    History of chest pain    evaluated by cardiologist-- dr [name],  11/ 2017 nuclear stress test suggest ischemia,  06/2016 cardiac cath , showed normal coronary anatomy and lvf   History of COVID-19    per pt in 2019   treated with presumed covid had  mild to moderate symptoms that resolved   History of diverticulitis of colon    05/ 2022   History of TIA (transient ischemic attack) 06/2003   Hyperlipidemia    Hypertension    Limitation of joint motion of neck  per pt due to bone spurs C2-5   Mild intermittent asthma    followed by pcp   PMB (postmenopausal bleeding)    Pre-diabetes    Pulmonary nodule    followed by pcp,  last hest CT in epic 04-21-2018   Stenosis of cervix    SUI (stress urinary incontinence, female)    Thickened endometrium    TMJ syndrome    Wears glasses     Past Surgical History:  Procedure Laterality Date    APPENDECTOMY  1979   BREAST ENHANCEMENT SURGERY Bilateral 1988   per pt implants removed 2002   BUNIONECTOMY Left 07/17/2007   @[facility]    BUNIONECTOMY Right 05/26/2014   Procedure: RIGHT FOOT LAPIDUS BUNION CORRECTION AMD MODIFIED MCBRIDE BUNIONECTOMY;  Surgeon: Wylene Simmer, MD;  Location: [place];  Service: Orthopedics;  Laterality: Right;   CARDIAC CATHETERIZATION N/A 06/28/2016   Procedure: Left Heart Cath and Coronary Angiography;  Surgeon: Peter M Martinique, MD;  Location: [facility];  Service: Cardiovascular;  Laterality: N/A;   CESAREAN SECTION  1986   COLONOSCOPY  12/27/2020   by dr Collene Mares   FINGER SURGERY Left 07/28/2015   @[facility]  :  left thumb "knuckle collasped"  arthroplasty   FOOT HARDWARE REMOVAL  04/25/2016   @[facility] ;   right foot   FOOT SURGERY Right 04/04/2015   @[facility] ;   first and second toe's   GANGLION CYST EXCISION Right 2016   wrist   HYSTEROSCOPY WITH D & C N/A 05/24/2021   Procedure: DILATATION AND CURETTAGE /HYSTEROSCOPY and MyoSure;  Surgeon: Armandina Stammer, DO;  Location: [place];  Service: Gynecology;  Laterality: N/A;   NASAL SEPTUM SURGERY  1981   ROTATOR CUFF REPAIR W/ DISTAL CLAVICLE EXCISION Left 08/18/2008   @[facility]  by dr Shellia Carwin   SHOULDER ARTHROSCOPY W/ LABRAL REPAIR Left 06/07/2008   @[facility] ;  and SAD  by dr Shellia Carwin   TONSILLECTOMY  1964   TUBAL LIGATION Bilateral 1999     Current Outpatient Medications:    albuterol (VENTOLIN HFA) 108 (90 Base) MCG/ACT inhaler, Inhale 2 puffs into the lungs every 4 (four) hours as needed for wheezing or shortness of breath. (Patient taking differently: Inhale 2 puffs into the lungs every 4 (four) hours as needed for wheezing or shortness of breath.), Disp: 1 each, Rfl: 1   benzonatate (TESSALON PERLES) 100 MG capsule, 1-2 capsules up to twice daily for cough, Disp: 20 capsule, Rfl: 0   cetirizine (ZYRTEC) 10 MG tablet, Take 1 tablet (10 mg total) by mouth at bedtime., Disp: 90  tablet, Rfl: 3   Evolocumab (REPATHA SURECLICK) 528 MG/ML SOAJ, Inject 140 mg into the skin every 14 (fourteen) days., Disp: 2 pen, Rfl: 11   fluticasone (FLONASE) 50 MCG/ACT nasal spray, Place 2 sprays into both nostrils 2 (two) times daily as needed., Disp: , Rfl: 5   fluticasone (FLOVENT HFA) 44 MCG/ACT inhaler, Inhale 2 puffs into the lungs every 12 (twelve) hours as needed., Disp: 1 each, Rfl: 0   ibuprofen (ADVIL) 800 MG tablet, Take 1 tablet (800 mg total) by mouth every 8 (eight) hours as needed., Disp: 30 tablet, Rfl: 0   montelukast (SINGULAIR) 10 MG tablet, TAKE 1 TABLET(10 MG) BY MOUTH AT BEDTIME (Patient taking differently: Take 10 mg by mouth at bedtime.), Disp: 90 tablet, Rfl: 0   oxybutynin (DITROPAN-XL) 5 MG 24 hr tablet, Take 5 mg by mouth 2 (two) times daily., Disp: , Rfl:    Vitamin  D, Ergocalciferol, (DRISDOL) 1.25 MG (50000 UNIT) CAPS capsule, Take 1 capsule (50,000 Units total) by mouth every 7 (seven) days. (Patient taking differently: Take 50,000 Units by mouth every 7 (seven) days. Monday's), Disp: 12 capsule, Rfl: 0   ezetimibe (ZETIA) 10 MG tablet, Take 1 tablet (10 mg total) by mouth daily. (Patient taking differently: Take 10 mg by mouth daily.), Disp: 90 tablet, Rfl: 3   losartan (COZAAR) 25 MG tablet, Take 1 tablet (25 mg total) by mouth daily. (Patient taking differently: Take 25 mg by mouth daily.), Disp: 90 tablet, Rfl: 3  EXAM:  VITALS per patient if applicable:  GENERAL: alert, oriented, appears well and in no acute distress  HEENT: atraumatic, conjunttiva clear, no obvious abnormalities on inspection of external nose and ears  NECK: normal movements of the head and neck  LUNGS: on inspection no signs of respiratory distress, breathing rate appears normal, no obvious gross SOB, gasping or wheezing  CV: no obvious cyanosis  MS: moves all visible extremities without noticeable abnormality  PSYCH/NEURO: pleasant and cooperative, no obvious depression or  anxiety, speech and thought processing grossly intact  ASSESSMENT AND PLAN:  Discussed the following assessment and plan:  Cough, unspecified type  Nasal congestion  -we discussed possible serious and likely etiologies, options for evaluation and workup, limitations of telemedicine visit vs in person visit, treatment, treatment risks and precautions. Pt is agreeable to treatment via telemedicine at this moment. Sounds like she had a viral resp infection, possibly influenza or covid with false negative testing vs other. Reports resolution of body aches and fevers with clear mucus so doubt bacterial infection. Opted for cough Rx and discussed prednisone vs ICS for the wheezing and opted for 2 week course of flovent and prn alb.  Advised to seek prompt follow up in person care if worsening, new symptoms arise, or if is not improving with treatment. Did let this patient know that I do telemedicine on Tuesdays and Thursdays for [place]. Advised to schedule follow up visit with PCP or [facility] if any further questions or concerns to avoid delays in care.   I discussed the assessment and treatment plan with the patient. The patient was provided an opportunity to ask questions and all were answered. The patient agreed with the plan and demonstrated an understanding of the instructions.     Sara Kern, DO

## 2021-07-18 ENCOUNTER — Encounter: Payer: Self-pay | Admitting: Internal Medicine

## 2021-07-18 ENCOUNTER — Other Ambulatory Visit: Payer: Self-pay

## 2021-07-18 ENCOUNTER — Ambulatory Visit (INDEPENDENT_AMBULATORY_CARE_PROVIDER_SITE_OTHER): Payer: 59 | Admitting: Internal Medicine

## 2021-07-18 VITALS — BP 128/74 | HR 72 | Resp 18 | Ht 63.0 in | Wt 253.2 lb

## 2021-07-18 DIAGNOSIS — R739 Hyperglycemia, unspecified: Secondary | ICD-10-CM

## 2021-07-18 DIAGNOSIS — I10 Essential (primary) hypertension: Secondary | ICD-10-CM | POA: Diagnosis not present

## 2021-07-18 DIAGNOSIS — R059 Cough, unspecified: Secondary | ICD-10-CM | POA: Diagnosis not present

## 2021-07-18 DIAGNOSIS — R062 Wheezing: Secondary | ICD-10-CM | POA: Insufficient documentation

## 2021-07-18 MED ORDER — AZITHROMYCIN 250 MG PO TABS: ORAL_TABLET | ORAL | 0 refills | Status: AC

## 2021-07-18 MED ORDER — PREDNISONE 10 MG PO TABS: ORAL_TABLET | ORAL | 0 refills | Status: DC

## 2021-07-18 NOTE — Assessment & Plan Note (Signed)
Lab Results  Component Value Date   HGBA1C 6.1 03/26/2021   Stable, pt to continue current medical treatment  - diet

## 2021-07-18 NOTE — Progress Notes (Signed)
Patient ID: [name], female   DOB: [birth year], 65 y.o.   MRN: [id number]        Chief Complaint: follow up cough and wheezing x 2 - 3  wks       HPI:  [name] is a 65 y.o. female Here with acute onset mild to mod 2-3 wks ST, HA, general weakness and malaise, with prod cough greenish sputum, but Pt denies chest pain, increased sob or doe, wheezing, orthopnea, PND, increased LE swelling, palpitations, dizziness or syncope, except for wheezing worsening in the past 2wks. Not better with UC tx nov 29.   Pt denies polydipsia, polyuria, or new focal neuro s/s.   Pt denies wt loss, night sweats, loss of appetite, or other constitutional symptoms      Wt Readings from Last 3 Encounters:  07/18/21 253 lb 3.2 oz (114.9 kg)  07/03/21 257 lb (116.6 kg)  05/24/21 259 lb 12.8 oz (117.8 kg)   BP Readings from Last 3 Encounters:  07/18/21 128/74  07/03/21 125/79  05/24/21 (!) 142/86         Past Medical History:  Diagnosis Date   Arthritis    neck, knees, ankles, feet, back   Asthma, mild intermittent    prn inhaler   Cervical spondylosis    Chronic dyspnea    per pt gets winded w/ stairs, but recovers quickly   Diverticular disease of colon    followed by dr Collene Mares (GI)---  chronic w/ chronic diarrhea   Essential hypertension    Fatty infiltration of liver    followed by dr Collene Mares--- abd ultrasound in epic 08-31-2020   Frequency of urination    History of chest pain    evaluated by cardiologist-- dr [name],  11/ 2017 nuclear stress test suggest ischemia,  06/2016 cardiac cath , showed normal coronary anatomy and lvf   History of COVID-19    per pt in 2019   treated with presumed covid had  mild to moderate symptoms that resolved   History of diverticulitis of colon    05/ 2022   History of TIA (transient ischemic attack) 06/2003   Hyperlipidemia    Hypertension    Limitation of joint motion of neck    per pt due to bone spurs C2-5   Mild intermittent asthma    followed by pcp    PMB (postmenopausal bleeding)    Pre-diabetes    Pulmonary nodule    followed by pcp,  last hest CT in epic 04-21-2018   Stenosis of cervix    SUI (stress urinary incontinence, female)    Thickened endometrium    TMJ syndrome    Wears glasses    Past Surgical History:  Procedure Laterality Date   APPENDECTOMY  1979   BREAST ENHANCEMENT SURGERY Bilateral 1988   per pt implants removed 2002   BUNIONECTOMY Left 07/17/2007   @[facility]    BUNIONECTOMY Right 05/26/2014   Procedure: RIGHT FOOT LAPIDUS BUNION CORRECTION AMD MODIFIED MCBRIDE BUNIONECTOMY;  Surgeon: Wylene Simmer, MD;  Location: [place];  Service: Orthopedics;  Laterality: Right;   CARDIAC CATHETERIZATION N/A 06/28/2016   Procedure: Left Heart Cath and Coronary Angiography;  Surgeon: Peter M Martinique, MD;  Location: [facility];  Service: Cardiovascular;  Laterality: N/A;   CESAREAN SECTION  1986   COLONOSCOPY  12/27/2020   by dr Collene Mares   FINGER SURGERY Left 07/28/2015   @[facility]  :  left thumb "knuckle collasped"  arthroplasty   FOOT HARDWARE REMOVAL  04/25/2016   @[facility] ;   right foot   FOOT SURGERY Right 04/04/2015   @[facility] ;   first and second toe's   GANGLION CYST EXCISION Right 2016   wrist   HYSTEROSCOPY WITH D & C N/A 05/24/2021   Procedure: DILATATION AND CURETTAGE /HYSTEROSCOPY and MyoSure;  Surgeon: Armandina Stammer, DO;  Location: [place];  Service: Gynecology;  Laterality: N/A;   NASAL SEPTUM SURGERY  1981   ROTATOR CUFF REPAIR W/ DISTAL CLAVICLE EXCISION Left 08/18/2008   @[facility]  by dr Shellia Carwin   SHOULDER ARTHROSCOPY W/ LABRAL REPAIR Left 06/07/2008   @[facility] ;  and SAD  by dr Shellia Carwin   TONSILLECTOMY  1964   TUBAL LIGATION Bilateral 1999    reports that she quit smoking about 15 years ago. Her smoking use included cigarettes. She has never used smokeless tobacco. She reports that she does not currently use alcohol. She reports that she does not use drugs. family history  includes Atrial fibrillation in her father; Cancer in her brother and mother; Colon polyps in her mother; Diabetes in her mother; Heart disease in her paternal aunt; Thalassemia in her maternal aunt, mother, and sister. Allergies  Allergen Reactions   Doxycycline Diarrhea and Nausea Only    Nausea and diarrhea started a few hours after taking medication.  Diarrhea and nausea continue 2 days after discontinuation.   Hydrocodone Bit-Homatrop Mbr Itching, Anxiety and Other (See Comments)    Made pt confused    Penicillins Shortness Of Breath and Swelling    TONGUE SWELLING Has patient had a PCN reaction causing immediate rash, facial/tongue/throat swelling, SOB or lightheadedness with hypotension: Unknown Has patient had a PCN reaction causing severe rash involving mucus membranes or skin necrosis: Unknown Has patient had a PCN reaction that required hospitalization: Was already in the hospital when happened Has patient had a PCN reaction occurring within the last 10 years: Yes able to take Augmentin If all of the above answers are "NO", then may proc   Promethazine Hcl Shortness Of Breath   Clarithromycin Nausea And Vomiting   Statins Other (See Comments)    LEG CRAMPS   Kiwi Extract Swelling   Current Outpatient Medications on File Prior to Visit  Medication Sig Dispense Refill   albuterol (VENTOLIN HFA) 108 (90 Base) MCG/ACT inhaler Inhale 2 puffs into the lungs every 4 (four) hours as needed for wheezing or shortness of breath. (Patient taking differently: Inhale 2 puffs into the lungs every 4 (four) hours as needed for wheezing or shortness of breath.) 1 each 1   benzonatate (TESSALON PERLES) 100 MG capsule 1-2 capsules up to twice daily for cough 20 capsule 0   cetirizine (ZYRTEC) 10 MG tablet Take 1 tablet (10 mg total) by mouth at bedtime. 90 tablet 3   Evolocumab (REPATHA SURECLICK) 244 MG/ML SOAJ Inject 140 mg into the skin every 14 (fourteen) days. 2 pen 11   fluticasone (FLONASE)  50 MCG/ACT nasal spray Place 2 sprays into both nostrils 2 (two) times daily as needed.  5   fluticasone (FLOVENT HFA) 44 MCG/ACT inhaler Inhale 2 puffs into the lungs every 12 (twelve) hours as needed. 1 each 0   ibuprofen (ADVIL) 800 MG tablet Take 1 tablet (800 mg total) by mouth every 8 (eight) hours as needed. 30 tablet 0   montelukast (SINGULAIR) 10 MG tablet TAKE 1 TABLET(10 MG) BY MOUTH AT BEDTIME (Patient taking differently: Take 10 mg by mouth at bedtime.) 90 tablet 0   oxybutynin (DITROPAN-XL)  5 MG 24 hr tablet Take 5 mg by mouth 2 (two) times daily.     Vitamin D, Ergocalciferol, (DRISDOL) 1.25 MG (50000 UNIT) CAPS capsule Take 1 capsule (50,000 Units total) by mouth every 7 (seven) days. (Patient taking differently: Take 50,000 Units by mouth every 7 (seven) days. Monday's) 12 capsule 0   ezetimibe (ZETIA) 10 MG tablet Take 1 tablet (10 mg total) by mouth daily. (Patient taking differently: Take 10 mg by mouth daily.) 90 tablet 3   losartan (COZAAR) 25 MG tablet Take 1 tablet (25 mg total) by mouth daily. (Patient taking differently: Take 25 mg by mouth daily.) 90 tablet 3   No current facility-administered medications on file prior to visit.        ROS:  All others reviewed and negative.  Objective        PE:  BP 128/74    Pulse 72    Resp 18    Ht 5\' 3"  (1.6 m)    Wt 253 lb 3.2 oz (114.9 kg)    SpO2 98%    BMI 44.85 kg/m                 Constitutional: Pt appears in NAD               HENT: Head: NCAT.                Right Ear: External ear normal.                 Left Ear: External ear normal.  Bilat tm's with mild erythema.  Max sinus areas non tender.  Pharynx with mild erythema, no exudate               Eyes: . Pupils are equal, round, and reactive to light. Conjunctivae and EOM are normal               Nose: without d/c or deformity               Neck: Neck supple. Gross normal ROM               Cardiovascular: Normal rate and regular rhythm.                  Pulmonary/Chest: Effort normal and breath sounds without rales but with mild bilat wheezing.                Abd:  Soft, NT, ND, + BS, no organomegaly               Neurological: Pt is alert. At baseline orientation, motor grossly intact               Skin: Skin is warm. No rashes, no other new lesions, LE edema - none               Psychiatric: Pt behavior is normal without agitation   Micro: none  Cardiac tracings I have personally interpreted today:  none  Pertinent Radiological findings (summarize): none   Lab Results  Component Value Date   WBC 9.4 07/03/2021   HGB 14.7 07/03/2021   HCT 43.8 07/03/2021   PLT 233 07/03/2021   GLUCOSE 104 (H) 07/03/2021   CHOL 127 03/26/2021   TRIG 139.0 03/26/2021   HDL 46.70 03/26/2021   LDLDIRECT 78.0 03/01/2019   LDLCALC 53 03/26/2021   ALT 24 07/03/2021   AST 21 07/03/2021   NA 134 (L) 07/03/2021   K  4.0 07/03/2021   CL 100 07/03/2021   CREATININE 1.14 (H) 07/03/2021   BUN 19 07/03/2021   CO2 25 07/03/2021   TSH 2.52 03/26/2021   INR 1.0 07/03/2021   HGBA1C 6.1 03/26/2021   MICROALBUR 0.9 11/26/2007   Assessment/Plan:  Shelitha Magley is a 65 y.o. White or Caucasian [1] female with  has a past medical history of Arthritis, Asthma, mild intermittent, Cervical spondylosis, Chronic dyspnea, Diverticular disease of colon, Essential hypertension, Fatty infiltration of liver, Frequency of urination, History of chest pain, History of COVID-19, History of diverticulitis of colon, History of TIA (transient ischemic attack) (06/2003), Hyperlipidemia, Hypertension, Limitation of joint motion of neck, Mild intermittent asthma, PMB (postmenopausal bleeding), Pre-diabetes, Pulmonary nodule, Stenosis of cervix, SUI (stress urinary incontinence, female), Thickened endometrium, TMJ syndrome, and Wears glasses.  Cough Mild to mod, c/w bronchitis vs pna, for antibx course, has tessaon perle at home,  to f/u any worsening symptoms or  concerns  Wheezing Mild to mod, for predpac asd, inhaler prn, to f/u any worsening symptoms or concerns  Essential hypertension BP Readings from Last 3 Encounters:  07/18/21 128/74  07/03/21 125/79  05/24/21 (!) 142/86   Stable, pt to continue medical treatment losartan   Hyperglycemia Lab Results  Component Value Date   HGBA1C 6.1 03/26/2021   Stable, pt to continue current medical treatment  - diet  Followup: Return if symptoms worsen or fail to improve.  Cathlean Cower, MD 07/18/2021 7:47 PM [facility]

## 2021-07-18 NOTE — Patient Instructions (Addendum)
Please take all new medication as prescribed - the antibiotic and prednisone  Please continue all other medications as before, and refills have been done if requested.  Please have the pharmacy call with any other refills you may need.  Please keep your appointments with your specialists as you may have planned

## 2021-07-18 NOTE — Assessment & Plan Note (Signed)
Mild to mod, for predpac asd, inhaler prn, to f/u any worsening symptoms or concerns 

## 2021-07-18 NOTE — Assessment & Plan Note (Signed)
BP Readings from Last 3 Encounters:  07/18/21 128/74  07/03/21 125/79  05/24/21 (!) 142/86   Stable, pt to continue medical treatment losartan

## 2021-07-18 NOTE — Assessment & Plan Note (Signed)
Mild to mod, c/w bronchitis vs pna, for antibx course, has tessaon perle at home,  to f/u any worsening symptoms or concerns

## 2021-08-06 ENCOUNTER — Other Ambulatory Visit: Payer: Self-pay | Admitting: Student

## 2021-08-08 NOTE — Telephone Encounter (Signed)
This is Dr. Crenshaw's pt 

## 2021-08-17 ENCOUNTER — Other Ambulatory Visit: Payer: Self-pay | Admitting: Family Medicine

## 2021-08-20 ENCOUNTER — Telehealth: Payer: Self-pay | Admitting: Cardiology

## 2021-08-20 DIAGNOSIS — R0602 Shortness of breath: Secondary | ICD-10-CM

## 2021-08-20 NOTE — Telephone Encounter (Signed)
Pt c/o Shortness Of Breath: STAT if SOB developed within the last 24 hours or pt is noticeably SOB on the phone  1. Are you currently SOB (can you hear that pt is SOB on the phone)? yes  2. How long have you been experiencing SOB? Not sure   3. Are you SOB when sitting or when up moving around? Moving around   4. Are you currently experiencing any other symptoms? no

## 2021-08-20 NOTE — Telephone Encounter (Signed)
Patient called triage because she "wants an appointment". She reports sob and leg cramps. She stated, "I'm not going to the hospital. I don't want any more bills." At this time her bp is 106/84, p 86.  While I had her on hold to schedule her in, she took out the trash and become sob with a bp of 153/95, p 84. She wants to know if she can have an order for an echo before her 2/28 appointment.

## 2021-08-21 NOTE — Telephone Encounter (Signed)
Spoke with pt, echo scheduled. 

## 2021-08-24 ENCOUNTER — Other Ambulatory Visit: Payer: Self-pay | Admitting: Internal Medicine

## 2021-08-29 ENCOUNTER — Encounter: Payer: Self-pay | Admitting: Cardiology

## 2021-08-29 ENCOUNTER — Ambulatory Visit (HOSPITAL_COMMUNITY): Payer: 59 | Attending: Cardiology

## 2021-08-29 ENCOUNTER — Other Ambulatory Visit: Payer: Self-pay

## 2021-08-29 DIAGNOSIS — R0602 Shortness of breath: Secondary | ICD-10-CM

## 2021-08-29 LAB — ECHOCARDIOGRAM COMPLETE
Area-P 1/2: 5.13 cm2
S' Lateral: 2.4 cm

## 2021-09-11 ENCOUNTER — Encounter: Payer: Self-pay | Admitting: Internal Medicine

## 2021-09-11 NOTE — Telephone Encounter (Signed)
Pt informing provider she made an appt for 09-14-2021 to discuss being prescribed wegovy, pt states if provider will not prescribed medication, she will cx her appt.  Please advise

## 2021-09-12 MED ORDER — WEGOVY 0.25 MG/0.5ML ~~LOC~~ SOAJ: 0.2500 mg | SUBCUTANEOUS | 0 refills | Status: DC

## 2021-09-12 MED ORDER — WEGOVY 1 MG/0.5ML ~~LOC~~ SOAJ: 1.0000 mg | SUBCUTANEOUS | 1 refills | Status: DC

## 2021-09-12 MED ORDER — WEGOVY 0.5 MG/0.5ML ~~LOC~~ SOAJ: 0.5000 mg | SUBCUTANEOUS | 0 refills | Status: DC

## 2021-09-12 NOTE — Telephone Encounter (Signed)
See below

## 2021-09-14 ENCOUNTER — Ambulatory Visit: Payer: 59 | Admitting: Internal Medicine

## 2021-09-26 NOTE — Progress Notes (Signed)
HPI: [name], hyperlipidemia. Nuclear study 9/17 showed ejection fraction 63%. There was a small defect suggestive of ischemia in the apical lateral wall. Cardiac catheterization 11/17 showed normal LV function and normal coronary anatomy. Pt intolerant to statins and referred to lipid clinic; started on repatha.  Patient is seen with recurrent falls and question syncope December 2021. Monitor 1/22 showed sinus bradycardia, normal sinus rhythm and sinus tachycardia.  Echocardiogram January 2023 showed normal LV function, grade 1 diastolic dysfunction.  Since last seen, she has dyspnea on exertion but no orthopnea, PND, pedal edema, chest pain or syncope.  She also has decreased energy since undergoing surgery in November.  Current Outpatient Medications  Medication Sig Dispense Refill   albuterol (VENTOLIN HFA) 108 (90 Base) MCG/ACT inhaler Inhale 2 puffs into the lungs every 4 (four) hours as needed for wheezing or shortness of breath. (Patient taking differently: Inhale 2 puffs into the lungs every 4 (four) hours as needed for wheezing or shortness of breath.) 1 each 1   cetirizine (ZYRTEC) 10 MG tablet Take 1 tablet (10 mg total) by mouth at bedtime. 90 tablet 3   citalopram (CELEXA) 10 MG tablet Take 10 mg by mouth daily.     Evolocumab (REPATHA SURECLICK) 280 MG/ML SOAJ Inject 140 mg into the skin every 14 (fourteen) days. 2 pen 11   FLOVENT HFA 44 MCG/ACT inhaler INHALE 2 PUFFS INTO THE LUNGS EVERY 12 HOURS AS NEEDED 10.6 g 0   fluticasone (FLONASE) 50 MCG/ACT nasal spray Place 2 sprays into both nostrils 2 (two) times daily as needed.  5   ibuprofen (ADVIL) 800 MG tablet Take 1 tablet (800 mg total) by mouth every 8 (eight) hours as needed. 30 tablet 0   losartan (COZAAR) 25 MG tablet TAKE 1 TABLET(25 MG) BY MOUTH DAILY 90 tablet 3   montelukast (SINGULAIR) 10 MG tablet TAKE 1 TABLET(10 MG) BY MOUTH AT BEDTIME 90 tablet 1   Semaglutide-Weight Management (WEGOVY) 0.25 MG/0.5ML  SOAJ Inject 0.25 mg into the skin once a week. 2 mL 0   Semaglutide-Weight Management (WEGOVY) 0.5 MG/0.5ML SOAJ Inject 0.5 mg into the skin once a week. 2 mL 0   Semaglutide-Weight Management (WEGOVY) 1 MG/0.5ML SOAJ Inject 1 mg into the skin once a week. 2 mL 1   Vitamin D, Ergocalciferol, (DRISDOL) 1.25 MG (50000 UNIT) CAPS capsule Take 1 capsule (50,000 Units total) by mouth every 7 (seven) days. (Patient taking differently: Take 50,000 Units by mouth every 7 (seven) days. Monday's) 12 capsule 0   ezetimibe (ZETIA) 10 MG tablet Take 1 tablet (10 mg total) by mouth daily. (Patient taking differently: Take 10 mg by mouth daily.) 90 tablet 3   No current facility-administered medications for this visit.     Past Medical History:  Diagnosis Date   Arthritis    neck, knees, ankles, feet, back   Asthma, mild intermittent    prn inhaler   Cervical spondylosis    Chronic dyspnea    per pt gets winded w/ stairs, but recovers quickly   Diverticular disease of colon    followed by dr Collene Mares (GI)---  chronic w/ chronic diarrhea   Essential hypertension    Fatty infiltration of liver    followed by dr Collene Mares--- abd ultrasound in epic 08-31-2020   Frequency of urination    History of chest pain    evaluated by cardiologist-- dr [name],  11/ 2017 nuclear stress test suggest ischemia,  06/2016 cardiac cath ,  showed normal coronary anatomy and lvf   History of COVID-19    per pt in 2019   treated with presumed covid had  mild to moderate symptoms that resolved   History of diverticulitis of colon    05/ 2022   History of TIA (transient ischemic attack) 06/2003   Hyperlipidemia    Hypertension    Limitation of joint motion of neck    per pt due to bone spurs C2-5   Mild intermittent asthma    followed by pcp   PMB (postmenopausal bleeding)    Pre-diabetes    Pulmonary nodule    followed by pcp,  last hest CT in epic 04-21-2018   Stenosis of cervix    SUI (stress urinary incontinence,  female)    Thickened endometrium    TMJ syndrome    Wears glasses     Past Surgical History:  Procedure Laterality Date   APPENDECTOMY  1979   BREAST ENHANCEMENT SURGERY Bilateral 1988   per pt implants removed 2002   BUNIONECTOMY Left 07/17/2007   @[facility]    BUNIONECTOMY Right 05/26/2014   Procedure: RIGHT FOOT LAPIDUS BUNION CORRECTION AMD MODIFIED MCBRIDE BUNIONECTOMY;  Surgeon: Wylene Simmer, MD;  Location: [facility];  Service: Orthopedics;  Laterality: Right;   CARDIAC CATHETERIZATION N/A 06/28/2016   Procedure: Left Heart Cath and Coronary Angiography;  Surgeon: Peter M Martinique, MD;  Location: [facility];  Service: Cardiovascular;  Laterality: N/A;   CESAREAN SECTION  1986   COLONOSCOPY  12/27/2020   by dr Collene Mares   FINGER SURGERY Left 07/28/2015   @[facility]  :  left thumb "knuckle collasped"  arthroplasty   FOOT HARDWARE REMOVAL  04/25/2016   @[facility] ;   right foot   FOOT SURGERY Right 04/04/2015   @[facility] ;   first and second toe's   GANGLION CYST EXCISION Right 2016   wrist   HYSTEROSCOPY WITH D & C N/A 05/24/2021   Procedure: DILATATION AND CURETTAGE /HYSTEROSCOPY and MyoSure;  Surgeon: Armandina Stammer, DO;  Location: [facility];  Service: Gynecology;  Laterality: N/A;   NASAL SEPTUM SURGERY  1981   ROTATOR CUFF REPAIR W/ DISTAL CLAVICLE EXCISION Left 08/18/2008   @[facility]  by dr Shellia Carwin   SHOULDER ARTHROSCOPY W/ LABRAL REPAIR Left 06/07/2008   @[facility] ;  and SAD  by dr Shellia Carwin   TONSILLECTOMY  1964   TUBAL LIGATION Bilateral 1999    Social History   Socioeconomic History   Marital status: Married    Spouse name: Not on file   Number of children: 2   Years of education: Not on file   Highest education level: Not on file  Occupational History   Not on file  Tobacco Use   Smoking status: Former    Years: 30.00    Types: Cigarettes    Quit date: 09/12/2005    Years since quitting: 16.0   Smokeless tobacco: Never  Vaping Use   Vaping Use:  Never used  Substance and Sexual Activity   Alcohol use: Not Currently    Comment: rare   Drug use: No   Sexual activity: Not on file  Other Topics Concern   Not on file  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   Financial Resource Strain: Not on file  Food Insecurity: Not on file  Transportation Needs: Not on file  Physical Activity: Not on file  Stress: Not on file  Social Connections: Not on file  Intimate Partner Violence: Not on file    Family History  Problem Relation Age of Onset   Cancer Mother        lung   Diabetes Mother    Thalassemia Mother    Colon polyps Mother    Cancer Brother        lymphatic   Heart disease Paternal Aunt    Atrial fibrillation Father    Thalassemia Sister    Thalassemia Maternal Aunt    Colon cancer Neg Hx    Rectal cancer Neg Hx    Stomach cancer Neg Hx     ROS: no fevers or chills, productive cough, hemoptysis, dysphasia, odynophagia, melena, hematochezia, dysuria, hematuria, rash, seizure activity, orthopnea, PND, pedal edema, claudication. Remaining systems are negative.  Physical Exam: Well-developed well-nourished in no acute distress.  Skin is warm and dry.  HEENT is normal.  Neck is supple.  Chest is clear to auscultation with normal expansion.  Cardiovascular exam is regular rate and rhythm.  Abdominal exam nontender or distended. No masses palpated. Extremities show no edema. neuro grossly intact   A/P  1 hypertension-patient's blood pressure is elevated; increase losartan to 50 mg daily and add HCTZ 12.5 mg daily; check potassium and renal function in 1 week.  Titrate medications as needed for blood pressure control.  2 hyperlipidemia-continue Repatha.  Check lipids and liver.  3 history of chest pain-patient denies any recurrent episodes.  Previous catheterization revealed no coronary disease.  4 dyspnea-felt secondary to deconditioning/obesity hypoventilation syndrome.  Patient  does snore.  We will arrange a sleep study to rule out sleep apnea.  Note previous echocardiogram showed normal LV function.  5 obesity-we discussed the need for diet, exercise and weight loss.  Kirk Ruths, MD

## 2021-10-02 ENCOUNTER — Other Ambulatory Visit: Payer: Self-pay

## 2021-10-02 ENCOUNTER — Ambulatory Visit (INDEPENDENT_AMBULATORY_CARE_PROVIDER_SITE_OTHER): Payer: 59 | Admitting: Cardiology

## 2021-10-02 ENCOUNTER — Encounter: Payer: Self-pay | Admitting: Cardiology

## 2021-10-02 VITALS — BP 152/99 | HR 80 | Ht 60.0 in | Wt 254.6 lb

## 2021-10-02 DIAGNOSIS — I10 Essential (primary) hypertension: Secondary | ICD-10-CM | POA: Diagnosis not present

## 2021-10-02 DIAGNOSIS — R030 Elevated blood-pressure reading, without diagnosis of hypertension: Secondary | ICD-10-CM

## 2021-10-02 DIAGNOSIS — R0683 Snoring: Secondary | ICD-10-CM | POA: Diagnosis not present

## 2021-10-02 DIAGNOSIS — E785 Hyperlipidemia, unspecified: Secondary | ICD-10-CM

## 2021-10-02 MED ORDER — LOSARTAN POTASSIUM-HCTZ 50-12.5 MG PO TABS: 1.0000 | ORAL_TABLET | Freq: Every day | ORAL | 3 refills | Status: DC

## 2021-10-02 NOTE — Patient Instructions (Signed)
Medication Instructions:   STOP LOSARTAN  START LOSARTAN-HCTZ 50/12.5 MG ONCE DAILY  *If you need a refill on your cardiac medications before your next appointment, please call your pharmacy*   Lab Work:  Your physician recommends that you return for lab work in: [facility]  If you have labs (blood work) drawn today and your tests are completely normal, you will receive your results only by: [place] (if you have MyChart) OR A paper copy in the mail If you have any lab test that is abnormal or we need to change your treatment, we will call you to review the results.   Follow-Up: At [facility], you and your health needs are our priority.  As part of our continuing mission to provide you with exceptional heart care, we have created designated Provider Care Teams.  These Care Teams include your primary Cardiologist (physician) and Advanced Practice Providers (APPs -  Physician Assistants and Nurse Practitioners) who all work together to provide you with the care you need, when you need it.  We recommend signing up for the patient portal called "MyChart".  Sign up information is provided on this After Visit Summary.  MyChart is used to connect with patients for Virtual Visits (Telemedicine).  Patients are able to view lab/test results, encounter notes, upcoming appointments, etc.  Non-urgent messages can be sent to your provider as well.   To learn more about what you can do with MyChart, go to [email or web address].    Your next appointment:   6 month(s)  The format for your next appointment:   In Person  Provider:   Kirk Ruths, MD

## 2021-10-05 LAB — HM MAMMOGRAPHY

## 2021-10-23 ENCOUNTER — Encounter: Payer: Self-pay | Admitting: Internal Medicine

## 2021-10-23 ENCOUNTER — Other Ambulatory Visit: Payer: Self-pay

## 2021-10-23 ENCOUNTER — Ambulatory Visit: Payer: 59 | Admitting: Internal Medicine

## 2021-10-23 DIAGNOSIS — H66003 Acute suppurative otitis media without spontaneous rupture of ear drum, bilateral: Secondary | ICD-10-CM

## 2021-10-23 DIAGNOSIS — N951 Menopausal and female climacteric states: Secondary | ICD-10-CM | POA: Diagnosis not present

## 2021-10-23 MED ORDER — CITALOPRAM HYDROBROMIDE 10 MG PO TABS: 10.0000 mg | ORAL_TABLET | Freq: Every day | ORAL | 3 refills | Status: DC

## 2021-10-23 MED ORDER — CIPROFLOXACIN-DEXAMETHASONE 0.3-0.1 % OT SUSP: 4.0000 [drp] | Freq: Two times a day (BID) | OTIC | 0 refills | Status: DC

## 2021-10-23 NOTE — Progress Notes (Signed)
? ?  Subjective:  ? ?Patient ID: [name], female    DOB: 03/14/1956, 66 y.o.   MRN: [id number] ? ?HPI ?The patient is a 66 YO female coming in for possible ear infection. ? ?Review of Systems  ?Constitutional: Negative.   ?HENT:  Positive for ear pain.   ?Eyes: Negative.   ?Respiratory:  Negative for cough, chest tightness and shortness of breath.   ?Cardiovascular:  Negative for chest pain, palpitations and leg swelling.  ?Gastrointestinal:  Negative for abdominal distention, abdominal pain, constipation, diarrhea, nausea and vomiting.  ?Musculoskeletal: Negative.   ?Skin: Negative.   ?Neurological: Negative.   ?Psychiatric/Behavioral: Negative.    ? ?Objective:  ?Physical Exam ?Constitutional:   ?   Appearance: She is well-developed.  ?HENT:  ?   Head: Normocephalic and atraumatic.  ?   Right Ear: Ear canal normal.  ?   Left Ear: Ear canal normal.  ?   Ears:  ?   Comments: Left TM bulging cloudy fluid, Right TM bulging clear fluid ?Cardiovascular:  ?   Rate and Rhythm: Normal rate and regular rhythm.  ?Pulmonary:  ?   Effort: Pulmonary effort is normal. No respiratory distress.  ?   Breath sounds: Normal breath sounds. No wheezing or rales.  ?Abdominal:  ?   General: Bowel sounds are normal. There is no distension.  ?   Palpations: Abdomen is soft.  ?   Tenderness: There is no abdominal tenderness. There is no rebound.  ?Musculoskeletal:  ?   Cervical back: Normal range of motion.  ?Skin: ?   General: Skin is warm and dry.  ?Neurological:  ?   Mental Status: She is alert and oriented to person, place, and time.  ?   Coordination: Coordination normal.  ? ? ?Vitals:  ? 10/23/21 1037  ?BP: 126/70  ?Pulse: 86  ?Resp: 18  ?SpO2: 97%  ?Weight: 261 lb 12.8 oz (118.8 kg)  ?Height: 5' (1.524 m)  ? ? ?This visit occurred during the SARS-CoV-2 public health emergency.  Safety protocols were in place, including screening questions prior to the visit, additional usage of staff PPE, and extensive cleaning of exam room  while observing appropriate contact time as indicated for disinfecting solutions.  ? ?Assessment & Plan:  ? ?

## 2021-10-23 NOTE — Patient Instructions (Addendum)
We have sent in ear drops to use 4 drops in each ear twice a day for 5 days. ? ?We have refilled the citalopram.  ? ? ?

## 2021-10-23 NOTE — Assessment & Plan Note (Signed)
Started on citalopram 10 mg daily by [facility] after surgery. This has helped some. Advised treatment course 6-12 months and then try stopping. Refilled today for #90 with refills for citalopram 10 mg daily.  ?

## 2021-10-23 NOTE — Assessment & Plan Note (Signed)
Rx ciprodex ear drops to use bilaterally for 5 days. Call if no improvement.  ?

## 2021-10-29 ENCOUNTER — Encounter: Payer: Self-pay | Admitting: Internal Medicine

## 2021-10-31 ENCOUNTER — Institutional Professional Consult (permissible substitution): Payer: 59 | Admitting: Pulmonary Disease

## 2021-11-01 ENCOUNTER — Other Ambulatory Visit: Payer: Self-pay | Admitting: Cardiology

## 2021-11-07 ENCOUNTER — Encounter: Payer: Self-pay | Admitting: *Deleted

## 2021-11-24 LAB — COMPREHENSIVE METABOLIC PANEL
ALT: 23 IU/L (ref 0–32)
AST: 20 IU/L (ref 0–40)
Albumin/Globulin Ratio: 1.9 (ref 1.2–2.2)
Albumin: 4.5 g/dL (ref 3.8–4.8)
Alkaline Phosphatase: 103 IU/L (ref 44–121)
BUN/Creatinine Ratio: 19 (ref 12–28)
BUN: 18 mg/dL (ref 8–27)
Bilirubin Total: 0.4 mg/dL (ref 0.0–1.2)
CO2: 23 mmol/L (ref 20–29)
Calcium: 9.9 mg/dL (ref 8.7–10.3)
Chloride: 100 mmol/L (ref 96–106)
Creatinine, Ser: 0.95 mg/dL (ref 0.57–1.00)
Globulin, Total: 2.4 g/dL (ref 1.5–4.5)
Glucose: 106 mg/dL — ABNORMAL HIGH (ref 70–99)
Potassium: 4.2 mmol/L (ref 3.5–5.2)
Sodium: 142 mmol/L (ref 134–144)
Total Protein: 6.9 g/dL (ref 6.0–8.5)
eGFR: 67 mL/min/{1.73_m2} (ref >59)

## 2021-11-24 LAB — LIPID PANEL
Chol/HDL Ratio: 3 ratio (ref 0.0–4.4)
Cholesterol, Total: 135 mg/dL (ref 100–199)
HDL: 45 mg/dL (ref >39)
LDL Chol Calc (NIH): 61 mg/dL (ref 0–99)
Triglycerides: 171 mg/dL — ABNORMAL HIGH (ref 0–149)
VLDL Cholesterol Cal: 29 mg/dL (ref 5–40)

## 2021-11-29 ENCOUNTER — Encounter: Payer: Self-pay | Admitting: Internal Medicine

## 2021-11-29 ENCOUNTER — Encounter: Payer: Self-pay | Admitting: *Deleted

## 2021-12-10 ENCOUNTER — Ambulatory Visit: Payer: 59 | Admitting: Internal Medicine

## 2021-12-10 ENCOUNTER — Encounter: Payer: Self-pay | Admitting: Internal Medicine

## 2021-12-10 VITALS — BP 126/70 | HR 86 | Resp 18 | Ht 60.0 in | Wt 249.8 lb

## 2021-12-10 DIAGNOSIS — R1030 Lower abdominal pain, unspecified: Secondary | ICD-10-CM | POA: Insufficient documentation

## 2021-12-10 LAB — CBC
HCT: 42.2 % (ref 36.0–46.0)
Hemoglobin: 14.3 g/dL (ref 12.0–15.0)
MCHC: 33.8 g/dL (ref 30.0–36.0)
MCV: 87.5 fl (ref 78.0–100.0)
Platelets: 210 10*3/uL (ref 150.0–400.0)
RBC: 4.83 Mil/uL (ref 3.87–5.11)
RDW: 13.5 % (ref 11.5–15.5)
WBC: 8.9 10*3/uL (ref 4.0–10.5)

## 2021-12-10 NOTE — Progress Notes (Signed)
? ?  Subjective:  ? ?Patient ID: [name], female    DOB: September 23, 1955, 66 y.o.   MRN: [id number] ? ?HPI ?The patient is a 66 YO female coming in for stomach problems. Prior colonoscopy 2022 with diverticulosis.  ? ?Review of Systems  ?Constitutional:  Positive for appetite change and unexpected weight change.  ?HENT: Negative.    ?Eyes: Negative.   ?Respiratory:  Negative for cough, chest tightness and shortness of breath.   ?Cardiovascular:  Negative for chest pain, palpitations and leg swelling.  ?Gastrointestinal:  Positive for abdominal distention and abdominal pain. Negative for constipation, diarrhea, nausea and vomiting.  ?Musculoskeletal: Negative.   ?Skin: Negative.   ?Neurological: Negative.   ?Psychiatric/Behavioral: Negative.    ? ?Objective:  ?Physical Exam ?Constitutional:   ?   Appearance: She is well-developed.  ?HENT:  ?   Head: Normocephalic and atraumatic.  ?Cardiovascular:  ?   Rate and Rhythm: Normal rate and regular rhythm.  ?Pulmonary:  ?   Effort: Pulmonary effort is normal. No respiratory distress.  ?   Breath sounds: Normal breath sounds. No wheezing or rales.  ?Abdominal:  ?   General: Bowel sounds are normal. There is no distension.  ?   Palpations: Abdomen is soft.  ?   Tenderness: There is abdominal tenderness. There is no rebound.  ?   Comments: Tenderness lower abdomen to palpation without rebound or guarding  ?Musculoskeletal:  ?   Cervical back: Normal range of motion.  ?Skin: ?   General: Skin is warm and dry.  ?Neurological:  ?   Mental Status: She is alert and oriented to person, place, and time.  ?   Coordination: Coordination normal.  ? ? ?Vitals:  ? 12/10/21 1527  ?BP: 126/70  ?Pulse: 86  ?Resp: 18  ?SpO2: 98%  ?Weight: 249 lb 12.8 oz (113.3 kg)  ?Height: 5' (1.524 m)  ? ? ?This visit occurred during the SARS-CoV-2 public health emergency.  Safety protocols were in place, including screening questions prior to the visit, additional usage of staff PPE, and extensive cleaning  of exam room while observing appropriate contact time as indicated for disinfecting solutions.  ? ?Assessment & Plan:  ? ?

## 2021-12-10 NOTE — Assessment & Plan Note (Signed)
Suspect diverticulitis given lower abdomen and known diverticulosis. Checking CBC, CMP, lipase to give supporting evidence or narrow diagnosis. She does have cipro and flagyl at home leftover and will start taking this. Adjust as needed. CT abdomen reviewed from fall 2022.  ?

## 2021-12-10 NOTE — Patient Instructions (Addendum)
We will check the labs today. ? ?Start the cipro and flagyl today. ?

## 2021-12-11 ENCOUNTER — Encounter: Payer: Self-pay | Admitting: Internal Medicine

## 2021-12-11 LAB — COMPREHENSIVE METABOLIC PANEL
ALT: 18 U/L (ref 0–35)
AST: 22 U/L (ref 0–37)
Albumin: 4.6 g/dL (ref 3.5–5.2)
Alkaline Phosphatase: 89 U/L (ref 39–117)
BUN: 15 mg/dL (ref 6–23)
CO2: 30 mEq/L (ref 19–32)
Calcium: 10.2 mg/dL (ref 8.4–10.5)
Chloride: 97 mEq/L (ref 96–112)
Creatinine, Ser: 1.05 mg/dL (ref 0.40–1.20)
GFR: 56.52 mL/min — ABNORMAL LOW (ref >60.00)
Glucose, Bld: 85 mg/dL (ref 70–99)
Potassium: 3.8 mEq/L (ref 3.5–5.1)
Sodium: 137 mEq/L (ref 135–145)
Total Bilirubin: 0.9 mg/dL (ref 0.2–1.2)
Total Protein: 8.1 g/dL (ref 6.0–8.3)

## 2021-12-11 LAB — LIPASE: Lipase: 14 U/L (ref 11.0–59.0)

## 2021-12-12 ENCOUNTER — Other Ambulatory Visit (HOSPITAL_COMMUNITY): Payer: Self-pay | Admitting: Gastroenterology

## 2021-12-12 ENCOUNTER — Other Ambulatory Visit: Payer: Self-pay | Admitting: Gastroenterology

## 2021-12-12 DIAGNOSIS — R1011 Right upper quadrant pain: Secondary | ICD-10-CM

## 2021-12-13 ENCOUNTER — Ambulatory Visit (HOSPITAL_COMMUNITY)
Admission: RE | Admit: 2021-12-13 | Discharge: 2021-12-13 | Disposition: A | Discharge status: Home / Self Care | Payer: 59 | Source: Ambulatory Visit | Attending: Gastroenterology | Admitting: Gastroenterology

## 2021-12-13 DIAGNOSIS — R1011 Right upper quadrant pain: Secondary | ICD-10-CM | POA: Diagnosis not present

## 2021-12-17 ENCOUNTER — Encounter (HOSPITAL_COMMUNITY): Payer: 59

## 2021-12-17 ENCOUNTER — Encounter (HOSPITAL_COMMUNITY): Payer: Self-pay

## 2022-02-03 ENCOUNTER — Other Ambulatory Visit: Payer: Self-pay | Admitting: Internal Medicine

## 2022-03-11 ENCOUNTER — Other Ambulatory Visit: Payer: Self-pay | Admitting: Internal Medicine

## 2022-03-14 NOTE — Progress Notes (Signed)
HPI: [name], hyperlipidemia. Nuclear study 9/17 showed ejection fraction 63%. There was a small defect suggestive of ischemia in the apical lateral wall. Cardiac catheterization 11/17 showed normal LV function and normal coronary anatomy. Pt intolerant to statins and referred to lipid clinic; started on repatha.  Patient seen with recurrent falls and question syncope December 2021. Monitor 1/22 showed sinus bradycardia, normal sinus rhythm and sinus tachycardia.  Echocardiogram January 2023 showed normal LV function, grade 1 diastolic dysfunction.  Since last seen, she has some dyspnea on exertion improved compared to previous.  No orthopnea, PND, pedal edema, chest pain or syncope.  She is working on her diet and has lost 30 pounds in the past 4 months.  Current Outpatient Medications  Medication Sig Dispense Refill   albuterol (VENTOLIN HFA) 108 (90 Base) MCG/ACT inhaler Inhale 2 puffs into the lungs every 4 (four) hours as needed for wheezing or shortness of breath. 1 each 1   cetirizine (ZYRTEC) 10 MG tablet Take 1 tablet (10 mg total) by mouth at bedtime. 90 tablet 3   citalopram (CELEXA) 10 MG tablet Take 1 tablet (10 mg total) by mouth daily. 90 tablet 3   ezetimibe (ZETIA) 10 MG tablet Take 1 tablet (10 mg total) by mouth daily. 90 tablet 3   FLOVENT HFA 44 MCG/ACT inhaler INHALE 2 PUFFS INTO THE LUNGS EVERY 12 HOURS AS NEEDED 10.6 g 0   fluticasone (FLONASE) 50 MCG/ACT nasal spray Place 2 sprays into both nostrils 2 (two) times daily as needed.  5   losartan-hydrochlorothiazide (HYZAAR) 50-12.5 MG tablet Take 1 tablet by mouth daily. 90 tablet 3   montelukast (SINGULAIR) 10 MG tablet TAKE 1 TABLET(10 MG) BY MOUTH AT BEDTIME 90 tablet 1   REPATHA SURECLICK 606 MG/ML SOAJ ADMINISTER 1 ML UNDER THE SKIN EVERY 14 DAYS 2 mL 0   No current facility-administered medications for this visit.     Past Medical History:  Diagnosis Date   Arthritis    neck, knees, ankles, feet,  back   Asthma, mild intermittent    prn inhaler   Cervical spondylosis    Chronic dyspnea    per pt gets winded w/ stairs, but recovers quickly   Diverticular disease of colon    followed by dr Collene Mares (GI)---  chronic w/ chronic diarrhea   Essential hypertension    Fatty infiltration of liver    followed by dr Collene Mares--- abd ultrasound in epic 08-31-2020   Frequency of urination    History of chest pain    evaluated by cardiologist-- dr [name],  11/ 2017 nuclear stress test suggest ischemia,  06/2016 cardiac cath , showed normal coronary anatomy and lvf   History of COVID-19    per pt in 2019   treated with presumed covid had  mild to moderate symptoms that resolved   History of diverticulitis of colon    05/ 2022   History of TIA (transient ischemic attack) 06/2003   Hyperlipidemia    Hypertension    Limitation of joint motion of neck    per pt due to bone spurs C2-5   Mild intermittent asthma    followed by pcp   PMB (postmenopausal bleeding)    Pre-diabetes    Pulmonary nodule    followed by pcp,  last hest CT in epic 04-21-2018   Stenosis of cervix    SUI (stress urinary incontinence, female)    Thickened endometrium    TMJ syndrome  Wears glasses     Past Surgical History:  Procedure Laterality Date   APPENDECTOMY  1979   BREAST ENHANCEMENT SURGERY Bilateral 1988   per pt implants removed 2002   BUNIONECTOMY Left 07/17/2007   '@[facility]'$    BUNIONECTOMY Right 05/26/2014   Procedure: RIGHT FOOT LAPIDUS BUNION CORRECTION AMD MODIFIED MCBRIDE BUNIONECTOMY;  Surgeon: Wylene Simmer, MD;  Location: [facility];  Service: Orthopedics;  Laterality: Right;   CARDIAC CATHETERIZATION N/A 06/28/2016   Procedure: Left Heart Cath and Coronary Angiography;  Surgeon: Peter M Martinique, MD;  Location: [facility];  Service: Cardiovascular;  Laterality: N/A;   CESAREAN SECTION  1986   COLONOSCOPY  12/27/2020   by dr Collene Mares   FINGER SURGERY Left 07/28/2015   '@[facility]  :  left  thumb "knuckle collasped"  arthroplasty   FOOT HARDWARE REMOVAL  04/25/2016   '@[facility]'$ ;   right foot   FOOT SURGERY Right 04/04/2015   '@[facility]'$ ;   first and second toe's   GANGLION CYST EXCISION Right 2016   wrist   HYSTEROSCOPY WITH D & C N/A 05/24/2021   Procedure: DILATATION AND CURETTAGE /HYSTEROSCOPY and MyoSure;  Surgeon: Armandina Stammer, DO;  Location: [facility];  Service: Gynecology;  Laterality: N/A;   NASAL SEPTUM SURGERY  1981   ROTATOR CUFF REPAIR W/ DISTAL CLAVICLE EXCISION Left 08/18/2008   '@[facility]'$  by dr Shellia Carwin   SHOULDER ARTHROSCOPY W/ LABRAL REPAIR Left 06/07/2008   '@[facility]'$ ;  and SAD  by dr Shellia Carwin   TONSILLECTOMY  1964   TUBAL LIGATION Bilateral 1999    Social History   Socioeconomic History   Marital status: Married    Spouse name: Not on file   Number of children: 2   Years of education: Not on file   Highest education level: Not on file  Occupational History   Not on file  Tobacco Use   Smoking status: Former    Years: 30.00    Types: Cigarettes    Quit date: 09/12/2005    Years since quitting: 16.5   Smokeless tobacco: Never  Vaping Use   Vaping Use: Never used  Substance and Sexual Activity   Alcohol use: Not Currently    Comment: rare   Drug use: No   Sexual activity: Not on file  Other Topics Concern   Not on file  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[employer]   Financial Resource Strain: Not on file  Food Insecurity: Not on file  Transportation Needs: Not on file  Physical Activity: Not on file  Stress: Not on file  Social Connections: Not on file  Intimate Partner Violence: Not on file    Family History  Problem Relation Age of Onset   Cancer Mother        lung   Diabetes Mother    Thalassemia Mother    Colon polyps Mother    Cancer Brother        lymphatic   Heart disease Paternal Aunt    Atrial fibrillation Father    Thalassemia Sister    Thalassemia Maternal Aunt    Colon cancer Neg Hx     Rectal cancer Neg Hx    Stomach cancer Neg Hx     ROS: no fevers or chills, productive cough, hemoptysis, dysphasia, odynophagia, melena, hematochezia, dysuria, hematuria, rash, seizure activity, orthopnea, PND, pedal edema, claudication. Remaining systems are negative.  Physical Exam: Well-developed well-nourished in no acute distress.  Skin  is warm and dry.  HEENT is normal.  Neck is supple.  Chest is clear to auscultation with normal expansion.  Cardiovascular exam is regular rate and rhythm.  Abdominal exam nontender or distended. No masses palpated. Extremities show no edema. neuro grossly intact  A/P  1 hypertension-patient's blood pressure is controlled.  Continue present medications and follow.  Check potassium and renal function.  She is complaining of some calf discomfort/cramping and wonders whether it may be from HCTZ.  We can consider discontinuing in the future if symptoms persist.  2 hyperlipidemia-continue Repatha.  3 history of dyspnea-this is felt secondary to a combination of obesity, deconditioning and obesity hypoventilation syndrome.  Her symptoms have somewhat improved with recent weight loss of 30 pounds.  She will continue efforts with this.  Patient does snore.  We discussed referral for sleep study.  However she would prefer not to pursue.  4 obesity-she has lost 30 pounds recently.  She will continue efforts at weight loss as she feels much better.  Kirk Ruths, MD

## 2022-03-27 ENCOUNTER — Ambulatory Visit: Payer: 59 | Admitting: Cardiology

## 2022-03-27 ENCOUNTER — Encounter: Payer: Self-pay | Admitting: Cardiology

## 2022-03-27 VITALS — BP 108/64 | HR 94 | Ht 60.0 in | Wt 232.0 lb

## 2022-03-27 DIAGNOSIS — E785 Hyperlipidemia, unspecified: Secondary | ICD-10-CM

## 2022-03-27 DIAGNOSIS — R0602 Shortness of breath: Secondary | ICD-10-CM

## 2022-03-27 DIAGNOSIS — I10 Essential (primary) hypertension: Secondary | ICD-10-CM

## 2022-03-27 NOTE — Patient Instructions (Signed)

## 2022-03-28 LAB — BASIC METABOLIC PANEL
BUN/Creatinine Ratio: 18 (ref 12–28)
BUN: 20 mg/dL (ref 8–27)
CO2: 24 mmol/L (ref 20–29)
Calcium: 10.1 mg/dL (ref 8.7–10.3)
Chloride: 99 mmol/L (ref 96–106)
Creatinine, Ser: 1.09 mg/dL — ABNORMAL HIGH (ref 0.57–1.00)
Glucose: 99 mg/dL (ref 70–99)
Potassium: 4.4 mmol/L (ref 3.5–5.2)
Sodium: 138 mmol/L (ref 134–144)
eGFR: 57 mL/min/{1.73_m2} — ABNORMAL LOW (ref >59)

## 2022-04-12 ENCOUNTER — Encounter: Payer: Self-pay | Admitting: Internal Medicine

## 2022-04-16 ENCOUNTER — Telehealth (INDEPENDENT_AMBULATORY_CARE_PROVIDER_SITE_OTHER): Payer: 59 | Admitting: Internal Medicine

## 2022-04-16 ENCOUNTER — Encounter: Payer: Self-pay | Admitting: Internal Medicine

## 2022-04-16 DIAGNOSIS — Z0000 Encounter for general adult medical examination without abnormal findings: Secondary | ICD-10-CM

## 2022-04-16 MED ORDER — MONTELUKAST SODIUM 10 MG PO TABS
10.0000 mg | ORAL_TABLET | Freq: Every day | ORAL | 3 refills | Status: DC
Start: 2022-04-16 — End: 2023-03-24

## 2022-04-16 MED ORDER — CETIRIZINE HCL 10 MG PO TABS
10.0000 mg | ORAL_TABLET | Freq: Every day | ORAL | 3 refills | Status: DC
Start: 2022-04-16 — End: 2022-12-04

## 2022-04-16 MED ORDER — FLUTICASONE PROPIONATE HFA 44 MCG/ACT IN AERO: 1.0000 | INHALATION_SPRAY | Freq: Every day | RESPIRATORY_TRACT | 11 refills | Status: DC

## 2022-04-16 NOTE — Progress Notes (Signed)
   Subjective:   Patient ID: [name], female    DOB: 02/15/1956, 66 y.o.   MRN: [id number]  HPI I connected with [name] on 04/16/22 at  1:20 PM EDT by a video enabled telemedicine application and verified that I am speaking with the correct person using two identifiers.  The patient and the provider were at separate locations throughout the entire encounter. Patient location: home, Provider location: work   I discussed the limitations of evaluation and management by telemedicine and the availability of in person appointments. The patient expressed understanding and agreed to proceed. The patient and the provider were the only parties present for the visit unless noted in HPI below.  The patient is here for physical.  PMH, [place], social history reviewed and updated  Review of Systems  Constitutional: Negative.   HENT: Negative.    Eyes: Negative.   Respiratory:  Negative for cough, chest tightness and shortness of breath.   Cardiovascular:  Negative for chest pain, palpitations and leg swelling.  Gastrointestinal:  Negative for abdominal distention, abdominal pain, constipation, diarrhea, nausea and vomiting.  Musculoskeletal: Negative.   Skin: Negative.   Neurological: Negative.   Psychiatric/Behavioral: Negative.      Objective:  Physical Exam Constitutional:      Appearance: Normal appearance.  Pulmonary:     Effort: Pulmonary effort is normal.  Neurological:     Mental Status: She is alert and oriented to person, place, and time.     There were no vitals filed for this visit.  Assessment & Plan:  I discussed the assessment and treatment plan with the patient. The patient was provided an opportunity to ask questions and all were answered. The patient agreed with the plan and demonstrated an understanding of the instructions.   The patient was advised to call back or seek an in-person evaluation if the symptoms worsen or if the condition fails to improve as  anticipated.  Hoyt Koch, MD

## 2022-04-16 NOTE — Assessment & Plan Note (Signed)
Flu shot counseled. Covid-19 counseled. Shingrix 1st complete. Tetanus up to date. Colonoscopy up to date. Mammogram up to date with [facility], pap smear up to date with gyn. Counseled about sun safety and mole surveillance. Counseled about the dangers of distracted driving. Given 10 year screening recommendations.

## 2022-05-03 ENCOUNTER — Encounter: Payer: Self-pay | Admitting: Internal Medicine

## 2022-05-03 ENCOUNTER — Telehealth: Payer: Self-pay

## 2022-05-03 NOTE — Telephone Encounter (Signed)
LVM asking patient if she has received her mammogram. If so needing the information of where it was performed.

## 2022-05-22 ENCOUNTER — Encounter: Payer: Self-pay | Admitting: *Deleted

## 2022-05-23 ENCOUNTER — Telehealth: Payer: Self-pay | Admitting: Pharmacist Clinician (PhC)/ Clinical Pharmacy Specialist

## 2022-05-23 NOTE — Telephone Encounter (Signed)
Repatha renewal sent to [place]: [facility]

## 2022-05-31 NOTE — Telephone Encounter (Signed)
LMOM for patient.  When tried to do PA CMM replied with "[facility] does not manage PA for this patient".  Need to know if she has new prescription coverage.

## 2022-06-26 ENCOUNTER — Encounter: Payer: Self-pay | Admitting: Internal Medicine

## 2022-06-26 NOTE — Telephone Encounter (Signed)
Was able to schedule patient a acute visit to be seen with doctor crawford

## 2022-07-05 ENCOUNTER — Telehealth: Payer: Self-pay

## 2022-07-05 ENCOUNTER — Other Ambulatory Visit: Payer: Self-pay | Admitting: Internal Medicine

## 2022-07-05 ENCOUNTER — Encounter: Payer: Self-pay | Admitting: Internal Medicine

## 2022-07-05 ENCOUNTER — Ambulatory Visit: Payer: 59 | Admitting: Internal Medicine

## 2022-07-05 VITALS — BP 122/98 | HR 80 | Temp 98.6°F | Ht 60.0 in | Wt 234.0 lb

## 2022-07-05 DIAGNOSIS — I10 Essential (primary) hypertension: Secondary | ICD-10-CM | POA: Diagnosis not present

## 2022-07-05 DIAGNOSIS — R413 Other amnesia: Secondary | ICD-10-CM | POA: Diagnosis not present

## 2022-07-05 LAB — COMPREHENSIVE METABOLIC PANEL
ALT: 16 U/L (ref 0–35)
AST: 14 U/L (ref 0–37)
Albumin: 4.7 g/dL (ref 3.5–5.2)
Alkaline Phosphatase: 78 U/L (ref 39–117)
BUN: 26 mg/dL — ABNORMAL HIGH (ref 6–23)
CO2: 31 mEq/L (ref 19–32)
Calcium: 10.1 mg/dL (ref 8.4–10.5)
Chloride: 99 mEq/L (ref 96–112)
Creatinine, Ser: 1.05 mg/dL (ref 0.40–1.20)
GFR: 56.3 mL/min — ABNORMAL LOW (ref >60.00)
Glucose, Bld: 91 mg/dL (ref 70–99)
Potassium: 4.1 mEq/L (ref 3.5–5.1)
Sodium: 137 mEq/L (ref 135–145)
Total Bilirubin: 0.5 mg/dL (ref 0.2–1.2)
Total Protein: 7.7 g/dL (ref 6.0–8.3)

## 2022-07-05 LAB — CBC
HCT: 43 % (ref 36.0–46.0)
Hemoglobin: 14.5 g/dL (ref 12.0–15.0)
MCHC: 33.7 g/dL (ref 30.0–36.0)
MCV: 89.9 fl (ref 78.0–100.0)
Platelets: 237 10*3/uL (ref 150.0–400.0)
RBC: 4.79 Mil/uL (ref 3.87–5.11)
RDW: 13.3 % (ref 11.5–15.5)
WBC: 6.3 10*3/uL (ref 4.0–10.5)

## 2022-07-05 LAB — VITAMIN B12: Vitamin B-12: 233 pg/mL (ref 211–911)

## 2022-07-05 LAB — VITAMIN D 25 HYDROXY (VIT D DEFICIENCY, FRACTURES): VITD: 17.63 ng/mL — ABNORMAL LOW (ref 30.00–100.00)

## 2022-07-05 MED ORDER — REPATHA SURECLICK 140 MG/ML ~~LOC~~ SOAJ: SUBCUTANEOUS | 11 refills | Status: DC

## 2022-07-05 MED ORDER — VITAMIN B-12 1000 MCG PO TABS: 1000.0000 ug | ORAL_TABLET | Freq: Every day | ORAL | 3 refills | Status: DC

## 2022-07-05 MED ORDER — VITAMIN D (ERGOCALCIFEROL) 1.25 MG (50000 UNIT) PO CAPS: 50000.0000 [IU] | ORAL_CAPSULE | ORAL | 0 refills | Status: DC

## 2022-07-05 MED ORDER — LOSARTAN POTASSIUM-HCTZ 50-12.5 MG PO TABS: 1.0000 | ORAL_TABLET | Freq: Every day | ORAL | 3 refills | Status: DC

## 2022-07-05 NOTE — Assessment & Plan Note (Signed)
MMSE 30/30 in office. Checking B12 and CBC and CMP and vitamin D and MRI brain. She has had TIA in the past so need to check MRI. Referral to neurology as well.

## 2022-07-05 NOTE — Telephone Encounter (Signed)
PA has been Started for Patient Repatha [employer]  ETK:[id number]

## 2022-07-05 NOTE — Progress Notes (Signed)
   Subjective:   Patient ID: [name], female    DOB: 03-12-1956, 66 y.o.   MRN: [id number]  HPI Thre patient is a 66 YO female coming in for concerns about memory loss. More problems with speech coming out than memory. No problems with getting lost. Some problems with remembering names. No short term memory problems she has noticed.   MMSE: 30/30  Review of Systems  Constitutional: Negative.   HENT: Negative.    Eyes: Negative.   Respiratory:  Negative for cough, chest tightness and shortness of breath.   Cardiovascular:  Negative for chest pain, palpitations and leg swelling.  Gastrointestinal:  Negative for abdominal distention, abdominal pain, constipation, diarrhea, nausea and vomiting.  Musculoskeletal: Negative.   Skin: Negative.   Neurological: Negative.        Memory change  Psychiatric/Behavioral: Negative.      Objective:  Physical Exam Constitutional:      Appearance: She is well-developed.  HENT:     Head: Normocephalic and atraumatic.  Cardiovascular:     Rate and Rhythm: Normal rate and regular rhythm.  Pulmonary:     Effort: Pulmonary effort is normal. No respiratory distress.     Breath sounds: Normal breath sounds. No wheezing or rales.  Abdominal:     General: Bowel sounds are normal. There is no distension.     Palpations: Abdomen is soft.     Tenderness: There is no abdominal tenderness. There is no rebound.  Musculoskeletal:     Cervical back: Normal range of motion.  Skin:    General: Skin is warm and dry.  Neurological:     General: No focal deficit present.     Mental Status: She is alert and oriented to person, place, and time.     Coordination: Coordination normal.     Comments: Speech is fluid at visit without inappropriate words     Vitals:   07/05/22 0837 07/05/22 0849  BP: (!) 122/100 (!) 122/98  Pulse: 80   Temp: 98.6 F (37 C)   TempSrc: Oral   SpO2: 96%   Weight: 234 lb (106.1 kg)   Height: 5' (1.524 m)      Assessment & Plan:  Visit time 25 minutes in face to face communication with patient and coordination of care, additional 5 minutes spent in record review, coordination or care, ordering tests, communicating/referring to other healthcare professionals, documenting in medical records all on the same day of the visit for total time 30 minutes spent on the visit.

## 2022-07-05 NOTE — Assessment & Plan Note (Signed)
Has not taken BP meds this morning. Rx done for losartan/hctz 50/12.5 and prior BP at goal on meds. Will continue at same dosing.

## 2022-07-05 NOTE — Patient Instructions (Signed)
We will check the labs and the MRI and get you in with the neurologist.

## 2022-07-08 ENCOUNTER — Telehealth: Payer: Self-pay | Admitting: Cardiology

## 2022-07-08 NOTE — Telephone Encounter (Signed)
Pt has new insurance which is needed to submit new prior authorization. Last scanned in card is from 10/2020 and was [employer]. [employer] now showing up in rx benefits, however not able to submit prior auth online - "[facility] does not manage PA for this patient. Please contact the number on the back of the members card for further assistance."  Will need phone # from pt's insurance to call and submit prior auth.   Called pt and left a message.

## 2022-07-08 NOTE — Telephone Encounter (Signed)
*  STAT* If patient is at the pharmacy, call can be transferred to refill team.   1. Which medications need to be refilled? (please list name of each medication and dose if known)   Evolocumab (REPATHA SURECLICK) 154 MG/ML SOAJ   2. Which pharmacy/location (including street and city if local pharmacy) is medication to be sent to?  [id number] - [place], [place] - [address] AT [address]    3. Do they need a 30 day or 90 day supply?  30 day  Patient stated she has been out of this medication since October.  Patient stated she will need the pre-authorization sent. Patient would like a call back to confirm pre-authorization.

## 2022-07-10 MED ORDER — REPATHA SURECLICK 140 MG/ML ~~LOC~~ SOAJ: SUBCUTANEOUS | 11 refills | Status: DC

## 2022-07-10 NOTE — Telephone Encounter (Signed)
Prior auth approved through 07/10/23, refill sent to pharmacy and pt made aware.

## 2022-07-10 NOTE — Telephone Encounter (Addendum)
Called pt again. States she still has [employer] through the end of December. Prior auth on this plan did expire in September. Is changing insurance plans in January, advised her we'll need new card info at that time as another prior Josem Kaufmann will need to be completed then. States she'll need a 1 month supply for December once this request is approved.  We do not have [employer] pharmacy coverage card scanned in. She states the Rx member services # on the back is [phone]. Called #, they stated prior auth could be submitted on [email or web address], this has been done, prior auth ID # [id number].

## 2022-07-16 ENCOUNTER — Encounter: Payer: Self-pay | Admitting: Physician Assistant

## 2022-07-23 ENCOUNTER — Ambulatory Visit: Payer: 59 | Admitting: Nurse Practitioner

## 2022-07-23 ENCOUNTER — Encounter: Payer: Self-pay | Admitting: Nurse Practitioner

## 2022-07-23 ENCOUNTER — Ambulatory Visit (INDEPENDENT_AMBULATORY_CARE_PROVIDER_SITE_OTHER)
Admission: RE | Admit: 2022-07-23 | Discharge: 2022-07-23 | Disposition: A | Discharge status: Home / Self Care | Payer: 59 | Source: Ambulatory Visit | Attending: Nurse Practitioner | Admitting: Nurse Practitioner

## 2022-07-23 VITALS — BP 126/84 | HR 84 | Temp 98.0°F | Ht 60.0 in | Wt 235.2 lb

## 2022-07-23 DIAGNOSIS — R0602 Shortness of breath: Secondary | ICD-10-CM | POA: Diagnosis not present

## 2022-07-23 DIAGNOSIS — J4521 Mild intermittent asthma with (acute) exacerbation: Secondary | ICD-10-CM | POA: Diagnosis not present

## 2022-07-23 MED ORDER — ALBUTEROL SULFATE (2.5 MG/3ML) 0.083% IN NEBU
2.5000 mg | INHALATION_SOLUTION | Freq: Once | RESPIRATORY_TRACT | Status: AC
  Administered 2022-07-23: 2.5 mg via RESPIRATORY_TRACT

## 2022-07-23 MED ORDER — GUAIFENESIN-CODEINE 100-10 MG/5ML PO SOLN: 5.0000 mL | Freq: Four times a day (QID) | ORAL | 0 refills | Status: DC | PRN

## 2022-07-23 MED ORDER — METHYLPREDNISOLONE SODIUM SUCC 125 MG IJ SOLR
125.0000 mg | Freq: Once | INTRAMUSCULAR | Status: AC
  Administered 2022-07-23: 125 mg via INTRAMUSCULAR

## 2022-07-23 MED ORDER — METHYLPREDNISOLONE SODIUM SUCC 40 MG IJ SOLR: 40.0000 mg | Freq: Once | INTRAMUSCULAR | Status: DC

## 2022-07-23 NOTE — Patient Instructions (Addendum)
Go to [address] for CXR Use guaifenesin/codeine and albuterol for cough Maintain adequate oral hydration.  "Common cold" symptoms are usually triggered by a virus.  The antibiotics are usually not necessary. On average, a" viral cold" illness may take 7-10 days to resolve.  Acute Bronchitis, Adult  Acute bronchitis is when air tubes in the lungs (bronchi) suddenly get swollen. The condition can make it hard for you to breathe. In adults, acute bronchitis usually goes away within 2 weeks. A cough caused by bronchitis may last up to 3 weeks. Smoking, allergies, and asthma can make the condition worse. What are the causes? Germs that cause cold and flu (viruses). The most common cause of this condition is the virus that causes the common cold. Bacteria. Substances that bother (irritate) the lungs, including: Smoke from cigarettes and other types of tobacco. Dust and pollen. Fumes from chemicals, gases, or burned fuel. Indoor or outdoor air pollution. What increases the risk? A weak body's defense system. This is also called the immune system. Any condition that affects your lungs and breathing, such as asthma. What are the signs or symptoms? A cough. Coughing up clear, yellow, or green mucus. Making high-pitched whistling sounds when you breathe, most often when you breathe out (wheezing). Runny or stuffy nose. Having too much mucus in your lungs (chest congestion). Shortness of breath. Body aches. A sore throat. How is this treated? Acute bronchitis may go away over time without treatment. Your doctor may tell you to: Drink more fluids. This will help thin your mucus so it is easier to cough up. Use a device that gets medicine into your lungs (inhaler). Use a vaporizer or a humidifier. These are machines that add water to the air. This helps with coughing and poor breathing. Take a medicine that thins mucus and helps clear it from your lungs. Take a medicine that prevents or  stops coughing. It is not common to take an antibiotic medicine for this condition. Follow these instructions at home:  Take over-the-counter and prescription medicines only as told by your doctor. Use an inhaler, vaporizer, or humidifier as told by your doctor. Take two teaspoons (10 mL) of honey at bedtime. This helps lessen your coughing at night. Drink enough fluid to keep your pee (urine) pale yellow. Do not smoke or use any products that contain nicotine or tobacco. If you need help quitting, ask your doctor. Get a lot of rest. Return to your normal activities when your doctor says that it is safe. Keep all follow-up visits. How is this prevented?  Wash your hands often with soap and water for at least 20 seconds. If you cannot use soap and water, use hand sanitizer. Avoid contact with people who have cold symptoms. Try not to touch your mouth, nose, or eyes with your hands. Avoid breathing in smoke or chemical fumes. Make sure to get the flu shot every year. Contact a doctor if: Your symptoms do not get better in 2 weeks. You have trouble coughing up the mucus. Your cough keeps you awake at night. You have a fever. Get help right away if: You cough up blood. You have chest pain. You have very bad shortness of breath. You faint or keep feeling like you are going to faint. You have a very bad headache. Your fever or chills get worse. These symptoms may be an emergency. Get help right away. Call your local emergency services (911 in the [place] Do not wait to see if the  symptoms will go away. Do not drive yourself to the hospital. Summary Acute bronchitis is when air tubes in the lungs (bronchi) suddenly get swollen. In adults, acute bronchitis usually goes away within 2 weeks. Drink more fluids. This will help thin your mucus so it is easier to cough up. Take over-the-counter and prescription medicines only as told by your doctor. Contact a doctor if your symptoms do not  improve after 2 weeks of treatment. This information is not intended to replace advice given to you by your health care provider. Make sure you discuss any questions you have with your health care provider. Document Revised: 11/22/2020 Document Reviewed: 11/22/2020 Elsevier Patient Education  [place].

## 2022-07-23 NOTE — Progress Notes (Addendum)
Established Patient Visit  Patient: [name]   DOB: 02-25-1956   66 y.o. Female  MRN: [id number] Visit Date: 08/09/2022  Subjective:    Chief Complaint  Patient presents with   Cough    Cough x 6 days    Cough This is a new problem. The current episode started in the past 7 days. The problem has been unchanged. The problem occurs constantly. The cough is Productive of sputum. Associated symptoms include nasal congestion, postnasal drip, rhinorrhea, shortness of breath and wheezing. Pertinent negatives include no chest pain, chills, fever, myalgias or sore throat. The symptoms are aggravated by lying down. She has tried a beta-agonist inhaler, OTC cough suppressant and prescription cough suppressant for the symptoms. The treatment provided mild relief. Her past medical history is significant for asthma, bronchitis and environmental allergies.   Reviewed medical, surgical, and social history today  Medications: Outpatient Medications Prior to Visit  Medication Sig   albuterol (VENTOLIN HFA) 108 (90 Base) MCG/ACT inhaler Inhale 2 puffs into the lungs every 4 (four) hours as needed for wheezing or shortness of breath.   cetirizine (ZYRTEC) 10 MG tablet Take 1 tablet (10 mg total) by mouth at bedtime.   cyanocobalamin (VITAMIN B12) 1000 MCG tablet Take 1 tablet (1,000 mcg total) by mouth daily.   Evolocumab (REPATHA SURECLICK) 169 MG/ML SOAJ ADMINISTER 1 ML UNDER THE SKIN EVERY 14 DAYS   ezetimibe (ZETIA) 10 MG tablet Take 1 tablet (10 mg total) by mouth daily.   fluticasone (FLONASE) 50 MCG/ACT nasal spray Place 2 sprays into both nostrils 2 (two) times daily as needed.   fluticasone (FLOVENT HFA) 44 MCG/ACT inhaler Inhale 1 puff into the lungs daily.   losartan-hydrochlorothiazide (HYZAAR) 50-12.5 MG tablet Take 1 tablet by mouth daily.   montelukast (SINGULAIR) 10 MG tablet Take 1 tablet (10 mg total) by mouth at bedtime.   Vitamin D, Ergocalciferol, (DRISDOL) 1.25 MG  (50000 UNIT) CAPS capsule Take 1 capsule (50,000 Units total) by mouth every 7 (seven) days.   No facility-administered medications prior to visit.   Reviewed past medical and social history.   ROS per HPI above     Objective:  BP 126/84 (BP Location: Left Arm, Patient Position: Sitting, Cuff Size: Large)   Pulse 84   Temp 98 F (36.7 C) (Temporal)   Ht 5' (1.524 m)   Wt 235 lb 3.2 oz (106.7 kg)   SpO2 95%   BMI 45.93 kg/m      Physical Exam Cardiovascular:     Rate and Rhythm: Normal rate and regular rhythm.     Pulses: Normal pulses.     Heart sounds: Normal heart sounds.  Pulmonary:     Effort: Pulmonary effort is normal. No respiratory distress.     Breath sounds: Wheezing present. No rales.     Comments: Clear lung sounds after albuterol nebulizer treatment. Neurological:     Mental Status: She is alert and oriented to person, place, and time.     No results found for any visits on 07/23/22.    Assessment & Plan:    Problem List Items Addressed This Visit       Respiratory   Asthma - Primary   Relevant Medications   guaiFENesin-codeine 100-10 MG/5ML syrup   Other Relevant Orders   DG Chest 2 View (Completed)   Other Visit Diagnoses     SOB (shortness of breath) on exertion  Relevant Orders   DG Chest 2 View (Completed)     Acute asthma exacerbation due to viral infection. Solumedrol '40mg'$  IM and albuterol nebulizer treatment administered during office visit. No need for oral antibiotics at this time, unless CXR shows otherwise. Consider oral prednisone dose pack if normal CXR. Encourage to maintain adequate oral hydration and use albuterol hand held inhaler as prescribed.  Return if symptoms worsen or fail to improve.     Wilfred Lacy, NP

## 2022-07-24 ENCOUNTER — Other Ambulatory Visit: Payer: 59

## 2022-07-25 ENCOUNTER — Telehealth: Payer: Self-pay | Admitting: Internal Medicine

## 2022-07-25 MED ORDER — PREDNISONE 20 MG PO TABS: 40.0000 mg | ORAL_TABLET | Freq: Every day | ORAL | 0 refills | Status: DC

## 2022-07-25 NOTE — Telephone Encounter (Signed)
Have sent in 5 day course of prednisone which should help with the breathing and wheezing. I do not recommend antibiotics unless she has a pneumonia as most likely this is viral.

## 2022-07-25 NOTE — Telephone Encounter (Signed)
Patient had a chest xray on Tuesday - no one has gotten back with her on this Xray and she is still wheezing - she saw Wilfred Lacy at another practice - patient would like for someone to look at this xray and get back with her.  Patients:  [phone]

## 2022-07-25 NOTE — Telephone Encounter (Signed)
The x-ray has not been read by the radiologist yet. I have glanced at it and do not see a pneumonia. They did give her a steroid shot which can take 2-3 days to kick in fully. Is she having any shortness of breath?

## 2022-07-25 NOTE — Telephone Encounter (Signed)
Called patient back and advised her of doctors crawford note about prednisone.

## 2022-07-30 ENCOUNTER — Ambulatory Visit: Payer: 59 | Admitting: Physician Assistant

## 2022-07-30 ENCOUNTER — Encounter: Payer: Self-pay | Admitting: Physician Assistant

## 2022-07-30 ENCOUNTER — Other Ambulatory Visit: Payer: 59

## 2022-07-30 VITALS — BP 124/89 | Resp 18 | Ht 61.0 in | Wt 240.0 lb

## 2022-07-30 DIAGNOSIS — R4789 Other speech disturbances: Secondary | ICD-10-CM | POA: Diagnosis not present

## 2022-07-30 DIAGNOSIS — R413 Other amnesia: Secondary | ICD-10-CM | POA: Diagnosis not present

## 2022-07-30 NOTE — Progress Notes (Signed)
Assessment/Plan:   [name] is a very pleasant 66 y.o. year old RH female with a history of hypertension, hyperlipidemia, asthma, pre-diabetes, history of TIA 06/2003, fatty infiltration of the liver (followed by GI), endometrial cancer s/p resection 06/2021, seen today for evaluation of word retrieval difficulty. MoCA today is 27/30 Workup is in progress.   Word Finding Difficulty   Await for the MRI brain results ordered by PCP without contrast to assess for underlying structural abnormality and assess vascular load  Continue B12 and Vit D supplementation Check TSH    Folllow up in 2 weeks to discuss the MRI brain and for further plans pending on these results  Subjective:   The patient is here alone. Prior records reviewed   How long did patient have memory difficulties? Overt the last 6-8 months.  She reports : " My communication skills have go down, sentences don't come out, on the phone I cannot complete a story, it is very frustrating. " I love music and I used to know every word of the "American Pie"  song, I cannot say 20 of those words". She is unsure if this was gradual or not. "Sometimes the word I know will not come out"  She does report some difficulties saying names of people, no issues with remembering them. She is able to remember recent conversations repeats oneself?  Denies  Disoriented when walking into a room?  Patient denies   Leaving objects in unusual places?  Patient denies . "Prior to all this I was OCD, I have not been placing stuff in the proper place, like meds for ex.".  Wandering behavior?  Patient denies   Any personality changes since last visit?  "My husband and daughter say I am at my 28 year old level because of my words"   Any worsening depression?:  Patient denies   Hallucinations or paranoia?  Patient denies   Seizures?   Patient denies    Any sleep changes?  Denies  vivid dreams, REM behavior or sleepwalking . She has been referred for sleep  study for snoring, and this is pending Sleep apnea?  Patient unsure Any hygiene concerns?  Patient denies   Independent of bathing and dressing?  Endorsed  Does the patient needs help with medications? Patient is in charge. " I would rather have my meds in the bottle at nightstand than on a pillbox, will lose them".   Who is in charge of the finances?  Patient  is in charge    Any changes in appetite?  Patient denies    Patient have trouble swallowing? Patient denies   Does the patient cook? Endorsed  Any kitchen accidents such as leaving the stove on? Patient denies   Any headaches?  Patient denies "They are days when stressed little needles in the R side of my head, take and Icepack and hold it there till goes away, comes and goes, hit or miss, not frequent, the hair follicles hurt".  Chronic back pain Patient denies   Ambulates with difficulty?   Patient denies   Recent falls or head injuries?  Patient denies     Unilateral weakness, numbness or tingling?  Patient denies  . During her 2004 TIA "I was at a ball game and felt the blood coming out of my body, felt my tongue not moving".  Any tremors?  Patient denies   Any anosmia?  Patient denies   Any incontinence of urine?  Patient denies   Any bowel  dysfunction?   Patient denies      Patient lives  husband  History of heavy alcohol intake?  Patient denies   History of heavy tobacco use?  Patient denies   Family history of dementia?  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 due to her sister having bladder surgery and has to take care of her  Past Medical History:  Diagnosis Date   Arthritis    neck, knees, ankles, feet, back   Asthma, mild intermittent    prn inhaler   Cervical spondylosis    Chronic dyspnea    per pt gets winded w/ stairs, but recovers quickly   Diverticular disease of colon    followed by dr  Collene Mares (GI)---  chronic w/ chronic diarrhea   Essential hypertension    Fatty infiltration of liver    followed by dr Collene Mares--- abd ultrasound in epic 08-31-2020   Frequency of urination    History of chest pain    evaluated by cardiologist-- dr [name],  11/ 2017 nuclear stress test suggest ischemia,  06/2016 cardiac cath , showed normal coronary anatomy and lvf   History of COVID-19    per pt in 2019   treated with presumed covid had  mild to moderate symptoms that resolved   History of diverticulitis of colon    05/ 2022   History of TIA (transient ischemic attack) 06/2003   Hyperlipidemia    Hypertension    Limitation of joint motion of neck    per pt due to bone spurs C2-5   Mild intermittent asthma    followed by pcp   PMB (postmenopausal bleeding)    Pre-diabetes    Pulmonary nodule    followed by pcp,  last hest CT in epic 04-21-2018   Stenosis of cervix    SUI (stress urinary incontinence, female)    Thickened endometrium    TMJ syndrome    Wears glasses      Past Surgical History:  Procedure Laterality Date   APPENDECTOMY  1979   BREAST ENHANCEMENT SURGERY Bilateral 1988   per pt implants removed 2002   BUNIONECTOMY Left 07/17/2007   '@[facility]'$    BUNIONECTOMY Right 05/26/2014   Procedure: RIGHT FOOT LAPIDUS BUNION CORRECTION AMD MODIFIED MCBRIDE BUNIONECTOMY;  Surgeon: Wylene Simmer, MD;  Location: [facility];  Service: Orthopedics;  Laterality: Right;   CARDIAC CATHETERIZATION N/A 06/28/2016   Procedure: Left Heart Cath and Coronary Angiography;  Surgeon: Peter M Martinique, MD;  Location: [facility];  Service: Cardiovascular;  Laterality: N/A;   CESAREAN SECTION  1986   COLONOSCOPY  12/27/2020   by dr Collene Mares   FINGER SURGERY Left 07/28/2015   '@[facility]'$  :  left thumb "knuckle collasped"  arthroplasty   FOOT HARDWARE REMOVAL  04/25/2016   '@[facility]'$ ;   right foot   FOOT SURGERY Right 04/04/2015   '@[facility]'$ ;   first and second toe's   GANGLION CYST EXCISION Right  2016   wrist   HYSTEROSCOPY WITH D & C N/A 05/24/2021   Procedure: DILATATION AND CURETTAGE /HYSTEROSCOPY and MyoSure;  Surgeon: Armandina Stammer, DO;  Location: [facility];  Service: Gynecology;  Laterality: N/A;   NASAL SEPTUM SURGERY  1981   ROTATOR CUFF REPAIR W/ DISTAL CLAVICLE EXCISION Left 08/18/2008   '@[facility]'$  by dr Shellia Carwin   SHOULDER ARTHROSCOPY W/  LABRAL REPAIR Left 06/07/2008   '@[facility]'$ ;  and SAD  by dr Shellia Carwin   TONSILLECTOMY  1964   TUBAL LIGATION Bilateral 1999     Allergies  Allergen Reactions   Doxycycline Diarrhea and Nausea Only    Nausea and diarrhea started a few hours after taking medication.  Diarrhea and nausea continue 2 days after discontinuation.   Hydrocodone Bit-Homatrop Mbr Itching, Anxiety and Other (See Comments)    Made pt confused    Penicillins Shortness Of Breath and Swelling    TONGUE SWELLING Has patient had a PCN reaction causing immediate rash, facial/tongue/throat swelling, SOB or lightheadedness with hypotension: Unknown Has patient had a PCN reaction causing severe rash involving mucus membranes or skin necrosis: Unknown Has patient had a PCN reaction that required hospitalization: Was already in the hospital when happened Has patient had a PCN reaction occurring within the last 10 years: Yes able to take Augmentin If all of the above answers are "NO", then may proc   Promethazine Hcl Shortness Of Breath   Clarithromycin Nausea And Vomiting   Statins Other (See Comments)    LEG CRAMPS   Kiwi Extract Swelling    Current Outpatient Medications  Medication Instructions   albuterol (VENTOLIN HFA) 108 (90 Base) MCG/ACT inhaler 2 puffs, Inhalation, Every 4 hours PRN   cetirizine (ZYRTEC) 10 mg, Oral, Daily at bedtime   cyanocobalamin (VITAMIN B12) 1,000 mcg, Oral, Daily   Evolocumab (REPATHA SURECLICK) 342 MG/ML SOAJ ADMINISTER 1 ML UNDER THE SKIN EVERY 14 DAYS   ezetimibe (ZETIA) 10 mg, Oral, Daily   fluticasone (FLONASE) 50  MCG/ACT nasal spray 2 sprays, Each Nare, 2 times daily PRN   fluticasone (FLOVENT HFA) 44 MCG/ACT inhaler 1 puff, Inhalation, Daily   guaiFENesin-codeine 100-10 MG/5ML syrup 5 mLs, Oral, Every 6 hours PRN   losartan-hydrochlorothiazide (HYZAAR) 50-12.5 MG tablet 1 tablet, Oral, Daily   montelukast (SINGULAIR) 10 mg, Oral, Daily at bedtime   Vitamin D (Ergocalciferol) (DRISDOL) 50,000 Units, Oral, Every 7 days     VITALS:   Vitals:   07/30/22 0753  BP: 124/89  Resp: 18  Weight: 240 lb (108.9 kg)  Height: '5\' 1"'$  (1.549 m)       PHYSICAL EXAM   HEENT:  Normocephalic, atraumatic. The mucous membranes are moist. The superficial temporal arteries are without ropiness or tenderness. Cardiovascular: Regular rate and rhythm. Lungs: Clear to auscultation bilaterally. Neck: There are no carotid bruits noted bilaterally.  NEUROLOGICAL:    07/30/2022    8:00 AM  Montreal Cognitive Assessment   Visuospatial/ Executive (0/5) 4  Naming (0/3) 3  Attention: Read list of digits (0/2) 2  Attention: Read list of letters (0/1) 1  Attention: Serial 7 subtraction starting at 100 (0/3) 3  Language: Repeat phrase (0/2) 2  Language : Fluency (0/1) 0  Abstraction (0/2) 2  Delayed Recall (0/5) 4  Orientation (0/6) 6  Total 27  Adjusted Score (based on education) 27        No data to display           Orientation:  Alert and oriented to person, place and time. No aphasia or dysarthria. Fund of knowledge is appropriate. Recent memory impaired and remote memory intact.  Attention and concentration are normal.  Able to name objects and repeat phrases. Delayed recall  4/5 Cranial nerves: There is good facial symmetry. Extraocular muscles are intact and visual fields are full to confrontational testing. Speech is fluent and clear. No tongue deviation. Hearing is  intact to conversational tone. Tone: Tone is good throughout. Sensation: Sensation is intact to light touch and pinprick throughout.  Vibration is intact at the bilateral big toe.There is no extinction with double simultaneous stimulation. There is no sensory dermatomal level identified. Coordination: The patient has no difficulty with RAM's or FNF bilaterally. Normal finger to nose  Motor: Strength is 5/5 in the bilateral upper and lower extremities. There is no pronator drift. There are no fasciculations noted. DTR's: Deep tendon reflexes are 2/4 at the bilateral biceps, triceps, brachioradialis, patella and achilles.  Plantar responses are downgoing bilaterally. Gait and Station: The patient is able to ambulate without difficulty.The patient is able to heel toe walk without any difficulty.The patient is able to ambulate in a tandem fashion. The patient is able to stand in the Romberg position.     Thank you for allowing [place] the opportunity to participate in the care of this nice patient. Please do not hesitate to contact us for any questions or concerns.   Total time spent on today's visit was 50 minutes dedicated to this patient today, preparing to see patient, examining the patient, ordering tests and/or medications and counseling the patient, documenting clinical information in the EHR or other health record, independently interpreting results and communicating results to the patient/family, discussing treatment and goals, answering patient's questions and coordinating care.  Cc:  Hoyt Koch, MD  Sharene Butters 07/30/2022 8:55 AM

## 2022-07-30 NOTE — Patient Instructions (Signed)
Await for the MRI brain results Check TSH Follow up in 2 weeks

## 2022-08-02 ENCOUNTER — Telehealth: Payer: Self-pay

## 2022-08-02 NOTE — Telephone Encounter (Signed)
Pt has stated she would for her MRI to be available for her to see on MyChart and it is not. She asked that Dr. Sharlet Salina please inform her of her results and send her a copy of the report to her MyChart.

## 2022-08-02 NOTE — Telephone Encounter (Signed)
I don't see this anywhere in our computer system, did she have MRI done and if so where? Please fax for records

## 2022-08-06 NOTE — Telephone Encounter (Signed)
Per chart. pt is requesting MRI be done at [facility].  Called pt to see if she had done. No answer LMOM RTC.Marland KitchenJohny Valenzuela

## 2022-08-10 ENCOUNTER — Other Ambulatory Visit: Payer: Self-pay | Admitting: Cardiology

## 2022-08-13 NOTE — Telephone Encounter (Signed)
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 No new strokes or masses. This will not be in her mychart since it was not done in our system.

## 2022-08-15 NOTE — Telephone Encounter (Signed)
Called and left patient a voicemail and informed her if she has any other questions or concerns to give out office a call back

## 2022-08-16 ENCOUNTER — Ambulatory Visit: Payer: 59 | Admitting: Physician Assistant

## 2022-08-28 ENCOUNTER — Ambulatory Visit: Payer: 59 | Admitting: Physician Assistant

## 2022-08-30 ENCOUNTER — Telehealth: Payer: BLUE CROSS/BLUE SHIELD | Admitting: Physician Assistant

## 2022-08-30 ENCOUNTER — Telehealth: Payer: BLUE CROSS/BLUE SHIELD | Admitting: Emergency Medicine

## 2022-08-30 ENCOUNTER — Telehealth: Payer: BLUE CROSS/BLUE SHIELD | Admitting: Nurse Practitioner

## 2022-08-30 DIAGNOSIS — U071 COVID-19: Secondary | ICD-10-CM | POA: Diagnosis not present

## 2022-08-30 DIAGNOSIS — R6889 Other general symptoms and signs: Secondary | ICD-10-CM

## 2022-08-30 MED ORDER — BENZONATATE 100 MG PO CAPS: 100.0000 mg | ORAL_CAPSULE | Freq: Three times a day (TID) | ORAL | 0 refills | Status: DC | PRN

## 2022-08-30 MED ORDER — MOLNUPIRAVIR EUA 200MG CAPSULE: 4.0000 | ORAL_CAPSULE | Freq: Two times a day (BID) | ORAL | 0 refills | Status: AC

## 2022-08-30 MED ORDER — ALBUTEROL SULFATE (2.5 MG/3ML) 0.083% IN NEBU: 2.5000 mg | INHALATION_SOLUTION | Freq: Four times a day (QID) | RESPIRATORY_TRACT | 1 refills | Status: AC | PRN

## 2022-08-30 MED ORDER — ALBUTEROL SULFATE HFA 108 (90 BASE) MCG/ACT IN AERS: 1.0000 | INHALATION_SPRAY | RESPIRATORY_TRACT | 0 refills | Status: AC | PRN

## 2022-08-30 NOTE — Patient Instructions (Signed)
Marlis Edelson, thank you for joining Mar Daring, PA-C for today's virtual visit.  While this provider is not your primary care provider (PCP), if your PCP is located in our provider database this encounter information will be shared with them immediately following your visit.   [place] account gives you access to today's visit and all your visits, tests, and labs performed at [facility] " click here if you don't have a [place] account or go to [email or web address]  Consent: (Patient) Marlis Edelson provided verbal consent for this virtual visit at the beginning of the encounter.  Current Medications:  Current Outpatient Medications:    albuterol (PROVENTIL) (2.5 MG/3ML) 0.083% nebulizer solution, Take 3 mLs (2.5 mg total) by nebulization every 6 (six) hours as needed for wheezing or shortness of breath., Disp: 150 mL, Rfl: 1   benzonatate (TESSALON) 100 MG capsule, Take 1 capsule (100 mg total) by mouth 3 (three) times daily as needed., Disp: 30 capsule, Rfl: 0   molnupiravir EUA (LAGEVRIO) 200 mg CAPS capsule, Take 4 capsules (800 mg total) by mouth 2 (two) times daily for 5 days., Disp: 40 capsule, Rfl: 0   albuterol (VENTOLIN HFA) 108 (90 Base) MCG/ACT inhaler, Inhale 1-2 puffs into the lungs every 4 (four) hours as needed for wheezing or shortness of breath., Disp: 18 g, Rfl: 0   cetirizine (ZYRTEC) 10 MG tablet, Take 1 tablet (10 mg total) by mouth at bedtime., Disp: 90 tablet, Rfl: 3   cyanocobalamin (VITAMIN B12) 1000 MCG tablet, Take 1 tablet (1,000 mcg total) by mouth daily., Disp: 90 tablet, Rfl: 3   Evolocumab (REPATHA SURECLICK) 381 MG/ML SOAJ, ADMINISTER 1 ML UNDER THE SKIN EVERY 14 DAYS, Disp: 2 mL, Rfl: 11   ezetimibe (ZETIA) 10 MG tablet, TAKE 1 TABLET(10 MG) BY MOUTH DAILY, Disp: 90 tablet, Rfl: 3   fluticasone (FLONASE) 50 MCG/ACT nasal spray, Place 2 sprays into both nostrils 2 (two) times daily as needed., Disp: , Rfl: 5    fluticasone (FLOVENT HFA) 44 MCG/ACT inhaler, Inhale 1 puff into the lungs daily., Disp: 10.6 g, Rfl: 11   guaiFENesin-codeine 100-10 MG/5ML syrup, Take 5 mLs by mouth every 6 (six) hours as needed for cough. (Patient not taking: Reported on 07/30/2022), Disp: 118 mL, Rfl: 0   losartan-hydrochlorothiazide (HYZAAR) 50-12.5 MG tablet, Take 1 tablet by mouth daily., Disp: 90 tablet, Rfl: 3   montelukast (SINGULAIR) 10 MG tablet, Take 1 tablet (10 mg total) by mouth at bedtime., Disp: 90 tablet, Rfl: 3   Vitamin D, Ergocalciferol, (DRISDOL) 1.25 MG (50000 UNIT) CAPS capsule, Take 1 capsule (50,000 Units total) by mouth every 7 (seven) days., Disp: 12 capsule, Rfl: 0   Medications ordered in this encounter:  Meds ordered this encounter  Medications   albuterol (PROVENTIL) (2.5 MG/3ML) 0.083% nebulizer solution    Sig: Take 3 mLs (2.5 mg total) by nebulization every 6 (six) hours as needed for wheezing or shortness of breath.    Dispense:  150 mL    Refill:  1    Order Specific Question:   Supervising Provider    Answer:   Chase Picket [[id number]]   molnupiravir EUA (LAGEVRIO) 200 mg CAPS capsule    Sig: Take 4 capsules (800 mg total) by mouth 2 (two) times daily for 5 days.    Dispense:  40 capsule    Refill:  0    Order Specific Question:   Supervising Provider    Answer:  LAMPTEY, PHILIP O [[id number]]   benzonatate (TESSALON) 100 MG capsule    Sig: Take 1 capsule (100 mg total) by mouth 3 (three) times daily as needed.    Dispense:  30 capsule    Refill:  0    Order Specific Question:   Supervising Provider    Answer:   Bari Mantis   albuterol (VENTOLIN HFA) 108 (90 Base) MCG/ACT inhaler    Sig: Inhale 1-2 puffs into the lungs every 4 (four) hours as needed for wheezing or shortness of breath.    Dispense:  18 g    Refill:  0    Order Specific Question:   Supervising Provider    Answer:   Chase Picket [id number]     *If you need refills on other medications prior  to your next appointment, please contact your pharmacy*  Follow-Up: Call back or seek an in-person evaluation if the symptoms worsen or if the condition fails to improve as anticipated.  [place] [phone]  Isolation Instructions: You are to isolate at home for 5 days from onset of your symptoms. If you must be around other household members who do not have symptoms, you need to make sure that both you and the family members are masking consistently with a high-quality mask.  After day 5 of isolation, if you have had no fever within 24 hours and you are feeling better, you can end isolation but need to mask for an additional 5 days.  After day 5 if you have a fever or are having significant symptoms, please isolate for full 10 days.  If you note any worsening of symptoms despite treatment, please seek an in-person evaluation ASAP. If you note any significant shortness of breath or any chest pain, please seek ER evaluation. Please do not delay care!   COVID-19: What to Do if You Are Sick If you test positive and are an older adult or someone who is at high risk of getting very sick from COVID-19, treatment may be available. Contact a healthcare provider right away after a positive test to determine if you are eligible, even if your symptoms are mild right now. You can also visit a Test to Treat location and, if eligible, receive a prescription from a provider. Don't delay: Treatment must be started within the first few days to be effective. If you have a fever, cough, or other symptoms, you might have COVID-19. Most people have mild illness and are able to recover at home. If you are sick: Keep track of your symptoms. If you have an emergency warning sign (including trouble breathing), call 911. Steps to help prevent the spread of COVID-19 if you are sick If you are sick with COVID-19 or think you might have COVID-19, follow the steps below to care for yourself and to help  protect other people in your home and community. Stay home except to get medical care Stay home. Most people with COVID-19 have mild illness and can recover at home without medical care. Do not leave your home, except to get medical care. Do not visit public areas and do not go to places where you are unable to wear a mask. Take care of yourself. Get rest and stay hydrated. Take over-the-counter medicines, such as acetaminophen, to help you feel better. Stay in touch with your doctor. Call before you get medical care. Be sure to get care if you have trouble breathing, or have any other emergency warning signs,  or if you think it is an emergency. Avoid public transportation, ride-sharing, or taxis if possible. Get tested If you have symptoms of COVID-19, get tested. While waiting for test results, stay away from others, including staying apart from those living in your household. Get tested as soon as possible after your symptoms start. Treatments may be available for people with COVID-19 who are at risk for becoming very sick. Don't delay: Treatment must be started early to be effective--some treatments must begin within 5 days of your first symptoms. Contact your healthcare provider right away if your test result is positive to determine if you are eligible. Self-tests are one of several options for testing for the virus that causes COVID-19 and may be more convenient than laboratory-based tests and point-of-care tests. Ask your healthcare provider or your local health department if you need help interpreting your test results. You can visit your state, tribal, local, and territorial health department's website to look for the latest local information on testing sites. Separate yourself from other people As much as possible, stay in a specific room and away from other people and pets in your home. If possible, you should use a separate bathroom. If you need to be around other people or animals in or  outside of the home, wear a well-fitting mask. Tell your close contacts that they may have been exposed to COVID-19. An infected person can spread COVID-19 starting 48 hours (or 2 days) before the person has any symptoms or tests positive. By letting your close contacts know they may have been exposed to COVID-19, you are helping to protect everyone. See COVID-19 and Animals if you have questions about pets. If you are diagnosed with COVID-19, someone from the health department may call you. Answer the call to slow the spread. Monitor your symptoms Symptoms of COVID-19 include fever, cough, or other symptoms. Follow care instructions from your healthcare provider and local health department. Your local health authorities may give instructions on checking your symptoms and reporting information. When to seek emergency medical attention Look for emergency warning signs* for COVID-19. If someone is showing any of these signs, seek emergency medical care immediately: Trouble breathing Persistent pain or pressure in the chest New confusion Inability to wake or stay awake Pale, gray, or blue-colored skin, lips, or nail beds, depending on skin tone *This list is not all possible symptoms. Please call your medical provider for any other symptoms that are severe or concerning to you. Call 911 or call ahead to your local emergency facility: Notify the operator that you are seeking care for someone who has or may have COVID-19. Call ahead before visiting your doctor Call ahead. Many medical visits for routine care are being postponed or done by phone or telemedicine. If you have a medical appointment that cannot be postponed, call your doctor's office, and tell them you have or may have COVID-19. This will help the office protect themselves and other patients. If you are sick, wear a well-fitting mask You should wear a mask if you must be around other people or animals, including pets (even at home). Wear  a mask with the best fit, protection, and comfort for you. You don't need to wear the mask if you are alone. If you can't put on a mask (because of trouble breathing, for example), cover your coughs and sneezes in some other way. Try to stay at least 6 feet away from other people. This will help protect the people around you. Masks should  not be placed on young children under age 36 years, anyone who has trouble breathing, or anyone who is not able to remove the mask without help. Cover your coughs and sneezes Cover your mouth and nose with a tissue when you cough or sneeze. Throw away used tissues in a lined trash can. Immediately wash your hands with soap and water for at least 20 seconds. If soap and water are not available, clean your hands with an alcohol-based hand sanitizer that contains at least 60% alcohol. Clean your hands often Wash your hands often with soap and water for at least 20 seconds. This is especially important after blowing your nose, coughing, or sneezing; going to the bathroom; and before eating or preparing food. Use hand sanitizer if soap and water are not available. Use an alcohol-based hand sanitizer with at least 60% alcohol, covering all surfaces of your hands and rubbing them together until they feel dry. Soap and water are the best option, especially if hands are visibly dirty. Avoid touching your eyes, nose, and mouth with unwashed hands. Handwashing Tips Avoid sharing personal household items Do not share dishes, drinking glasses, cups, eating utensils, towels, or bedding with other people in your home. Wash these items thoroughly after using them with soap and water or put in the dishwasher. Clean surfaces in your home regularly Clean and disinfect high-touch surfaces (for example, doorknobs, tables, handles, light switches, and countertops) in your "sick room" and bathroom. In shared spaces, you should clean and disinfect surfaces and items after each use by the  person who is ill. If you are sick and cannot clean, a caregiver or other person should only clean and disinfect the area around you (such as your bedroom and bathroom) on an as needed basis. Your caregiver/other person should wait as long as possible (at least several hours) and wear a mask before entering, cleaning, and disinfecting shared spaces that you use. Clean and disinfect areas that may have blood, stool, or body fluids on them. Use household cleaners and disinfectants. Clean visible dirty surfaces with household cleaners containing soap or detergent. Then, use a household disinfectant. Use a product from [employer] List N: Disinfectants for Coronavirus (INOMV-67). Be sure to follow the instructions on the label to ensure safe and effective use of the product. Many products recommend keeping the surface wet with a disinfectant for a certain period of time (look at "contact time" on the product label). You may also need to wear personal protective equipment, such as gloves, depending on the directions on the product label. Immediately after disinfecting, wash your hands with soap and water for 20 seconds. For completed guidance on cleaning and disinfecting your home, visit Complete Disinfection Guidance. Take steps to improve ventilation at home Improve ventilation (air flow) at home to help prevent from spreading COVID-19 to other people in your household. Clear out COVID-19 virus particles in the air by opening windows, using air filters, and turning on fans in your home. Use this interactive tool to learn how to improve air flow in your home. When you can be around others after being sick with COVID-19 Deciding when you can be around others is different for different situations. Find out when you can safely end home isolation. For any additional questions about your care, contact your healthcare provider or state or local health department. 10/24/2020 Content source: [facility]  [facility] ([facility]), Division of Viral Diseases This information is not intended to replace advice given to you  by your health care provider. Make sure you discuss any questions you have with your health care provider. Document Revised: 12/07/2020 Document Reviewed: 12/07/2020 Elsevier Patient Education  2022 [employer].   If you have been instructed to have an in-person evaluation today at a local Urgent Care facility, please use the link below. It will take you to a list of all of our available [place] [facility] [facility], including address, phone number and hours of operation. Please do not delay care.  [facility]  If you or a family member do not have a primary care provider, use the link below to schedule a visit and establish care. When you choose a [facility] primary care physician or advanced practice provider, you gain a long-term partner in health. Find a Primary Care Provider  Learn more about [facility]'s in-office and virtual care options: [facility]

## 2022-08-30 NOTE — Progress Notes (Signed)
[name]  To give you a better plan we suggest you reschedule with [place] for a video visit.   You do this by going back to your Mychart and selecting Virtual Urgent Care visit. We will then be able to connect over a video on your phone to discuss COVID treatment plans.   Thank you [name]    we recommend that you convert this visit to a video visit in order for the provider to better assess what is going on.  The provider will be able to give you a more accurate diagnosis and treatment plan if we can more freely discuss your symptoms and with the addition of a virtual examination.   If you convert to a video visit, we will bill your insurance (similar to an office visit) and you will not be charged for this e-Visit. You will be able to stay at home and speak with the first available [facility] advanced practice provider. The link to do a video visit is in the drop down Menu tab of your Welcome screen in [name].

## 2022-08-30 NOTE — Progress Notes (Signed)
Virtual Visit Consent   [name], you are scheduled for a virtual visit with a [facility] provider today. Just as with appointments in the office, your consent must be obtained to participate. Your consent will be active for this visit and any virtual visit you may have with one of our providers in the next 365 days. If you have a MyChart account, a copy of this consent can be sent to you electronically.  As this is a virtual visit, video technology does not allow for your provider to perform a traditional examination. This may limit your provider's ability to fully assess your condition. If your provider identifies any concerns that need to be evaluated in person or the need to arrange testing (such as labs, EKG, etc.), we will make arrangements to do so. Although advances in technology are sophisticated, we cannot ensure that it will always work on either your end or our end. If the connection with a video visit is poor, the visit may have to be switched to a telephone visit. With either a video or telephone visit, we are not always able to ensure that we have a secure connection.  By engaging in this virtual visit, you consent to the provision of healthcare and authorize for your insurance to be billed (if applicable) for the services provided during this visit. Depending on your insurance coverage, you may receive a charge related to this service.  I need to obtain your verbal consent now. Are you willing to proceed with your visit today? [name] has provided verbal consent on 08/30/2022 for a virtual visit (video or telephone). Mar Daring, PA-C  Date: 08/30/2022 11:44 AM  Virtual Visit via Video Note   I, Mar Daring, connected with  [name]  ([id number], [birth year]) on 08/30/22 at 11:30 AM EST by a video-enabled telemedicine application and verified that I am speaking with the correct person using two identifiers.  Location: Patient: Virtual Visit Location  Patient: Home Provider: Virtual Visit Location Provider: Home Office   I discussed the limitations of evaluation and management by telemedicine and the availability of in person appointments. The patient expressed understanding and agreed to proceed.    History of Present Illness: [name] is a 67 y.o. who identifies as a female who was assigned female at birth, and is being seen today for Covid 60.  HPI: URI  This is a new problem. The current episode started yesterday (Symptoms started on 08/29/22; Husband was positive prior; she tested positive on at home testing this morning). The problem has been gradually worsening. The maximum temperature recorded prior to her arrival was 102 - 102.9 F (102.4). The fever has been present for Less than 1 day. Associated symptoms include congestion, coughing, headaches and sinus pain. Pertinent negatives include no diarrhea, ear pain, nausea, plugged ear sensation, rhinorrhea, sore throat or vomiting. Associated symptoms comments: Chills, myalgias, back pain. She has tried acetaminophen for the symptoms. The treatment provided mild relief.    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   Radiculopathy of lumbar region 04/29/2017   Spondylolisthesis of lumbar region 04/29/2017   Spondylosis without myelopathy or radiculopathy, cervical region 04/29/2017   Pain from implanted hardware 04/04/2016   Morbid obesity ([place]) 03/19/2016  Primary osteoarthritis of first carpometacarpal joint of left hand 07/13/2015   History of TIA (transient ischemic attack) 03/14/2015   Compression of nerve 03/08/2015   Metatarsalgia of right foot 03/08/2015   Sesamoiditis 02/08/2015   Routine general medical examination at a health  care facility 11/23/2012   Neuralgia 03/16/2012   GERD 06/14/2009   Essential hypertension 06/16/2008   Hyperlipidemia 12/29/2007   Hyperglycemia 11/26/2007    Allergies:  Allergies  Allergen Reactions   Doxycycline Diarrhea and Nausea Only    Nausea and diarrhea started a few hours after taking medication.  Diarrhea and nausea continue 2 days after discontinuation.   Hydrocodone Bit-Homatrop Mbr Itching, Anxiety and Other (See Comments)    Made pt confused    Penicillins Shortness Of Breath and Swelling    TONGUE SWELLING Has patient had a PCN reaction causing immediate rash, facial/tongue/throat swelling, SOB or lightheadedness with hypotension: Unknown Has patient had a PCN reaction causing severe rash involving mucus membranes or skin necrosis: Unknown Has patient had a PCN reaction that required hospitalization: Was already in the hospital when happened Has patient had a PCN reaction occurring within the last 10 years: Yes able to take Augmentin If all of the above answers are "NO", then may proc   Promethazine Hcl Shortness Of Breath   Clarithromycin Nausea And Vomiting   Statins Other (See Comments)    LEG CRAMPS   Kiwi Extract Swelling   Medications:  Current Outpatient Medications:    albuterol (PROVENTIL) (2.5 MG/3ML) 0.083% nebulizer solution, Take 3 mLs (2.5 mg total) by nebulization every 6 (six) hours as needed for wheezing or shortness of breath., Disp: 150 mL, Rfl: 1   benzonatate (TESSALON) 100 MG capsule, Take 1 capsule (100 mg total) by mouth 3 (three) times daily as needed., Disp: 30 capsule, Rfl: 0   molnupiravir EUA (LAGEVRIO) 200 mg CAPS capsule, Take 4 capsules (800 mg total) by mouth 2 (two) times daily for 5 days., Disp: 40 capsule, Rfl: 0   albuterol (VENTOLIN HFA) 108 (90 Base) MCG/ACT inhaler, Inhale 1-2 puffs into the lungs every 4 (four) hours as needed for wheezing or shortness of breath., Disp: 18 g, Rfl: 0   cetirizine (ZYRTEC) 10 MG tablet, Take 1  tablet (10 mg total) by mouth at bedtime., Disp: 90 tablet, Rfl: 3   cyanocobalamin (VITAMIN B12) 1000 MCG tablet, Take 1 tablet (1,000 mcg total) by mouth daily., Disp: 90 tablet, Rfl: 3   Evolocumab (REPATHA SURECLICK) 325 MG/ML SOAJ, ADMINISTER 1 ML UNDER THE SKIN EVERY 14 DAYS, Disp: 2 mL, Rfl: 11   ezetimibe (ZETIA) 10 MG tablet, TAKE 1 TABLET(10 MG) BY MOUTH DAILY, Disp: 90 tablet, Rfl: 3   fluticasone (FLONASE) 50 MCG/ACT nasal spray, Place 2 sprays into both nostrils 2 (two) times daily as needed., Disp: , Rfl: 5   fluticasone (FLOVENT HFA) 44 MCG/ACT inhaler, Inhale 1 puff into the lungs daily., Disp: 10.6 g, Rfl: 11   guaiFENesin-codeine 100-10 MG/5ML syrup, Take 5 mLs by mouth every 6 (six) hours as needed for cough. (Patient not taking: Reported on 07/30/2022), Disp: 118 mL, Rfl: 0   losartan-hydrochlorothiazide (HYZAAR) 50-12.5 MG tablet, Take 1 tablet by mouth daily., Disp: 90 tablet, Rfl: 3   montelukast (SINGULAIR) 10 MG tablet, Take 1 tablet (10 mg total) by mouth at bedtime., Disp: 90 tablet, Rfl: 3   Vitamin D, Ergocalciferol, (DRISDOL) 1.25 MG (50000 UNIT) CAPS capsule, Take 1 capsule (50,000 Units total) by mouth every 7 (seven) days., Disp:  12 capsule, Rfl: 0  Observations/Objective: Patient is well-developed, well-nourished in no acute distress.  Resting comfortably at home.  Head is normocephalic, atraumatic.  No labored breathing.  Speech is clear and coherent with logical content.  Patient is alert and oriented at baseline.    Assessment and Plan: 1. COVID-19 - albuterol (PROVENTIL) (2.5 MG/3ML) 0.083% nebulizer solution; Take 3 mLs (2.5 mg total) by nebulization every 6 (six) hours as needed for wheezing or shortness of breath.  Dispense: 150 mL; Refill: 1 - molnupiravir EUA (LAGEVRIO) 200 mg CAPS capsule; Take 4 capsules (800 mg total) by mouth 2 (two) times daily for 5 days.  Dispense: 40 capsule; Refill: 0 - benzonatate (TESSALON) 100 MG capsule; Take 1 capsule  (100 mg total) by mouth 3 (three) times daily as needed.  Dispense: 30 capsule; Refill: 0 - albuterol (VENTOLIN HFA) 108 (90 Base) MCG/ACT inhaler; Inhale 1-2 puffs into the lungs every 4 (four) hours as needed for wheezing or shortness of breath.  Dispense: 18 g; Refill: 0  - Continue OTC symptomatic management of choice - Will send OTC vitamins and supplement information through AVS - Molnupiravir, tessalon perles and Albuterol prescribed - Patient enrolled in MyChart symptom monitoring - Push fluids - Rest as needed - Discussed return precautions and when to seek in-person evaluation, sent via AVS as well   Follow Up Instructions: I discussed the assessment and treatment plan with the patient. The patient was provided an opportunity to ask questions and all were answered. The patient agreed with the plan and demonstrated an understanding of the instructions.  A copy of instructions were sent to the patient via MyChart unless otherwise noted below.    The patient was advised to call back or seek an in-person evaluation if the symptoms worsen or if the condition fails to improve as anticipated.  Time:  I spent 12 minutes with the patient via telehealth technology discussing the above problems/concerns.    Mar Daring, PA-C

## 2022-08-30 NOTE — Progress Notes (Signed)
   Thank you for the details you included in the comment boxes. Those details are very helpful in determining the best course of treatment for you and help [place] to provide the best care. Because of these symptoms, we recommend that you convert this visit to a video visit in order for the provider to better assess what is going on.  This will also allow [place] to prescribe an antiviral if needed.  The provider will be able to give you a more accurate diagnosis and treatment plan if we can more freely discuss your symptoms and with the addition of a virtual examination.   If you convert to a video visit, we will bill your insurance (similar to an office visit) and you will not be charged for this e-Visit. You will be able to stay at home and speak with the first available [facility] advanced practice provider. The link to do a video visit is in the drop down Menu tab of your Welcome screen in [place].    Approximately 5 minutes was used in reviewing the patient's chart, questionnaire, prescribing medications, and documentation.

## 2022-09-06 ENCOUNTER — Telehealth: Payer: BLUE CROSS/BLUE SHIELD | Admitting: Internal Medicine

## 2022-09-13 ENCOUNTER — Ambulatory Visit (INDEPENDENT_AMBULATORY_CARE_PROVIDER_SITE_OTHER): Payer: BLUE CROSS/BLUE SHIELD | Admitting: Family Medicine

## 2022-09-13 ENCOUNTER — Encounter: Payer: Self-pay | Admitting: Family Medicine

## 2022-09-13 VITALS — BP 118/80 | HR 85 | Temp 98.1°F | Ht 61.0 in | Wt 241.7 lb

## 2022-09-13 DIAGNOSIS — R058 Other specified cough: Secondary | ICD-10-CM

## 2022-09-13 DIAGNOSIS — H9201 Otalgia, right ear: Secondary | ICD-10-CM | POA: Diagnosis not present

## 2022-09-13 NOTE — Progress Notes (Signed)
Established Patient Office Visit  Subjective   Patient ID: [name], female    DOB: November 23, 1955  Age: 67 y.o. MRN: [id number]  Chief Complaint  Patient presents with   Otalgia    Patient complains of right ear pain and swelling, x1 day,     HPI   [name] is seen as a work and with 1 day history of periauricular pain on the right side.  She states that she was diagnosed with COVID second week of January.  She and her husband both had COVID.  She felt poorly for 3 to 4 days and then was gradually recovering but then developed dry cough for the past month or so.  No dyspnea.  What brought her in today is acute 1 day history of pain just inferior to the right ear.  She folic she noted a little bit of swelling.  No sore throat.  No acute hearing changes.  No fevers or chills.  No visible erythema.  She states when she moves her outer ear she hears a "popping and cracking "sound deep in the ear.  No vertigo.  No tinnitus.  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  Past Medical History:  Diagnosis Date   Arthritis    neck, knees, ankles, feet, back   Asthma, mild intermittent    prn inhaler   Cervical spondylosis    Chronic dyspnea    per pt gets winded w/ stairs, but recovers quickly   Diverticular disease of colon    followed by dr Collene Mares (GI)---  chronic w/ chronic diarrhea   Essential hypertension    Fatty infiltration of liver    followed by dr Collene Mares--- abd ultrasound in epic 08-31-2020   Frequency of urination    History of chest pain    evaluated by cardiologist-- dr [name],  11/ 2017 nuclear stress test suggest ischemia,  06/2016 cardiac cath , showed normal coronary anatomy and lvf   History of COVID-19    per pt in 2019   treated with presumed covid had  mild to moderate symptoms that resolved   History of diverticulitis of colon    05/ 2022   History of TIA (transient ischemic attack) 06/2003   Hyperlipidemia     Hypertension    Limitation of joint motion of neck    per pt due to bone spurs C2-5   Mild intermittent asthma    followed by pcp   PMB (postmenopausal bleeding)    Pre-diabetes    Pulmonary nodule    followed by pcp,  last hest CT in epic 04-21-2018   Stenosis of cervix    SUI (stress urinary incontinence, female)    Thickened endometrium    TMJ syndrome    Wears glasses    Past Surgical History:  Procedure Laterality Date   APPENDECTOMY  1979   BREAST ENHANCEMENT SURGERY Bilateral 1988   per pt implants removed 2002   BUNIONECTOMY Left 07/17/2007   @[facility]    BUNIONECTOMY Right 05/26/2014   Procedure: RIGHT FOOT LAPIDUS BUNION CORRECTION AMD MODIFIED MCBRIDE BUNIONECTOMY;  Surgeon: Wylene Simmer, MD;  Location: [facility];  Service: Orthopedics;  Laterality: Right;   CARDIAC CATHETERIZATION N/A 06/28/2016   Procedure: Left Heart Cath and Coronary Angiography;  Surgeon: Peter M Martinique, MD;  Location: [facility];  Service: Cardiovascular;  Laterality: N/A;   [place]   COLONOSCOPY  12/27/2020  by dr Collene Mares   FINGER SURGERY Left 07/28/2015   @[facility]  :  left thumb "knuckle collasped"  arthroplasty   FOOT HARDWARE REMOVAL  04/25/2016   @[facility] ;   right foot   FOOT SURGERY Right 04/04/2015   @[facility] ;   first and second toe's   GANGLION CYST EXCISION Right 2016   wrist   HYSTEROSCOPY WITH D & C N/A 05/24/2021   Procedure: DILATATION AND CURETTAGE /HYSTEROSCOPY and MyoSure;  Surgeon: Armandina Stammer, DO;  Location: [facility];  Service: Gynecology;  Laterality: N/A;   NASAL SEPTUM SURGERY  1981   ROTATOR CUFF REPAIR W/ DISTAL CLAVICLE EXCISION Left 08/18/2008   @[facility]$  by dr Shellia Carwin   SHOULDER ARTHROSCOPY W/ LABRAL REPAIR Left 06/07/2008   @[facility]$ ;  and SAD  by dr Shellia Carwin   TONSILLECTOMY  1964   TUBAL LIGATION Bilateral 1999    reports that she quit smoking about 17 years ago. Her smoking use included cigarettes. She has never used  smokeless tobacco. She reports that she does not currently use alcohol. She reports that she does not use drugs. family history includes Atrial fibrillation in her father; Cancer in her brother and mother; Colon polyps in her mother; Diabetes in her mother; Heart disease in her paternal aunt; Thalassemia in her maternal aunt, mother, and sister. Allergies  Allergen Reactions   Doxycycline Diarrhea and Nausea Only    Nausea and diarrhea started a few hours after taking medication.  Diarrhea and nausea continue 2 days after discontinuation.   Hydrocodone Bit-Homatrop Mbr Itching, Anxiety and Other (See Comments)    Made pt confused    Penicillins Shortness Of Breath and Swelling    TONGUE SWELLING Has patient had a PCN reaction causing immediate rash, facial/tongue/throat swelling, SOB or lightheadedness with hypotension: Unknown Has patient had a PCN reaction causing severe rash involving mucus membranes or skin necrosis: Unknown Has patient had a PCN reaction that required hospitalization: Was already in the hospital when happened Has patient had a PCN reaction occurring within the last 10 years: Yes able to take Augmentin If all of the above answers are "NO", then may proc   Promethazine Hcl Shortness Of Breath   Clarithromycin Nausea And Vomiting   Statins Other (See Comments)    LEG CRAMPS   Kiwi Extract Swelling    Review of Systems  Constitutional:  Negative for chills and fever.  HENT:  Positive for ear pain. Negative for congestion, hearing loss, sore throat and tinnitus.   Respiratory:  Positive for cough. Negative for hemoptysis, shortness of breath and wheezing.   Cardiovascular:  Negative for chest pain.      Objective:     BP 118/80 (BP Location: Left Arm, Patient Position: Sitting, Cuff Size: Large)   Pulse 85   Temp 98.1 F (36.7 C) (Oral)   Ht 5' 1"$  (1.549 m)   Wt 241 lb 11.2 oz (109.6 kg)   SpO2 98%   BMI 45.67 kg/m    Physical Exam Vitals reviewed.   Constitutional:      Appearance: Normal appearance.  HENT:     Head:     Comments: She has significant malalignment of the TMJ joint but no tenderness to palpation No parotid masses or obvious parotid swelling.    Right Ear: Tympanic membrane, ear canal and external ear normal.     Left Ear: Tympanic membrane, ear canal and external ear normal.     Mouth/Throat:     Mouth: Mucous membranes are moist.  Pharynx: Oropharynx is clear. No oropharyngeal exudate or posterior oropharyngeal erythema.  Cardiovascular:     Rate and Rhythm: Normal rate and regular rhythm.  Pulmonary:     Effort: Pulmonary effort is normal.     Breath sounds: Normal breath sounds.  Musculoskeletal:     Cervical back: Neck supple.  Lymphadenopathy:     Cervical: No cervical adenopathy.  Skin:    Findings: No rash.  Neurological:     Mental Status: She is alert.      No results found for any visits on 09/13/22.    The ASCVD Risk score (Arnett DK, et al., 2019) failed to calculate for the following reasons:   The patient has a prior MI or stroke diagnosis    Assessment & Plan:   Patient presents with 1 day history of right periauricular pain.  No TMJ tenderness oropharynx clear.  No evidence for dental process.  No evidence for otitis media or otitis externa.?  Eustachian tube dysfunction.  She has some dry cough and suspect this is lingering from her COVID infection.  Nonfocal exam.  No concerning symptoms such as dyspnea, fever, etc.  -She will try over-the-counter Coricidin. -Consider short-term only use of Advil or Aleve -Follow-up for any persistent or worsening pain or other new symptoms   Carolann Littler, MD

## 2022-09-13 NOTE — Patient Instructions (Signed)
Try over the counter Coricidin.   Consider short term trial of Aleve or Advil     Follow up for any fever or persistent pain.

## 2022-09-17 ENCOUNTER — Ambulatory Visit: Payer: BLUE CROSS/BLUE SHIELD | Admitting: Physician Assistant

## 2022-09-17 ENCOUNTER — Encounter: Payer: Self-pay | Admitting: Physician Assistant

## 2022-09-17 VITALS — BP 124/71 | HR 88 | Resp 18 | Ht 61.0 in | Wt 245.0 lb

## 2022-09-17 DIAGNOSIS — R413 Other amnesia: Secondary | ICD-10-CM

## 2022-09-17 NOTE — Progress Notes (Signed)
Assessment/Plan:   Memory Difficulties  [name] is a very pleasant 67 y.o. RH female  with a history of hypertension, hyperlipidemia, asthma, pre-diabetes, history of TIA 06/2003, fatty infiltration of the liver (followed by GI), endometrial cancer s/p resection 06/2021, CoVID 08/30/22, presenting today in follow-up for discussion of the results of the  MRI brain 08/03/23 at [facility] which  showed normal brain parenchyma and volume for age without atrophy, remote lacunar R basal ganglia infarcts, no acute findings.    Recommendations:   Follow up as needed  Continue B12 and Vit D supplements Recommend sleep study for possible sleep apnea  Recommend good control of cardiovascular risk factors, secondary stroke prevention .   No indication for antidementia medication at this time Recommend stress relieving exercises for stress.      Subjective:   This patient is here alone.  Previous records as well as any outside records available were reviewed prior to todays visit.   Patient was last seen on 07/30/22 at which time the MoCA was 27/30     Any changes in memory since last visit?  No changes, her main complaint is that she has difficulties completing a story, a decrease in her communication skills as before, which can be very frustrating    Initial Visit 07/30/22   How long did patient have memory difficulties? Overt the last 6-8 months.  She reports : " My communication skills have go down, sentences don't come out, on the phone I cannot complete a story, it is very frustrating. " I love music and I used to know every word of the "American Pie"  song, I cannot say 20 of those words". She is unsure if this was gradual or not. "Sometimes the word I know will not come out"  She does report some difficulties saying names of people, no issues with remembering them. She is able to remember recent conversations repeats oneself?  Denies  Disoriented when walking into a room?  Patient  denies   Leaving objects in unusual places?  Patient denies . "Prior to all this I was OCD, I have not been placing stuff in the proper place, like meds for ex.".   Wandering behavior?  Patient denies   Any personality changes since last visit?  "My husband and daughter say I am at my 66 year old level because of my words"   Any worsening depression?:  Patient denies   Hallucinations or paranoia?  Patient denies   Seizures?   Patient denies    Any sleep changes?  Denies  vivid dreams, REM behavior or sleepwalking . She has been referred for sleep study for snoring, and this is pending Sleep apnea?  Patient unsure Any hygiene concerns?  Patient denies   Independent of bathing and dressing?  Endorsed  Does the patient needs help with medications? Patient is in charge. " I would rather have my meds in the bottle at nightstand than on a pillbox, will lose them".   Who is in charge of the finances?  Patient  is in charge    Any changes in appetite?  Patient denies    Patient have trouble swallowing? Patient denies   Does the patient cook? Endorsed  Any kitchen accidents such as leaving the stove on? Patient denies   Any headaches?  Patient denies "They are days when stressed little needles in the R side of my head, take and Icepack and hold it there till goes away, comes and goes, hit or  miss, not frequent, the hair follicles hurt".  Chronic back pain Patient denies   Ambulates with difficulty?   Patient denies   Recent falls or head injuries?  Patient denies     Unilateral weakness, numbness or tingling?  Patient denies  . During her 2004 TIA "I was at a ball game and felt the blood coming out of my body, felt my tongue not moving".  Any tremors?  Patient denies   Any anosmia?  Patient denies   Any incontinence of urine?  Patient denies   Any bowel dysfunction?   Patient denies      Patient lives  husband  History of heavy alcohol intake?  Patient denies   History of heavy tobacco use?  Patient  denies   Family history of dementia?  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 due to her sister having bladder surgery and has to take care of her  Past Medical History:  Diagnosis Date   Arthritis    neck, knees, ankles, feet, back   Asthma, mild intermittent    prn inhaler   Cervical spondylosis    Chronic dyspnea    per pt gets winded w/ stairs, but recovers quickly   Diverticular disease of colon    followed by dr Collene Mares (GI)---  chronic w/ chronic diarrhea   Essential hypertension    Fatty infiltration of liver    followed by dr Collene Mares--- abd ultrasound in epic 08-31-2020   Frequency of urination    History of chest pain    evaluated by cardiologist-- dr [name],  11/ 2017 nuclear stress test suggest ischemia,  06/2016 cardiac cath , showed normal coronary anatomy and lvf   History of COVID-19    per pt in 2019   treated with presumed covid had  mild to moderate symptoms that resolved   History of diverticulitis of colon    05/ 2022   History of TIA (transient ischemic attack) 06/2003   Hyperlipidemia    Hypertension    Limitation of joint motion of neck    per pt due to bone spurs C2-5   Mild intermittent asthma    followed by pcp   PMB (postmenopausal bleeding)    Pre-diabetes    Pulmonary nodule    followed by pcp,  last hest CT in epic 04-21-2018   Stenosis of cervix    SUI (stress urinary incontinence, female)    Thickened endometrium    TMJ syndrome    Wears glasses      Past Surgical History:  Procedure Laterality Date   APPENDECTOMY  1979   BREAST ENHANCEMENT SURGERY Bilateral 1988   per pt implants removed 2002   BUNIONECTOMY Left 07/17/2007   @[facility]    BUNIONECTOMY Right 05/26/2014   Procedure: RIGHT FOOT LAPIDUS BUNION CORRECTION AMD MODIFIED MCBRIDE BUNIONECTOMY;  Surgeon: Wylene Simmer, MD;  Location: [place];   Service: Orthopedics;  Laterality: Right;   CARDIAC CATHETERIZATION N/A 06/28/2016   Procedure: Left Heart Cath and Coronary Angiography;  Surgeon: Peter M Martinique, MD;  Location: [facility];  Service: Cardiovascular;  Laterality: N/A;   CESAREAN SECTION  1986   COLONOSCOPY  12/27/2020   by dr Collene Mares   FINGER SURGERY Left 07/28/2015   @[facility]  :  left thumb "knuckle collasped"  arthroplasty   FOOT HARDWARE REMOVAL  04/25/2016   @[facility] ;   right foot   FOOT SURGERY Right 04/04/2015   @[facility] ;   first and second toe's   GANGLION CYST EXCISION Right 2016   wrist   HYSTEROSCOPY WITH D & C N/A 05/24/2021   Procedure: DILATATION AND CURETTAGE /HYSTEROSCOPY and MyoSure;  Surgeon: Armandina Stammer, DO;  Location: [place];  Service: Gynecology;  Laterality: N/A;   NASAL SEPTUM SURGERY  1981   ROTATOR CUFF REPAIR W/ DISTAL CLAVICLE EXCISION Left 08/18/2008   @[facility]$  by dr Shellia Carwin   SHOULDER ARTHROSCOPY W/ LABRAL REPAIR Left 06/07/2008   @[facility] ;  and SAD  by dr Shellia Carwin   TONSILLECTOMY  1964   TUBAL LIGATION Bilateral 1999     PREVIOUS MEDICATIONS:   CURRENT MEDICATIONS:  Outpatient Encounter Medications as of 09/17/2022  Medication Sig   albuterol (PROVENTIL) (2.5 MG/3ML) 0.083% nebulizer solution Take 3 mLs (2.5 mg total) by nebulization every 6 (six) hours as needed for wheezing or shortness of breath.   albuterol (VENTOLIN HFA) 108 (90 Base) MCG/ACT inhaler Inhale 1-2 puffs into the lungs every 4 (four) hours as needed for wheezing or shortness of breath.   benzonatate (TESSALON) 100 MG capsule Take 1 capsule (100 mg total) by mouth 3 (three) times daily as needed.   cetirizine (ZYRTEC) 10 MG tablet Take 1 tablet (10 mg total) by mouth at bedtime.   cyanocobalamin (VITAMIN B12) 1000 MCG tablet Take 1 tablet (1,000 mcg total) by mouth daily.   Evolocumab (REPATHA SURECLICK) XX123456 MG/ML SOAJ ADMINISTER 1 ML UNDER THE SKIN EVERY 14 DAYS   ezetimibe (ZETIA) 10 MG tablet TAKE 1  TABLET(10 MG) BY MOUTH DAILY   fluticasone (FLONASE) 50 MCG/ACT nasal spray Place 2 sprays into both nostrils 2 (two) times daily as needed.   fluticasone (FLOVENT HFA) 44 MCG/ACT inhaler Inhale 1 puff into the lungs daily.   losartan-hydrochlorothiazide (HYZAAR) 50-12.5 MG tablet Take 1 tablet by mouth daily.   montelukast (SINGULAIR) 10 MG tablet Take 1 tablet (10 mg total) by mouth at bedtime.   Vitamin D, Ergocalciferol, (DRISDOL) 1.25 MG (50000 UNIT) CAPS capsule Take 1 capsule (50,000 Units total) by mouth every 7 (seven) days.   No facility-administered encounter medications on file as of 09/17/2022.     Objective:     PHYSICAL EXAMINATION:    VITALS:   Vitals:   09/17/22 0853  BP: 124/71  Pulse: 88  Resp: 18  SpO2: 97%  Weight: 245 lb (111.1 kg)  Height: 5' 1"$  (1.549 m)         07/30/2022    8:00 AM  Montreal Cognitive Assessment   Visuospatial/ Executive (0/5) 4  Naming (0/3) 3  Attention: Read list of digits (0/2) 2  Attention: Read list of letters (0/1) 1  Attention: Serial 7 subtraction starting at 100 (0/3) 3  Language: Repeat phrase (0/2) 2  Language : Fluency (0/1) 0  Abstraction (0/2) 2  Delayed Recall (0/5) 4  Orientation (0/6) 6  Total 27  Adjusted Score (based on education) 27        No data to display          hesitate to contact us for any questions or concerns.   Total time spent on today's visit was 37 minutes dedicated to this patient today, preparing to see patient, examining the patient, ordering tests and/or medications and counseling the patient, documenting clinical information in the EHR or other health record, independently interpreting results and communicating results to the  patient/family, discussing treatment and goals, answering patient's questions and coordinating care.  Cc:  Hoyt Koch, MD  Sharene Butters 09/17/2022 5:08 PM

## 2022-10-01 ENCOUNTER — Encounter: Payer: Self-pay | Admitting: Internal Medicine

## 2022-10-01 ENCOUNTER — Ambulatory Visit (INDEPENDENT_AMBULATORY_CARE_PROVIDER_SITE_OTHER): Payer: BLUE CROSS/BLUE SHIELD

## 2022-10-01 ENCOUNTER — Ambulatory Visit: Payer: BLUE CROSS/BLUE SHIELD | Admitting: Internal Medicine

## 2022-10-01 VITALS — BP 130/84 | HR 79 | Temp 97.6°F | Resp 16 | Ht 61.0 in | Wt 237.0 lb

## 2022-10-01 DIAGNOSIS — N182 Chronic kidney disease, stage 2 (mild): Secondary | ICD-10-CM

## 2022-10-01 DIAGNOSIS — R5382 Chronic fatigue, unspecified: Secondary | ICD-10-CM | POA: Diagnosis not present

## 2022-10-01 DIAGNOSIS — R300 Dysuria: Secondary | ICD-10-CM | POA: Diagnosis not present

## 2022-10-01 DIAGNOSIS — R7303 Prediabetes: Secondary | ICD-10-CM

## 2022-10-01 DIAGNOSIS — A084 Viral intestinal infection, unspecified: Secondary | ICD-10-CM

## 2022-10-01 DIAGNOSIS — R10811 Right upper quadrant abdominal tenderness: Secondary | ICD-10-CM

## 2022-10-01 DIAGNOSIS — I10 Essential (primary) hypertension: Secondary | ICD-10-CM

## 2022-10-01 DIAGNOSIS — A09 Infectious gastroenteritis and colitis, unspecified: Secondary | ICD-10-CM | POA: Diagnosis not present

## 2022-10-01 DIAGNOSIS — E876 Hypokalemia: Secondary | ICD-10-CM

## 2022-10-01 DIAGNOSIS — A0472 Enterocolitis due to Clostridium difficile, not specified as recurrent: Secondary | ICD-10-CM

## 2022-10-01 LAB — CBC WITH DIFFERENTIAL/PLATELET
Basophils Absolute: 0.1 10*3/uL (ref 0.0–0.1)
Basophils Relative: 1.3 % (ref 0.0–3.0)
Eosinophils Absolute: 0.4 10*3/uL (ref 0.0–0.7)
Eosinophils Relative: 5 % (ref 0.0–5.0)
HCT: 43.1 % (ref 36.0–46.0)
Hemoglobin: 14.4 g/dL (ref 12.0–15.0)
Lymphocytes Relative: 29.2 % (ref 12.0–46.0)
Lymphs Abs: 2.2 10*3/uL (ref 0.7–4.0)
MCHC: 33.5 g/dL (ref 30.0–36.0)
MCV: 88.1 fl (ref 78.0–100.0)
Monocytes Absolute: 0.6 10*3/uL (ref 0.1–1.0)
Monocytes Relative: 8.4 % (ref 3.0–12.0)
Neutro Abs: 4.3 10*3/uL (ref 1.4–7.7)
Neutrophils Relative %: 56.1 % (ref 43.0–77.0)
Platelets: 276 10*3/uL (ref 150.0–400.0)
RBC: 4.9 Mil/uL (ref 3.87–5.11)
RDW: 13.9 % (ref 11.5–15.5)
WBC: 7.7 10*3/uL (ref 4.0–10.5)

## 2022-10-01 LAB — TSH: TSH: 1.63 u[IU]/mL (ref 0.35–5.50)

## 2022-10-01 LAB — HEPATIC FUNCTION PANEL
ALT: 20 U/L (ref 0–35)
AST: 17 U/L (ref 0–37)
Albumin: 4.6 g/dL (ref 3.5–5.2)
Alkaline Phosphatase: 90 U/L (ref 39–117)
Bilirubin, Direct: 0.1 mg/dL (ref 0.0–0.3)
Total Bilirubin: 0.6 mg/dL (ref 0.2–1.2)
Total Protein: 8.1 g/dL (ref 6.0–8.3)

## 2022-10-01 LAB — BASIC METABOLIC PANEL
BUN: 16 mg/dL (ref 6–23)
CO2: 31 mEq/L (ref 19–32)
Calcium: 10.5 mg/dL (ref 8.4–10.5)
Chloride: 97 mEq/L (ref 96–112)
Creatinine, Ser: 0.97 mg/dL (ref 0.40–1.20)
GFR: 61.81 mL/min (ref >60.00)
Glucose, Bld: 84 mg/dL (ref 70–99)
Potassium: 3.1 mEq/L — ABNORMAL LOW (ref 3.5–5.1)
Sodium: 137 mEq/L (ref 135–145)

## 2022-10-01 LAB — HEMOGLOBIN A1C: Hgb A1c MFr Bld: 5.8 % (ref 4.6–6.5)

## 2022-10-01 LAB — LIPASE: Lipase: 13 U/L (ref 11.0–59.0)

## 2022-10-01 MED ORDER — ONDANSETRON HCL 8 MG PO TABS: 8.0000 mg | ORAL_TABLET | Freq: Three times a day (TID) | ORAL | 1 refills | Status: DC | PRN

## 2022-10-01 NOTE — Progress Notes (Signed)
Subjective:  Patient ID: [name], female    DOB: 08-Jul-1956  Age: 67 y.o. MRN: [id number]  CC: Abdominal Pain   HPI [name] presents for f/up ----  She complains of a 5-day history of diffuse abdominal pain, watery diarrhea (3 times a day), metallic taste in her mouth, nausea, vomiting, fatigue, and shortness of breath.  She denies chest pain, cough, or hemoptysis.  Outpatient Medications Prior to Visit  Medication Sig Dispense Refill   albuterol (PROVENTIL) (2.5 MG/3ML) 0.083% nebulizer solution Take 3 mLs (2.5 mg total) by nebulization every 6 (six) hours as needed for wheezing or shortness of breath. 150 mL 1   albuterol (VENTOLIN HFA) 108 (90 Base) MCG/ACT inhaler Inhale 1-2 puffs into the lungs every 4 (four) hours as needed for wheezing or shortness of breath. 18 g 0   benzonatate (TESSALON) 100 MG capsule Take 1 capsule (100 mg total) by mouth 3 (three) times daily as needed. 30 capsule 0   cetirizine (ZYRTEC) 10 MG tablet Take 1 tablet (10 mg total) by mouth at bedtime. 90 tablet 3   cyanocobalamin (VITAMIN B12) 1000 MCG tablet Take 1 tablet (1,000 mcg total) by mouth daily. 90 tablet 3   Evolocumab (REPATHA SURECLICK) [id number] MG/ML SOAJ ADMINISTER 1 ML UNDER THE SKIN EVERY 14 DAYS 2 mL 11   ezetimibe (ZETIA) 10 MG tablet TAKE 1 TABLET(10 MG) BY MOUTH DAILY 90 tablet 3   fluticasone (FLONASE) 50 MCG/ACT nasal spray Place 2 sprays into both nostrils 2 (two) times daily as needed.  5   fluticasone (FLOVENT HFA) 44 MCG/ACT inhaler Inhale 1 puff into the lungs daily. 10.6 g 11   losartan-hydrochlorothiazide (HYZAAR) 50-12.5 MG tablet Take 1 tablet by mouth daily. 90 tablet 3   montelukast (SINGULAIR) 10 MG tablet Take 1 tablet (10 mg total) by mouth at bedtime. 90 tablet 3   Vitamin D, Ergocalciferol, (DRISDOL) 1.25 MG (50000 UNIT) CAPS capsule Take 1 capsule (50,000 Units total) by mouth every 7 (seven) days. 12 capsule 0   No facility-administered medications prior to  visit.    ROS Review of Systems  Constitutional:  Positive for chills and fatigue. Negative for appetite change, diaphoresis, fever and unexpected weight change.  HENT: Negative.  Negative for sore throat.   Eyes: Negative.   Respiratory:  Positive for cough and shortness of breath. Negative for chest tightness and wheezing.   Cardiovascular:  Negative for chest pain, palpitations and leg swelling.  Gastrointestinal:  Positive for abdominal pain, diarrhea, nausea and vomiting. Negative for abdominal distention, blood in stool and constipation.  Endocrine: Negative.  Negative for polyuria.  Genitourinary:  Positive for dysuria. Negative for difficulty urinating, flank pain, hematuria and urgency.  Musculoskeletal: Negative.  Negative for myalgias.  Skin: Negative.   Neurological: Negative.  Negative for dizziness, weakness and light-headedness.  Hematological:  Negative for adenopathy. Does not bruise/bleed easily.  Psychiatric/Behavioral: Negative.      Objective:  BP 130/84 (BP Location: Right Arm, Patient Position: Sitting, Cuff Size: Large)   Pulse 79   Temp 97.6 F (36.4 C) (Oral)   Resp 16   Ht '5\' 1"'$  (1.549 m)   Wt 237 lb (107.5 kg)   SpO2 97%   BMI 44.78 kg/m   BP Readings from Last 3 Encounters:  10/01/22 130/84  09/17/22 124/71  09/13/22 118/80    Wt Readings from Last 3 Encounters:  10/01/22 237 lb (107.5 kg)  09/17/22 245 lb (111.1 kg)  09/13/22 241 lb 11.2 oz (  109.6 kg)    Physical Exam Vitals reviewed.  Constitutional:      General: She is not in acute distress.    Appearance: She is not ill-appearing, toxic-appearing or diaphoretic.  HENT:     Nose: Nose normal.     Mouth/Throat:     Mouth: Mucous membranes are moist.  Eyes:     General: No scleral icterus.    Conjunctiva/sclera: Conjunctivae normal.  Cardiovascular:     Rate and Rhythm: Normal rate and regular rhythm.     Heart sounds: No murmur heard.    No friction rub. No gallop.      Comments: EKG --- NSR with SA, 80 bpm No Q waves or ST/T wave changes Pulmonary:     Effort: Pulmonary effort is normal.     Breath sounds: No stridor. No wheezing, rhonchi or rales.  Abdominal:     General: Abdomen is protuberant. Bowel sounds are decreased. There is no distension.     Palpations: Abdomen is soft. There is no hepatomegaly, splenomegaly or mass.     Tenderness: There is abdominal tenderness in the right upper quadrant, right lower quadrant, periumbilical area and suprapubic area. There is no right CVA tenderness, left CVA tenderness, guarding or rebound.     Hernia: No hernia is present.  Musculoskeletal:        General: Normal range of motion.     Cervical back: Neck supple.     Right lower leg: No edema.     Left lower leg: No edema.  Lymphadenopathy:     Cervical: No cervical adenopathy.  Skin:    General: Skin is warm and dry.     Coloration: Skin is not pale.  Neurological:     General: No focal deficit present.     Mental Status: She is alert.  Psychiatric:        Mood and Affect: Mood normal.        Behavior: Behavior normal.     Lab Results  Component Value Date   WBC 7.7 10/01/2022   HGB 14.4 10/01/2022   HCT 43.1 10/01/2022   PLT 276.0 10/01/2022   GLUCOSE 84 10/01/2022   CHOL 135 11/23/2021   TRIG 171 (H) 11/23/2021   HDL 45 11/23/2021   LDLDIRECT 78.0 03/01/2019   LDLCALC 61 11/23/2021   ALT 20 10/01/2022   AST 17 10/01/2022   NA 137 10/01/2022   K 3.1 (L) 10/01/2022   CL 97 10/01/2022   CREATININE 0.97 10/01/2022   BUN 16 10/01/2022   CO2 31 10/01/2022   TSH 1.63 10/01/2022   INR 1.0 07/03/2021   HGBA1C 5.8 10/01/2022   MICROALBUR 0.9 11/26/2007    DG Chest 2 View  Result Date: 07/25/2022 CLINICAL DATA:  Cough and shortness of breath for 6 days. EXAM: CHEST - 2 VIEW COMPARISON:  July 03, 2021 FINDINGS: The heart size and mediastinal contours are within normal limits. Both lungs are clear. The visualized skeletal structures  are unremarkable. IMPRESSION: No active cardiopulmonary disease. Electronically Signed   By: Abelardo Diesel M.D.   On: 07/25/2022 10:52    No results found.   Assessment & Plan:   [name] was seen today for abdominal pain.  Diagnoses and all orders for this visit:  Right upper quadrant abdominal tenderness without rebound tenderness- Exam and x-ray are reassuring.  I do like she has an acute abdominal process.  Her labs are normal with the exception of a low K+ and positive C. difficile.  Will treat with vancomycin. -     Lipase; Future -     Urinalysis, Routine w reflex microscopic; Future -     Hepatic function panel; Future -     CBC with Differential/Platelet; Future -     DG ABD ACUTE 2+V W 1V CHEST; Future -     CBC with Differential/Platelet -     Hepatic function panel -     Urinalysis, Routine w reflex microscopic -     Lipase  Diarrhea of infectious origin -     CBC with Differential/Platelet; Future -     GI Profile, Stool, PCR; Future -     GI Profile, Stool, PCR -     CBC with Differential/Platelet  Dysuria -     Urinalysis, Routine w reflex microscopic; Future -     CULTURE, URINE COMPREHENSIVE; Future -     CULTURE, URINE COMPREHENSIVE -     Urinalysis, Routine w reflex microscopic  Chronic fatigue-EKG is reassuring and labs are negative for secondary causes. -     Basic metabolic panel; Future -     TSH; Future -     Hepatic function panel; Future -     CBC with Differential/Platelet; Future -     CBC with Differential/Platelet -     Hepatic function panel -     TSH -     Basic metabolic panel -     EKG [id number]  Prediabetes -     Basic metabolic panel; Future -     Hemoglobin A1c; Future -     Hemoglobin A1c -     Basic metabolic panel  Gastroenteritis and colitis, viral -     ondansetron (ZOFRAN) 8 MG tablet; Take 1 tablet (8 mg total) by mouth every 8 (eight) hours as needed for nausea or vomiting.  Essential hypertension  Chronic renal  disease, stage 2, mildly decreased glomerular filtration rate (GFR) between 60-89 mL/min/1.73 square meter  Hypokalemia due to loss of potassium -     potassium chloride (KLOR-CON 10) 10 MEQ tablet; Take 1 tablet (10 mEq total) by mouth 3 (three) times daily.  C. difficile enteritis -     vancomycin (VANCOCIN) 125 MG capsule; Take 1 capsule (125 mg total) by mouth 4 (four) times daily for 10 days.   I am having [name] start on ondansetron, potassium chloride, and vancomycin. I am also having her maintain her fluticasone, cetirizine, montelukast, fluticasone, losartan-hydrochlorothiazide, Vitamin D (Ergocalciferol), cyanocobalamin, Repatha SureClick, ezetimibe, albuterol, benzonatate, and albuterol.  Meds ordered this encounter  Medications   ondansetron (ZOFRAN) 8 MG tablet    Sig: Take 1 tablet (8 mg total) by mouth every 8 (eight) hours as needed for nausea or vomiting.    Dispense:  35 tablet    Refill:  1   potassium chloride (KLOR-CON 10) 10 MEQ tablet    Sig: Take 1 tablet (10 mEq total) by mouth 3 (three) times daily.    Dispense:  90 tablet    Refill:  0   vancomycin (VANCOCIN) 125 MG capsule    Sig: Take 1 capsule (125 mg total) by mouth 4 (four) times daily for 10 days.    Dispense:  40 capsule    Refill:  0     Follow-up: Return if symptoms worsen or fail to improve.  Scarlette Calico, MD

## 2022-10-01 NOTE — Patient Instructions (Signed)

## 2022-10-02 ENCOUNTER — Other Ambulatory Visit: Payer: Self-pay | Admitting: Internal Medicine

## 2022-10-02 ENCOUNTER — Encounter: Payer: Self-pay | Admitting: Internal Medicine

## 2022-10-02 DIAGNOSIS — N182 Chronic kidney disease, stage 2 (mild): Secondary | ICD-10-CM | POA: Insufficient documentation

## 2022-10-02 DIAGNOSIS — E876 Hypokalemia: Secondary | ICD-10-CM | POA: Insufficient documentation

## 2022-10-02 DIAGNOSIS — A084 Viral intestinal infection, unspecified: Secondary | ICD-10-CM | POA: Insufficient documentation

## 2022-10-02 LAB — URINALYSIS, ROUTINE W REFLEX MICROSCOPIC
Bilirubin Urine: NEGATIVE
Hgb urine dipstick: NEGATIVE
Ketones, ur: NEGATIVE
Leukocytes,Ua: NEGATIVE
Nitrite: NEGATIVE
RBC / HPF: NONE SEEN (ref 0–?)
Specific Gravity, Urine: 1.015 (ref 1.000–1.030)
Total Protein, Urine: NEGATIVE
Urine Glucose: NEGATIVE
Urobilinogen, UA: 0.2 (ref 0.0–1.0)
pH: 6 (ref 5.0–8.0)

## 2022-10-02 MED ORDER — POTASSIUM CHLORIDE ER 10 MEQ PO TBCR: 10.0000 meq | EXTENDED_RELEASE_TABLET | Freq: Three times a day (TID) | ORAL | 0 refills | Status: DC

## 2022-10-03 LAB — CULTURE, URINE COMPREHENSIVE

## 2022-10-04 DIAGNOSIS — A0472 Enterocolitis due to Clostridium difficile, not specified as recurrent: Secondary | ICD-10-CM | POA: Insufficient documentation

## 2022-10-04 DIAGNOSIS — Z9189 Other specified personal risk factors, not elsewhere classified: Secondary | ICD-10-CM | POA: Insufficient documentation

## 2022-10-04 LAB — GI PROFILE, STOOL, PCR

## 2022-10-04 MED ORDER — VANCOMYCIN HCL 125 MG PO CAPS: 125.0000 mg | ORAL_CAPSULE | Freq: Four times a day (QID) | ORAL | 0 refills | Status: AC

## 2022-10-04 NOTE — Telephone Encounter (Signed)
Pt stated that she is still vomiting and not able to hold any food down. She stated that she is feeling worse than what she was during her OV. She is not experiencing back pain and lower abdominal pain. During out phone call she was in route to pick up Abx.   Per verbal conversation with Dr. Ronnald Ramp. He stated that pt should start feeling better over the weekend with abx treatment. However, he stated that if she gets worse while taking abx she should present to the ED.   Pt has been informed and expressed understanding.

## 2022-10-07 ENCOUNTER — Encounter: Payer: Self-pay | Admitting: Internal Medicine

## 2022-10-07 ENCOUNTER — Other Ambulatory Visit: Payer: Self-pay | Admitting: Internal Medicine

## 2022-10-08 ENCOUNTER — Other Ambulatory Visit: Payer: Self-pay | Admitting: Cardiology

## 2022-10-08 DIAGNOSIS — I10 Essential (primary) hypertension: Secondary | ICD-10-CM

## 2022-10-14 ENCOUNTER — Ambulatory Visit: Payer: BLUE CROSS/BLUE SHIELD | Admitting: Internal Medicine

## 2022-10-18 LAB — HM MAMMOGRAPHY

## 2022-10-25 ENCOUNTER — Telehealth: Payer: Self-pay

## 2022-10-25 ENCOUNTER — Encounter: Payer: Self-pay | Admitting: Family Medicine

## 2022-10-25 ENCOUNTER — Ambulatory Visit: Payer: BLUE CROSS/BLUE SHIELD | Admitting: Family Medicine

## 2022-10-25 VITALS — BP 126/84 | HR 82 | Temp 97.6°F | Ht 61.0 in | Wt 241.0 lb

## 2022-10-25 DIAGNOSIS — J4520 Mild intermittent asthma, uncomplicated: Secondary | ICD-10-CM

## 2022-10-25 DIAGNOSIS — R0981 Nasal congestion: Secondary | ICD-10-CM | POA: Diagnosis not present

## 2022-10-25 DIAGNOSIS — J40 Bronchitis, not specified as acute or chronic: Secondary | ICD-10-CM

## 2022-10-25 DIAGNOSIS — J3489 Other specified disorders of nose and nasal sinuses: Secondary | ICD-10-CM | POA: Diagnosis not present

## 2022-10-25 DIAGNOSIS — R062 Wheezing: Secondary | ICD-10-CM

## 2022-10-25 MED ORDER — PREDNISONE 20 MG PO TABS: 40.0000 mg | ORAL_TABLET | Freq: Every day | ORAL | 0 refills | Status: DC

## 2022-10-25 NOTE — Telephone Encounter (Signed)
As patient was leaving acute visit she wanted to see if it would be ok with Dr. Sharlet Salina for her to schedule a B12 injection as she has been feeling very fatigued still. She reports Dr. Sharlet Salina told her that if she still wasn't feeling better on the supplements she could come in for the shots. Please advise as I only see note on 07/05/22 labs were you told her "The B12 level is also low normal so I have sent in oral replacement to take daily. We can also do B12 shots every 2 weeks for 2 months to see if this helps faster." But it is past the 2 months. If ok, I can call and get her scheduled.

## 2022-10-25 NOTE — Progress Notes (Signed)
Subjective:  [name] is a 67 y.o. female who presents for a 5 day hx of rhinorrhea, nasal congestion, cough and wheezing.  She has underlying asthma.  Denies fever, chills, body aches, chest pain, abdominal pain, N/V.   Dr. Collene Mares is her GI and sees her for recent C-diff 4-5 weeks ago.     No other aggravating or relieving factors.  No other c/o.  ROS as in subjective.  Objective: Vitals:   10/25/22 1457  BP: 126/84  Pulse: 82  Temp: 97.6 F (36.4 C)  SpO2: 99%    General appearance: Alert, WD/WN, no distress, mildly ill appearing                             Skin: warm, no rash                           Head: no sinus tenderness                            Eyes: conjunctiva normal, corneas clear, PERRLA                            Ears: pearly TMs, external ear canals normal                          Nose: septum midline, turbinates swollen, with erythema and clear discharge             Mouth/throat: MMM, tongue normal, mild pharyngeal erythema                           Neck: supple, no adenopathy, no thyromegaly, nontender                          Heart: RRR                         Lungs: CTA bilaterally, no wheezes, rales, or rhonchi      Assessment: Bronchitis - Plan: predniSONE (DELTASONE) 20 MG tablet  Mild intermittent asthma without complication  Nasal congestion with rhinorrhea  Wheezing   Plan: Discussed diagnosis and treatment of viral bronchitis, asthma.   Oral prednisone prescribed.  She will continue with allergy and asthma treatment.  Follow-up if not improving by Monday or Tuesday and I will send in a Z-Pak.  Encouraged her to avoid antibiotics unless absolutely necessary due to recent C. difficile diagnosis.  She will continue with probiotics.  Followed by Dr. Collene Mares for GI issues.

## 2022-10-25 NOTE — Telephone Encounter (Signed)
Called and scheduled B12 injection for Monday

## 2022-10-25 NOTE — Patient Instructions (Addendum)
   Take the oral prednisone by mouth starting tomorrow morning with breakfast.  If you start it this evening then be aware it can keep you awake.  Drink plenty of water.  Continue treating your allergies with Zyrtec and you may increase this to twice daily for now. Continue Flonase.  Use your inhaler as needed.  If you are not improving by Monday, let us know and we will send in a Z-Pak.

## 2022-10-25 NOTE — Telephone Encounter (Signed)
Fine to schedule B12

## 2022-10-28 ENCOUNTER — Ambulatory Visit (INDEPENDENT_AMBULATORY_CARE_PROVIDER_SITE_OTHER): Payer: BLUE CROSS/BLUE SHIELD | Admitting: Radiology

## 2022-10-28 DIAGNOSIS — E538 Deficiency of other specified B group vitamins: Secondary | ICD-10-CM | POA: Diagnosis not present

## 2022-10-28 MED ORDER — CYANOCOBALAMIN 1000 MCG/ML IJ SOLN
1000.0000 ug | Freq: Once | INTRAMUSCULAR | Status: AC
  Administered 2022-10-28: 1000 ug via INTRAMUSCULAR

## 2022-10-28 NOTE — Progress Notes (Signed)
Patient here for b12 injection and tolerated it well

## 2022-11-13 ENCOUNTER — Ambulatory Visit: Payer: BLUE CROSS/BLUE SHIELD | Admitting: Nurse Practitioner

## 2022-11-13 ENCOUNTER — Encounter: Payer: Self-pay | Admitting: Nurse Practitioner

## 2022-11-13 ENCOUNTER — Ambulatory Visit: Payer: BLUE CROSS/BLUE SHIELD

## 2022-11-13 VITALS — BP 128/80 | HR 65 | Temp 98.2°F | Ht 61.0 in | Wt 240.8 lb

## 2022-11-13 DIAGNOSIS — J0140 Acute pansinusitis, unspecified: Secondary | ICD-10-CM | POA: Diagnosis not present

## 2022-11-13 DIAGNOSIS — E538 Deficiency of other specified B group vitamins: Secondary | ICD-10-CM | POA: Diagnosis not present

## 2022-11-13 DIAGNOSIS — R053 Chronic cough: Secondary | ICD-10-CM | POA: Diagnosis not present

## 2022-11-13 DIAGNOSIS — J4521 Mild intermittent asthma with (acute) exacerbation: Secondary | ICD-10-CM

## 2022-11-13 MED ORDER — PREDNISONE 10 MG PO TABS
ORAL_TABLET | ORAL | 0 refills | Status: DC
Start: 2022-11-13 — End: 2022-12-04

## 2022-11-13 MED ORDER — CIPROFLOXACIN HCL 500 MG PO TABS: 500.0000 mg | ORAL_TABLET | Freq: Two times a day (BID) | ORAL | 0 refills | Status: AC

## 2022-11-13 MED ORDER — CYANOCOBALAMIN 1000 MCG/ML IJ SOLN
1000.0000 ug | Freq: Once | INTRAMUSCULAR | Status: AC
Start: 2022-11-13 — End: 2022-11-13
  Administered 2022-11-13: 1000 ug via INTRAMUSCULAR

## 2022-11-13 NOTE — Patient Instructions (Signed)
It was great to see you!  Start cipro twice a day for 7 days with food  Continue taking the probiotic with the antibiotic.   Start prednisone taper, 6 tablets today, 5 tomorrow, 4 the next day, then keep decreasing by 1 every day until gone. Take this in the morning with food.   Keep taking mucinex at bedtime.   I put in a referral to pulmonology   Let's follow-up if your symptoms worsen or don't improve.   Take care,  Rodman Pickle, NP

## 2022-11-13 NOTE — Progress Notes (Signed)
Acute Office Visit  Subjective:     Patient ID: [name], female    DOB: 07-16-1956, 67 y.o.   MRN: [id number]  Chief Complaint  Patient presents with   Head Congestion    Productive cough and chest congestion, cough, referral for pulmonologist, request to get B12 injection here    HPI Patient is in today for cough and chest congestion. She states that she has been sick since August 16, 2022 since having covid-19. She states that she is still having a chronic cough. She states that she will cough up yellow sputum that has a metallic taste. She was treated with antivirals for covid-19. She has been seen several times for this since January. She does have a history of asthma. She uses an albuterol inhaler as needed and a flovent inhaler. She endorses nasal congestion, sinus pressure, and a fever 3 days ago. She has been experiencing itching in her right ear.   She is also scheduled for a B12 injection at her PCP office but is asking if she can get it here instead.   ROS See pertinent positives and negatives per HPI.     Objective:    BP 128/80 (BP Location: Right Arm)   Pulse 65   Temp 98.2 F (36.8 C)   Ht 5\' 1"  (1.549 m)   Wt 240 lb 12.8 oz (109.2 kg)   SpO2 99%   BMI 45.50 kg/m     Physical Exam Vitals and nursing note reviewed.  Constitutional:      General: She is not in acute distress.    Appearance: Normal appearance.  HENT:     Head: Normocephalic.     Right Ear: Tympanic membrane, ear canal and external ear normal.     Left Ear: Tympanic membrane, ear canal and external ear normal.     Nose:     Right Sinus: Maxillary sinus tenderness and frontal sinus tenderness present.     Left Sinus: Maxillary sinus tenderness and frontal sinus tenderness present.     Mouth/Throat:     Pharynx: Posterior oropharyngeal erythema present. No oropharyngeal exudate.  Eyes:     Conjunctiva/sclera: Conjunctivae normal.  Cardiovascular:     Rate and Rhythm: Normal rate  and regular rhythm.     Pulses: Normal pulses.     Heart sounds: Normal heart sounds.  Pulmonary:     Effort: Pulmonary effort is normal.     Breath sounds: Normal breath sounds.  Musculoskeletal:     Cervical back: Normal range of motion and neck supple. Tenderness present.  Lymphadenopathy:     Cervical: No cervical adenopathy.  Skin:    General: Skin is warm.  Neurological:     General: No focal deficit present.     Mental Status: She is alert and oriented to person, place, and time.  Psychiatric:        Mood and Affect: Mood normal.        Behavior: Behavior normal.        Thought Content: Thought content normal.        Judgment: Judgment normal.     No results found for any visits on 11/13/22.      Assessment & Plan:   Problem List Items Addressed This Visit       Respiratory   Asthma    Chronic, exacerbated with recent respiratory illness.  She had COVID 19 in January and since then she has been having an ongoing cough.  Continue Flovent 44  mcg daily, Singulair 10 mg daily, and albuterol inhaler as needed.  Prednisone taper sent to the pharmacy.  Referral placed to pulmonology.      Relevant Medications   predniSONE (DELTASONE) 10 MG tablet   Other Relevant Orders   Ambulatory referral to Pulmonology   Other Visit Diagnoses     Acute non-recurrent pansinusitis    -  Primary   Treat with cipro 500mg  BID x7 days. Continue OTC medications and singulair. Encourage fluids and take probiotic with recent c-diff.   Relevant Medications   ciprofloxacin (CIPRO) 500 MG tablet   predniSONE (DELTASONE) 10 MG tablet   Chronic cough       Ongoing since having covid-19 in January. Teating with cipro and predisone taper for sinus infection. Referral placed to pulmonology.   Relevant Orders   Ambulatory referral to Pulmonology   Vitamin B12 deficiency       B12 injection given today as was scheduled at PCP office later this afternoon.   Relevant Medications    cyanocobalamin (VITAMIN B12) injection 1,000 mcg (Completed)       Meds ordered this encounter  Medications   ciprofloxacin (CIPRO) 500 MG tablet    Sig: Take 1 tablet (500 mg total) by mouth 2 (two) times daily for 7 days.    Dispense:  14 tablet    Refill:  0   predniSONE (DELTASONE) 10 MG tablet    Sig: Take 6 tablets today, then 5 tablets tomorrow, then decrease by 1 tablet every day until gone    Dispense:  21 tablet    Refill:  0   cyanocobalamin (VITAMIN B12) injection 1,000 mcg    Return if symptoms worsen or fail to improve.  Gerre Scull, NP

## 2022-11-13 NOTE — Assessment & Plan Note (Signed)
Chronic, exacerbated with recent respiratory illness.  She had COVID 19 in January and since then she has been having an ongoing cough.  Continue Flovent 44 mcg daily, Singulair 10 mg daily, and albuterol inhaler as needed.  Prednisone taper sent to the pharmacy.  Referral placed to pulmonology.

## 2022-12-03 NOTE — Progress Notes (Unsigned)
Mackinley Cassaday, female    DOB: 05-30-1956   MRN: [id number]   Brief patient profile:  66 yowf quit smoking 2007 @ 128 lbs   referred to pulmonary clinic 12/04/2022 by Rodman Pickle  for cough post covid Jan 2024 rx malnupvir with diahhrea and cough ever since.   Has h/o allergy/asthma as child eval by [facility] in [place] office but never on shots    Prior to covid maint singulair/ zyrtec/ hfa saba qd or qod/ flovent bid and twice a year prednisone on avg    History of Present Illness  12/04/2022  Pulmonary/ 1st office eval/Edwyna Dangerfield on Flovent/ prn saba  Chief Complaint  Patient presents with   Consult    Chronic cough x 4 mths., yellow thick,sob, wheezing.Had covid 4 mths. Ago been bad since.  Feels holding ground since last prednisone / residual congestion in chest and sinuses  Dyspnea:  not much change on MB uphill back to house can do without stopping but really sob  - note wt 226 before covid 245  Cough: yellow thick esp in am and after supper assoc with nasal congestion and pnds  Sleep: cough wakes her up/ 30 degrees hob  SABA use: twice daily   No obvious additional day to day or daytime patterns or assoc  mucus plugs or hemoptysis or cp or chest tightness, subjective wheeze or overt  hb symptoms.    Also denies any obvious fluctuation of symptoms with weather or environmental changes or other aggravating or alleviating factors except as outlined above   No unusual exposure hx or h/o childhood pna/ asthma or knowledge of premature birth.  Current Allergies, Complete Past Medical History, Past Surgical History, Family History, and Social History were reviewed in [employer] record.  ROS  The following are not active complaints unless bolded Hoarseness, sore throat, dysphagia, dental problems, itching, sneezing,  nasal congestion or discharge of excess mucus or purulent secretions, ear ache,   fever, chills, sweats, unintended wt loss or wt gain,  classically pleuritic or exertional cp,  orthopnea pnd or arm/hand swelling  or leg swelling, presyncope, palpitations, abdominal pain, anorexia, nausea, vomiting, diarrhea  or change in bowel habits or change in bladder habits, change in stools or change in urine, dysuria, hematuria,  rash, arthralgias, visual complaints, headache, numbness, weakness or ataxia or problems with walking or coordination,  change in mood or  memory.           Past Medical History:  Diagnosis Date   Arthritis    neck, knees, ankles, feet, back   Asthma, mild intermittent    prn inhaler   Cervical spondylosis    Chronic dyspnea    per pt gets winded w/ stairs, but recovers quickly   Diverticular disease of colon    followed by dr Loreta Ave (GI)---  chronic w/ chronic diarrhea   Essential hypertension    Fatty infiltration of liver    followed by dr Loreta Ave--- abd ultrasound in epic 08-31-2020   Frequency of urination    History of chest pain    evaluated by cardiologist-- dr Jens Som,  11/ 2017 nuclear stress test suggest ischemia,  06/2016 cardiac cath , showed normal coronary anatomy and lvf   History of COVID-19    per pt in 2019   treated with presumed covid had  mild to moderate symptoms that resolved   History of diverticulitis of colon    05/ 2022   History of TIA (transient ischemic attack) 06/2003  Hyperlipidemia    Hypertension    Limitation of joint motion of neck    per pt due to bone spurs C2-5   Mild intermittent asthma    followed by pcp   PMB (postmenopausal bleeding)    Pre-diabetes    Pulmonary nodule    followed by pcp,  last hest CT in epic 04-21-2018   Stenosis of cervix    SUI (stress urinary incontinence, female)    Thickened endometrium    TMJ syndrome    Wears glasses     Outpatient Medications Prior to Visit  Medication Sig Dispense Refill   albuterol (PROVENTIL) (2.5 MG/3ML) 0.083% nebulizer solution Take 3 mLs (2.5 mg total) by nebulization every 6 (six) hours as needed  for wheezing or shortness of breath. 150 mL 1   albuterol (VENTOLIN HFA) 108 (90 Base) MCG/ACT inhaler Inhale 1-2 puffs into the lungs every 4 (four) hours as needed for wheezing or shortness of breath. 18 g 0   cetirizine (ZYRTEC) 10 MG tablet Take 1 tablet (10 mg total) by mouth at bedtime. 90 tablet 3   cyanocobalamin (VITAMIN B12) 1000 MCG tablet Take 1 tablet (1,000 mcg total) by mouth daily. 90 tablet 3   Evolocumab (REPATHA SURECLICK) 140 MG/ML SOAJ ADMINISTER 1 ML UNDER THE SKIN EVERY 14 DAYS 2 mL 11   ezetimibe (ZETIA) 10 MG tablet TAKE 1 TABLET(10 MG) BY MOUTH DAILY 90 tablet 3   famotidine (PEPCID) 40 MG tablet Take 40 mg by mouth 2 (two) times daily.     fluticasone (FLONASE) 50 MCG/ACT nasal spray Place 2 sprays into both nostrils 2 (two) times daily as needed.  5   fluticasone (FLOVENT HFA) 44 MCG/ACT inhaler Inhale 1 puff into the lungs daily. 10.6 g 11   losartan-hydrochlorothiazide (HYZAAR) 50-12.5 MG tablet TAKE 1 TABLET BY MOUTH DAILY 90 tablet 2   montelukast (SINGULAIR) 10 MG tablet Take 1 tablet (10 mg total) by mouth at bedtime. 90 tablet 3   potassium chloride (KLOR-CON 10) 10 MEQ tablet Take 1 tablet (10 mEq total) by mouth 3 (three) times daily. 90 tablet 0   Vitamin D, Ergocalciferol, (DRISDOL) 1.25 MG (50000 UNIT) CAPS capsule Take 1 capsule (50,000 Units total) by mouth every 7 (seven) days. 12 capsule 0   ondansetron (ZOFRAN) 8 MG tablet Take 1 tablet (8 mg total) by mouth every 8 (eight) hours as needed for nausea or vomiting. (Patient not taking: Reported on 11/13/2022) 35 tablet 1   predniSONE (DELTASONE) 10 MG tablet Take 6 tablets today, then 5 tablets tomorrow, then decrease by 1 tablet every day until gone (Patient not taking: Reported on 12/04/2022) 21 tablet 0   No facility-administered medications prior to visit.     Objective:     BP 116/72 (BP Location: Left Arm, Cuff Size: Large)   Pulse 88   Temp 98.5 F (36.9 C) (Temporal)   Ht 5\' 3"  (1.6 m)   Wt  245 lb (111.1 kg)   SpO2 98% Comment: RA  BMI 43.40 kg/m   SpO2: 98 % (RA) amb wf coughs at end of  insp   HEENT : Oropharynx  clear      Nasal turbinates mod edema s polps or discharge   NECK :  without  apparent JVD/ palpable Nodes/TM    LUNGS: no acc muscle use,  Nl contour chest which is clear to A and P bilaterally without cough on insp or exp maneuvers   CV:  RRR  no s3  or murmur or increase in P2, and no edema   ABD:  soft and nontender with nl inspiratory excursion in the supine position. No bruits or organomegaly appreciated   MS:  Nl gait/ ext warm without deformities Or obvious joint restrictions  calf tenderness, cyanosis or clubbing    SKIN: warm and dry without lesions    NEURO:  alert, approp, nl sensorium with  no motor or cerebellar deficits apparent.    CXR PA and Lateral:   12/04/2022 :    I personally reviewed images and impression is as follows:   No air space dz or acute findings        Assessment   Cough variant asthma Onset with covid Jan 2024  -  12/04/2022  After extensive coaching inhaler device,  effectiveness =    75% > try symbicort 80 1-2 bid  - Sinus CT ordered 12/04/2022 / added 1st gen H1 blockers per guidelines    Ddx is cough variant asthma vs Upper airway cough syndrome (previously labeled PNDS),  is so named because it's frequently impossible to sort out how much is  CR/sinusitis with freq throat clearing (which can be related to primary GERD)   vs  causing  secondary (" extra esophageal")  GERD from wide swings in gastric pressure that occur with throat clearing, often  promoting self use of mint and menthol lozenges that reduce the lower esophageal sphincter tone and exacerbate the problem further in a cyclical fashion.   These are the same pts (now being labeled as having "irritable larynx syndrome" by some cough centers) who not infrequently have a history of having failed to tolerate ace inhibitors,  dry powder inhalers or biphosphonates  or report having atypical/extraesophageal reflux symptoms that don't respond to standard doses of PPI  and are easily confused as having aecopd or asthma flares by even experienced allergists/ pulmonologists (myself included).    Rec  Rx as cough variant asthma with symbicort 80 and the UACS as follows:  Of the three most common causes of  Sub-acute / recurrent or chronic cough, only one (GERD)  can actually contribute to/ trigger  the other two (asthma and post nasal drip syndrome)  and perpetuate the cylce of cough.  While not intuitively obvious, many patients with chronic low grade reflux do not cough until there is a primary insult that disturbs the protective epithelial barrier and exposes sensitive nerve endings.   This is typically viral but can due to PNDS and  either may apply here.     The point is that once this occurs, it is difficult to eliminate the cycle  using anything but a maximally effective acid suppression regimen at least in the short run, accompanied by an appropriate diet to address non acid GERD and control / eliminate the pnds with 1st gen H1 blockers per guidelines  cough itself for at least 3 days with tramadol up to 100 mg every 4 hours if needed   Added 1st gen H1 blockers per guidelines  pending sinus ct (would avoid abx here given problems with diarrhea/ c diff chronically) and  >>> also  depomedrol 120 mg IM in  case of component of Th-2 driven upper or lower airways inflammation (if cough responds short term only to relapse before return while will on full rx for uacs (as above), then  that would point to allergic rhinitis/ asthma or eos bronchitis as alternative dx)          Each maintenance  medication was reviewed in detail including emphasizing most importantly the difference between maintenance and prns and under what circumstances the prns are to be triggered using an action plan format where appropriate.  Total time for H and P, chart review, counseling,  reviewing hfa  device(s) and generating customized AVS unique to this office visit / same day charting  > 60 min new pt eval          Sandrea Hughs, MD 12/04/2022

## 2022-12-04 ENCOUNTER — Ambulatory Visit: Payer: BLUE CROSS/BLUE SHIELD | Admitting: Internal Medicine

## 2022-12-04 ENCOUNTER — Ambulatory Visit (INDEPENDENT_AMBULATORY_CARE_PROVIDER_SITE_OTHER): Payer: BLUE CROSS/BLUE SHIELD

## 2022-12-04 ENCOUNTER — Encounter: Payer: Self-pay | Admitting: Internal Medicine

## 2022-12-04 VITALS — BP 116/72 | HR 88 | Temp 98.5°F | Ht 63.0 in | Wt 245.0 lb

## 2022-12-04 DIAGNOSIS — J45991 Cough variant asthma: Secondary | ICD-10-CM

## 2022-12-04 DIAGNOSIS — J340 Abscess, furuncle and carbuncle of nose: Secondary | ICD-10-CM

## 2022-12-04 MED ORDER — BUDESONIDE-FORMOTEROL FUMARATE 80-4.5 MCG/ACT IN AERO
INHALATION_SPRAY | RESPIRATORY_TRACT | 12 refills | Status: DC
Start: 2022-12-04 — End: 2024-03-03

## 2022-12-04 MED ORDER — METHYLPREDNISOLONE ACETATE 80 MG/ML IJ SUSP
120.0000 mg | Freq: Once | INTRAMUSCULAR | Status: AC
Start: 2022-12-04 — End: 2022-12-04
  Administered 2022-12-04: 120 mg via INTRAMUSCULAR

## 2022-12-04 NOTE — Patient Instructions (Addendum)
My office will be contacting you by phone for referral to sinus CT   - if you don't hear back from my office within one week please call us back or notify us thru MyChart and we'll address it right away.   Best cough med >  Mucinex dm 1200 mg twice daily and supplement with tramadol 50 mg 1-2 every 4 hours as needed   For breathing problems as needed > albuterol up to every 4 hours in the inhaler or nebulizer   Pepcid 40 mg after bfast and supper   Stop flovent and start symbicort 80 Take 2 puffs first thing in am and then another 2 puffs about 12 hours later - if makes you cough or bothers your throat just use one every 12 hours.  Work on inhaler technique:  relax and gently blow all the way out then take a nice smooth full deep breath back in, triggering the inhaler at same time you start breathing in.  Hold breath in for at least  5 seconds if you can. Blow out symbicort out thru nose. Rinse and gargle with water when done.  If mouth or throat bother you at all,  try brushing teeth/gums/tongue with arm and hammer toothpaste/ make a slurry and gargle and spit out.   Depomedrol 120 mg IM   GERD (REFLUX)  is an extremely common cause of respiratory symptoms just like yours , many times with no obvious heartburn at all.    It can be treated with medication, but also with lifestyle changes including elevation of the head of your bed (ideally with 6 -8inch blocks under the headboard of your bed),  Smoking cessation, avoidance of late meals, excessive alcohol, and avoid fatty foods, chocolate, peppermint, colas, red wine, and acidic juices such as orange juice.  NO MINT OR MENTHOL PRODUCTS SO NO COUGH DROPS (Ludens ok) USE SUGARLESS CANDY INSTEAD (Jolley ranchers or Stover's or Environmental manager) or even ice chips will also do - the key is to swallow to prevent all throat clearing. NO OIL BASED VITAMINS - use powdered substitutes.  Avoid fish oil any oil based vitamins as long as  coughing.   Stop  zyrtec> For drainage / throat tickle try take CHLORPHENIRAMINE  4 mg  ("Allergy Relief" 4mg   at [facility] should be easiest to find in the blue box usually on bottom shelf)  take one every 4 hours as needed - extremely effective and inexpensive over the counter- may cause drowsiness so start with just a dose or two an hour before bedtime and see how you tolerate it before trying in daytime.   Please remember to go to the  x-ray department  for your tests - we will call you with the results when they are available    Call for appt next week if not feeling better but bring all medications

## 2022-12-04 NOTE — Assessment & Plan Note (Addendum)
Onset with covid Jan 2024  -  12/04/2022  After extensive coaching inhaler device,  effectiveness =    75% > try symbicort 80 1-2 bid  - Sinus CT ordered 12/04/2022 / added 1st gen H1 blockers per guidelines    Ddx is cough variant asthma vs Upper airway cough syndrome (previously labeled PNDS),  is so named because it's frequently impossible to sort out how much is  CR/sinusitis with freq throat clearing (which can be related to primary GERD)   vs  causing  secondary (" extra esophageal")  GERD from wide swings in gastric pressure that occur with throat clearing, often  promoting self use of mint and menthol lozenges that reduce the lower esophageal sphincter tone and exacerbate the problem further in a cyclical fashion.   These are the same pts (now being labeled as having "irritable larynx syndrome" by some cough centers) who not infrequently have a history of having failed to tolerate ace inhibitors,  dry powder inhalers or biphosphonates or report having atypical/extraesophageal reflux symptoms that don't respond to standard doses of PPI  and are easily confused as having aecopd or asthma flares by even experienced allergists/ pulmonologists (myself included).    Rec  Rx as cough variant asthma with symbicort 80 and the UACS as follows:  Of the three most common causes of  Sub-acute / recurrent or chronic cough, only one (GERD)  can actually contribute to/ trigger  the other two (asthma and post nasal drip syndrome)  and perpetuate the cylce of cough.  While not intuitively obvious, many patients with chronic low grade reflux do not cough until there is a primary insult that disturbs the protective epithelial barrier and exposes sensitive nerve endings.   This is typically viral but can due to PNDS and  either may apply here.     The point is that once this occurs, it is difficult to eliminate the cycle  using anything but a maximally effective acid suppression regimen at least in the short run,  accompanied by an appropriate diet to address non acid GERD and control / eliminate the pnds with 1st gen H1 blockers per guidelines  cough itself for at least 3 days with tramadol up to 100 mg every 4 hours if needed   Added 1st gen H1 blockers per guidelines  pending sinus ct (would avoid abx here given problems with diarrhea/ c diff chronically) and  >>> also  depomedrol 120 mg IM in  case of component of Th-2 driven upper or lower airways inflammation (if cough responds short term only to relapse before return while will on full rx for uacs (as above), then  that would point to allergic rhinitis/ asthma or eos bronchitis as alternative dx)          Each maintenance medication was reviewed in detail including emphasizing most importantly the difference between maintenance and prns and under what circumstances the prns are to be triggered using an action plan format where appropriate.  Total time for H and P, chart review, counseling, reviewing hfa  device(s) and generating customized AVS unique to this office visit / same day charting  > 60 min new pt eval

## 2022-12-09 NOTE — Addendum Note (Signed)
Addended by: Lajoyce Lauber A on: 12/09/2022 10:35 AM   Modules accepted: Orders

## 2022-12-17 ENCOUNTER — Ambulatory Visit (HOSPITAL_BASED_OUTPATIENT_CLINIC_OR_DEPARTMENT_OTHER): Admission: RE | Admit: 2022-12-17 | Payer: BLUE CROSS/BLUE SHIELD | Source: Ambulatory Visit

## 2023-01-28 ENCOUNTER — Encounter: Payer: Self-pay | Admitting: Cardiology

## 2023-03-24 ENCOUNTER — Encounter: Payer: Self-pay | Admitting: Internal Medicine

## 2023-03-24 ENCOUNTER — Ambulatory Visit: Payer: BLUE CROSS/BLUE SHIELD | Admitting: Internal Medicine

## 2023-03-24 VITALS — BP 100/80 | HR 81 | Temp 98.3°F | Ht 63.0 in | Wt 244.0 lb

## 2023-03-24 DIAGNOSIS — I10 Essential (primary) hypertension: Secondary | ICD-10-CM

## 2023-03-24 DIAGNOSIS — R739 Hyperglycemia, unspecified: Secondary | ICD-10-CM

## 2023-03-24 DIAGNOSIS — E538 Deficiency of other specified B group vitamins: Secondary | ICD-10-CM | POA: Diagnosis not present

## 2023-03-24 DIAGNOSIS — E876 Hypokalemia: Secondary | ICD-10-CM | POA: Diagnosis not present

## 2023-03-24 DIAGNOSIS — E7800 Pure hypercholesterolemia, unspecified: Secondary | ICD-10-CM

## 2023-03-24 DIAGNOSIS — R7303 Prediabetes: Secondary | ICD-10-CM

## 2023-03-24 DIAGNOSIS — Z0000 Encounter for general adult medical examination without abnormal findings: Secondary | ICD-10-CM | POA: Diagnosis not present

## 2023-03-24 LAB — CBC
HCT: 44.2 % (ref 36.0–46.0)
Hemoglobin: 14.5 g/dL (ref 12.0–15.0)
MCHC: 32.8 g/dL (ref 30.0–36.0)
MCV: 89.9 fl (ref 78.0–100.0)
Platelets: 242 10*3/uL (ref 150.0–400.0)
RBC: 4.91 Mil/uL (ref 3.87–5.11)
RDW: 13.4 % (ref 11.5–15.5)
WBC: 7.2 10*3/uL (ref 4.0–10.5)

## 2023-03-24 LAB — HEMOGLOBIN A1C: Hgb A1c MFr Bld: 5.9 % (ref 4.6–6.5)

## 2023-03-24 LAB — VITAMIN B12: Vitamin B-12: 382 pg/mL (ref 211–911)

## 2023-03-24 MED ORDER — LOSARTAN POTASSIUM-HCTZ 50-12.5 MG PO TABS
1.0000 | ORAL_TABLET | Freq: Every day | ORAL | 3 refills | Status: DC
Start: 2023-03-24 — End: 2023-09-15

## 2023-03-24 MED ORDER — ZEPBOUND 10 MG/0.5ML ~~LOC~~ SOAJ: 10.0000 mg | SUBCUTANEOUS | 0 refills | Status: DC

## 2023-03-24 MED ORDER — ZEPBOUND 2.5 MG/0.5ML ~~LOC~~ SOAJ: 2.5000 mg | SUBCUTANEOUS | 0 refills | Status: DC

## 2023-03-24 MED ORDER — ZEPBOUND 5 MG/0.5ML ~~LOC~~ SOAJ: 5.0000 mg | SUBCUTANEOUS | 0 refills | Status: DC

## 2023-03-24 MED ORDER — ZEPBOUND 7.5 MG/0.5ML ~~LOC~~ SOAJ: 7.5000 mg | SUBCUTANEOUS | 0 refills | Status: DC

## 2023-03-24 MED ORDER — MONTELUKAST SODIUM 10 MG PO TABS: 10.0000 mg | ORAL_TABLET | Freq: Every day | ORAL | 3 refills | Status: DC

## 2023-03-24 MED ORDER — ZEPBOUND 12.5 MG/0.5ML ~~LOC~~ SOAJ: 12.5000 mg | SUBCUTANEOUS | 0 refills | Status: DC

## 2023-03-24 MED ORDER — ZEPBOUND 15 MG/0.5ML ~~LOC~~ SOAJ: 15.0000 mg | SUBCUTANEOUS | 6 refills | Status: DC

## 2023-03-24 NOTE — Progress Notes (Unsigned)
   Subjective:   Patient ID: [name], female    DOB: 16-Mar-1956, 67 y.o.   MRN: [id number]  HPI The patient is here for physical.  PMH, [facility], social history reviewed and updated  Review of Systems  Constitutional: Negative.   HENT: Negative.    Eyes: Negative.   Respiratory:  Negative for cough, chest tightness and shortness of breath.   Cardiovascular:  Negative for chest pain, palpitations and leg swelling.  Gastrointestinal:  Negative for abdominal distention, abdominal pain, constipation, diarrhea, nausea and vomiting.  Musculoskeletal: Negative.   Skin: Negative.   Neurological: Negative.   Psychiatric/Behavioral: Negative.      Objective:  Physical Exam Constitutional:      Appearance: She is well-developed. She is obese.  HENT:     Head: Normocephalic and atraumatic.  Cardiovascular:     Rate and Rhythm: Normal rate and regular rhythm.  Pulmonary:     Effort: Pulmonary effort is normal. No respiratory distress.     Breath sounds: Normal breath sounds. No wheezing or rales.  Abdominal:     General: Bowel sounds are normal. There is no distension.     Palpations: Abdomen is soft.     Tenderness: There is no abdominal tenderness. There is no rebound.  Musculoskeletal:     Cervical back: Normal range of motion.  Skin:    General: Skin is warm and dry.  Neurological:     Mental Status: She is alert and oriented to person, place, and time.     Coordination: Coordination normal.     Vitals:   03/24/23 1320  BP: 100/80  Pulse: 81  Temp: 98.3 F (36.8 C)  TempSrc: Oral  SpO2: 96%  Weight: 244 lb (110.7 kg)  Height: 5\' 3"  (1.6 m)    Assessment & Plan:

## 2023-03-24 NOTE — Patient Instructions (Signed)
We have sent in zepbound and will check the labs.

## 2023-03-25 ENCOUNTER — Encounter: Payer: Self-pay | Admitting: Internal Medicine

## 2023-03-25 LAB — LIPID PANEL
Cholesterol: 150 mg/dL (ref 0–200)
HDL: 50.6 mg/dL (ref >39.00)
LDL Cholesterol: 62 mg/dL (ref 0–99)
NonHDL: 99.29
Total CHOL/HDL Ratio: 3
Triglycerides: 187 mg/dL — ABNORMAL HIGH (ref 0.0–149.0)
VLDL: 37.4 mg/dL (ref 0.0–40.0)

## 2023-03-25 LAB — COMPREHENSIVE METABOLIC PANEL
ALT: 22 U/L (ref 0–35)
AST: 21 U/L (ref 0–37)
Albumin: 4.7 g/dL (ref 3.5–5.2)
Alkaline Phosphatase: 85 U/L (ref 39–117)
BUN: 24 mg/dL — ABNORMAL HIGH (ref 6–23)
CO2: 27 meq/L (ref 19–32)
Calcium: 10.3 mg/dL (ref 8.4–10.5)
Chloride: 100 meq/L (ref 96–112)
Creatinine, Ser: 1.05 mg/dL (ref 0.40–1.20)
GFR: 56.02 mL/min — ABNORMAL LOW (ref >60.00)
Glucose, Bld: 103 mg/dL — ABNORMAL HIGH (ref 70–99)
Potassium: 4.2 meq/L (ref 3.5–5.1)
Sodium: 138 meq/L (ref 135–145)
Total Bilirubin: 0.6 mg/dL (ref 0.2–1.2)
Total Protein: 7.8 g/dL (ref 6.0–8.3)

## 2023-03-25 NOTE — Telephone Encounter (Signed)
Please do PA for patient

## 2023-03-26 ENCOUNTER — Other Ambulatory Visit (HOSPITAL_COMMUNITY): Payer: Self-pay

## 2023-03-26 ENCOUNTER — Encounter: Payer: Self-pay | Admitting: Internal Medicine

## 2023-03-26 NOTE — Assessment & Plan Note (Signed)
Checking CMP and adjust as needed. BP at goal on losartan/hydrochlorothiazide 50/12.5 mg daily.

## 2023-03-26 NOTE — Assessment & Plan Note (Signed)
Rx zepbound standard titration to help with weight loss as this is now covered.

## 2023-03-26 NOTE — Assessment & Plan Note (Signed)
Flu shot yearly. Shingrix 1st done. Tetanus up to date. Colonoscopy up to date. Mammogram up to date, pap smear up to date with gyn. Counseled about sun safety and mole surveillance. Counseled about the dangers of distracted driving. Given 10 year screening recommendations.

## 2023-03-26 NOTE — Assessment & Plan Note (Signed)
Checking HgA1c and adjust as needed.  

## 2023-03-26 NOTE — Assessment & Plan Note (Signed)
Checking lipid panel and adjust as needed using repatha

## 2023-04-01 ENCOUNTER — Other Ambulatory Visit (HOSPITAL_COMMUNITY): Payer: Self-pay

## 2023-04-03 ENCOUNTER — Other Ambulatory Visit (HOSPITAL_COMMUNITY): Payer: Self-pay

## 2023-04-03 ENCOUNTER — Telehealth: Payer: Self-pay

## 2023-04-03 NOTE — Telephone Encounter (Signed)
Pharmacy Patient Advocate Encounter   Received notification from  Staff messages  that prior authorization for Zepbound 2.5mg /0.58ml is required/requested.   Insurance verification completed.   The patient is insured through  [employer]  .   Per test claim: PA required; PA submitted to [employer] via Prompt PA Key/confirmation #/EOC [id number] Status is pending

## 2023-04-09 ENCOUNTER — Other Ambulatory Visit (HOSPITAL_COMMUNITY): Payer: Self-pay

## 2023-04-09 NOTE — Telephone Encounter (Signed)
Pharmacy Patient Advocate Encounter  Received notification from  [employer]  that Prior Authorization for Zepbound has been APPROVED from 04/04/23 to 12/03/23   PA #/Case ID/Reference #: [id number]  Approval letter indexed to media tab.   Left a voicemail at [facility] to notify of the approval.

## 2023-04-10 NOTE — Progress Notes (Signed)
HPI: [name], hyperlipidemia. Nuclear study 9/17 showed ejection fraction 63%. There was a small defect suggestive of ischemia in the apical lateral wall. Cardiac catheterization 11/17 showed normal LV function and normal coronary anatomy. Pt intolerant to statins and referred to lipid clinic; started on repatha.  Patient seen with recurrent falls and question syncope December 2021. Monitor 1/22 showed sinus bradycardia, normal sinus rhythm and sinus tachycardia. Echocardiogram January 2023 showed normal LV function, grade 1 diastolic dysfunction.  Since last seen,  the patient has dyspnea with more extreme activities but not with routine activities. It is relieved with rest. It is not associated with chest pain. There is no orthopnea, PND or pedal edema. There is no syncope or palpitations. There is no exertional chest pain.   Current Outpatient Medications  Medication Sig Dispense Refill   albuterol (PROVENTIL) (2.5 MG/3ML) 0.083% nebulizer solution Take 3 mLs (2.5 mg total) by nebulization every 6 (six) hours as needed for wheezing or shortness of breath. 150 mL 1   albuterol (VENTOLIN HFA) 108 (90 Base) MCG/ACT inhaler Inhale 1-2 puffs into the lungs every 4 (four) hours as needed for wheezing or shortness of breath. 18 g 0   budesonide-formoterol (SYMBICORT) 80-4.5 MCG/ACT inhaler Take 2 puffs first thing in am and then another 2 puffs about 12 hours later. 1 each 12   Evolocumab (REPATHA SURECLICK) 140 MG/ML SOAJ ADMINISTER 1 ML UNDER THE SKIN EVERY 14 DAYS 2 mL 11   ezetimibe (ZETIA) 10 MG tablet TAKE 1 TABLET(10 MG) BY MOUTH DAILY 90 tablet 3   fluticasone (FLONASE) 50 MCG/ACT nasal spray Place 2 sprays into both nostrils 2 (two) times daily as needed.  5   losartan-hydrochlorothiazide (HYZAAR) 50-12.5 MG tablet Take 1 tablet by mouth daily. 90 tablet 3   montelukast (SINGULAIR) 10 MG tablet Take 1 tablet (10 mg total) by mouth at bedtime. 90 tablet 3   tirzepatide (ZEPBOUND) 2.5  MG/0.5ML Pen Inject 2.5 mg into the skin once a week. 2 mL 0   Vitamin D, Ergocalciferol, (DRISDOL) 1.25 MG (50000 UNIT) CAPS capsule Take 1 capsule (50,000 Units total) by mouth every 7 (seven) days. 12 capsule 0   cyanocobalamin (VITAMIN B12) 1000 MCG tablet Take 1 tablet (1,000 mcg total) by mouth daily. (Patient not taking: Reported on 04/18/2023) 90 tablet 3   famotidine (PEPCID) 40 MG tablet Take 40 mg by mouth 2 (two) times daily. (Patient not taking: Reported on 04/18/2023)     potassium chloride (KLOR-CON 10) 10 MEQ tablet Take 1 tablet (10 mEq total) by mouth 3 (three) times daily. (Patient not taking: Reported on 04/18/2023) 90 tablet 0   tirzepatide (ZEPBOUND) 10 MG/0.5ML Pen Inject 10 mg into the skin once a week. (Patient not taking: Reported on 04/18/2023) 2 mL 0   tirzepatide (ZEPBOUND) 12.5 MG/0.5ML Pen Inject 12.5 mg into the skin once a week. (Patient not taking: Reported on 04/18/2023) 2 mL 0   tirzepatide (ZEPBOUND) 15 MG/0.5ML Pen Inject 15 mg into the skin once a week. (Patient not taking: Reported on 04/18/2023) 2 mL 6   tirzepatide (ZEPBOUND) 5 MG/0.5ML Pen Inject 5 mg into the skin once a week. (Patient not taking: Reported on 04/18/2023) 2 mL 0   tirzepatide (ZEPBOUND) 7.5 MG/0.5ML Pen Inject 7.5 mg into the skin once a week. (Patient not taking: Reported on 04/18/2023) 2 mL 0   No current facility-administered medications for this visit.     Past Medical History:  Diagnosis Date  Arthritis    neck, knees, ankles, feet, back   Asthma, mild intermittent    prn inhaler   Cervical spondylosis    Chronic dyspnea    per pt gets winded w/ stairs, but recovers quickly   Diverticular disease of colon    followed by dr Loreta Ave (GI)---  chronic w/ chronic diarrhea   Essential hypertension    Fatty infiltration of liver    followed by dr Loreta Ave--- abd ultrasound in epic 08-31-2020   Frequency of urination    History of chest pain    evaluated by cardiologist-- dr Jens Som,  11/  2017 nuclear stress test suggest ischemia,  06/2016 cardiac cath , showed normal coronary anatomy and lvf   History of COVID-19    per pt in 2019   treated with presumed covid had  mild to moderate symptoms that resolved   History of diverticulitis of colon    05/ 2022   History of TIA (transient ischemic attack) 06/2003   Hyperlipidemia    Hypertension    Limitation of joint motion of neck    per pt due to bone spurs C2-5   Mild intermittent asthma    followed by pcp   PMB (postmenopausal bleeding)    Pre-diabetes    Pulmonary nodule    followed by pcp,  last hest CT in epic 04-21-2018   Stenosis of cervix    SUI (stress urinary incontinence, female)    Thickened endometrium    TMJ syndrome    Wears glasses     Past Surgical History:  Procedure Laterality Date   APPENDECTOMY  1979   BREAST ENHANCEMENT SURGERY Bilateral 1988   per pt implants removed 2002   BUNIONECTOMY Left 07/17/2007   @[facility]    BUNIONECTOMY Right 05/26/2014   Procedure: RIGHT FOOT LAPIDUS BUNION CORRECTION AMD MODIFIED MCBRIDE BUNIONECTOMY;  Surgeon: Toni Arthurs, MD;  Location: [facility];  Service: Orthopedics;  Laterality: Right;   CARDIAC CATHETERIZATION N/A 06/28/2016   Procedure: Left Heart Cath and Coronary Angiography;  Surgeon: Peter M Swaziland, MD;  Location: [facility] IN[facility]ASIVE CV [facility];  Service: Cardiovascular;  Laterality: N/A;   CESAREAN SECTION  1986   COLONOSCOPY  12/27/2020   by dr Loreta Ave   FINGER SURGERY Left 07/28/2015   @[facility]  :  left thumb "knuckle collasped"  arthroplasty   FOOT HARDWARE REMOVAL  04/25/2016   @[facility] ;   right foot   FOOT SURGERY Right 04/04/2015   @[facility] ;   first and second toe's   GANGLION CYST EXCISION Right 2016   wrist   HYSTEROSCOPY WITH D & C N/A 05/24/2021   Procedure: DILATATION AND CURETTAGE /HYSTEROSCOPY and MyoSure;  Surgeon: Toy Baker, DO;  Location: [facility];  Service: Gynecology;  Laterality: N/A;   NASAL SEPTUM SURGERY  1981    ROTATOR CUFF REPAIR W/ DISTAL CLAVICLE EXCISION Left 08/18/2008   @[facility]  by dr Simonne Come   SHOULDER ARTHROSCOPY W/ LABRAL REPAIR Left 06/07/2008   @[facility] ;  and SAD  by dr Simonne Come   TONSILLECTOMY  1964   TUBAL LIGATION Bilateral 1999    Social History   Socioeconomic History   Marital status: Married    Spouse name: Not on file   Number of children: 2   Years of education: Not on file   Highest education level: Not on file  Occupational History   Not on file  Tobacco Use   Smoking status: Former    Current packs/day: 0.00    Types: Cigarettes  Start date: 09/13/1975    Quit date: 09/12/2005    Years since quitting: 17.6   Smokeless tobacco: Never  Vaping Use   Vaping status: Never Used  Substance and Sexual Activity   Alcohol use: Not Currently    Comment: rare   Drug use: No   Sexual activity: Not on file  Other Topics Concern   Not on file  Social History Narrative   Right hand   Lives with husband   One story home   Does work in [employer]   Drinks caffeine   Social Determinants of Health   Financial Resource Strain: Not on file  Food Insecurity: Not on file  Transportation Needs: Not on file  Physical Activity: Not on file  Stress: Not on file  Social Connections: Unknown (12/18/2021)   [facility]   Social Network    Social Network: Not on file  Intimate Partner Violence: Unknown (11/09/2021)   Received from [facility]   HITS    Physically Hurt: Not on file    Insult or Talk Down To: Not on file    Threaten Physical Harm: Not on file    Scream or Curse: Not on file    Family History  Problem Relation Age of Onset   Cancer Mother        lung   Diabetes Mother    Thalassemia Mother    Colon polyps Mother    Cancer Brother        lymphatic   Heart disease Paternal Aunt    Atrial fibrillation Father    Thalassemia Sister    Thalassemia Maternal Aunt    Colon cancer Neg Hx    Rectal cancer Neg Hx    Stomach cancer Neg Hx     ROS:  no fevers or chills, productive cough, hemoptysis, dysphasia, odynophagia, melena, hematochezia, dysuria, hematuria, rash, seizure activity, orthopnea, PND, pedal edema, claudication. Remaining systems are negative.  Physical Exam: Well-developed well-nourished in no acute distress.  Skin is warm and dry.  HEENT is normal.  Neck is supple.  Chest is clear to auscultation with normal expansion.  Cardiovascular exam is regular rate and rhythm.  Abdominal exam nontender or distended. No masses palpated. Extremities show no edema. neuro grossly intact   A/P  1 hypertension-BP controlled; continue present meds and follow.   2 hyperlipidemia-continue Repatha.   3 history of dyspnea-felt secondary to a combination of obesity, deconditioning and obesity hypoventilation syndrome. Previously discussed evaluation for sleep apnea and she declined.    4 obesity-she is working on weight loss.  Olga Millers, MD

## 2023-04-18 ENCOUNTER — Ambulatory Visit: Payer: BLUE CROSS/BLUE SHIELD | Attending: Cardiology | Admitting: Cardiology

## 2023-04-18 ENCOUNTER — Encounter: Payer: Self-pay | Admitting: Cardiology

## 2023-04-18 VITALS — BP 128/76 | HR 82 | Ht 63.0 in | Wt 243.6 lb

## 2023-04-18 DIAGNOSIS — I10 Essential (primary) hypertension: Secondary | ICD-10-CM

## 2023-04-18 DIAGNOSIS — E785 Hyperlipidemia, unspecified: Secondary | ICD-10-CM | POA: Diagnosis not present

## 2023-04-18 DIAGNOSIS — R0602 Shortness of breath: Secondary | ICD-10-CM

## 2023-04-18 NOTE — Patient Instructions (Signed)

## 2023-06-07 ENCOUNTER — Other Ambulatory Visit: Payer: Self-pay | Admitting: Internal Medicine

## 2023-06-26 LAB — HM DIABETES EYE EXAM

## 2023-08-09 ENCOUNTER — Encounter: Payer: Self-pay | Admitting: Internal Medicine

## 2023-08-09 ENCOUNTER — Other Ambulatory Visit: Payer: Self-pay | Admitting: Internal Medicine

## 2023-08-10 ENCOUNTER — Other Ambulatory Visit: Payer: Self-pay | Admitting: Internal Medicine

## 2023-08-11 MED ORDER — ZEPBOUND 10 MG/0.5ML ~~LOC~~ SOAJ: 10.0000 mg | SUBCUTANEOUS | 3 refills | Status: DC

## 2023-08-18 ENCOUNTER — Ambulatory Visit: Payer: Self-pay | Admitting: Internal Medicine

## 2023-08-18 ENCOUNTER — Ambulatory Visit: Payer: BLUE CROSS/BLUE SHIELD | Admitting: Family Medicine

## 2023-08-18 NOTE — Telephone Encounter (Signed)
  Chief Complaint: cough, fever Symptoms: cough, fever, fatigue Frequency: 5 days Pertinent Negatives: Patient denies Increased SOB, runny nose, congestion, CP Disposition: [] ED /[] Urgent Care (no appt availability in office) / [x] Appointment(In office/virtual)/ []   Virtual Care/ [] Home Care/ [] Refused Recommended Disposition /[]  Mobile Bus/ []  Follow-up with PCP Additional Notes: Patient calls stating she has had a dry cough for 5 days. Reports that yesterday she began to run a fever of 101F, controlled by tylenol . Reports mild SOB, but states that is her baseline due to history of asthma. Denies congestion, runny nose, chest pain. Per protocol, patient to be seen within 24 hours. Patient requests afternoon appt with any provider today. Scheduled for 1600 in clinic with alternate provider. Alerting PCP for review.   Copied from CRM [id number]. Topic: Clinical - Red Word Triage >> Aug 18, 2023  7:36 AM Russell PARAS wrote: Red Word that prompted transfer to Nurse Triage: Fever of 101 yesterday. Taking Tylenol  every 4 hours and taking cough medication. Daily Zyrtec , albuterol , and Singulair . Severe cough, having difficulty breathing. Thinks she may pneumonia, symptoms not improving. Reason for Disposition  [1] Continuous (nonstop) coughing interferes with work or school AND [2] no improvement using cough treatment per Care Advice  Answer Assessment - Initial Assessment Questions 1. ONSET: When did the cough begin?      5 days 2. SEVERITY: How bad is the cough today?      All the time 3. SPUTUM: Describe the color of your sputum (none, dry cough; clear, white, yellow, green)     One time coughed up one spot of yellow 4. HEMOPTYSIS: Are you coughing up any blood? If so ask: How much? (flecks, streaks, tablespoons, etc.)     Denies 5. DIFFICULTY BREATHING: Are you having difficulty breathing? If Yes, ask: How bad is it? (e.g., mild, moderate, severe)    - MILD: No  SOB at rest, mild SOB with walking, speaks normally in sentences, can lie down, no retractions, pulse < 100.    - MODERATE: SOB at rest, SOB with minimal exertion and prefers to sit, cannot lie down flat, speaks in phrases, mild retractions, audible wheezing, pulse 100-120.    - SEVERE: Very SOB at rest, speaks in single words, struggling to breathe, sitting hunched forward, retractions, pulse > 120      Mild 6. FEVER: Do you have a fever? If Yes, ask: What is your temperature, how was it measured, and when did it start?     Yesterday, 101F controlled with tylenol  7. CARDIAC HISTORY: Do you have any history of heart disease? (e.g., heart attack, congestive heart failure)      Denies 8. LUNG HISTORY: Do you have any history of lung disease?  (e.g., pulmonary embolus, asthma, emphysema)     denies 9. PE RISK FACTORS: Do you have a history of blood clots? (or: recent major surgery, recent prolonged travel, bedridden)     Denies 10. OTHER SYMPTOMS: Do you have any other symptoms? (e.g., runny nose, wheezing, chest pain)       Denies  Protocols used: Cough - Acute Non-Productive-A-AH

## 2023-08-18 NOTE — Progress Notes (Deleted)
   Acute Office Visit  Subjective:     Patient ID: [name], female    DOB: 08/21/1955, 68 y.o.   MRN: [id number]  No chief complaint on file.   HPI Patient is in today for evaluation of ***, for the last ***. Has tried Denies known sick contacts, Denies abdominal pain, nausea, vomiting, diarrhea, rash, fever, chills, other symptoms.  Medical hx as outlined below.  ROS Per HPI      Objective:    There were no vitals taken for this visit.   Physical Exam Vitals and nursing note reviewed.  HENT:     Head: Normocephalic and atraumatic.     Nose: No congestion.     Mouth/Throat:     Mouth: Mucous membranes are moist.     Pharynx: Oropharynx is clear. No oropharyngeal exudate or posterior oropharyngeal erythema.  Eyes:     Extraocular Movements: Extraocular movements intact.  Cardiovascular:     Rate and Rhythm: Normal rate and regular rhythm.  Pulmonary:     Effort: Pulmonary effort is normal. No respiratory distress.  Musculoskeletal:     Cervical back: Normal range of motion and neck supple.  Lymphadenopathy:     Cervical: No cervical adenopathy.  Skin:    General: Skin is warm and dry.  Neurological:     Mental Status: She is alert.   No results found for any visits on 08/18/23.      Assessment & Plan:  ***  No orders of the defined types were placed in this encounter.   No follow-ups on file.  Corean Ku, FNP

## 2023-09-02 ENCOUNTER — Other Ambulatory Visit: Payer: Self-pay | Admitting: Internal Medicine

## 2023-09-14 ENCOUNTER — Encounter: Payer: Self-pay | Admitting: Cardiology

## 2023-09-25 ENCOUNTER — Encounter: Payer: Self-pay | Admitting: Internal Medicine

## 2023-09-26 NOTE — Telephone Encounter (Signed)
 I do not see this on patient medication list

## 2023-09-26 NOTE — Telephone Encounter (Signed)
 Take 1 tablet by mouth daily (10mg )

## 2023-09-29 MED ORDER — CITALOPRAM HYDROBROMIDE 10 MG PO TABS: 10.0000 mg | ORAL_TABLET | Freq: Every day | ORAL | 3 refills | Status: AC

## 2023-09-29 NOTE — Addendum Note (Signed)
 Addended by: Myrlene Broker on: 09/29/2023 08:40 AM   Modules accepted: Orders

## 2023-10-07 ENCOUNTER — Ambulatory Visit: Payer: Self-pay | Admitting: Internal Medicine

## 2023-10-07 NOTE — Telephone Encounter (Signed)
 Chief Complaint: Vaginal Bleeding Symptoms: Pressure in LUQ Frequency: Intermittent Pertinent Negatives: Patient denies pain Disposition: [] ED /[] Urgent Care (no appt availability in office) / [x] Appointment(In office/virtual)/ []  [place] Virtual Care/ [] Home Care/ [] Refused Recommended Disposition /[] [place] Mobile Bus/ []  Follow-up with PCP Additional Notes: Pt states she noticed red blood in her urinate today. This has happened one time. Pt is not sure the amount of blood. Pt states she had a hysterectomy in the past. Pt states concern as she has a family history of bladder cancer. Pt scheduled for an appointment with another provider tomorrow morning. This RN educated pt on home care, new-worsening symptoms, when to call back/seek emergent care. Pt verbalized understanding and agrees to plan.    Copied from CRM [id number]. Topic: Clinical - Red Word Triage >> Oct 07, 2023  1:54 PM [name] wrote: [facility] that prompted transfer to Nurse Triage: Patient stated that she is having some vaginal bleeding, and would like a urinalysis. Stated she has had a hysterectomy Reason for Disposition  Bleeding or spotting occurs after hysterectomy  Answer Assessment - Initial Assessment Questions 1. AMOUNT: "Describe the bleeding that you are having."    - SPOTTING: spotting, or pinkish / brownish mucous discharge; does not fill panty liner or pad    - MILD:  less than 1 pad / hour; less than patient's usual menstrual bleeding   - MODERATE: 1-2 pads / hour; 1 menstrual cup every 6 hours; small-medium blood clots (e.g., pea, grape, small coin)   - SEVERE: soaking 2 or more pads/hour for 2 or more hours; 1 menstrual cup every 2 hours; bleeding not contained by pads or continuous red blood from vagina; large blood clots (e.g., golf ball, large coin)      Not sure 2. ONSET: "When did the bleeding begin?" "Is it continuing now?"     45 minutes ago 3. MENSTRUAL PERIOD: "When was the last normal menstrual  period?" "How is this different than your period?"     Pt had a hysterectomy 4. REGULARITY: "How regular are your periods?"     N/A 5. ABDOMEN PAIN: "Do you have any pain?" "How bad is the pain?"  (e.g., Scale 1-10; mild, moderate, or severe)   - MILD (1-3): doesn't interfere with normal activities, abdomen soft and not tender to touch    - MODERATE (4-7): interferes with normal activities or awakens from sleep, abdomen tender to touch    - SEVERE (8-10): excruciating pain, doubled over, unable to do any normal activities      Pressure in LUQ  Protocols used: Vaginal Bleeding - Abnormal-A-AH

## 2023-10-07 NOTE — Progress Notes (Deleted)
   Acute Office Visit  Subjective:     Patient ID: [name], female    DOB: 1956/01/25, 68 y.o.   MRN: [id number]  No chief complaint on file.   HPI Patient is in today for evaluation of vaginal bleeding. She is postmenopausal.  ROS Per HPI      Objective:    There were no vitals taken for this visit.   Physical Exam Vitals and nursing note reviewed.  Constitutional:      Appearance: Normal appearance. She is normal weight.  HENT:     Head: Normocephalic and atraumatic.     Right Ear: Tympanic membrane and ear canal normal.     Left Ear: Tympanic membrane and ear canal normal.     Nose: Nose normal.  Eyes:     Extraocular Movements: Extraocular movements intact.     Pupils: Pupils are equal, round, and reactive to light.  Cardiovascular:     Rate and Rhythm: Normal rate and regular rhythm.     Heart sounds: Normal heart sounds.  Pulmonary:     Effort: Pulmonary effort is normal.     Breath sounds: Normal breath sounds.  Musculoskeletal:        General: Normal range of motion.     Cervical back: Normal range of motion.  Neurological:     General: No focal deficit present.     Mental Status: She is alert and oriented to person, place, and time.  Psychiatric:        Mood and Affect: Mood normal.        Thought Content: Thought content normal.   No results found for any visits on 10/08/23.      Assessment & Plan:  ***  No orders of the defined types were placed in this encounter.   No follow-ups on file.  Moshe Cipro, FNP

## 2023-10-08 ENCOUNTER — Ambulatory Visit: Admitting: Family Medicine

## 2023-10-09 ENCOUNTER — Ambulatory Visit: Admitting: Internal Medicine

## 2023-10-19 ENCOUNTER — Encounter (HOSPITAL_COMMUNITY): Payer: Self-pay

## 2023-10-19 ENCOUNTER — Emergency Department (HOSPITAL_COMMUNITY)
Admission: EM | Admit: 2023-10-19 | Discharge: 2023-10-19 | Disposition: A | Discharge status: Home / Self Care | Attending: Emergency Medicine | Admitting: Emergency Medicine

## 2023-10-19 ENCOUNTER — Emergency Department (HOSPITAL_COMMUNITY)

## 2023-10-19 ENCOUNTER — Other Ambulatory Visit: Payer: Self-pay

## 2023-10-19 DIAGNOSIS — R112 Nausea with vomiting, unspecified: Secondary | ICD-10-CM | POA: Diagnosis present

## 2023-10-19 DIAGNOSIS — R197 Diarrhea, unspecified: Secondary | ICD-10-CM | POA: Insufficient documentation

## 2023-10-19 LAB — CBC WITH DIFFERENTIAL/PLATELET
Abs Immature Granulocytes: 0.02 10*3/uL (ref 0.00–0.07)
Basophils Absolute: 0 10*3/uL (ref 0.0–0.1)
Basophils Relative: 0 %
Eosinophils Absolute: 0 10*3/uL (ref 0.0–0.5)
Eosinophils Relative: 1 %
HCT: 43.6 % (ref 36.0–46.0)
Hemoglobin: 14.8 g/dL (ref 12.0–15.0)
Immature Granulocytes: 0 %
Lymphocytes Relative: 5 %
Lymphs Abs: 0.4 10*3/uL — ABNORMAL LOW (ref 0.7–4.0)
MCH: 29.4 pg (ref 26.0–34.0)
MCHC: 33.9 g/dL (ref 30.0–36.0)
MCV: 86.5 fL (ref 80.0–100.0)
Monocytes Absolute: 0.3 10*3/uL (ref 0.1–1.0)
Monocytes Relative: 4 %
Neutro Abs: 6.2 10*3/uL (ref 1.7–7.7)
Neutrophils Relative %: 90 %
Platelets: 228 10*3/uL (ref 150–400)
RBC: 5.04 MIL/uL (ref 3.87–5.11)
RDW: 13.4 % (ref 11.5–15.5)
WBC: 6.9 10*3/uL (ref 4.0–10.5)
nRBC: 0 % (ref 0.0–0.2)

## 2023-10-19 LAB — COMPREHENSIVE METABOLIC PANEL
ALT: 15 U/L (ref 0–44)
AST: 28 U/L (ref 15–41)
Albumin: 4 g/dL (ref 3.5–5.0)
Alkaline Phosphatase: 71 U/L (ref 38–126)
Anion gap: 14 (ref 5–15)
BUN: 22 mg/dL (ref 8–23)
CO2: 22 mmol/L (ref 22–32)
Calcium: 9.4 mg/dL (ref 8.9–10.3)
Chloride: 100 mmol/L (ref 98–111)
Creatinine, Ser: 1.07 mg/dL — ABNORMAL HIGH (ref 0.44–1.00)
GFR, Estimated: 58 mL/min — ABNORMAL LOW (ref >60)
Glucose, Bld: 104 mg/dL — ABNORMAL HIGH (ref 70–99)
Potassium: 4.3 mmol/L (ref 3.5–5.1)
Sodium: 136 mmol/L (ref 135–145)
Total Bilirubin: 1.1 mg/dL (ref 0.0–1.2)
Total Protein: 6.9 g/dL (ref 6.5–8.1)

## 2023-10-19 LAB — LIPASE, BLOOD: Lipase: 26 U/L (ref 11–51)

## 2023-10-19 LAB — TROPONIN I (HIGH SENSITIVITY): Troponin I (High Sensitivity): 3 ng/L (ref <18)

## 2023-10-19 MED ORDER — ONDANSETRON 4 MG PO TBDP: 4.0000 mg | ORAL_TABLET | Freq: Three times a day (TID) | ORAL | 0 refills | Status: DC | PRN

## 2023-10-19 MED ORDER — DICYCLOMINE HCL 20 MG PO TABS: 20.0000 mg | ORAL_TABLET | Freq: Two times a day (BID) | ORAL | 0 refills | Status: DC | PRN

## 2023-10-19 MED ORDER — FAMOTIDINE IN NACL 20-0.9 MG/50ML-% IV SOLN
20.0000 mg | Freq: Once | INTRAVENOUS | Status: AC
  Administered 2023-10-19: 20 mg via INTRAVENOUS
  Filled 2023-10-19: qty 50

## 2023-10-19 MED ORDER — SODIUM CHLORIDE 0.9 % IV BOLUS
1000.0000 mL | Freq: Once | INTRAVENOUS | Status: AC
  Administered 2023-10-19: 1000 mL via INTRAVENOUS

## 2023-10-19 MED ORDER — ALUM & MAG HYDROXIDE-SIMETH 200-200-20 MG/5ML PO SUSP
30.0000 mL | Freq: Once | ORAL | Status: AC
  Administered 2023-10-19: 30 mL via ORAL
  Filled 2023-10-19: qty 30

## 2023-10-19 MED ORDER — DICYCLOMINE HCL 10 MG/ML IM SOLN
20.0000 mg | Freq: Once | INTRAMUSCULAR | Status: AC
  Administered 2023-10-19: 20 mg via INTRAMUSCULAR
  Filled 2023-10-19: qty 2

## 2023-10-19 NOTE — Discharge Instructions (Addendum)
 Medication resent to your pharmacy to help with nausea and cramping abdominal pain and diarrhea.  Please make an effort to stay hydrated and drink plenty of water.  Follow-up with your primary care doctor.

## 2023-10-19 NOTE — ED Provider Notes (Signed)
 [facility] EMERGENCY DEPARTMENT AT [facility] Provider Note   CSN: [id number] Arrival date & time: 10/19/23  1259     History  CC: Nausea, vomiting and diarrhea   [name] is a 68 y.o. female presenting Emergency Department with complaint of nausea vomiting and diarrhea.  Patient reports that she took her typical Zepbound medication she has been on chronically as an injection yesterday.  She says she ate a taco for dinner.  She woke up in the middle of the night and early this morning with abrupt onset of severe nausea vomiting and diarrhea.  She reports she was in the bathroom the entire night vomiting with loose bowel movements.  She vomited also the ambulance.  At that point she was having pain in her chest and her jaw, and was given 124 mg of aspirin as well as 1 nitroglycerin and 4 of Zofran by the ambulance.  She reports that the pain is improved, patient still feels extremely nauseated.  Patient seen by cardiology in the past, and had a cardiac cath in 2017 that showed normal left ventricular function and normal coronary anatomy.  HPI     Home Medications Prior to Admission medications   Medication Sig Start Date End Date Taking? Authorizing Provider  dicyclomine (BENTYL) 20 MG tablet Take 1 tablet (20 mg total) by mouth 2 (two) times daily as needed for up to 14 doses for spasms. 10/19/23  Yes Terald Sleeper, MD  ondansetron (ZOFRAN-ODT) 4 MG disintegrating tablet Take 1 tablet (4 mg total) by mouth every 8 (eight) hours as needed for up to 15 doses for nausea or vomiting. 10/19/23  Yes Terald Sleeper, MD  albuterol (PROVENTIL) (2.5 MG/3ML) 0.083% nebulizer solution Take 3 mLs (2.5 mg total) by nebulization every 6 (six) hours as needed for wheezing or shortness of breath. 08/30/22   Margaretann Loveless, PA-C  albuterol (VENTOLIN HFA) 108 (90 Base) MCG/ACT inhaler Inhale 1-2 puffs into the lungs every 4 (four) hours as needed for wheezing or shortness of  breath. 08/30/22   Margaretann Loveless, PA-C  budesonide-formoterol (SYMBICORT) 80-4.5 MCG/ACT inhaler Take 2 puffs first thing in am and then another 2 puffs about 12 hours later. 12/04/22   Nyoka Cowden, MD  citalopram (CELEXA) 10 MG tablet Take 1 tablet (10 mg total) by mouth daily. 09/29/23   Myrlene Broker, MD  cyanocobalamin (VITAMIN B12) 1000 MCG tablet Take 1 tablet (1,000 mcg total) by mouth daily. Patient not taking: Reported on 04/18/2023 07/05/22   Myrlene Broker, MD  Evolocumab (REPATHA SURECLICK) 140 MG/ML SOAJ ADMINISTER 1 ML UNDER THE SKIN EVERY 14 DAYS 07/10/22   Lewayne Bunting, MD  ezetimibe (ZETIA) 10 MG tablet TAKE 1 TABLET(10 MG) BY MOUTH DAILY 08/12/22   Rollene Rotunda, MD  famotidine (PEPCID) 40 MG tablet Take 40 mg by mouth 2 (two) times daily. Patient not taking: Reported on 04/18/2023 10/15/22   [provider]  fluticasone (FLONASE) 50 MCG/ACT nasal spray Place 2 sprays into both nostrils 2 (two) times daily as needed. 08/23/16   [provider]  montelukast (SINGULAIR) 10 MG tablet Take 1 tablet (10 mg total) by mouth at bedtime. 03/24/23   Myrlene Broker, MD  potassium chloride (KLOR-CON 10) 10 MEQ tablet Take 1 tablet (10 mEq total) by mouth 3 (three) times daily. Patient not taking: Reported on 04/18/2023 10/02/22   Etta Grandchild, MD  tirzepatide [facility]) 10 MG/0.5ML Pen Inject 10 mg into the  skin once a week. 08/11/23   Myrlene Broker, MD  Vitamin D, Ergocalciferol, (DRISDOL) 1.25 MG (50000 UNIT) CAPS capsule Take 1 capsule (50,000 Units total) by mouth every 7 (seven) days. 07/05/22   Myrlene Broker, MD      Allergies    Doxycycline, Hydrocodone bit-homatrop mbr, Penicillins, Promethazine hcl, Clarithromycin, Statins, and Kiwi extract    Review of Systems   Review of Systems  Physical Exam Updated Vital Signs BP (!) 99/58   Pulse 97   Temp 99.3 F (37.4 C) (Oral)   Resp 17   Ht 5\' 2"  (1.575 m)   Wt 91.2 kg    SpO2 97%   BMI 36.76 kg/m  Physical Exam Constitutional:      General: She is not in acute distress. HENT:     Head: Normocephalic and atraumatic.  Eyes:     Conjunctiva/sclera: Conjunctivae normal.     Pupils: Pupils are equal, round, and reactive to light.  Cardiovascular:     Rate and Rhythm: Normal rate and regular rhythm.  Pulmonary:     Effort: Pulmonary effort is normal. No respiratory distress.  Abdominal:     General: There is no distension.     Tenderness: There is no abdominal tenderness.  Skin:    General: Skin is warm and dry.  Neurological:     General: No focal deficit present.     Mental Status: She is alert. Mental status is at baseline.  Psychiatric:        Mood and Affect: Mood normal.        Behavior: Behavior normal.     ED Results / Procedures / Treatments   Labs (all labs ordered are listed, but only abnormal results are displayed) Labs Reviewed  COMPREHENSIVE METABOLIC PANEL - Abnormal; Notable for the following components:      Result Value   Glucose, Bld 104 (*)    Creatinine, Ser 1.07 (*)    GFR, Estimated 58 (*)    All other components within normal limits  CBC WITH DIFFERENTIAL/PLATELET - Abnormal; Notable for the following components:   Lymphs Abs 0.4 (*)    All other components within normal limits  LIPASE, BLOOD  URINALYSIS, ROUTINE W REFLEX MICROSCOPIC  TROPONIN I (HIGH SENSITIVITY)    EKG EKG Interpretation Date/Time:  Sunday October 19 2023 13:07:51 EDT Ventricular Rate:  106 PR Interval:  148 QRS Duration:  95 QT Interval:  355 QTC Calculation: 472 R Axis:   -48  Text Interpretation: Sinus tachycardia Left axis deviation Confirmed by Alvester Chou [phone]) on 10/19/2023 1:30:40 PM  Radiology DG Chest Portable 1 View Result Date: 10/19/2023 CLINICAL DATA:  Chest pain EXAM: PORTABLE CHEST 1 VIEW COMPARISON:  12/04/2022 FINDINGS: The heart size and mediastinal contours are within normal limits. Aortic atherosclerosis. Both  lungs are clear. The visualized skeletal structures are unremarkable. IMPRESSION: No active disease. Electronically Signed   By: Duanne Guess D.O.   On: 10/19/2023 14:08    Procedures Procedures    Medications Ordered in ED Medications  sodium chloride 0.9 % bolus 1,000 mL (1,000 mLs Intravenous New Bag/Given 10/19/23 1353)  famotidine (PEPCID) IVPB 20 mg premix (0 mg Intravenous Stopped 10/19/23 1425)  alum & mag hydroxide-simeth (MAALOX/MYLANTA) 200-200-20 MG/5ML suspension 30 mL (30 mLs Oral Given 10/19/23 1354)  dicyclomine (BENTYL) injection 20 mg (20 mg Intramuscular Given 10/19/23 1355)    ED Course/ Medical Decision Making/ A&P Clinical Course as of 10/19/23 1604  Sun Oct 19, 2023  1544 Patient reassessed and she is feeling much better.  Abdominal cramping and nausea has improved. [MT]    Clinical Course User Index [MT] Gianna Calef, Kermit Balo, MD                                 Medical Decision Making Amount and/or Complexity of Data Reviewed Labs: ordered. Radiology: ordered.  Risk OTC drugs. Prescription drug management.   This patient presents to the ED with concern for nausea, vomiting, diarrhea. This involves an extensive number of treatment options, and is a complaint that carries with it a high risk of complications and morbidity.  The differential diagnosis includes gastroenteritis versus foodborne illness versus other  Additional history obtained from EMS  External records from outside source obtained and reviewed including [facility] records  I ordered and personally interpreted labs.  The pertinent results include: No emergent findings.  Negative troponin  I ordered imaging studies including x-ray of the chest I independently visualized and interpreted imaging which showed no emergent findings I agree with the radiologist interpretation  The patient was maintained on a cardiac monitor.  I personally viewed and interpreted the cardiac monitored which showed an  underlying rhythm of: Sinus rhythm borderline sinus tachycardia  Per my interpretation the patient's ECG shows no acute ischemic findings  I ordered medication including GI medications and Bentyl for diarrhea cramping pain  I have reviewed the patients home medicines and have made adjustments as needed  Test Considered: Overall benign abdominal exam.  Low suspicion for acute biliary disease or other intra-abdominal emergency.  No indication for ultrasound or CT imaging at this time.   After the interventions noted above, I reevaluated the patient and found that they have: improved -vital signs of improved including heart rate (although patient does have tachycardia in the past).  Blood pressure is within normal limits for the patient, slightly on the softer side.  She is no longer tachypneic.  She is well-appearing on reassessment.  Will p.o. challenge prior to discharge.   Dispostion:  After consideration of the diagnostic results and the patients response to treatment, I feel that the patent would benefit from close outpatient follow-up         Final Clinical Impression(s) / ED Diagnoses Final diagnoses:  Nausea vomiting and diarrhea    Rx / DC Orders ED Discharge Orders          Ordered    ondansetron (ZOFRAN-ODT) 4 MG disintegrating tablet  Every 8 hours PRN        10/19/23 1544    dicyclomine (BENTYL) 20 MG tablet  2 times daily PRN        10/19/23 1544              Terald Sleeper, MD 10/19/23 [phone]

## 2023-10-19 NOTE — ED Triage Notes (Signed)
 Pt here for CP that started at 0200 with n/v/right jaw/shoulder pain. EMS gave 4mg  of zofran, 324mg  of aspirin, 1 nitroglycerin. Axox4.

## 2023-10-24 LAB — HM MAMMOGRAPHY

## 2023-10-27 ENCOUNTER — Telehealth: Payer: Self-pay | Admitting: Internal Medicine

## 2023-10-27 NOTE — Telephone Encounter (Signed)
 Copied from CRM [id number]. Topic: Clinical - Lab/Test Results >> Oct 27, 2023  9:26 AM [name] wrote: Reason for CRM: Patient has requested for a follow up call regarding her lab results; Please follow up with patient [phone]

## 2023-10-30 NOTE — Telephone Encounter (Signed)
 Patient was very frustrated and that no one called her about her mammogram to find out that she has cancer and it was jut uploaded to her MyChart with out a personal phone call. I gave my sincerest apologies to the patient and informed her that I did not see a report resulted in regard to her mammogram and she stated that she understood and just wish that she could have gotten a phone call about this but she did reach out to [facility] about her results

## 2023-11-02 ENCOUNTER — Other Ambulatory Visit: Payer: Self-pay | Admitting: Internal Medicine

## 2023-11-03 ENCOUNTER — Other Ambulatory Visit: Payer: Self-pay | Admitting: Radiology

## 2023-11-04 ENCOUNTER — Encounter: Payer: Self-pay | Admitting: Internal Medicine

## 2023-11-04 LAB — SURGICAL PATHOLOGY

## 2023-11-05 NOTE — Telephone Encounter (Signed)
 Spoke to patient to confirm upcoming morning [facility] clinic appointment on 4/19, paperwork will be sent via [facility].  Gave location and time, also informed patient that the surgeon's office would be calling as well to get information from them similar to the packet that they will be receiving so make sure to do both.  Reminded patient that all providers will be coming to the clinic to see them HERE and if they had any questions to not hesitate to reach back out to myself or their navigators.

## 2023-11-17 ENCOUNTER — Encounter: Payer: Self-pay | Admitting: *Deleted

## 2023-11-17 ENCOUNTER — Ambulatory Visit: Payer: Self-pay | Admitting: Surgery

## 2023-11-17 DIAGNOSIS — Z170 Estrogen receptor positive status [ER+]: Secondary | ICD-10-CM | POA: Insufficient documentation

## 2023-11-19 ENCOUNTER — Inpatient Hospital Stay: Attending: Hematology and Oncology | Admitting: Hematology and Oncology

## 2023-11-19 ENCOUNTER — Encounter: Payer: Self-pay | Admitting: Genetic Counselor

## 2023-11-19 ENCOUNTER — Inpatient Hospital Stay: Admitting: Licensed Clinical Social Worker

## 2023-11-19 ENCOUNTER — Encounter: Payer: Self-pay | Admitting: Hematology and Oncology

## 2023-11-19 ENCOUNTER — Ambulatory Visit: Payer: Self-pay | Admitting: Surgery

## 2023-11-19 ENCOUNTER — Ambulatory Visit
Admission: RE | Admit: 2023-11-19 | Discharge: 2023-11-19 | Disposition: A | Discharge status: Home / Self Care | Source: Ambulatory Visit | Attending: Radiation Oncology | Admitting: Radiation Oncology

## 2023-11-19 ENCOUNTER — Encounter: Payer: Self-pay | Admitting: Physical Therapy

## 2023-11-19 ENCOUNTER — Inpatient Hospital Stay: Admitting: Genetic Counselor

## 2023-11-19 ENCOUNTER — Other Ambulatory Visit: Payer: Self-pay

## 2023-11-19 ENCOUNTER — Ambulatory Visit: Attending: Surgery | Admitting: Physical Therapy

## 2023-11-19 ENCOUNTER — Inpatient Hospital Stay: Admitting: *Deleted

## 2023-11-19 VITALS — BP 108/62 | HR 75 | Temp 98.7°F | Resp 16 | Ht 63.39 in | Wt 204.2 lb

## 2023-11-19 DIAGNOSIS — Z1721 Progesterone receptor positive status: Secondary | ICD-10-CM | POA: Insufficient documentation

## 2023-11-19 DIAGNOSIS — Z87891 Personal history of nicotine dependence: Secondary | ICD-10-CM | POA: Diagnosis not present

## 2023-11-19 DIAGNOSIS — Z170 Estrogen receptor positive status [ER+]: Secondary | ICD-10-CM

## 2023-11-19 DIAGNOSIS — Z1732 Human epidermal growth factor receptor 2 negative status: Secondary | ICD-10-CM | POA: Diagnosis not present

## 2023-11-19 DIAGNOSIS — C50411 Malignant neoplasm of upper-outer quadrant of right female breast: Secondary | ICD-10-CM | POA: Diagnosis present

## 2023-11-19 DIAGNOSIS — Z8049 Family history of malignant neoplasm of other genital organs: Secondary | ICD-10-CM

## 2023-11-19 DIAGNOSIS — Z8542 Personal history of malignant neoplasm of other parts of uterus: Secondary | ICD-10-CM

## 2023-11-19 DIAGNOSIS — Z8042 Family history of malignant neoplasm of prostate: Secondary | ICD-10-CM

## 2023-11-19 DIAGNOSIS — R293 Abnormal posture: Secondary | ICD-10-CM | POA: Diagnosis present

## 2023-11-19 DIAGNOSIS — Z800 Family history of malignant neoplasm of digestive organs: Secondary | ICD-10-CM

## 2023-11-19 LAB — CBC WITH DIFFERENTIAL (CANCER CENTER ONLY)
Abs Immature Granulocytes: 0.02 10*3/uL (ref 0.00–0.07)
Basophils Absolute: 0.1 10*3/uL (ref 0.0–0.1)
Basophils Relative: 2 %
Eosinophils Absolute: 0.3 10*3/uL (ref 0.0–0.5)
Eosinophils Relative: 6 %
HCT: 43.5 % (ref 36.0–46.0)
Hemoglobin: 15 g/dL (ref 12.0–15.0)
Immature Granulocytes: 0 %
Lymphocytes Relative: 28 %
Lymphs Abs: 1.5 10*3/uL (ref 0.7–4.0)
MCH: 29.6 pg (ref 26.0–34.0)
MCHC: 34.5 g/dL (ref 30.0–36.0)
MCV: 85.8 fL (ref 80.0–100.0)
Monocytes Absolute: 0.5 10*3/uL (ref 0.1–1.0)
Monocytes Relative: 9 %
Neutro Abs: 3 10*3/uL (ref 1.7–7.7)
Neutrophils Relative %: 55 %
Platelet Count: 201 10*3/uL (ref 150–400)
RBC: 5.07 MIL/uL (ref 3.87–5.11)
RDW: 13.3 % (ref 11.5–15.5)
WBC Count: 5.4 10*3/uL (ref 4.0–10.5)
nRBC: 0 % (ref 0.0–0.2)

## 2023-11-19 LAB — CMP (CANCER CENTER ONLY)
ALT: 26 U/L (ref 0–44)
AST: 21 U/L (ref 15–41)
Albumin: 4.8 g/dL (ref 3.5–5.0)
Alkaline Phosphatase: 75 U/L (ref 38–126)
Anion gap: 7 (ref 5–15)
BUN: 19 mg/dL (ref 8–23)
CO2: 29 mmol/L (ref 22–32)
Calcium: 10.4 mg/dL — ABNORMAL HIGH (ref 8.9–10.3)
Chloride: 103 mmol/L (ref 98–111)
Creatinine: 1.06 mg/dL — ABNORMAL HIGH (ref 0.44–1.00)
GFR, Estimated: 58 mL/min — ABNORMAL LOW (ref >60)
Glucose, Bld: 96 mg/dL (ref 70–99)
Potassium: 4 mmol/L (ref 3.5–5.1)
Sodium: 139 mmol/L (ref 135–145)
Total Bilirubin: 0.6 mg/dL (ref 0.0–1.2)
Total Protein: 7.6 g/dL (ref 6.5–8.1)

## 2023-11-19 LAB — GENETIC SCREENING ORDER

## 2023-11-19 NOTE — Therapy (Signed)
 OUTPATIENT PHYSICAL THERAPY BREAST CANCER BASELINE EVALUATION   Patient Name: [name] MRN: [id number] DOB:07-05-1956, 68 y.o., female Today's Date: 11/19/2023  END OF SESSION:  PT End of Session - 11/19/23 1303     Visit Number 1    Number of Visits 2    Date for PT Re-Evaluation 01/14/24    PT Start Time 0950    PT Stop Time 1000   Also saw pt from 1038-1055 for a total of 27 min   PT Time Calculation (min) 10 min    Activity Tolerance Patient tolerated treatment well    Behavior During Therapy [facility] for tasks assessed/performed             Past Medical History:  Diagnosis Date   Arthritis    neck, knees, ankles, feet, back   Asthma, mild intermittent    prn inhaler   Breast cancer (HCC)    Cervical spondylosis    Chronic dyspnea    per pt gets winded w/ stairs, but recovers quickly   Diverticular disease of colon    followed by dr Loreta Ave (GI)---  chronic w/ chronic diarrhea   Essential hypertension    Fatty infiltration of liver    followed by dr Loreta Ave--- abd ultrasound in epic 08-31-2020   Frequency of urination    History of chest pain    evaluated by cardiologist-- dr Jens Som,  11/ 2017 nuclear stress test suggest ischemia,  06/2016 cardiac cath , showed normal coronary anatomy and lvf   History of COVID-19    per pt in 2019   treated with presumed covid had  mild to moderate symptoms that resolved   History of diverticulitis of colon    05/ 2022   History of TIA (transient ischemic attack) 06/2003   Hyperlipidemia    Hypertension    Limitation of joint motion of neck    per pt due to bone spurs C2-5   Mild intermittent asthma    followed by pcp   PMB (postmenopausal bleeding)    Pre-diabetes    Pulmonary nodule    followed by pcp,  last hest CT in epic 04-21-2018   Stenosis of cervix    SUI (stress urinary incontinence, female)    Thickened endometrium    TMJ syndrome    Wears glasses    Past Surgical History:  Procedure Laterality Date    APPENDECTOMY  1979   BREAST ENHANCEMENT SURGERY Bilateral 1988   per pt implants removed 2002   BUNIONECTOMY Left 07/17/2007   @[facility]    BUNIONECTOMY Right 05/26/2014   Procedure: RIGHT FOOT LAPIDUS BUNION CORRECTION AMD MODIFIED MCBRIDE BUNIONECTOMY;  Surgeon: Toni Arthurs, MD;  Location: [facility];  Service: Orthopedics;  Laterality: Right;   CARDIAC CATHETERIZATION N/A 06/28/2016   Procedure: Left Heart Cath and Coronary Angiography;  Surgeon: Peter M Swaziland, MD;  Location: [facility] INVASIVE CV LAB;  Service: Cardiovascular;  Laterality: N/A;   CESAREAN SECTION  1986   COLONOSCOPY  12/27/2020   by dr Loreta Ave   FINGER SURGERY Left 07/28/2015   @[facility]  :  left thumb "knuckle collasped"  arthroplasty   FOOT HARDWARE REMOVAL  04/25/2016   @[facility] ;   right foot   FOOT SURGERY Right 04/04/2015   @[facility] ;   first and second toe's   GANGLION CYST EXCISION Right 2016   wrist   HYSTEROSCOPY WITH D & C N/A 05/24/2021   Procedure: DILATATION AND CURETTAGE /HYSTEROSCOPY and MyoSure;  Surgeon: Toy Baker, DO;  Location: [facility];  Service: Gynecology;  Laterality: N/A;   NASAL SEPTUM SURGERY  1981   ROTATOR CUFF REPAIR W/ DISTAL CLAVICLE EXCISION Left 08/18/2008   @[facility]  by dr Simonne Come   SHOULDER ARTHROSCOPY W/ LABRAL REPAIR Left 06/07/2008   @[facility] ;  and SAD  by dr Simonne Come   TONSILLECTOMY  1964   TUBAL LIGATION Bilateral 1999   Patient Active Problem List   Diagnosis Date Noted   Malignant neoplasm of upper-outer quadrant of right breast in female, estrogen receptor positive (HCC) 11/17/2023   At risk for Clostridium difficile infection 10/04/2022   Chronic renal disease, stage 2, mildly decreased glomerular filtration rate (GFR) between 60-89 mL/min/1.73 square meter 10/02/2022   Chronic fatigue 10/01/2022   Prediabetes 10/01/2022   Memory loss 07/05/2022   Hot flash, menopausal 10/23/2021   Cough variant asthma 06/19/2021   EIN (endometrial intraepithelial  neoplasia) 06/11/2021   Spondylolisthesis of lumbar region 04/29/2017   Spondylosis without myelopathy or radiculopathy, cervical region 04/29/2017   Pain from implanted hardware 04/04/2016   Morbid obesity (HCC) 03/19/2016   Primary osteoarthritis of first carpometacarpal joint of left hand 07/13/2015   History of TIA (transient ischemic attack) 03/14/2015   Metatarsalgia of right foot 03/08/2015   Routine general medical examination at a health care facility 11/23/2012   GERD 06/14/2009   Essential hypertension 06/16/2008   Hyperlipidemia 12/29/2007    REFERRING PROVIDER: Dr. Manus Rudd  REFERRING DIAG: Right breast cancer  THERAPY DIAG:  Malignant neoplasm of upper-outer quadrant of right breast in female, estrogen receptor positive (HCC)  Abnormal posture  Rationale for Evaluation and Treatment: Rehabilitation  ONSET DATE: 10/24/2023  SUBJECTIVE:                                                                                                                                                                                           SUBJECTIVE STATEMENT: Patient reports she is here today to be seen by her medical team for her newly diagnosed right breast cancer.   PERTINENT HISTORY:  Patient was diagnosed on 10/24/2023 with right grade 2 invasive ductal carcinoma breast cancer. It measures 9 mm and is located in the upper outer quadrant. It is ER/PR positive and HER2 negative with a Ki67 of 20%. She has a history of endometrial cancer in 2022 and a history of a TIA.  PATIENT GOALS:   reduce lymphedema risk and learn post op HEP.   PAIN:  Are you having pain? No  PRECAUTIONS: Active CA   RED FLAGS: None   HAND DOMINANCE: right  WEIGHT BEARING RESTRICTIONS: No  FALLS:  Has patient fallen in last 6  months? 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  COGNITION: Overall cognitive status: Within functional limits for tasks assessed    POSTURE:  Forward head and rounded shoulders posture  UPPER EXTREMITY AROM/PROM:  A/PROM RIGHT   eval   Shoulder extension 57  Shoulder flexion 160  Shoulder abduction 169  Shoulder internal rotation 67  Shoulder external rotation 82    (Blank rows = not tested)  A/PROM LEFT   eval  Shoulder extension 48  Shoulder flexion 137  Shoulder abduction 168  Shoulder internal rotation 57  Shoulder external rotation 75    (Blank rows = not tested)  CERVICAL AROM: All within normal limits  UPPER EXTREMITY STRENGTH: WNL  LYMPHEDEMA ASSESSMENTS (in cm):   LANDMARK RIGHT   eval  10 cm proximal to olecranon process 32.9  Olecranon process 25  10 cm proximal to ulnar styloid process 24.5  Just proximal to ulnar styloid process 16.9  Across hand at thumb web space 19.5  At base of 2nd digit 6.6  (Blank rows = not tested)  LANDMARK LEFT   eval  10 cm proximal to olecranon process 35  Olecranon process 25.3  10 cm proximal to ulnar styloid process 23  Just proximal to ulnar styloid process 16.4  Across hand at thumb web space 18.1  At base of 2nd digit 6.5  (Blank rows = not tested)  L-DEX LYMPHEDEMA SCREENING:  The patient was assessed using the L-Dex machine today to produce a lymphedema index baseline score. The patient will be reassessed on a regular basis (typically every 3 months) to obtain new L-Dex scores. If the score is > 6.5 points away from his/her baseline score indicating onset of subclinical lymphedema, it will be recommended to wear a compression garment for 4 weeks, 12 hours per day and then be reassessed. If the score continues to be > 6.5 points from baseline at reassessment, we will  initiate lymphedema treatment. Assessing in this manner has a 95% rate of preventing clinically significant lymphedema.   L-DEX FLOWSHEETS - 11/19/23 1300       L-DEX LYMPHEDEMA SCREENING   Measurement Type Unilateral    L-DEX MEASUREMENT EXTREMITY Upper Extremity    POSITION  Standing    DOMINANT SIDE Right    At Risk Side Right    BASELINE SCORE (UNILATERAL) 6             QUICK DASH SURVEY:  Cindia Crease - 11/19/23 0001     Open a tight or new jar No difficulty    Do heavy household chores (wash walls, wash floors) Mild difficulty    Carry a shopping bag or briefcase No difficulty    Wash your back No difficulty    Use a knife to cut food No difficulty    Recreational activities in which you take some force or impact through your arm, shoulder, or hand (golf, hammering, tennis) Moderate difficulty    During the past week, to what extent has your arm, shoulder or hand problem interfered with your normal social activities with family, friends, neighbors, or groups? Not at all    During the past week, to what extent has your arm, shoulder or hand problem limited your work or  other regular daily activities Not at all    Arm, shoulder, or hand pain. None    Tingling (pins and needles) in your arm, shoulder, or hand None    Difficulty Sleeping No difficulty    DASH Score 6.82 %              PATIENT EDUCATION:  Education details: Time spent educating patient on aspects of self-care to maximize post op recovery. Patient was educated on where and how to get a post op compression bra to use to reduce post op edema. Patient was also educated on the use of SOZO screenings and surveillance principles for early identification of lymphedema onset. She was instructed to use the post op pillow in the axilla for pressure and pain relief. Patient educated on lymphedema risk reduction and post op shoulder/posture HEP. Person educated: Patient Education method: Explanation, Demonstration,  Handout Education comprehension: Patient verbalized understanding and returned demonstration  HOME EXERCISE PROGRAM: Patient was instructed today in a home exercise program today for post op shoulder range of motion. These included active assist shoulder flexion in sitting, scapular retraction, wall walking with shoulder abduction, and hands behind head external rotation.  She was encouraged to do these twice a day, holding 3 seconds and repeating 5 times when permitted by her physician.   ASSESSMENT:  CLINICAL IMPRESSION: Patient was diagnosed on 10/24/2023 with right grade 2 invasive ductal carcinoma breast cancer. It measures 9 mm and is located in the upper outer quadrant. It is ER/PR positive and HER2 negative with a Ki67 of 20%. Her multidisciplinary medical team met prior to her assessments to determine a recommended treatment plan. She is planning to have a right lumpectomy and sentinel node biopsy followed by Oncotype testing, radiation, and anti-estrogen therapy. She will benefit from a post op PT reassessment to determine needs and from L-Dex screens every 3 months for 2 years to detect subclinical lymphedema. Pt will benefit from skilled therapeutic intervention to improve on the following deficits: Decreased knowledge of precautions, impaired UE functional use, pain, decreased ROM, postural dysfunction.   PT treatment/interventions: ADL/self-care home management, pt/family education, therapeutic exercise  REHAB POTENTIAL: Excellent  CLINICAL DECISION MAKING: Stable/uncomplicated  EVALUATION COMPLEXITY: Low   GOALS: Goals reviewed with patient? YES  LONG TERM GOALS: (STG=LTG)    Name Target Date Goal status  1 Pt will be able to verbalize understanding of pertinent lymphedema risk reduction practices relevant to her dx specifically related to skin care.  Baseline:  No knowledge 11/19/2023 Achieved at eval  2 Pt will be able to return demo and/or verbalize understanding of the  post op HEP related to regaining shoulder ROM. Baseline:  No knowledge 11/19/2023 Achieved at eval  3 Pt will be able to verbalize understanding of the importance of viewing the post op After Breast CA Class video for further lymphedema risk reduction education and therapeutic exercise.  Baseline:  No knowledge 11/19/2023 Achieved at eval  4 Pt will demo she has regained full shoulder ROM and function post operatively compared to baselines.  Baseline: See objective measurements taken today. 01/14/2024     PLAN:  PT FREQUENCY/DURATION: EVAL and 1 follow up appointment.   PLAN FOR NEXT SESSION: will reassess 3-4 weeks post op to determine needs.   Patient will follow up at outpatient cancer rehab 3-4 weeks following surgery.  If the patient requires physical therapy at that time, a specific plan will be dictated and sent to the referring physician for approval. The patient was  educated today on appropriate basic range of motion exercises to begin post operatively and the importance of viewing the After Breast Cancer class video following surgery.  Patient was educated today on lymphedema risk reduction practices as it pertains to recommendations that will benefit the patient immediately following surgery.  She verbalized good understanding.    Physical Therapy Information for After Breast Cancer Surgery/Treatment:  Lymphedema is a swelling condition that you may be at risk for in your arm if you have lymph nodes removed from the armpit area.  After a sentinel node biopsy, the risk is approximately 5-9% and is higher after an axillary node dissection.  There is treatment available for this condition and it is not life-threatening.  Contact your physician or physical therapist with concerns. You may begin the 4 shoulder/posture exercises (see additional sheet) when permitted by your physician (typically a week after surgery).  If you have drains, you may need to wait until those are removed before  beginning range of motion exercises.  A general recommendation is to not lift your arms above shoulder height until drains are removed.  These exercises should be done to your tolerance and gently.  This is not a "no pain/no gain" type of recovery so listen to your body and stretch into the range of motion that you can tolerate, stopping if you have pain.  If you are having immediate reconstruction, ask your plastic surgeon about doing exercises as he or she may want you to wait. We encourage you to attend the free one time ABC (After Breast Cancer) class offered by [facility].  You will learn information related to lymphedema risk, prevention and treatment and additional exercises to regain mobility following surgery.  You can call [phone] for more information.  This is offered the 1st and 3rd Monday of each month.  You only attend the class one time. While undergoing any medical procedure or treatment, try to avoid blood pressure being taken or needle sticks from occurring on the arm on the side of cancer.   This recommendation begins after surgery and continues for the rest of your life.  This may help reduce your risk of getting lymphedema (swelling in your arm). An excellent resource for those seeking information on lymphedema is the National Lymphedema Network's web site. It can be accessed at www.lymphnet.org If you notice swelling in your hand, arm or breast at any time following surgery (even if it is many years from now), please contact your doctor or physical therapist to discuss this.  Lymphedema can be treated at any time but it is easier for you if it is treated early on.  If you feel like your shoulder motion is not returning to normal in a reasonable amount of time, please contact your surgeon or physical therapist.  [facility] [phone]. [address], [address], [place] [address]  ABC CLASS After Breast Cancer  Class  After Breast Cancer Class is a specially designed exercise class video to assist you in a safe recover after having breast cancer surgery.  In this video you will learn how to get back to full function whether your drains were just removed or if you had surgery a month ago. The video can be viewed on this page: [email or web address].boyd-meyer.org/ or on YouTube here: https://[email or web address].  Class Goals  Understand specific stretches to improve the flexibility of you chest and shoulder. Learn ways to safely strengthen your upper body and improve your posture.  Understand the warning signs of infection and why you may be at risk for an arm infection. Learn about Lymphedema and prevention.  ** You do not need to view this video until after surgery.  Drains should be removed to participate in the recommended exercises on the video.  Patient was instructed today in a home exercise program today for post op shoulder range of motion. These included active assist shoulder flexion in sitting, scapular retraction, wall walking with shoulder abduction, and hands behind head external rotation.  She was encouraged to do these twice a day, holding 3 seconds and repeating 5 times when permitted by her physician.  Rollin Clock, [place] 11/19/23 1:12 PM

## 2023-11-19 NOTE — Progress Notes (Signed)
 REFERRING PROVIDER: Rachel Moulds, MD  PRIMARY PROVIDER:  Myrlene Broker, MD  PRIMARY REASON FOR VISIT:  1. Malignant neoplasm of upper-outer quadrant of right breast in female, estrogen receptor positive (HCC)   2. History of uterine cancer   3. Family history of colon cancer   4. Family history of uterine cancer   5. Family history of prostate cancer     HISTORY OF PRESENT ILLNESS:   [name], a 68 y.o. female, was seen for a [place] cancer genetics consultation at the request of Dr. Al Pimple due to a personal and family history of cancer.  [name] presents to clinic today to discuss the possibility of a hereditary predisposition to cancer, to discuss genetic testing, and to further clarify her future cancer risks, as well as potential cancer risks for family members.   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 She also has a history of uterine cancer diagnosed in 2022, MSI/IHC status is unknown.   CANCER HISTORY:  Oncology History  Malignant neoplasm of upper-outer quadrant of right breast in female, estrogen receptor positive (HCC)  10/24/2023 Mammogram   Irregular mass in the right breast which is indeterminate. Additional views with possible [place] are recommended. [place] confirmed 9 * 8 mm mass in the right breast.   11/03/2023 Pathology Results   Right breast needle core biopsy 8 cmfn showed IDC, grade 2  The tumor cells are NEGATIVE for Her2 (1+). Estrogen Receptor:  95%, POSITIVE, STRONG STAINING INTENSITY Progesterone Receptor:  100%, POSITIVE, STRONG STAINING INTENSITY Proliferation Marker Ki67:  20%    11/17/2023 Initial Diagnosis   Malignant neoplasm of upper-outer quadrant of right breast in female, estrogen receptor positive (HCC)   11/19/2023 Cancer Staging   Staging form: Breast, AJCC 8th Edition - Clinical stage from 11/19/2023: Stage IA (cT1b, cN0, cM0, G2, ER+,  PR+, HER2-) - Signed by Rachel Moulds, MD on 11/19/2023 Stage prefix: Initial diagnosis Histologic grading system: 3 grade system     Past Medical History:  Diagnosis Date   Arthritis    neck, knees, ankles, feet, back   Asthma, mild intermittent    prn inhaler   Breast cancer (HCC)    Cervical spondylosis    Chronic dyspnea    per pt gets winded w/ stairs, but recovers quickly   Diverticular disease of colon    followed by dr Loreta Ave (GI)---  chronic w/ chronic diarrhea   Essential hypertension    Fatty infiltration of liver    followed by dr Loreta Ave--- abd ultrasound in epic 08-31-2020   Frequency of urination    History of chest pain    evaluated by cardiologist-- dr Jens Som,  11/ 2017 nuclear stress test suggest ischemia,  06/2016 cardiac cath , showed normal coronary anatomy and lvf   History of COVID-19    per pt in 2019   treated with presumed covid had  mild to moderate symptoms that resolved   History of diverticulitis of colon    05/ 2022   History of TIA (transient ischemic attack) 06/2003   Hyperlipidemia    Hypertension    Limitation of joint motion of neck    per pt due to bone spurs C2-5   Mild intermittent asthma    followed by pcp   PMB (postmenopausal bleeding)    Pre-diabetes    Pulmonary nodule    followed by pcp,  last hest  CT in epic 04-21-2018   Stenosis of cervix    SUI (stress urinary incontinence, female)    Thickened endometrium    TMJ syndrome    Wears glasses     Past Surgical History:  Procedure Laterality Date   APPENDECTOMY  1979   BREAST ENHANCEMENT SURGERY Bilateral 1988   per pt implants removed 2002   BUNIONECTOMY Left 07/17/2007   @[facility]    BUNIONECTOMY Right 05/26/2014   Procedure: RIGHT FOOT LAPIDUS BUNION CORRECTION AMD MODIFIED MCBRIDE BUNIONECTOMY;  Surgeon: Toni Arthurs, MD;  Location: [facility];  Service: Orthopedics;  Laterality: Right;   CARDIAC CATHETERIZATION N/A 06/28/2016   Procedure: Left Heart Cath  and Coronary Angiography;  Surgeon: Peter M Swaziland, MD;  Location: [facility];  Service: Cardiovascular;  Laterality: N/A;   CESAREAN SECTION  1986   COLONOSCOPY  12/27/2020   by dr Loreta Ave   FINGER SURGERY Left 07/28/2015   @[facility]  :  left thumb "knuckle collasped"  arthroplasty   FOOT HARDWARE REMOVAL  04/25/2016   @[facility] ;   right foot   FOOT SURGERY Right 04/04/2015   @[facility] ;   first and second toe's   GANGLION CYST EXCISION Right 2016   wrist   HYSTEROSCOPY WITH D & C N/A 05/24/2021   Procedure: DILATATION AND CURETTAGE /HYSTEROSCOPY and MyoSure;  Surgeon: Toy Baker, DO;  Location: [facility];  Service: Gynecology;  Laterality: N/A;   NASAL SEPTUM SURGERY  1981   ROTATOR CUFF REPAIR W/ DISTAL CLAVICLE EXCISION Left 08/18/2008   @[facility]  by dr Simonne Come   SHOULDER ARTHROSCOPY W/ LABRAL REPAIR Left 06/07/2008   @[facility] ;  and SAD  by dr Simonne Come   TONSILLECTOMY  1964   TUBAL LIGATION Bilateral 1999    Social History   Socioeconomic History   Marital status: Married    Spouse name: Not on file   Number of children: 2   Years of education: Not on file   Highest education level: Not on file  Occupational History   Not on file  Tobacco Use   Smoking status: Former    Current packs/day: 0.00    Types: Cigarettes    Start date: 09/13/1975    Quit date: 09/12/2005    Years since quitting: 18.1   Smokeless tobacco: Never  Vaping Use   Vaping status: Never Used  Substance and Sexual Activity   Alcohol use: Yes    Comment: 1   Drug use: No   Sexual activity: Not on file  Other Topics Concern   Not on file  Social History Narrative   Right hand   Lives with husband   One story home   Does work in Airline pilot   Drinks caffeine   Social Drivers of Corporate investment banker Strain: Not on file  Food Insecurity: Not on file  Transportation Needs: Not on file  Physical Activity: Not on file  Stress: Not on file  Social Connections: Unknown (12/18/2021)    [facility] from [facility]   Social Network    Social Network: Not on file     FAMILY HISTORY:  We obtained a detailed, 4-generation family history.  Significant diagnoses are listed below: Family History  Problem Relation Age of Onset   Lung cancer Mother 77   Diabetes Mother    Thalassemia Mother    Colon polyps Mother    Atrial fibrillation Father    Lung cancer Father 39   Colon cancer Father 65  Bone cancer Father 32   Prostate cancer Father        dx. ?, patient reports all cancers were separate primaries   Thalassemia Sister    Lymphoma Brother 28   Thalassemia Maternal Aunt    Heart disease Paternal Aunt    Colon cancer Paternal Aunt        dx. >50   Uterine cancer Paternal Aunt        dx. >50   Colon cancer Paternal Aunt        dx. >50   Uterine cancer Paternal Aunt        dx. >50   Colon cancer Paternal Grandmother 41 - 80   Colon cancer Cousin 1 - 63       maternal first cousin   Colon cancer Cousin 21 - 64       paternal first cousin   Rectal cancer Neg Hx    Stomach cancer Neg Hx       [name]'s father was diagnosed with lung, colon, and bone cancer at age 83 as well as prostate cancer at an unknown age, she reports all cancers were separate primaries, he died at age 1. She has two paternal aunts and both have a history of uterine and colon cancer diagnosed at unknown ages (>50), they are deceased. One paternal first cousin (the son of an aunt) was diagnosed with colon cancer in his 73s. Her paternal grandmother was diagnosed with colon cancer in her 75s, she is deceased. [name] brother was diagnosed with lymphoma at age 20. Her mother was diagnosed with lung cancer at age 67, she died at age 57. One maternal first cousin was diagnosed with colon cancer in his late 81s. [name] is unaware of previous family history of genetic testing for hereditary cancer risks. There is no reported Ashkenazi Jewish ancestry.   GENETIC COUNSELING  ASSESSMENT: [name] is a 68 y.o. female with a personal and family history of cancer which is somewhat suggestive of a hereditary predisposition to cancer. We, therefore, discussed and recommended the following at today's visit.   DISCUSSION: We discussed that 5 - 10% of cancer is hereditary, with most cases of breast cancer associated with BRCA1/2. Of note, most cases of uterine and colon cancer are associated with Lynch Syndrome.  There are other genes that can be associated with hereditary cancer syndromes.  We discussed that testing is beneficial for several reasons including knowing how to follow individuals after completing their treatment, identifying whether potential treatment options would be beneficial, and understanding if other family members could be at risk for cancer and allowing them to undergo genetic testing.   We reviewed the characteristics, features and inheritance patterns of hereditary cancer syndromes. We also discussed genetic testing, including the appropriate family members to test, the process of testing, insurance coverage and turn-around-time for results. We discussed the implications of a negative, positive, carrier and/or variant of uncertain significant result. In order to get genetic test results in a timely manner so that [name] can use these genetic test results for surgical decisions, we recommended [name] pursue genetic testing for the [employer] Once complete, we recommend [name] pursue reflex genetic testing to a more comprehensive gene panel.   [name] elected to have [facility] CancerNext-Expanded Panel. The CancerNext-Expanded gene panel offered by [facility] and includes sequencing, rearrangement, and RNA analysis for the following 76 genes: AIP, ALK, APC, ATM, AXIN2, BAP1, BARD1, BMPR1A, BRCA1, BRCA2, BRIP1,  CDC73, CDH1, CDK4, CDKN1B, CDKN2A, CEBPA, CHEK2, CTNNA1, DDX41, DICER1, ETV6, FH, FLCN, GATA2, LZTR1, MAX, MBD4, MEN1, MET, MLH1, MSH2, MSH3,  MSH6, MUTYH, NF1, NF2, NTHL1, PALB2, PHOX2B, PMS2, POT1, PRKAR1A, PTCH1, PTEN, RAD51C, RAD51D, RB1, RET, RUNX1, SDHA, SDHAF2, SDHB, SDHC, SDHD, SMAD4, SMARCA4, SMARCB1, SMARCE1, STK11, SUFU, TMEM127, TP53, TSC1, TSC2, VHL, and WT1 (sequencing and deletion/duplication); EGFR, HOXB13, KIT, MITF, PDGFRA, POLD1, and POLE (sequencing only); EPCAM and GREM1 (deletion/duplication only).   Based on [name]'s personal and family history of cancer, she meets medical criteria for genetic testing. Despite that she meets criteria, she may still have an out of pocket cost. We discussed that if her out of pocket cost for testing is over $100, the laboratory should contact them to discuss self-pay prices, patient pay assistance programs, if applicable, and other billing options.  PLAN: After considering the risks, benefits, and limitations, [name] provided informed consent to pursue genetic testing and the blood sample was sent to [facility] for analysis of the CancerNext-Expanded Panel. Results should be available within approximately 2-3 weeks' time, at which point they will be disclosed by telephone to [name], as will any additional recommendations warranted by these results. [name] will receive a summary of her genetic counseling visit and a copy of her results once available. This information will also be available in Epic.   [name] questions were answered to her satisfaction today. Our contact information was provided should additional questions or concerns arise. Thank you for the referral and allowing us  to share in the care of your patient.   Faiz Weber, MS, [facility] Genetic Counselor [email or web address] (P) [phone]  50 minutes were spent on the date of the encounter in service to the patient including preparation, face-to-face consultation, documentation and care coordination.  The patient brought her husband.  Drs. Gudena and/or Maryalice Smaller were available to discuss this case as  needed.   _______________________________________________________________________ For Office Staff:  Number of people involved in session: 2 Was an Intern/ student involved with case: no

## 2023-11-19 NOTE — Progress Notes (Signed)
 [facility] Clinical Social Work  Initial Assessment   [name] is a 68 y.o. year old female accompanied by spouse. Clinical Social Work was referred by  [facility]  for assessment of psychosocial needs.   SDOH (Social Determinants of Health) assessments performed: Yes SDOH Interventions    Flowsheet Row Clinical Support from 11/19/2023 in [facility] [place] Office Visit from 07/08/2018 in [facility]  SDOH Interventions    Food Insecurity Interventions Intervention Not Indicated --  Housing Interventions Intervention Not Indicated --  Transportation Interventions Intervention Not Indicated --  Utilities Interventions Intervention Not Indicated --  Depression Interventions/Treatment  -- Patient refuses Treatment       SDOH Screenings   Food Insecurity: No Food Insecurity (11/19/2023)  Housing: Unknown (11/19/2023)  Transportation Needs: No Transportation Needs (11/19/2023)  Utilities: Not At Risk (11/19/2023)  Depression (PHQ2-9): Low Risk  (11/19/2023)  Social Connections: Unknown (12/18/2021)   Received from [facility]  Tobacco Use: Medium Risk (11/19/2023)   Received from [facility]     Distress Screen completed: No     No data to display            Family/Social Information:  Housing Arrangement: patient lives with spouse Family members/support persons in your life? Husband, Family (daughter [name] 50), grandkids (13, 57, 7, 3, 1) and Friends Transportation concerns: no  Employment: Working full time. Supportive work environment  Income source: Educational psychologist concerns: No Type of concern: None Food access concerns: no Advanced directives: Yes-not on [employer] Currently in place:  [employer]BS  Coping/ Adjustment to diagnosis: Patient understands treatment plan and what happens next? yes, feels better after meeting with the medical team and learning more  about her diagnosis and plan Patient reported stressors: Adjusting to my illness Patient enjoys  sewing/ quilting, walking, listening to music, playing and spending time with her grandkids Current coping skills/ strengths: Ability for insight , Capable of independent living , Motivation for treatment/growth , Special hobby/interest , and Supportive family/friends     SUMMARY: Current SDOH Barriers:  No major barriers identified today  Clinical Social Work Clinical Goal(s):  No clinical social work goals at this time  Interventions: Discussed common feeling and emotions when being diagnosed with cancer, and the importance of support during treatment Informed patient of the support team roles and support services at [facility] Provided CSW contact information and encouraged patient to call with any questions or concerns   Follow Up Plan: Patient will contact CSW with any support or resource needs Patient verbalizes understanding of plan: Yes    Tiare Rohlman E Lucyann Romano, LCSW Clinical Social Worker [facility]

## 2023-11-19 NOTE — Progress Notes (Signed)
 Radiation Oncology         [phone] ________________________________  Multidisciplinary Breast Oncology Clinic [facility]) Initial Outpatient Consultation  Name: [name] MRN: [id number]  Date: 11/19/2023  DOB: [birth year]  NW:[id number], Marjory Signs, MD  Dareen Ebbing, MD   REFERRING PHYSICIAN: Dareen Ebbing, MD  DIAGNOSIS: The encounter diagnosis was Malignant neoplasm of upper-outer quadrant of right breast in female, estrogen receptor positive (HCC).  Stage IA (cT1b, cN0, cM0) Right Breast UOQ, Invasive Ductal Carcinoma, ER+ / PR+ / Her2-, Grade 2    ICD-10-CM   1. Malignant neoplasm of upper-outer quadrant of right breast in female, estrogen receptor positive (HCC)  C50.411    Z17.0       HISTORY OF PRESENT ILLNESS::[name] is a 68 y.o. female who is presenting to the office today for evaluation of her newly diagnosed breast cancer. She is accompanied by with her husband. She is doing well overall.   She had routine screening mammography on 10/24/23 showing a possible abnormality in the right breast. She underwent right diagnostic mammography with tomography and right breast ultrasonography at [facility] on 10/30/23 showing: an indeterminate 0.9 cm mass in the right breast mammographically, and a 0.9 cm irregular mass in the 10 o'clock right breast located 8 cmfn, correlating with mammogram findings. No evidence of right axillary lymphadenopathy was demonstrated.   Biopsy of the right breast mass on 11/03/23 showed: grade 2 invasive ductal carcinoma measuring 2.5 mm in the greatest linear extent of the sample. Prognostic indicators significant for: estrogen receptor, 95% positive and progesterone receptor, 100% positive, both with strong staining intensity. Proliferation marker Ki67 at 20%. HER2 negative.  Menarche: 68 years old Age at first live birth: 68 years old GP: 1 LMP: in 2014 Contraceptive: never used HRT: never used   The patient was referred today for  presentation in the multidisciplinary conference.  Radiology studies and pathology slides were presented there for review and discussion of treatment options.  A consensus was discussed regarding potential next steps.  PREVIOUS RADIATION THERAPY: No  PAST MEDICAL HISTORY: History of endometrial cancer  Past Medical History:  Diagnosis Date   Arthritis    neck, knees, ankles, feet, back   Asthma, mild intermittent    prn inhaler   Breast cancer (HCC)    Cervical spondylosis    Chronic dyspnea    per pt gets winded w/ stairs, but recovers quickly   Diverticular disease of colon    followed by dr Tova Fresh (GI)---  chronic w/ chronic diarrhea   Essential hypertension    Fatty infiltration of liver    followed by dr Tova Fresh--- abd ultrasound in epic 08-31-2020   Frequency of urination    History of chest pain    evaluated by cardiologist-- dr Audery Blazing,  11/ 2017 nuclear stress test suggest ischemia,  06/2016 cardiac cath , showed normal coronary anatomy and lvf   History of COVID-19    per pt in 2019   treated with presumed covid had  mild to moderate symptoms that resolved   History of diverticulitis of colon    05/ 2022   History of TIA (transient ischemic attack) 06/2003   Hyperlipidemia    Hypertension    Limitation of joint motion of neck    per pt due to bone spurs C2-5   Mild intermittent asthma    followed by pcp   PMB (postmenopausal bleeding)    Pre-diabetes    Pulmonary nodule    followed by pcp,  last hest  CT in epic 04-21-2018   Stenosis of cervix    SUI (stress urinary incontinence, female)    Thickened endometrium    TMJ syndrome    Wears glasses     PAST SURGICAL HISTORY: Past Surgical History:  Procedure Laterality Date   APPENDECTOMY  1979   BREAST ENHANCEMENT SURGERY Bilateral 1988   per pt implants removed 2002   BUNIONECTOMY Left 07/17/2007   @[facility]    BUNIONECTOMY Right 05/26/2014   Procedure: RIGHT FOOT LAPIDUS BUNION CORRECTION AMD MODIFIED MCBRIDE  BUNIONECTOMY;  Surgeon: Toni Arthurs, MD;  Location: [facility];  Service: Orthopedics;  Laterality: Right;   CARDIAC CATHETERIZATION N/A 06/28/2016   Procedure: Left Heart Cath and Coronary Angiography;  Surgeon: Peter M Swaziland, MD;  Location: [facility] INVASIVE CV LAB;  Service: Cardiovascular;  Laterality: N/A;   CESAREAN SECTION  1986   COLONOSCOPY  12/27/2020   by dr Loreta Ave   FINGER SURGERY Left 07/28/2015   @[facility]  :  left thumb "knuckle collasped"  arthroplasty   FOOT HARDWARE REMOVAL  04/25/2016   @[facility] ;   right foot   FOOT SURGERY Right 04/04/2015   @[facility] ;   first and second toe's   GANGLION CYST EXCISION Right 2016   wrist   HYSTEROSCOPY WITH D & C N/A 05/24/2021   Procedure: DILATATION AND CURETTAGE /HYSTEROSCOPY and MyoSure;  Surgeon: Toy Baker, DO;  Location: [facility];  Service: Gynecology;  Laterality: N/A;   NASAL SEPTUM SURGERY  1981   ROTATOR CUFF REPAIR W/ DISTAL CLAVICLE EXCISION Left 08/18/2008   @[facility]  by dr Simonne Come   SHOULDER ARTHROSCOPY W/ LABRAL REPAIR Left 06/07/2008   @[facility] ;  and SAD  by dr Simonne Come   TONSILLECTOMY  1964   TUBAL LIGATION Bilateral 1999    FAMILY HISTORY:  Family History  Problem Relation Age of Onset   Lung cancer Mother    Cancer Mother        lung   Diabetes Mother    Thalassemia Mother    Colon polyps Mother    Atrial fibrillation Father    Lung cancer Father    Thalassemia Sister    Cancer Brother        lymphatic   Thalassemia Maternal Aunt    Heart disease Paternal Aunt    Colon cancer Neg Hx    Rectal cancer Neg Hx    Stomach cancer Neg Hx     SOCIAL HISTORY:  Social History   Socioeconomic History   Marital status: Married    Spouse name: Not on file   Number of children: 2   Years of education: Not on file   Highest education level: Not on file  Occupational History   Not on file  Tobacco Use   Smoking status: Former    Current packs/day: 0.00    Types: Cigarettes    Start  date: 09/13/1975    Quit date: 09/12/2005    Years since quitting: 18.1   Smokeless tobacco: Never  Vaping Use   Vaping status: Never Used  Substance and Sexual Activity   Alcohol use: Yes    Comment: 1   Drug use: No   Sexual activity: Not on file  Other Topics Concern   Not on file  Social History Narrative   Right hand   Lives with husband   One story home   Does work in [employer]   Drinks caffeine   Social Drivers of Health   Financial Resource Strain: Not on file  Food Insecurity: Not on file  Transportation Needs: Not on file  Physical Activity: Not on file  Stress: Not on file  Social Connections: Unknown (12/18/2021)   Received from [facility]   Social Network    Social Network: Not on file    ALLERGIES:  Allergies  Allergen Reactions   Doxycycline Diarrhea and Nausea Only    Nausea and diarrhea started a few hours after taking medication.  Diarrhea and nausea continue 2 days after discontinuation.   Hydrocodone Bit-Homatrop Mbr Itching, Anxiety and Other (See Comments)    Made pt confused    Penicillins Shortness Of Breath and Swelling    TONGUE SWELLING Has patient had a PCN reaction causing immediate rash, facial/tongue/throat swelling, SOB or lightheadedness with hypotension: Unknown Has patient had a PCN reaction causing severe rash involving mucus membranes or skin necrosis: Unknown Has patient had a PCN reaction that required hospitalization: Was already in the hospital when happened Has patient had a PCN reaction occurring within the last 10 years: Yes able to take Augmentin If all of the above answers are "NO", then may proc   Promethazine Hcl Shortness Of Breath   Clarithromycin Nausea And Vomiting   Statins Other (See Comments)    LEG CRAMPS   Kiwi Extract Swelling    MEDICATIONS:  Current Outpatient Medications  Medication Sig Dispense Refill   albuterol (PROVENTIL) (2.5 MG/3ML) 0.083% nebulizer solution Take 3 mLs (2.5 mg total)  by nebulization every 6 (six) hours as needed for wheezing or shortness of breath. 150 mL 1   albuterol (VENTOLIN HFA) 108 (90 Base) MCG/ACT inhaler Inhale 1-2 puffs into the lungs every 4 (four) hours as needed for wheezing or shortness of breath. 18 g 0   budesonide-formoterol (SYMBICORT) 80-4.5 MCG/ACT inhaler Take 2 puffs first thing in am and then another 2 puffs about 12 hours later. 1 each 12   citalopram (CELEXA) 10 MG tablet Take 1 tablet (10 mg total) by mouth daily. 90 tablet 3   cyanocobalamin (VITAMIN B12) 1000 MCG tablet Take 1 tablet (1,000 mcg total) by mouth daily. (Patient not taking: Reported on 04/18/2023) 90 tablet 3   dicyclomine (BENTYL) 20 MG tablet Take 1 tablet (20 mg total) by mouth 2 (two) times daily as needed for up to 14 doses for spasms. 14 tablet 0   ezetimibe (ZETIA) 10 MG tablet TAKE 1 TABLET(10 MG) BY MOUTH DAILY 90 tablet 3   famotidine (PEPCID) 40 MG tablet Take 40 mg by mouth 2 (two) times daily. (Patient not taking: Reported on 04/18/2023)     fluticasone (FLONASE) 50 MCG/ACT nasal spray Place 2 sprays into both nostrils 2 (two) times daily as needed.  5   montelukast (SINGULAIR) 10 MG tablet Take 1 tablet (10 mg total) by mouth at bedtime. 90 tablet 3   ondansetron (ZOFRAN-ODT) 4 MG disintegrating tablet Take 1 tablet (4 mg total) by mouth every 8 (eight) hours as needed for up to 15 doses for nausea or vomiting. 15 tablet 0   potassium chloride (KLOR-CON 10) 10 MEQ tablet Take 1 tablet (10 mEq total) by mouth 3 (three) times daily. (Patient not taking: Reported on 04/18/2023) 90 tablet 0   REPATHA SURECLICK 140 MG/ML SOAJ ADMINISTER 1 ML UNDER THE SKIN EVERY 14 DAYS 2 mL 11   tirzepatide (ZEPBOUND) 10 MG/0.5ML Pen Inject 10 mg into the skin once a week. 6 mL 3   Vitamin D, Ergocalciferol, (DRISDOL) 1.25 MG (50000 UNIT) CAPS capsule Take 1  capsule (50,000 Units total) by mouth every 7 (seven) days. 12 capsule 0   No current facility-administered medications for  this encounter.    REVIEW OF SYSTEMS: A 10+ POINT REVIEW OF SYSTEMS WAS OBTAINED including neurology, dermatology, psychiatry, cardiac, respiratory, lymph, extremities, GI, GU, musculoskeletal, constitutional, reproductive, HEENT. On the provided form, she reports weight changes, wearing glasses, and a history of irregular heartbeats. She denies any other symptoms.    PHYSICAL EXAM:     11/19/2023  Vitals with BMI   Height 5' 3.386"   Weight 204 lbs 3 oz   BMI 35.73   Systolic 108   Diastolic 62   Pulse 75    Lungs are clear to auscultation bilaterally. Heart has regular rate and rhythm. No palpable cervical, supraclavicular, or axillary adenopathy. Abdomen soft, non-tender, normal bowel sounds. Breast: Left breast with no palpable mass, nipple discharge, or bleeding. Right breast with a biopsy lesion in the lower outer quadrant with no associated bruising or mass palpated.   KPS = 100  100 - Normal; no complaints; no evidence of disease. 90   - Able to carry on normal activity; minor signs or symptoms of disease. 80   - Normal activity with effort; some signs or symptoms of disease. 55   - Cares for self; unable to carry on normal activity or to do active work. 60   - Requires occasional assistance, but is able to care for most of his personal needs. 50   - Requires considerable assistance and frequent medical care. 40   - Disabled; requires special care and assistance. 30   - Severely disabled; hospital admission is indicated although death not imminent. 20   - Very sick; hospital admission necessary; active supportive treatment necessary. 10   - Moribund; fatal processes progressing rapidly. 0     - Dead  Karnofsky DA, Abelmann WH, Craver LS and Burchenal [facility] [phone]) The use of the nitrogen mustards in the palliative treatment of carcinoma: with particular reference to bronchogenic carcinoma Cancer 1 634-56  LABORATORY DATA:  Lab Results  Component Value Date   WBC 6.9  10/19/2023   HGB 14.8 10/19/2023   HCT 43.6 10/19/2023   MCV 86.5 10/19/2023   PLT 228 10/19/2023   Lab Results  Component Value Date   NA 136 10/19/2023   K 4.3 10/19/2023   CL 100 10/19/2023   CO2 22 10/19/2023   Lab Results  Component Value Date   ALT 15 10/19/2023   AST 28 10/19/2023   ALKPHOS 71 10/19/2023   BILITOT 1.1 10/19/2023    PULMONARY FUNCTION TEST:   Review Flowsheet        No data to display          RADIOGRAPHY: No results found.    IMPRESSION: Stage IA (cT1b, cN0, cM0) Right Breast UOQ, Invasive Ductal Carcinoma, ER+ / PR+ / Her2-, Grade 2  Patient will be a good candidate for breast conservation with radiotherapy to the right breast. We discussed the general course of radiation, potential side effects, and toxicities with radiation and the patient is interested in this approach. Patient understands that if chemotherapy is indicated, this will precede radiation. We will see the patient approximately 4 weeks after her surgery/final chemotherapy treatment. Dr. Eloise Hake anticipates a 4-week course of treatment. We look forward to participating in this patient's care.   PLAN:  Genetics  Right breast lumpectomy with SLN excisions  Oncotype Dx Adjuvant radiation therapy  Aromatase inhibitor    ------------------------------------------------  Julio Ohm, PA-C   Noralee Beam, PhD, MD   [facility]  Radiation Oncology Direct Dial: [phone]  Fax: [phone] [email or web address]    This document serves as a record of services personally performed by Retta Caster, MD and Julio Ohm, PA-C. It was created on his behalf by Aleta Anda, a trained medical scribe. The creation of this record is based on the scribe's personal observations and the provider's statements to them. This document has been checked and approved by the attending provider.

## 2023-11-19 NOTE — H&P (Signed)
 Subjective   Chief Complaint: Breast Cancer   Breast MDC 11/19/23 [place]/ [place]  History of Present Illness: [name] is a 68 y.o. female who is seen today as an office consultation at the request of Dr. Al Pimple for evaluation of Breast Cancer .    This is a 68 year old female with no family history of breast cancer who presents after routine screening mammogram revealed a mass in the right breast.  She is found to have a 0.9 x 0.8 cm mass located at 10:00 in the right breast, 8 cm from the nipple.  Axillary ultrasound was negative.  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  No previous breast surgery.  The patient has an adopted daughter who recently underwent bilateral mastectomies preceded by neoadjuvant chemotherapy.  She presents now with her husband to discuss surgical options.   Review of Systems: A complete review of systems was obtained from the patient.  I have reviewed this information and discussed as appropriate with the patient.  See HPI as well for other ROS.  Review of Systems  Constitutional: Negative.   HENT: Negative.    Eyes: Negative.   Respiratory: Negative.    Cardiovascular: Negative.   Gastrointestinal: Negative.   Genitourinary: Negative.   Musculoskeletal: Negative.   Skin: Negative.   Neurological: Negative.   Endo/Heme/Allergies: Negative.   Psychiatric/Behavioral: Negative.        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   Right foot pain   Metatarsalgia of right foot    Compression of nerve   Primary osteoarthritis of first carpometacarpal joint of left hand   Pain from implanted hardware   Radiculopathy of lumbar region   Spondylosis without myelopathy or radiculopathy, cervical region   Spondylolisthesis of lumbar region   EIN (endometrial intraepithelial neoplasia)   Stroke (CMS/HHS-HCC)   Asthma without status asthmaticus   Invasive ductal carcinoma of breast, female, right (CMS/HHS-HCC)   Malignant neoplasm of upper-outer quadrant of right breast in female, estrogen receptor positive (CMS/HHS-HCC)   Essential hypertension    Past Surgical History:  Procedure Laterality Date   OSTEOTOMY METATARSAL Right 04/04/2015   Procedure: OSTEOTOMY, WITH OR WITHOUT LENGTHENING, SHORTENING OR ANGULAR CORRECTION, METATARSAL; OTHER THAN FIRST METATARSAL, EACH- 2nd metatarsal Weil ;  Surgeon: Cheral Almas, MD;  Location: [facility] OR;  Service: Orthopedics;  Laterality: Right;   NEUROPLASTY NERVE HAND/FOOT Right 04/04/2015   Procedure: NEUROPLASTY NERVE FOOT;  Surgeon: Cheral Almas, MD;  Location: [facility] OR;  Service: Orthopedics;  Laterality: Right;   TRANSPLANT/TRANSFER TENDON FOREARM &/WRIST Left 07/27/2015   Procedure: TENDON TRANSPLANTATION OR TRANSFER, FLEXOR OR EXTENSOR, FOREARM AND/OR WRIST, SINGLE; EACH TENDON;  Surgeon: Kandace Blitz, MD;  Location: [facility] OR;  Service: Orthopedics;  Laterality: Left;   ARTHROPLASTY CARPOMETACARPAL/INTERCARPAL JOINTS Left 07/27/2015   Procedure: ARTHROPLASTY, INTERPOSITION, INTERCARPAL OR CARPOMETACARPAL JOINTS;  Surgeon: Kandace Blitz, MD;  Location: [facility] OR;  Service: Orthopedics;  Laterality: Left;  LAPAROSCOPIC HYSTERECTOMY TOTAL W/REMOVAL TUBES &/OR OVARIES Bilateral 06/26/2021   Procedure: LAPAROSCOPY, SURGICAL; WITH TOTAL HYSTERECTOMY, FOR UTERUS 250 G OR LESS; WITH REMOVAL OF BILATERAL TUBES  AND OVARIES;  Surgeon: Leida Lauth, MD;  Location: [facility] OR;  Service: Gynecology;  Laterality: Bilateral;   PELVIC  EXAMINATION UNDER ANESTHESIA N/A 06/26/2021   Procedure: PELVIC EXAMINATION UNDER ANESTHESIA (OTHER THAN LOCAL);  Surgeon: Leida Lauth, MD;  Location[facility] OR;  Service: Gynecology;  Laterality: N/A;   INJECTION FOR IDENTIFICATION SENTINEL NODE GROIN N/A 06/26/2021   Procedure: INJECTION PROCEDURE; RADIOACTIVE TRACER FOR IDENTIFICATION OF SENTINEL NODE GROIN;  Surgeon: Leida Lauth, MD;  Location: [facility] OR;  Service: Gynecology;  Laterality: N/A;   APPENDECTOMY     ARTHROSCOPY SHOULDER     BREAST SURGERY  1988- augementation   implants removed in 2002?   bunionectomy  Bilateral    CESAREAN SECTION     DILATION AND CURETTAGE OF UTERUS  2022   ENDOMETRIAL ABLATION  2022   FRACTURE SURGERY     Ankle   ganglian cyst     hand   RHINOPLASTY     shoulder tendon repair     TUBAL LIGATION       Allergies  Allergen Reactions   Doxycycline Diarrhea and Nausea    Nausea and diarrhea started a few hours after taking medication.  Diarrhea and nausea continue 2 days after discontinuation.   Penicillins Shortness Of Breath    REACTION: TONGUE SWELLING   Promethazine Hcl Shortness Of Breath    Tongue swelling    Clarithromycin Nausea And Vomiting   Levofloxacin Nausea And Vomiting   Statins-Hmg-Coa Reductase Inhibitors Other (See Comments)    LEG CRAMPS   Hydrocodone Bitartrate Itching and Anxiety   Kiwi (Actinidia Chinensis) Swelling    Current Outpatient Medications on File Prior to Visit  Medication Sig Dispense Refill   albuterol (PROVENTIL) 2.5 mg /3 mL (0.083 %) nebulizer solution Inhale 2.5 mg into the lungs every 6 (six) hours as needed     albuterol MDI, PROVENTIL, VENTOLIN, PROAIR, HFA 90 mcg/actuation inhaler Inhale 2 inhalations into the lungs every 4 (four) hours as needed     ZEPBOUND 10 mg/0.5 mL pen injector Inject 10 mg subcutaneously once a week     albuterol 90 mcg/actuation inhaler Inhale 2 inhalations into the lungs every 6 (six) hours as needed for  Wheezing. Reported on 09/13/2015      alirocumab (PRALUENT PEN) 150 mg/mL PnIj Praluent Pen 150 mg/mL subcutaneous pen injector     celecoxib (CELEBREX) 200 MG capsule Take 200 mg by mouth 2 (two) times daily.     cetirizine (ZYRTEC) 10 MG tablet Take 10 mg by mouth at bedtime     cholecalciferol (VITAMIN D3) 2,000 unit capsule Take 2,000 Units by mouth once daily.     citalopram (CELEXA) 10 MG tablet Take 1 tablet (10 mg total) by mouth once daily 30 tablet 11   ergocalciferol, vitamin D2, 1,250 mcg (50,000 unit) capsule Take 1 capsule (50,000 Units total) by mouth every 7 (seven) days     ezetimibe (ZETIA) 10 mg tablet      losartan (COZAAR) 25 MG tablet 1 tablet (25 mg total) once daily     montelukast (SINGULAIR) 10 mg tablet Take by mouth     oxybutynin (DITROPAN-XL) 5 MG XL tablet Take 1 tablet (5 mg total) by mouth once daily 30 tablet 1   REPATHA SURECLICK 140 mg/mL PnIj  sodium, potassium, and magnesium (SUPREP) oral solution Take by mouth. Reported on 09/13/2015      No current facility-administered medications on file prior to visit.    Family History  Problem Relation Age of Onset   Skin cancer Mother    Obesity Mother    High blood pressure (Hypertension) Mother    Hyperlipidemia (Elevated cholesterol) Mother    Diabetes type I Mother    Lung cancer Mother    Cancer Mother        Lung   COPD Father    Heart disease Father    Stroke Father    Prostate cancer Father    Coronary Artery Disease (Blocked arteries around heart) Father    High blood pressure (Hypertension) Father    Cancer Father        lung   Diabetes Sister    Deep vein thrombosis (DVT or abnormal blood clot formation) Sister    Anesthesia problems Neg Hx      Social History   Tobacco Use  Smoking Status Former   Current packs/day: 0.00   Average packs/day: 2.0 packs/day for 31.0 years (62.0 ttl pk-yrs)   Types: Cigarettes   Start date: 03/05/1976   Quit date: 08/01/2000   Years since  quitting: 23.3  Smokeless Tobacco Never     Social History   Socioeconomic History   Marital status: Married  Tobacco Use   Smoking status: Former    Current packs/day: 0.00    Average packs/day: 2.0 packs/day for 31.0 years (62.0 ttl pk-yrs)    Types: Cigarettes    Start date: 03/05/1976    Quit date: 08/01/2000    Years since quitting: 23.3   Smokeless tobacco: Never  Vaping Use   Vaping status: Never Used  Substance and Sexual Activity   Alcohol use: Yes    Alcohol/week: 1.0 standard drink of alcohol    Types: 1 Glasses of wine per week    Comment: per month   Drug use: No   Sexual activity: Not Currently    Partners: Male    Comment: not in 12 years  Other Topics Concern   Would you please tell us about the people who live in your home, your pets, or anything else important to your social life? Yes    Comment: husband and dog   Social Drivers of Health    Received from [employer], [facility]   Social Network    Objective:    Physical Exam   Constitutional:  WDWN in NAD, conversant, no obvious deformities; lying in bed comfortably Eyes:  Pupils equal, round; sclera anicteric; moist conjunctiva; no lid lag HENT:  Oral mucosa moist; good dentition  Neck:  No masses palpated, trachea midline; no thyromegaly Lungs:  CTA bilaterally; normal respiratory effort Breasts:  symmetric, no nipple changes; no palpable masses or lymphadenopathy on either side CV:  Regular rate and rhythm; no murmurs; extremities well-perfused with no edema Abd:  +bowel sounds, soft, non-tender, no palpable organomegaly; no palpable hernias Musc: Normal gait; no apparent clubbing or cyanosis in extremities Lymphatic:  No palpable cervical or axillary lymphadenopathy Skin:  Warm, dry; no sign of jaundice Psychiatric - alert and oriented x 4; calm mood and affect   Labs, Imaging and Diagnostic Testing: FINAL DIAGNOSIS        1. Breast, right, needle core biopsy, 8 cmfn, 10:30,  ultrasound guided :       INVASIVE MODERATELY DIFFERENTIATED DUCTAL ADENOCARCINOMA, GRADE 2 (3+2+1)  TUBULE FORMATION: SCORE 3       NUCLEAR PLEOMORPHISM: SCORE 2       MITOTIC COUNT: SCORE 1       TOTAL SCORE: 6       OVERALL GRADE: GRADE 2 (6/9)       NEGATIVE FOR ANGIOLYMPHATIC INVASION       NEGATIVE FOR MICROCALCIFICATIONS       TUMOR MEASURES 2.5 MM IN GREATEST LINEAR EXTENT   Results: IMMUNOHISTOCHEMICAL AND MORPHOMETRIC ANALYSIS PERFORMED MANUALLY The tumor cells are NEGATIVE for Her2 (1+). Estrogen Receptor:  95%, POSITIVE, STRONG STAINING INTENSITY Progesterone Receptor:  100%, POSITIVE, STRONG STAINING INTENSITY Proliferation Marker Ki67:  20% REFERENCE RANGE ESTROGEN RECEPTOR NEGATIVE     0% POSITIVE       =>1% REFERENCE RANGE PROGESTERONE RECEPTOR NEGATIVE     0% POSITIVE        =>1% All controls stained appropriately Earleen Glazier, John, Pathologist, Electronic Signature   Assessment and Plan:  Diagnoses and all orders for this visit:  Invasive ductal carcinoma of breast, female, right (CMS/HHS-HCC)    We had a long multidisciplinary discussion with the patient and her husband regarding her treatment options.  This is a relatively small tumor with favorable prognostic indicators.  We discussed mastectomy versus breast conserving therapy.  She is a excellent candidate for breast conserving therapy.  We discussed the pros and cons of pursuing a sentinel lymph node biopsy.  Due to the proximity of her tumor to her axilla and also considering her recent experience with her daughter, the patient prefers to have a sentinel lymph node biopsy. Right breast seed localized lumpectomy/right axillary sentinel lymph node biopsy.  The surgical procedure has been discussed with the patient.  Potential risks, benefits, alternative treatments, and expected outcomes have been explained.  All of the patient's questions at this time have been answered.  The likelihood of reaching the  patient's treatment goal is good.  The patient understands the proposed surgical procedure and wishes to proceed.    Gerhardt Knudsen, MD  11/19/2023 12:03 PM

## 2023-11-19 NOTE — Assessment & Plan Note (Addendum)
 This is a very pleasant 68 year old postmenopausal female patient with newly diagnosed right breast invasive ductal carcinoma, ER/PR strongly staining of 95% and 100% respectively, HER2 1+ Ki-67 20% presenting for breast invasive relation recommendations.  Given small tumor size and favorable prognostics, we discussed about proceeding with Surgery followed by Oncotype DX.  We Have discussed the following details about oncotype DX  We have discussed about Oncotype Dx score which is a well validated prognostic scoring system which can predict outcome with endocrine therapy alone and whether chemotherapy reduces recurrence.  Typically in patients with ER positive cancers that are node negative if the RS score is high typically greater than or equal to 26, chemotherapy is recommended. If chemotherapy is needed, this will precede radiation and then after radiation she will continue on antiestrogen therapy.  We have also discussed the following details about anti estrogen therapy. We have discussed options for antiestrogen therapy today. With regards to Tamoxifen, we discussed that this is a SERM, selective estrogen receptor modulator. We discussed mechanism of action of Tamoxifen, adverse effects on Tamoxifen including but not limited to post menopausal symptoms, increased risk of DVT/PE, increased risk of endometrial cancer, questionable cataracts with long term use and increased risk of cardiovascular events in the study which was not statistically significant. A benefit from Tamoxifen would be improvement in bone density. With regards to aromatase inhibitors, we discussed mechanism of action, adverse effects including but not limited to post menopausal symptoms, arthralgias, myalgias, increased risk of cardiovascular events and bone loss.   She will RTC after surgery to review oncotype DX results and any additional recommendation We will consider first line aromatase inhibitors for anti estrogen  therapy.  Murleen Arms MD

## 2023-11-19 NOTE — Progress Notes (Signed)
 [facility] CONSULT NOTE  Patient Care Team: Myrlene Broker, MD as PCP - General (Internal Medicine) Jens Som Madolyn Frieze, MD as PCP - Cardiology (Cardiology) Donnelly Angelica, RN as Oncology Nurse Navigator Pershing Proud, RN as Oncology Nurse Navigator Manus Rudd, MD as Consulting Physician (General Surgery) Rachel Moulds, MD as Consulting Physician (Hematology and Oncology) Antony Blackbird, MD as Consulting Physician (Radiation Oncology)  CHIEF COMPLAINTS/PURPOSE OF CONSULTATION:  Newly diagnosed breast cancer  HISTORY OF PRESENTING ILLNESS:  [name] 68 y.o. female is here because of recent diagnosis of right breast IDC  I reviewed her records extensively and collaborated the history with the patient.  SUMMARY OF ONCOLOGIC HISTORY: Oncology History  Malignant neoplasm of upper-outer quadrant of right breast in female, estrogen receptor positive (HCC)  10/24/2023 Mammogram   Irregular mass in the right breast which is indeterminate. Additional views with possible [place] are recommended. [place] confirmed 9 * 8 mm mass in the right breast.   11/03/2023 Pathology Results   Right breast needle core biopsy 8 cmfn showed IDC, grade 2  The tumor cells are NEGATIVE for Her2 (1+). Estrogen Receptor:  95%, POSITIVE, STRONG STAINING INTENSITY Progesterone Receptor:  100%, POSITIVE, STRONG STAINING INTENSITY Proliferation Marker Ki67:  20%    11/17/2023 Initial Diagnosis   Malignant neoplasm of upper-outer quadrant of right breast in female, estrogen receptor positive (HCC)   11/19/2023 Cancer Staging   Staging form: Breast, AJCC 8th Edition - Clinical stage from 11/19/2023: Stage IA (cT1b, cN0, cM0, G2, ER+, PR+, HER2-) - Signed by Rachel Moulds, MD on 11/19/2023 Stage prefix: Initial diagnosis Histologic grading system: 3 grade system    Discussed the use of AI scribe software for clinical note transcription with the patient, who gave verbal consent to  proceed.  History of Present Illness  [name] is a 68 year old female with invasive ductal breast cancer who presents for consultation regarding her treatment plan.  She has been diagnosed with invasive ductal breast cancer. The tumor measures 9 millimeters and is classified as stage one. Axillary lymph nodes appear normal on ultrasound. The cancer is strongly estrogen receptor-positive and progesterone receptor-positive, with 95% and 100% positivity, respectively. The KI-67 index is 20%.  She has a history of endometrial cancer, treated with surgery at [facility] four years ago, without the need for chemotherapy. She experiences hot flashes managed with Celexa since her endometrial cancer surgery. She has not undergone a bone density test despite recommendations.  Her current medications include losartan, hydrochlorothiazide, ezetimibe, and Repatha for blood pressure and cholesterol management. She experiences seasonal allergies and asthma, which exacerbate her symptoms during certain times of the year.  She has a family history of breast cancer, with a daughter ( not biologic) who underwent a double mastectomy. She has another daughter who is an Technical sales engineer and lives locally. No abnormal findings on previous mammograms, although she has been called back for additional imaging in the past. No palpable lumps prior to her recent diagnosis.   MEDICAL HISTORY:  Past Medical History:  Diagnosis Date   Arthritis    neck, knees, ankles, feet, back   Asthma, mild intermittent    prn inhaler   Breast cancer (HCC)    Cervical spondylosis    Chronic dyspnea    per pt gets winded w/ stairs, but recovers quickly   Diverticular disease of colon    followed by dr Loreta Ave (GI)---  chronic w/ chronic diarrhea   Essential hypertension    Fatty infiltration  of liver    followed by dr Loreta Ave--- abd ultrasound in epic 08-31-2020   Frequency of urination    History of chest pain    evaluated by  cardiologist-- dr Jens Som,  11/ 2017 nuclear stress test suggest ischemia,  06/2016 cardiac cath , showed normal coronary anatomy and lvf   History of COVID-19    per pt in 2019   treated with presumed covid had  mild to moderate symptoms that resolved   History of diverticulitis of colon    05/ 2022   History of TIA (transient ischemic attack) 06/2003   Hyperlipidemia    Hypertension    Limitation of joint motion of neck    per pt due to bone spurs C2-5   Mild intermittent asthma    followed by pcp   PMB (postmenopausal bleeding)    Pre-diabetes    Pulmonary nodule    followed by pcp,  last hest CT in epic 04-21-2018   Stenosis of cervix    SUI (stress urinary incontinence, female)    Thickened endometrium    TMJ syndrome    Wears glasses     SURGICAL HISTORY: Past Surgical History:  Procedure Laterality Date   APPENDECTOMY  1979   BREAST ENHANCEMENT SURGERY Bilateral 1988   per pt implants removed 2002   BUNIONECTOMY Left 07/17/2007   @[facility]    BUNIONECTOMY Right 05/26/2014   Procedure: RIGHT FOOT LAPIDUS BUNION CORRECTION AMD MODIFIED MCBRIDE BUNIONECTOMY;  Surgeon: Toni Arthurs, MD;  Location: [facility];  Service: Orthopedics;  Laterality: Right;   CARDIAC CATHETERIZATION N/A 06/28/2016   Procedure: Left Heart Cath and Coronary Angiography;  Surgeon: Peter M Swaziland, MD;  Location: [facility] INVASIVE CV LAB;  Service: Cardiovascular;  Laterality: N/A;   CESAREAN SECTION  1986   COLONOSCOPY  12/27/2020   by dr Loreta Ave   FINGER SURGERY Left 07/28/2015   @[facility]  :  left thumb "knuckle collasped"  arthroplasty   FOOT HARDWARE REMOVAL  04/25/2016   @[facility] ;   right foot   FOOT SURGERY Right 04/04/2015   @[facility] ;   first and second toe's   GANGLION CYST EXCISION Right 2016   wrist   HYSTEROSCOPY WITH D & C N/A 05/24/2021   Procedure: DILATATION AND CURETTAGE /HYSTEROSCOPY and MyoSure;  Surgeon: Toy Baker, DO;  Location: [facility];  Service:  Gynecology;  Laterality: N/A;   NASAL SEPTUM SURGERY  1981   ROTATOR CUFF REPAIR W/ DISTAL CLAVICLE EXCISION Left 08/18/2008   @[facility]  by dr Simonne Come   SHOULDER ARTHROSCOPY W/ LABRAL REPAIR Left 06/07/2008   @[facility] ;  and SAD  by dr Simonne Come   TONSILLECTOMY  1964   TUBAL LIGATION Bilateral 1999    SOCIAL HISTORY: Social History   Socioeconomic History   Marital status: Married    Spouse name: Not on file   Number of children: 2   Years of education: Not on file   Highest education level: Not on file  Occupational History   Not on file  Tobacco Use   Smoking status: Former    Current packs/day: 0.00    Types: Cigarettes    Start date: 09/13/1975    Quit date: 09/12/2005    Years since quitting: 18.1   Smokeless tobacco: Never  Vaping Use   Vaping status: Never Used  Substance and Sexual Activity   Alcohol use: Yes    Comment: 1   Drug use: No   Sexual activity: Not on file  Other Topics Concern  Not on file  Social History Narrative   Right hand   Lives with husband   One story home   Does work in Air[employer]   Drinks caffeine   Social Drivers of Health   Financial Resource Strain: Not on file  Food Insecurity: Not on file  Transportation Needs: Not on file  Physical Activity: Not on file  Stress: Not on file  Social Connections: Unknown (12/18/2021)   Received from [facility]   Social Network    Social Network: Not on file  Intimate Partner Violence: Unknown (11/09/2021)   Received from [facility]   HITS    Physically Hurt: Not on file    Insult or Talk Down To: Not on file    Threaten Physical Harm: Not on file    Scream or Curse: Not on file    FAMILY HISTORY: Family History  Problem Relation Age of Onset   Lung cancer Mother    Cancer Mother        lung   Diabetes Mother    Thalassemia Mother    Colon polyps Mother    Atrial fibrillation Father    Lung cancer Father    Thalassemia Sister    Cancer Brother         lymphatic   Thalassemia Maternal Aunt    Heart disease Paternal Aunt    Colon cancer Neg Hx    Rectal cancer Neg Hx    Stomach cancer Neg Hx     ALLERGIES:  is allergic to doxycycline, hydrocodone bit-homatrop mbr, penicillins, promethazine hcl, clarithromycin, statins, and kiwi extract.  MEDICATIONS:  Current Outpatient Medications  Medication Sig Dispense Refill   albuterol (PROVENTIL) (2.5 MG/3ML) 0.083% nebulizer solution Take 3 mLs (2.5 mg total) by nebulization every 6 (six) hours as needed for wheezing or shortness of breath. 150 mL 1   albuterol (VENTOLIN HFA) 108 (90 Base) MCG/ACT inhaler Inhale 1-2 puffs into the lungs every 4 (four) hours as needed for wheezing or shortness of breath. 18 g 0   budesonide-formoterol (SYMBICORT) 80-4.5 MCG/ACT inhaler Take 2 puffs first thing in am and then another 2 puffs about 12 hours later. 1 each 12   citalopram (CELEXA) 10 MG tablet Take 1 tablet (10 mg total) by mouth daily. 90 tablet 3   cyanocobalamin (VITAMIN B12) 1000 MCG tablet Take 1 tablet (1,000 mcg total) by mouth daily. (Patient not taking: Reported on 04/18/2023) 90 tablet 3   dicyclomine (BENTYL) 20 MG tablet Take 1 tablet (20 mg total) by mouth 2 (two) times daily as needed for up to 14 doses for spasms. 14 tablet 0   ezetimibe (ZETIA) 10 MG tablet TAKE 1 TABLET(10 MG) BY MOUTH DAILY 90 tablet 3   famotidine (PEPCID) 40 MG tablet Take 40 mg by mouth 2 (two) times daily. (Patient not taking: Reported on 04/18/2023)     fluticasone (FLONASE) 50 MCG/ACT nasal spray Place 2 sprays into both nostrils 2 (two) times daily as needed.  5   montelukast (SINGULAIR) 10 MG tablet Take 1 tablet (10 mg total) by mouth at bedtime. 90 tablet 3   ondansetron (ZOFRAN-ODT) 4 MG disintegrating tablet Take 1 tablet (4 mg total) by mouth every 8 (eight) hours as needed for up to 15 doses for nausea or vomiting. 15 tablet 0   potassium chloride (KLOR-CON 10) 10 MEQ tablet Take 1 tablet (10 mEq total) by  mouth 3 (three) times daily. (Patient not taking: Reported on 04/18/2023) 90  tablet 0   REPATHA SURECLICK 140 MG/ML SOAJ ADMINISTER 1 ML UNDER THE SKIN EVERY 14 DAYS 2 mL 11   tirzepatide (ZEPBOUND) 10 MG/0.5ML Pen Inject 10 mg into the skin once a week. 6 mL 3   Vitamin D, Ergocalciferol, (DRISDOL) 1.25 MG (50000 UNIT) CAPS capsule Take 1 capsule (50,000 Units total) by mouth every 7 (seven) days. 12 capsule 0   No current facility-administered medications for this visit.    REVIEW OF SYSTEMS:   Constitutional: Denies fevers, chills or abnormal night sweats Eyes: Denies blurriness of vision, double vision or watery eyes Ears, nose, mouth, throat, and face: Denies mucositis or sore throat Respiratory: Denies cough, dyspnea or wheezes Cardiovascular: Denies palpitation, chest discomfort or lower extremity swelling Gastrointestinal:  Denies nausea, heartburn or change in bowel habits Skin: Denies abnormal skin rashes Lymphatics: Denies new lymphadenopathy or easy bruising Neurological:Denies numbness, tingling or new weaknesses Behavioral/Psych: Mood is stable, no new changes  Breast: Denies any palpable lumps or discharge All other systems were reviewed with the patient and are negative.  PHYSICAL EXAMINATION: ECOG PERFORMANCE STATUS: 0 - Asymptomatic  Vitals:   11/19/23 0830  BP: 108/62  Pulse: 75  Resp: 16  Temp: 98.7 F (37.1 C)  SpO2: 95%   Filed Weights   11/19/23 0830  Weight: 204 lb 3.2 oz (92.6 kg)    GENERAL:alert, no distress and comfortable Post biopsy changes No palpable mass in the right breast No regional adenopathy  LABORATORY DATA:  I have reviewed the data as listed Lab Results  Component Value Date   WBC 6.9 10/19/2023   HGB 14.8 10/19/2023   HCT 43.6 10/19/2023   MCV 86.5 10/19/2023   PLT 228 10/19/2023   Lab Results  Component Value Date   NA 136 10/19/2023   K 4.3 10/19/2023   CL 100 10/19/2023   CO2 22 10/19/2023    RADIOGRAPHIC  STUDIES: I have personally reviewed the radiological reports and agreed with the findings in the report.  ASSESSMENT AND PLAN:  Malignant neoplasm of upper-outer quadrant of right breast in female, estrogen receptor positive (HCC) This is a very pleasant 68 year old postmenopausal female patient with newly diagnosed right breast invasive ductal carcinoma, ER/PR strongly staining of 95% and 100% respectively, HER2 1+ Ki-67 20% presenting for breast invasive relation recommendations.  Given small tumor size and favorable prognostics, we discussed about proceeding with Surgery followed by Oncotype DX.  We Have discussed the following details about oncotype DX  We have discussed about Oncotype Dx score which is a well validated prognostic scoring system which can predict outcome with endocrine therapy alone and whether chemotherapy reduces recurrence.  Typically in patients with ER positive cancers that are node negative if the RS score is high typically greater than or equal to 26, chemotherapy is recommended. If chemotherapy is needed, this will precede radiation and then after radiation she will continue on antiestrogen therapy.  We have also discussed the following details about anti estrogen therapy. We have discussed options for antiestrogen therapy today. With regards to Tamoxifen, we discussed that this is a SERM, selective estrogen receptor modulator. We discussed mechanism of action of Tamoxifen, adverse effects on Tamoxifen including but not limited to post menopausal symptoms, increased risk of DVT/PE, increased risk of endometrial cancer, questionable cataracts with long term use and increased risk of cardiovascular events in the study which was not statistically significant. A benefit from Tamoxifen would be improvement in bone density. With regards to aromatase inhibitors, we discussed  mechanism of action, adverse effects including but not limited to post menopausal symptoms, arthralgias,  myalgias, increased risk of cardiovascular events and bone loss.   She will RTC after surgery to review oncotype DX results and any additional recommendation We will consider first line aromatase inhibitors for anti estrogen therapy.  Murleen Arms MD        All questions were answered. The patient knows to call the clinic with any problems, questions or concerns.    Murleen Arms, MD 11/19/23

## 2023-11-19 NOTE — H&P (View-Only) (Signed)
 Subjective   Chief Complaint: Breast Cancer   Breast MDC 11/19/23 [place]/ [place]  History of Present Illness: [name] is a 68 y.o. female who is seen today as an office consultation at the request of Dr. Al Pimple for evaluation of Breast Cancer .    This is a 68 year old female with no family history of breast cancer who presents after routine screening mammogram revealed a mass in the right breast.  She is found to have a 0.9 x 0.8 cm mass located at 10:00 in the right breast, 8 cm from the nipple.  Axillary ultrasound was negative.  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  No previous breast surgery.  The patient has an adopted daughter who recently underwent bilateral mastectomies preceded by neoadjuvant chemotherapy.  She presents now with her husband to discuss surgical options.   Review of Systems: A complete review of systems was obtained from the patient.  I have reviewed this information and discussed as appropriate with the patient.  See HPI as well for other ROS.  Review of Systems  Constitutional: Negative.   HENT: Negative.    Eyes: Negative.   Respiratory: Negative.    Cardiovascular: Negative.   Gastrointestinal: Negative.   Genitourinary: Negative.   Musculoskeletal: Negative.   Skin: Negative.   Neurological: Negative.   Endo/Heme/Allergies: Negative.   Psychiatric/Behavioral: Negative.        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   Right foot pain   Metatarsalgia of right foot    Compression of nerve   Primary osteoarthritis of first carpometacarpal joint of left hand   Pain from implanted hardware   Radiculopathy of lumbar region   Spondylosis without myelopathy or radiculopathy, cervical region   Spondylolisthesis of lumbar region   EIN (endometrial intraepithelial neoplasia)   Stroke (CMS/HHS-HCC)   Asthma without status asthmaticus   Invasive ductal carcinoma of breast, female, right (CMS/HHS-HCC)   Malignant neoplasm of upper-outer quadrant of right breast in female, estrogen receptor positive (CMS/HHS-HCC)   Essential hypertension    Past Surgical History:  Procedure Laterality Date   OSTEOTOMY METATARSAL Right 04/04/2015   Procedure: OSTEOTOMY, WITH OR WITHOUT LENGTHENING, SHORTENING OR ANGULAR CORRECTION, METATARSAL; OTHER THAN FIRST METATARSAL, EACH- 2nd metatarsal Weil ;  Surgeon: Cheral Almas, MD;  [facility];  Service: Orthopedics;  Laterality: Right;   NEUROPLASTY NERVE HAND/FOOT Right 04/04/2015   Procedure: NEUROPLASTY NERVE FOOT;  Surgeon: Cheral Almas, MD;  Location: [facility];  Service: Orthopedics;  Laterality: Right;   TRANSPLANT/TRANSFER TENDON FOREARM &/WRIST Left 07/27/2015   Procedure: TENDON TRANSPLANTATION OR TRANSFER, FLEXOR OR EXTENSOR, FOREARM AND/OR WRIST, SINGLE; EACH TENDON;  Surgeon: Kandace Blitz, MD;  Location: [facility];  Service: Orthopedics;  Laterality: Left;   ARTHROPLASTY CARPOMETACARPAL/INTERCARPAL JOINTS Left 07/27/2015   Procedure: ARTHROPLASTY, INTERPOSITION, INTERCARPAL OR CARPOMETACARPAL JOINTS;  Surgeon: Kandace Blitz, MD;  Location: [facility];  Service: Orthopedics;  Laterality: Left;  LAPAROSCOPIC HYSTERECTOMY TOTAL W/REMOVAL TUBES &/OR OVARIES Bilateral 06/26/2021   Procedure: LAPAROSCOPY, SURGICAL; WITH TOTAL HYSTERECTOMY, FOR UTERUS 250 G OR LESS; WITH REMOVAL OF BILATERAL TUBES  AND OVARIES;  Surgeon: Leida Lauth, MD;  Location: [facility] OR;  Service: Gynecology;  Laterality: Bilateral;   PELVIC  EXAMINATION UNDER ANESTHESIA N/A 06/26/2021   Procedure: PELVIC EXAMINATION UNDER ANESTHESIA (OTHER THAN LOCAL);  Surgeon: Leida Lauth, MD;  Location[facility] OR;  Service: Gynecology;  Laterality: N/A;   INJECTION FOR IDENTIFICATION SENTINEL NODE GROIN N/A 06/26/2021   Procedure: INJECTION PROCEDURE; RADIOACTIVE TRACER FOR IDENTIFICATION OF SENTINEL NODE GROIN;  Surgeon: Leida Lauth, MD;  Location: [facility] OR;  Service: Gynecology;  Laterality: N/A;   APPENDECTOMY     ARTHROSCOPY SHOULDER     BREAST SURGERY  1988- augementation   implants removed in 2002?   bunionectomy  Bilateral    CESAREAN SECTION     DILATION AND CURETTAGE OF UTERUS  2022   ENDOMETRIAL ABLATION  2022   FRACTURE SURGERY     Ankle   ganglian cyst     hand   RHINOPLASTY     shoulder tendon repair     TUBAL LIGATION       Allergies  Allergen Reactions   Doxycycline Diarrhea and Nausea    Nausea and diarrhea started a few hours after taking medication.  Diarrhea and nausea continue 2 days after discontinuation.   Penicillins Shortness Of Breath    REACTION: TONGUE SWELLING   Promethazine Hcl Shortness Of Breath    Tongue swelling    Clarithromycin Nausea And Vomiting   Levofloxacin Nausea And Vomiting   Statins-Hmg-Coa Reductase Inhibitors Other (See Comments)    LEG CRAMPS   Hydrocodone Bitartrate Itching and Anxiety   Kiwi (Actinidia Chinensis) Swelling    Current Outpatient Medications on File Prior to Visit  Medication Sig Dispense Refill   albuterol (PROVENTIL) 2.5 mg /3 mL (0.083 %) nebulizer solution Inhale 2.5 mg into the lungs every 6 (six) hours as needed     albuterol MDI, PROVENTIL, VENTOLIN, PROAIR, HFA 90 mcg/actuation inhaler Inhale 2 inhalations into the lungs every 4 (four) hours as needed     ZEPBOUND 10 mg/0.5 mL pen injector Inject 10 mg subcutaneously once a week     albuterol 90 mcg/actuation inhaler Inhale 2 inhalations into the lungs every 6 (six) hours as needed for  Wheezing. Reported on 09/13/2015      alirocumab (PRALUENT PEN) 150 mg/mL PnIj Praluent Pen 150 mg/mL subcutaneous pen injector     celecoxib (CELEBREX) 200 MG capsule Take 200 mg by mouth 2 (two) times daily.     cetirizine (ZYRTEC) 10 MG tablet Take 10 mg by mouth at bedtime     cholecalciferol (VITAMIN D3) 2,000 unit capsule Take 2,000 Units by mouth once daily.     citalopram (CELEXA) 10 MG tablet Take 1 tablet (10 mg total) by mouth once daily 30 tablet 11   ergocalciferol, vitamin D2, 1,250 mcg (50,000 unit) capsule Take 1 capsule (50,000 Units total) by mouth every 7 (seven) days     ezetimibe (ZETIA) 10 mg tablet      losartan (COZAAR) 25 MG tablet 1 tablet (25 mg total) once daily     montelukast (SINGULAIR) 10 mg tablet Take by mouth     oxybutynin (DITROPAN-XL) 5 MG XL tablet Take 1 tablet (5 mg total) by mouth once daily 30 tablet 1   REPATHA SURECLICK 140 mg/mL PnIj  sodium, potassium, and magnesium (SUPREP) oral solution Take by mouth. Reported on 09/13/2015      No current facility-administered medications on file prior to visit.    Family History  Problem Relation Age of Onset   Skin cancer Mother    Obesity Mother    High blood pressure (Hypertension) Mother    Hyperlipidemia (Elevated cholesterol) Mother    Diabetes type I Mother    Lung cancer Mother    Cancer Mother        Lung   COPD Father    Heart disease Father    Stroke Father    Prostate cancer Father    Coronary Artery Disease (Blocked arteries around heart) Father    High blood pressure (Hypertension) Father    Cancer Father        lung   Diabetes Sister    Deep vein thrombosis (DVT or abnormal blood clot formation) Sister    Anesthesia problems Neg Hx      Social History   Tobacco Use  Smoking Status Former   Current packs/day: 0.00   Average packs/day: 2.0 packs/day for 31.0 years (62.0 ttl pk-yrs)   Types: Cigarettes   Start date: 03/05/1976   Quit date: 08/01/2000   Years since  quitting: 23.3  Smokeless Tobacco Never     Social History   Socioeconomic History   Marital status: Married  Tobacco Use   Smoking status: Former    Current packs/day: 0.00    Average packs/day: 2.0 packs/day for 31.0 years (62.0 ttl pk-yrs)    Types: Cigarettes    Start date: 03/05/1976    Quit date: 08/01/2000    Years since quitting: 23.3   Smokeless tobacco: Never  Vaping Use   Vaping status: Never Used  Substance and Sexual Activity   Alcohol use: Yes    Alcohol/week: 1.0 standard drink of alcohol    Types: 1 Glasses of wine per week    Comment: per month   Drug use: No   Sexual activity: Not Currently    Partners: Male    Comment: not in 12 years  Other Topics Concern   Would you please tell us about the people who live in your home, your pets, or anything else important to your social life? Yes    Comment: husband and dog   Social Drivers of Health    Received from [employer], [facility]   Social Network    Objective:    Physical Exam   Constitutional:  WDWN in NAD, conversant, no obvious deformities; lying in bed comfortably Eyes:  Pupils equal, round; sclera anicteric; moist conjunctiva; no lid lag HENT:  Oral mucosa moist; good dentition  Neck:  No masses palpated, trachea midline; no thyromegaly Lungs:  CTA bilaterally; normal respiratory effort Breasts:  symmetric, no nipple changes; no palpable masses or lymphadenopathy on either side CV:  Regular rate and rhythm; no murmurs; extremities well-perfused with no edema Abd:  +bowel sounds, soft, non-tender, no palpable organomegaly; no palpable hernias Musc: Normal gait; no apparent clubbing or cyanosis in extremities Lymphatic:  No palpable cervical or axillary lymphadenopathy Skin:  Warm, dry; no sign of jaundice Psychiatric - alert and oriented x 4; calm mood and affect   Labs, Imaging and Diagnostic Testing: FINAL DIAGNOSIS        1. Breast, right, needle core biopsy, 8 cmfn, 10:30,  ultrasound guided :       INVASIVE MODERATELY DIFFERENTIATED DUCTAL ADENOCARCINOMA, GRADE 2 (3+2+1)  TUBULE FORMATION: SCORE 3       NUCLEAR PLEOMORPHISM: SCORE 2       MITOTIC COUNT: SCORE 1       TOTAL SCORE: 6       OVERALL GRADE: GRADE 2 (6/9)       NEGATIVE FOR ANGIOLYMPHATIC INVASION       NEGATIVE FOR MICROCALCIFICATIONS       TUMOR MEASURES 2.5 MM IN GREATEST LINEAR EXTENT   Results: IMMUNOHISTOCHEMICAL AND MORPHOMETRIC ANALYSIS PERFORMED MANUALLY The tumor cells are NEGATIVE for Her2 (1+). Estrogen Receptor:  95%, POSITIVE, STRONG STAINING INTENSITY Progesterone Receptor:  100%, POSITIVE, STRONG STAINING INTENSITY Proliferation Marker Ki67:  20% REFERENCE RANGE ESTROGEN RECEPTOR NEGATIVE     0% POSITIVE       =>1% REFERENCE RANGE PROGESTERONE RECEPTOR NEGATIVE     0% POSITIVE        =>1% All controls stained appropriately Earleen Glazier, John, Pathologist, Electronic Signature   Assessment and Plan:  Diagnoses and all orders for this visit:  Invasive ductal carcinoma of breast, female, right (CMS/HHS-HCC)    We had a long multidisciplinary discussion with the patient and her husband regarding her treatment options.  This is a relatively small tumor with favorable prognostic indicators.  We discussed mastectomy versus breast conserving therapy.  She is a excellent candidate for breast conserving therapy.  We discussed the pros and cons of pursuing a sentinel lymph node biopsy.  Due to the proximity of her tumor to her axilla and also considering her recent experience with her daughter, the patient prefers to have a sentinel lymph node biopsy. Right breast seed localized lumpectomy/right axillary sentinel lymph node biopsy.  The surgical procedure has been discussed with the patient.  Potential risks, benefits, alternative treatments, and expected outcomes have been explained.  All of the patient's questions at this time have been answered.  The likelihood of reaching the  patient's treatment goal is good.  The patient understands the proposed surgical procedure and wishes to proceed.    Gerhardt Knudsen, MD  11/19/2023 12:03 PM

## 2023-11-20 ENCOUNTER — Encounter (HOSPITAL_BASED_OUTPATIENT_CLINIC_OR_DEPARTMENT_OTHER): Payer: Self-pay | Admitting: Surgery

## 2023-11-20 ENCOUNTER — Telehealth: Payer: Self-pay | Admitting: *Deleted

## 2023-11-20 ENCOUNTER — Other Ambulatory Visit: Payer: Self-pay

## 2023-11-20 ENCOUNTER — Encounter: Payer: Self-pay | Admitting: *Deleted

## 2023-11-20 NOTE — Telephone Encounter (Signed)
 Left vm regarding [facility] from 11/19/23. Contact information provided for questions or needs.

## 2023-11-21 ENCOUNTER — Other Ambulatory Visit: Payer: Self-pay | Admitting: Internal Medicine

## 2023-11-23 ENCOUNTER — Other Ambulatory Visit: Payer: Self-pay | Admitting: Cardiology

## 2023-11-24 ENCOUNTER — Telehealth: Payer: Self-pay | Admitting: Hematology and Oncology

## 2023-11-24 NOTE — Telephone Encounter (Signed)
 Patient scheduled appointments. Patient is aware of all appointment details. [name] asked about the release of her genetic counseling results. I suggested transferring her to the Nurse on duty. [name] stated that she is okay with asking at her next appointment.

## 2023-11-25 ENCOUNTER — Telehealth: Payer: Self-pay | Admitting: *Deleted

## 2023-11-25 ENCOUNTER — Encounter: Payer: Self-pay | Admitting: Hematology and Oncology

## 2023-11-25 NOTE — Telephone Encounter (Signed)
 Spoke to pt regarding the need for f/u appt with Dr. Arno Bibles. Discussed the difference b/t oncotype testing and what Ki67 means on path report. Pt with better understanding. Denies further questions or needs at this time. Confirmed sx date and f/u with Dr. Arno Bibles

## 2023-11-26 ENCOUNTER — Ambulatory Visit: Payer: Self-pay | Admitting: Genetic Counselor

## 2023-11-26 ENCOUNTER — Telehealth: Payer: Self-pay | Admitting: Genetic Counselor

## 2023-11-26 ENCOUNTER — Encounter: Payer: Self-pay | Admitting: Genetic Counselor

## 2023-11-26 DIAGNOSIS — Z1379 Encounter for other screening for genetic and chromosomal anomalies: Secondary | ICD-10-CM | POA: Insufficient documentation

## 2023-11-26 NOTE — Progress Notes (Signed)
 HPI:   [name] was previously seen in the [facility] due to a personal and family history of cancer and concerns regarding a hereditary predisposition to cancer. Please refer to our prior cancer genetics clinic note for more information regarding our discussion, assessment and recommendations, at the time. [name] recent genetic test results were disclosed to her, as were recommendations warranted by these results. These results and recommendations are discussed in more detail below.  CANCER HISTORY:  Oncology History  Malignant neoplasm of upper-outer quadrant of right breast in female, estrogen receptor positive (HCC)  10/24/2023 Mammogram   Irregular mass in the right breast which is indeterminate. Additional views with possible US  are recommended. US  confirmed 9 * 8 mm mass in the right breast.   11/03/2023 Pathology Results   Right breast needle core biopsy 8 cmfn showed IDC, grade 2  The tumor cells are NEGATIVE for Her2 (1+). Estrogen Receptor:  95%, POSITIVE, STRONG STAINING INTENSITY Progesterone Receptor:  100%, POSITIVE, STRONG STAINING INTENSITY Proliferation Marker Ki67:  20%    11/17/2023 Initial Diagnosis   Malignant neoplasm of upper-outer quadrant of right breast in female, estrogen receptor positive (HCC)   11/19/2023 Cancer Staging   Staging form: Breast, AJCC 8th Edition - Clinical stage from 11/19/2023: Stage IA (cT1b, cN0, cM0, G2, ER+, PR+, HER2-) - Signed by Murleen Arms, MD on 11/19/2023 Stage prefix: Initial diagnosis Histologic grading system: 3 grade system    Genetic Testing   [facility] CancerNext-Expanded Panel+RNA was Negative. Report date is 11/25/2023.   The CancerNext-Expanded gene panel offered by [facility] and includes sequencing, rearrangement, and RNA analysis for the following 76 genes: AIP, ALK, APC, ATM, AXIN2, BAP1, BARD1, BMPR1A, BRCA1, BRCA2, BRIP1, CDC73, CDH1, CDK4, CDKN1B, CDKN2A, CEBPA, CHEK2, CTNNA1, DDX41,  DICER1, ETV6, FH, FLCN, GATA2, LZTR1, MAX, MBD4, MEN1, MET, MLH1, MSH2, MSH3, MSH6, MUTYH, NF1, NF2, NTHL1, PALB2, PHOX2B, PMS2, POT1, PRKAR1A, PTCH1, PTEN, RAD51C, RAD51D, RB1, RET, RUNX1, SDHA, SDHAF2, SDHB, SDHC, SDHD, SMAD4, SMARCA4, SMARCB1, SMARCE1, STK11, SUFU, TMEM127, TP53, TSC1, TSC2, VHL, and WT1 (sequencing and deletion/duplication); EGFR, HOXB13, KIT, MITF, PDGFRA, POLD1, and POLE (sequencing only); EPCAM and GREM1 (deletion/duplication only).       FAMILY HISTORY:  We obtained a detailed, 4-generation family history.  Significant diagnoses are listed below:      Family History  Problem Relation Age of Onset   Lung cancer Mother 7   Diabetes Mother     Thalassemia Mother     Colon polyps Mother     Atrial fibrillation Father     Lung cancer Father 58   Colon cancer Father 31   Bone cancer Father 55   Prostate cancer Father          dx. ?, patient reports all cancers were separate primaries   Thalassemia Sister     Lymphoma Brother 31   Thalassemia Maternal Aunt     Heart disease Paternal Aunt     Colon cancer Paternal Aunt          dx. >50   Uterine cancer Paternal Aunt          dx. >50   Colon cancer Paternal Aunt          dx. >50   Uterine cancer Paternal Aunt          dx. >50   Colon cancer Paternal Grandmother 56 - 60   Colon cancer Cousin 102 - 18        maternal  first cousin   Colon cancer Cousin 11 - 65        paternal first cousin   Rectal cancer Neg Hx     Stomach cancer Neg Hx               [name] father was diagnosed with lung, colon, and bone cancer at age 36 as well as prostate cancer at an unknown age, she reports all cancers were separate primaries, he died at age 26. She has two paternal aunts and both have a history of uterine and colon cancer diagnosed at unknown ages (>50), they are deceased. One paternal first cousin (the son of an aunt) was diagnosed with colon cancer in his 80s. Her paternal grandmother was diagnosed with colon cancer  in her 2s, she is deceased. [name] brother was diagnosed with lymphoma at age 77. Her mother was diagnosed with lung cancer at age 25, she died at age 84. One maternal first cousin was diagnosed with colon cancer in his late 18s. [name] is unaware of previous family history of genetic testing for hereditary cancer risks. There is no reported Ashkenazi Jewish ancestry.   GENETIC TEST RESULTS:  The [facility] CancerNext-Expanded Panel found no pathogenic mutations.   The CancerNext-Expanded gene panel offered by [facility] and includes sequencing, rearrangement, and RNA analysis for the following 76 genes: AIP, ALK, APC, ATM, AXIN2, BAP1, BARD1, BMPR1A, BRCA1, BRCA2, BRIP1, CDC73, CDH1, CDK4, CDKN1B, CDKN2A, CEBPA, CHEK2, CTNNA1, DDX41, DICER1, ETV6, FH, FLCN, GATA2, LZTR1, MAX, MBD4, MEN1, MET, MLH1, MSH2, MSH3, MSH6, MUTYH, NF1, NF2, NTHL1, PALB2, PHOX2B, PMS2, POT1, PRKAR1A, PTCH1, PTEN, RAD51C, RAD51D, RB1, RET, RUNX1, SDHA, SDHAF2, SDHB, SDHC, SDHD, SMAD4, SMARCA4, SMARCB1, SMARCE1, STK11, SUFU, TMEM127, TP53, TSC1, TSC2, VHL, and WT1 (sequencing and deletion/duplication); EGFR, HOXB13, KIT, MITF, PDGFRA, POLD1, and POLE (sequencing only); EPCAM and GREM1 (deletion/duplication only).    The test report has been scanned into EPIC and is located under the Molecular Pathology section of the Results Review tab.  A portion of the result report is included below for reference. Genetic testing reported out on 11/25/2023.       Even though a pathogenic variant was not identified, possible explanations for the cancer in the family may include: There may be no hereditary risk for cancer in the family. The cancers in [name] and/or her family may be due to other genetic or environmental factors. There may be a gene mutation in one of these genes that current testing methods cannot detect, but that chance is small. There could be another gene that has not yet been discovered, or that we have not yet  tested, that is responsible for the cancer diagnoses in the family.  It is also possible there is a hereditary cause for the cancer in the family that Ms. Warga did not inherit.  Therefore, it is important to remain in touch with cancer genetics in the future so that we can continue to offer Ms. Bail the most up to date genetic testing.   ADDITIONAL GENETIC TESTING:  We discussed with Ms. Rue that her genetic testing was fairly extensive.  If there are genes identified to increase cancer risk that can be analyzed in the future, we would be happy to discuss and coordinate this testing at that time.    CANCER SCREENING RECOMMENDATIONS:  Ms. Crance test result is considered negative (normal).  This means that we have not identified a hereditary cause for her personal and family history of cancer  at this time.   An individual's cancer risk and medical management are not determined by genetic test results alone. Overall cancer risk assessment incorporates additional factors, including personal medical history, family history, and any available genetic information that may result in a personalized plan for cancer prevention and surveillance. Therefore, it is recommended she continue to follow the cancer management and screening guidelines provided by her oncology and primary healthcare provider.  Based on the reported personal and family history, specific cancer screenings for Ms. Peg Bouton and her family include:  Colon Cancer Screening: Due to Ms. Moening's father's history of colon cancer, she is recommended to repeat colonoscopies every 5 years. More frequent colonoscopies may be recommended if polyps are identified.  RECOMMENDATIONS FOR FAMILY MEMBERS:   Since she did not inherit a mutation in a cancer predisposition gene included on this panel, her daughter could not have inherited a mutation from her in one of these genes. Individuals in this family might be at some increased risk of  developing cancer, over the general population risk, due to the family history of cancer. We recommend women in this family have a yearly mammogram beginning at age 52, or 5 years younger than the earliest onset of cancer, an annual clinical breast exam, and perform monthly breast self-exams.  Other members of the family may still carry a pathogenic variant in one of these genes that Ms. Craddock did not inherit. Based on the family history, we recommend her siblings have genetic counseling and testing.   FOLLOW-UP:  Cancer genetics is a rapidly advancing field and it is possible that new genetic tests will be appropriate for her and/or her family members in the future. We encouraged her to remain in contact with cancer genetics on an annual basis so we can update her personal and family histories and let her know of advances in cancer genetics that may benefit this family.   Our contact number was provided. Ms. Sheek questions were answered to her satisfaction, and she knows she is welcome to call us  at anytime with additional questions or concerns.   Kreed Kauffman, MS, [facility] Genetic Counselor [email or web address] (P) [phone]

## 2023-11-26 NOTE — Telephone Encounter (Signed)
 I contacted Ms. Wildasin to discuss her genetic testing results. No pathogenic variants were identified in the 76 genes analyzed. Detailed clinic note to follow.  The test report has been scanned into EPIC and is located under the Molecular Pathology section of the Results Review tab.  A portion of the result report is included below for reference.   Nicolena Schurman, MS, [facility] Genetic Counselor [email or web address] (P) [phone]

## 2023-11-27 ENCOUNTER — Ambulatory Visit (HOSPITAL_COMMUNITY)
Admission: RE | Admit: 2023-11-27 | Discharge: 2023-11-27 | Disposition: A | Discharge status: Home / Self Care | Source: Ambulatory Visit | Attending: Surgery | Admitting: Surgery

## 2023-11-27 ENCOUNTER — Ambulatory Visit (HOSPITAL_BASED_OUTPATIENT_CLINIC_OR_DEPARTMENT_OTHER): Admitting: Anesthesiology

## 2023-11-27 ENCOUNTER — Other Ambulatory Visit: Payer: Self-pay

## 2023-11-27 ENCOUNTER — Encounter (HOSPITAL_BASED_OUTPATIENT_CLINIC_OR_DEPARTMENT_OTHER): Payer: Self-pay | Admitting: Surgery

## 2023-11-27 ENCOUNTER — Ambulatory Visit (HOSPITAL_BASED_OUTPATIENT_CLINIC_OR_DEPARTMENT_OTHER)
Admission: RE | Admit: 2023-11-27 | Discharge: 2023-11-27 | Disposition: A | Discharge status: Home / Self Care | Attending: Surgery | Admitting: Surgery

## 2023-11-27 ENCOUNTER — Encounter (HOSPITAL_BASED_OUTPATIENT_CLINIC_OR_DEPARTMENT_OTHER)
Admission: RE | Disposition: A | Discharge status: Home / Self Care | Payer: Self-pay | Source: Home / Self Care | Attending: Surgery

## 2023-11-27 DIAGNOSIS — C50411 Malignant neoplasm of upper-outer quadrant of right female breast: Secondary | ICD-10-CM

## 2023-11-27 DIAGNOSIS — Z79899 Other long term (current) drug therapy: Secondary | ICD-10-CM | POA: Insufficient documentation

## 2023-11-27 DIAGNOSIS — Z1721 Progesterone receptor positive status: Secondary | ICD-10-CM | POA: Diagnosis not present

## 2023-11-27 DIAGNOSIS — Z1732 Human epidermal growth factor receptor 2 negative status: Secondary | ICD-10-CM | POA: Diagnosis not present

## 2023-11-27 DIAGNOSIS — E669 Obesity, unspecified: Secondary | ICD-10-CM | POA: Insufficient documentation

## 2023-11-27 DIAGNOSIS — Z6836 Body mass index (BMI) 36.0-36.9, adult: Secondary | ICD-10-CM | POA: Insufficient documentation

## 2023-11-27 DIAGNOSIS — Z87891 Personal history of nicotine dependence: Secondary | ICD-10-CM | POA: Diagnosis not present

## 2023-11-27 DIAGNOSIS — I10 Essential (primary) hypertension: Secondary | ICD-10-CM | POA: Diagnosis not present

## 2023-11-27 DIAGNOSIS — Z170 Estrogen receptor positive status [ER+]: Secondary | ICD-10-CM | POA: Insufficient documentation

## 2023-11-27 DIAGNOSIS — C50911 Malignant neoplasm of unspecified site of right female breast: Secondary | ICD-10-CM | POA: Insufficient documentation

## 2023-11-27 DIAGNOSIS — E785 Hyperlipidemia, unspecified: Secondary | ICD-10-CM | POA: Insufficient documentation

## 2023-11-27 DIAGNOSIS — J45909 Unspecified asthma, uncomplicated: Secondary | ICD-10-CM | POA: Insufficient documentation

## 2023-11-27 HISTORY — PX: RADIOACTIVE SEED GUIDED AXILLARY SENTINEL LYMPH NODE: SHX6735

## 2023-11-27 SURGERY — RADIOACTIVE SEED GUIDED AXILLARY SENTINEL LYMPH NODE BIOPSY
Anesthesia: Regional | Site: Breast | Laterality: Right

## 2023-11-27 MED ORDER — ACETAMINOPHEN 500 MG PO TABS
1000.0000 mg | ORAL_TABLET | ORAL | Status: AC
  Administered 2023-11-27: 1000 mg via ORAL

## 2023-11-27 MED ORDER — BUPIVACAINE-EPINEPHRINE 0.25% -1:200000 IJ SOLN
INTRAMUSCULAR | Status: DC | PRN
  Administered 2023-11-27: 10 mL

## 2023-11-27 MED ORDER — AMISULPRIDE (ANTIEMETIC) 5 MG/2ML IV SOLN: 10.0000 mg | Freq: Once | INTRAVENOUS | Status: DC | PRN

## 2023-11-27 MED ORDER — CHLORHEXIDINE GLUCONATE CLOTH 2 % EX PADS
6.0000 | MEDICATED_PAD | Freq: Once | CUTANEOUS | Status: AC
  Administered 2023-11-27: 6 via TOPICAL

## 2023-11-27 MED ORDER — ROPIVACAINE HCL 5 MG/ML IJ SOLN
INTRAMUSCULAR | Status: DC | PRN
  Administered 2023-11-27: 30 mL via PERINEURAL

## 2023-11-27 MED ORDER — OXYCODONE HCL 5 MG/5ML PO SOLN: 5.0000 mg | Freq: Once | ORAL | Status: DC | PRN

## 2023-11-27 MED ORDER — VANCOMYCIN HCL IN DEXTROSE 1-5 GM/200ML-% IV SOLN
1000.0000 mg | INTRAVENOUS | Status: AC
  Administered 2023-11-27: 1000 mg via INTRAVENOUS

## 2023-11-27 MED ORDER — FENTANYL CITRATE (PF) 100 MCG/2ML IJ SOLN
INTRAMUSCULAR | Status: AC
  Filled 2023-11-27: qty 2

## 2023-11-27 MED ORDER — DIPHENHYDRAMINE HCL 50 MG/ML IJ SOLN: 12.5000 mg | Freq: Once | INTRAMUSCULAR | Status: DC

## 2023-11-27 MED ORDER — MIDAZOLAM HCL 2 MG/2ML IJ SOLN
INTRAMUSCULAR | Status: AC
  Filled 2023-11-27: qty 2

## 2023-11-27 MED ORDER — DIPHENHYDRAMINE HCL 50 MG/ML IJ SOLN
INTRAMUSCULAR | Status: AC
  Filled 2023-11-27: qty 1

## 2023-11-27 MED ORDER — LACTATED RINGERS IV SOLN: INTRAVENOUS | Status: DC

## 2023-11-27 MED ORDER — FENTANYL CITRATE (PF) 100 MCG/2ML IJ SOLN: 25.0000 ug | INTRAMUSCULAR | Status: DC | PRN

## 2023-11-27 MED ORDER — CHLORHEXIDINE GLUCONATE CLOTH 2 % EX PADS
6.0000 | MEDICATED_PAD | Freq: Once | CUTANEOUS | Status: DC
Start: 2023-11-27 — End: 2023-11-27

## 2023-11-27 MED ORDER — VANCOMYCIN HCL 1000 MG IV SOLR
INTRAVENOUS | Status: DC | PRN
  Administered 2023-11-27: 1000 mg via INTRAVENOUS

## 2023-11-27 MED ORDER — PROPOFOL 10 MG/ML IV BOLUS
INTRAVENOUS | Status: DC | PRN
  Administered 2023-11-27: 150 mg via INTRAVENOUS

## 2023-11-27 MED ORDER — EPHEDRINE SULFATE (PRESSORS) 50 MG/ML IJ SOLN
INTRAMUSCULAR | Status: DC | PRN
  Administered 2023-11-27 (×3): 5 mg via INTRAVENOUS

## 2023-11-27 MED ORDER — ONDANSETRON HCL 4 MG/2ML IJ SOLN
INTRAMUSCULAR | Status: DC | PRN
  Administered 2023-11-27: 4 mg via INTRAVENOUS

## 2023-11-27 MED ORDER — TECHNETIUM TC 99M TILMANOCEPT KIT
1.0000 | PACK | Freq: Once | INTRAVENOUS | Status: AC | PRN
  Administered 2023-11-27: 1 via INTRADERMAL

## 2023-11-27 MED ORDER — DEXAMETHASONE SODIUM PHOSPHATE 10 MG/ML IJ SOLN
INTRAMUSCULAR | Status: DC | PRN
  Administered 2023-11-27: 10 mg

## 2023-11-27 MED ORDER — LIDOCAINE HCL (CARDIAC) PF 100 MG/5ML IV SOSY
PREFILLED_SYRINGE | INTRAVENOUS | Status: DC | PRN
  Administered 2023-11-27: 20 mg via INTRAVENOUS

## 2023-11-27 MED ORDER — PHENYLEPHRINE HCL (PRESSORS) 10 MG/ML IV SOLN
INTRAVENOUS | Status: DC | PRN
  Administered 2023-11-27 (×2): 80 ug via INTRAVENOUS
  Administered 2023-11-27: 160 ug via INTRAVENOUS
  Administered 2023-11-27: 80 ug via INTRAVENOUS

## 2023-11-27 MED ORDER — OXYCODONE HCL 5 MG PO TABS: 5.0000 mg | ORAL_TABLET | Freq: Once | ORAL | Status: DC | PRN

## 2023-11-27 MED ORDER — ONDANSETRON HCL 4 MG/2ML IJ SOLN
INTRAMUSCULAR | Status: AC
  Filled 2023-11-27: qty 2

## 2023-11-27 MED ORDER — ACETAMINOPHEN 500 MG PO TABS
ORAL_TABLET | ORAL | Status: AC
  Filled 2023-11-27: qty 2

## 2023-11-27 MED ORDER — TRAMADOL HCL 50 MG PO TABS: 50.0000 mg | ORAL_TABLET | Freq: Four times a day (QID) | ORAL | 0 refills | Status: DC | PRN

## 2023-11-27 MED ORDER — DEXAMETHASONE SODIUM PHOSPHATE 4 MG/ML IJ SOLN
INTRAMUSCULAR | Status: DC | PRN
  Administered 2023-11-27: 4 mg via INTRAVENOUS

## 2023-11-27 MED ORDER — MAGTRACE LYMPHATIC TRACER
INTRAMUSCULAR | Status: DC | PRN
  Administered 2023-11-27: 2 mL via INTRAMUSCULAR

## 2023-11-27 MED ORDER — LIDOCAINE 2% (20 MG/ML) 5 ML SYRINGE
INTRAMUSCULAR | Status: AC
  Filled 2023-11-27: qty 5

## 2023-11-27 MED ORDER — FENTANYL CITRATE (PF) 100 MCG/2ML IJ SOLN
100.0000 ug | Freq: Once | INTRAMUSCULAR | Status: AC
  Administered 2023-11-27: 100 ug via INTRAVENOUS

## 2023-11-27 MED ORDER — PROPOFOL 10 MG/ML IV BOLUS
INTRAVENOUS | Status: AC
  Filled 2023-11-27: qty 20

## 2023-11-27 MED ORDER — VANCOMYCIN HCL IN DEXTROSE 1-5 GM/200ML-% IV SOLN
INTRAVENOUS | Status: AC
  Filled 2023-11-27: qty 200

## 2023-11-27 SURGICAL SUPPLY — 51 items
BENZOIN TINCTURE PRP APPL 2/3 (GAUZE/BANDAGES/DRESSINGS) ×1 IMPLANT
BINDER BREAST 3XL (GAUZE/BANDAGES/DRESSINGS) IMPLANT
BINDER BREAST LRG (GAUZE/BANDAGES/DRESSINGS) IMPLANT
BINDER BREAST MEDIUM (GAUZE/BANDAGES/DRESSINGS) IMPLANT
BINDER BREAST XLRG (GAUZE/BANDAGES/DRESSINGS) IMPLANT
BINDER BREAST XXLRG (GAUZE/BANDAGES/DRESSINGS) IMPLANT
BLADE CLIPPER SURG (BLADE) IMPLANT
BLADE HEX COATED 2.75 (ELECTRODE) ×1 IMPLANT
BLADE SURG 10 STRL SS (BLADE) IMPLANT
BLADE SURG 15 STRL LF DISP TIS (BLADE) ×1 IMPLANT
CANISTER SUC SOCK COL 7IN (MISCELLANEOUS) ×1 IMPLANT
CANISTER SUCT 1200ML W/VALVE (MISCELLANEOUS) IMPLANT
CHLORAPREP W/TINT 26 (MISCELLANEOUS) ×1 IMPLANT
CLIP APPLIE 9.375 MED OPEN (MISCELLANEOUS) IMPLANT
COVER BACK TABLE 60X90IN (DRAPES) ×1 IMPLANT
COVER MAYO STAND STRL (DRAPES) ×1 IMPLANT
COVER PROBE CYLINDRICAL 5X96 (MISCELLANEOUS) ×1 IMPLANT
DRAPE LAPAROSCOPIC ABDOMINAL (DRAPES) ×1 IMPLANT
DRAPE UTILITY XL STRL (DRAPES) ×1 IMPLANT
DRSG TEGADERM 4X4.75 (GAUZE/BANDAGES/DRESSINGS) ×2 IMPLANT
ELECTRODE REM PT RTRN 9FT ADLT (ELECTROSURGICAL) ×1 IMPLANT
GAUZE SPONGE 4X4 12PLY STRL LF (GAUZE/BANDAGES/DRESSINGS) IMPLANT
GLOVE BIO SURGEON STRL SZ7 (GLOVE) ×1 IMPLANT
GLOVE BIOGEL PI IND STRL 7.0 (GLOVE) IMPLANT
GLOVE BIOGEL PI IND STRL 7.5 (GLOVE) ×1 IMPLANT
GLOVE SURG SS PI 7.0 STRL IVOR (GLOVE) IMPLANT
GLOVE SURG SYN 7.5 E (GLOVE) ×2 IMPLANT
GLOVE SURG SYN 7.5 PF PI (GLOVE) IMPLANT
GOWN STRL REUS W/ TWL LRG LVL3 (GOWN DISPOSABLE) ×2 IMPLANT
GOWN STRL REUS W/ TWL XL LVL3 (GOWN DISPOSABLE) IMPLANT
KIT MARKER MARGIN INK (KITS) ×1 IMPLANT
NDL HYPO 25X1 1.5 SAFETY (NEEDLE) ×2 IMPLANT
NDL SAFETY ECLIPSE 18X1.5 (NEEDLE) ×1 IMPLANT
NEEDLE HYPO 25X1 1.5 SAFETY (NEEDLE) ×2 IMPLANT
NS IRRIG 1000ML POUR BTL (IV SOLUTION) IMPLANT
PACK BASIN DAY SURGERY FS (CUSTOM PROCEDURE TRAY) ×1 IMPLANT
PENCIL SMOKE EVACUATOR (MISCELLANEOUS) ×1 IMPLANT
SLEEVE SCD COMPRESS KNEE MED (STOCKING) ×1 IMPLANT
SPIKE FLUID TRANSFER (MISCELLANEOUS) IMPLANT
SPONGE T-LAP 4X18 ~~LOC~~+RFID (SPONGE) ×1 IMPLANT
STRIP CLOSURE SKIN 1/2X4 (GAUZE/BANDAGES/DRESSINGS) ×1 IMPLANT
SUT MON AB 4-0 PC3 18 (SUTURE) ×1 IMPLANT
SUT SILK 2 0 SH (SUTURE) IMPLANT
SUT VIC AB 3-0 SH 27X BRD (SUTURE) ×1 IMPLANT
SYR BULB EAR ULCER 3OZ GRN STR (SYRINGE) ×1 IMPLANT
SYR CONTROL 10ML LL (SYRINGE) ×2 IMPLANT
TOWEL GREEN STERILE FF (TOWEL DISPOSABLE) ×1 IMPLANT
TRACER MAGTRACE VIAL (MISCELLANEOUS) IMPLANT
TRAY FAXITRON CT DISP (TRAY / TRAY PROCEDURE) ×1 IMPLANT
TUBE CONNECTING 20X1/4 (TUBING) ×1 IMPLANT
YANKAUER SUCT BULB TIP NO VENT (SUCTIONS) IMPLANT

## 2023-11-27 NOTE — Discharge Instructions (Addendum)
 [facility] [employer] Office Phone Number [phone]  BREAST BIOPSY/ PARTIAL MASTECTOMY: POST OP INSTRUCTIONS  Always review your discharge instruction sheet given to you by the facility where your surgery was performed.  IF YOU HAVE DISABILITY OR FAMILY LEAVE FORMS, YOU MUST BRING THEM TO THE OFFICE FOR PROCESSING.  DO NOT GIVE THEM TO YOUR DOCTOR.  A prescription for pain medication may be given to you upon discharge.  Take your pain medication as prescribed, if needed.  If narcotic pain medicine is not needed, then you may take acetaminophen  (Tylenol ) or ibuprofen  (Advil ) as needed. Take your usually prescribed medications unless otherwise directed If you need a refill on your pain medication, please contact your pharmacy.  They will contact our office to request authorization.  Prescriptions will not be filled after 5pm or on week-ends. You should eat very light the first 24 hours after surgery, such as soup, crackers, pudding, etc.  Resume your normal diet the day after surgery. Most patients will experience some swelling and bruising in the breast.  Ice packs and a good support bra will help.  Swelling and bruising can take several days to resolve.  It is common to experience some constipation if taking pain medication after surgery.  Increasing fluid intake and taking a stool softener will usually help or prevent this problem from occurring.  A mild laxative (Milk of Magnesia or Miralax) should be taken according to package directions if there are no bowel movements after 48 hours. Unless discharge instructions indicate otherwise, you may remove your bandages 24-48 hours after surgery, and you may shower at that time.  You may have steri-strips (small skin tapes) in place directly over the incision.  These strips should be left on the skin for 7-10 days.  If your surgeon used skin glue on the incision, you may shower in 24 hours.  The glue will flake off over the next 2-3 weeks.  Any  sutures or staples will be removed at the office during your follow-up visit. ACTIVITIES:  You may resume regular daily activities (gradually increasing) beginning the next day.  Wearing a good support bra or sports bra minimizes pain and swelling.  You may have sexual intercourse when it is comfortable. You may drive when you no longer are taking prescription pain medication, you can comfortably wear a seatbelt, and you can safely maneuver your car and apply brakes. RETURN TO WORK:  ______________________________________________________________________________________ [name] should see your doctor in the office for a follow-up appointment approximately two weeks after your surgery.  Your doctor's nurse will typically make your follow-up appointment when she calls you with your pathology report.  Expect your pathology report 2-3 business days after your surgery.  You may call to check if you do not hear from us  after three days. OTHER INSTRUCTIONS: _______________________________________________________________________________________________ _____________________________________________________________________________________________________________________________________ _____________________________________________________________________________________________________________________________________ _____________________________________________________________________________________________________________________________________  WHEN TO CALL YOUR DOCTOR: Fever over 101.0 Nausea and/or vomiting. Extreme swelling or bruising. Continued bleeding from incision. Increased pain, redness, or drainage from the incision.  The clinic staff is available to answer your questions during regular business hours.  Please don't hesitate to call and ask to speak to one of the nurses for clinical concerns.  If you have a medical emergency, go to the nearest emergency room or call 911.  A surgeon from [facility] is always on call at the hospital.  For further questions, please visit [email or web address]     Post Anesthesia Home Care Instructions  Activity: Get plenty of rest for the remainder  of the day. A responsible individual must stay with you for 24 hours following the procedure.  For the next 24 hours, DO NOT: -Drive a car -Advertising copywriter -Drink alcoholic beverages -Take any medication unless instructed by your physician -Make any legal decisions or sign important papers.  Meals: Start with liquid foods such as gelatin or soup. Progress to regular foods as tolerated. Avoid greasy, spicy, heavy foods. If nausea and/or vomiting occur, drink only clear liquids until the nausea and/or vomiting subsides. Call your physician if vomiting continues.  Special Instructions/Symptoms: Your throat may feel dry or sore from the anesthesia or the breathing tube placed in your throat during surgery. If this causes discomfort, gargle with warm salt water. The discomfort should disappear within 24 hours.  Next dose of Tylenol  can be taken at 1pm today if needed.

## 2023-11-27 NOTE — Op Note (Addendum)
 Pre-op Diagnosis:  Invasive ductal carcinoma right breast Post-op Diagnosis: same Procedure:  Right breast magnetic seed localized lumpectomy and deep sentinel lymph node biopsy with Magtrace and nuclear medicine injection Surgeon:  Rella Cardinal. Anesthesia:  GEN - LMA/ PEC block Indications:  This is a 68 year old female with no family history of breast cancer who presents after routine screening mammogram revealed a mass in the right breast. She is found to have a 0.9 x 0.8 cm mass located at 10:00 in the right breast, 8 cm from the nipple. Axillary ultrasound was negative. 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 No previous breast surgery. The patient has an adopted daughter who recently underwent bilateral mastectomies preceded by neoadjuvant chemotherapy. She presents now with her husband to discuss surgical options.   A Magseed was placed by radiology.  We injected radiotracer as well as Magtrace in the pre-op area.  Description of procedure: The patient is brought to the operating room placed in supine position on the operating room table. After an adequate level of general anesthesia was obtained, her right breast and axilla were prepped with ChloraPrep and draped in sterile fashion. A timeout was taken to ensure the proper patient and proper procedure. We interrogated the breast with the Magprobe. I could not identify any magnetic activity in the axilla.  I also could not detect the Magseed in the right upper outer quadrant.  I could identify radioactivity in the axilla.  We made a transverse incision in the axilla over the area of radioactivity after infiltrating with 0.25% Marcaine . Dissection was carried down in the axilla with cautery. We used the neoprobe to guide us  towards the radioactive nodes. Two adjacent deep sentinel lymph nodes were excised and sent together as "Sentinel lymph nodes #1 and 2."  There was minimal  background radioactivity.  I then used the Magprobe.  There was some weak activity in the inferior axilla.  We excised two more areas that were potential lymph nodes.  We then directed the probe through the same incision medially over the edge of pectoralis muscle.  We excised some tissue that had mild magnetic activity and oriented it with a paint kit.  However, specimen mammogram did not show a seed or a clip.  We were then able to clearly identify the area of activity over the [facility].  We excised an area of tissue around the Magseed 2 cm in diameter. The specimen was removed and was oriented with a paint kit. Specimen mammogram showed the radioactive seed as well as the biopsy clip within the specimen. This was sent for pathologic examination. There is no residual magnetic activity within the biopsy cavity. Clips were placed in all five margins.  We inspected carefully for hemostasis. The wound was thoroughly irrigated.   The wound was closed with a deep layer of 3-0 Vicryl and a subcuticular layer of 4-0 Monocryl. Benzoin Steri-Strips were applied. The patient was then extubated and brought to the recovery room in stable condition. All sponge, instrument, and needle counts are correct.   Kari Otto. Eli Grizzle, MD, [facility]  General Surgery   11/27/2023 11:49 AM

## 2023-11-27 NOTE — Transfer of Care (Signed)
 Immediate Anesthesia Transfer of Care Note  Patient: [name]  Procedure(s) Performed: RIGHT BREAST LUMPECTOMY AFTER MAGNETIC SEED LOCALIZATION AND SENTINEL LYMPH NODE BIOPSY (Right: Breast)  Patient Location: PACU  Anesthesia Type:General  Level of Consciousness: drowsy and patient cooperative  Airway & Oxygen Therapy: Patient Spontanous Breathing  Post-op Assessment: Report given to RN and Post -op Vital signs reviewed and stable  Post vital signs: Reviewed and stable  Last Vitals:  Vitals Value Taken Time  BP 131/63 11/27/23 1150  Temp    Pulse 77 11/27/23 1153  Resp 11 11/27/23 1153  SpO2 92 % 11/27/23 1153  Vitals shown include unfiled device data.  Last Pain:  Vitals:   11/27/23 0845  TempSrc:   PainSc: 0-No pain      Patients Stated Pain Goal: 3 (11/27/23 0711)  Complications: No notable events documented.

## 2023-11-27 NOTE — Anesthesia Postprocedure Evaluation (Signed)
 Anesthesia Post Note  Patient: [name]  Procedure(s) Performed: RIGHT BREAST LUMPECTOMY AFTER MAGNETIC SEED LOCALIZATION AND SENTINEL LYMPH NODE BIOPSY (Right: Breast)     Patient location during evaluation: PACU Anesthesia Type: Regional and General Level of consciousness: awake and alert Pain management: pain level controlled Vital Signs Assessment: post-procedure vital signs reviewed and stable Respiratory status: spontaneous breathing, nonlabored ventilation, respiratory function stable and patient connected to nasal cannula oxygen Cardiovascular status: blood pressure returned to baseline and stable Postop Assessment: no apparent nausea or vomiting Anesthetic complications: no  No notable events documented.  Last Vitals:  Vitals:   11/27/23 1215 11/27/23 1253  BP: 125/68 125/64  Pulse: 74 77  Resp: 14 20  Temp:  (!) 36.1 C  SpO2: 96% 96%    Last Pain:  Vitals:   11/27/23 1253  TempSrc: Temporal  PainSc: 0-No pain                 Eithen Castiglia L Dovey Fatzinger

## 2023-11-27 NOTE — Progress Notes (Signed)
 Dr. Eli Grizzle doing injection

## 2023-11-27 NOTE — Anesthesia Procedure Notes (Addendum)
 Anesthesia Regional Block: Pectoralis block   Pre-Anesthetic Checklist: , timeout performed,  Correct Patient, Correct Site, Correct Laterality,  Correct Procedure, Correct Position, site marked,  Risks and benefits discussed,  Pre-op evaluation,  At surgeon's request and post-op pain management  Laterality: Right  Prep: Maximum Sterile Barrier Precautions used, chloraprep       Needles:  Injection technique: Single-shot  Needle Type: Echogenic Stimulator Needle     Needle Length: 9cm  Needle Gauge: 21     Additional Needles:   Procedures:,,,, ultrasound used (permanent image in chart),,    Narrative:  Start time: 11/27/2023 10:00 AM End time: 11/27/2023 10:03 AM Injection made incrementally with aspirations every 5 mL. Anesthesiologist: Grace Laura, MD

## 2023-11-27 NOTE — Anesthesia Procedure Notes (Signed)
 Procedure Name: LMA Insertion Date/Time: 11/27/2023 10:22 AM  Performed by: Arvilla Birmingham, CRNAPre-anesthesia Checklist: Patient identified, Emergency Drugs available, Suction available and Patient being monitored Patient Re-evaluated:Patient Re-evaluated prior to induction Oxygen Delivery Method: Circle system utilized Preoxygenation: Pre-oxygenation with 100% oxygen Induction Type: IV induction Ventilation: Mask ventilation without difficulty LMA: LMA inserted LMA Size: 4.0 Number of attempts: 1 Airway Equipment and Method: Bite block Placement Confirmation: positive ETCO2 Tube secured with: Tape Dental Injury: Teeth and Oropharynx as per pre-operative assessment

## 2023-11-27 NOTE — Progress Notes (Signed)
 Nuc med at bedside doing injections

## 2023-11-27 NOTE — Interval H&P Note (Signed)
 History and Physical Interval Note:  11/27/2023 8:24 AM  [name]  has presented today for surgery, with the diagnosis of RIGHT BREAST INVASIVE DUCTAL CARCINOMA.  The various methods of treatment have been discussed with the patient and family. After consideration of risks, benefits and other options for treatment, the patient has consented to  Procedure(s) with comments: RIGHT BREAST LUMPECTOMY AFTER MAGNETIC SEED LOCALIZATION AND SENTINEL LYMPH NODE BIOPSY (Right) - GEN w/PEC BLOCK RIGHT BREAST SEED LOCALIZED LUMPECTOMY WITH AXILLARY SENTINEL LYMPH NODE BIOPSY as a surgical intervention.  The patient's history has been reviewed, patient examined, no change in status, stable for surgery.  I have reviewed the patient's chart and labs.  Questions were answered to the patient's satisfaction.     [name]

## 2023-11-27 NOTE — Progress Notes (Signed)
Assisted Dr. Lanetta Inch with right, pectoralis, ultrasound guided block. Side rails up, monitors on throughout procedure. See vital signs in flow sheet. Tolerated Procedure well.

## 2023-11-27 NOTE — Anesthesia Preprocedure Evaluation (Addendum)
 Anesthesia Evaluation  Patient identified by MRN, date of birth, ID band Patient awake    Reviewed: Allergy & Precautions, NPO status , Patient's Chart, lab work & pertinent test results  Airway Mallampati: II  TM Distance: >3 FB Neck ROM: Full    Dental no notable dental hx. (+) Teeth Intact, Dental Advisory Given   Pulmonary asthma , former smoker   Pulmonary exam normal breath sounds clear to auscultation       Cardiovascular hypertension, Pt. on medications Normal cardiovascular exam Rhythm:Regular Rate:Normal  HLD  TTE 2023 1. Left ventricular ejection fraction, by estimation, is 60 to 65%. The  left ventricle has normal function. The left ventricle has no regional  wall motion abnormalities. Left ventricular diastolic parameters are  consistent with Grade I diastolic  dysfunction (impaired relaxation).   2. Right ventricular systolic function is normal. The right ventricular  size is normal. Tricuspid regurgitation signal is inadequate for assessing  PA pressure.   3. The mitral valve is grossly normal. Trivial mitral valve  regurgitation. No evidence of mitral stenosis.   4. The aortic valve is tricuspid. There is mild calcification of the  aortic valve. Aortic valve regurgitation is not visualized. Aortic valve  sclerosis is present, with no evidence of aortic valve stenosis.   5. The inferior vena cava is normal in size with greater than 50%  respiratory variability, suggesting right atrial pressure of 3 mmHg.     Neuro/Psych TIA negative psych ROS   GI/Hepatic Neg liver ROS,GERD  ,,  Endo/Other  negative endocrine ROS  Obese BMI 36  Renal/GU Renal InsufficiencyRenal disease  negative genitourinary   Musculoskeletal negative musculoskeletal ROS (+)    Abdominal   Peds  Hematology negative hematology ROS (+)   Anesthesia Other Findings   Reproductive/Obstetrics                              Anesthesia Physical Anesthesia Plan  ASA: 3  Anesthesia Plan: General and Regional   Post-op Pain Management: Regional block* and Tylenol  PO (pre-op)*   Induction: Intravenous  PONV Risk Score and Plan: 3 and Ondansetron , Dexamethasone  and Midazolam   Airway Management Planned: LMA  Additional Equipment:   Intra-op Plan:   Post-operative Plan: Extubation in OR  Informed Consent: I have reviewed the patients History and Physical, chart, labs and discussed the procedure including the risks, benefits and alternatives for the proposed anesthesia with the patient or authorized representative who has indicated his/her understanding and acceptance.     Dental advisory given  Plan Discussed with: CRNA  Anesthesia Plan Comments:        Anesthesia Quick Evaluation

## 2023-11-28 ENCOUNTER — Telehealth: Payer: Self-pay | Admitting: Internal Medicine

## 2023-11-28 ENCOUNTER — Other Ambulatory Visit: Payer: Self-pay | Admitting: Internal Medicine

## 2023-11-28 ENCOUNTER — Encounter (HOSPITAL_BASED_OUTPATIENT_CLINIC_OR_DEPARTMENT_OTHER): Payer: Self-pay | Admitting: Surgery

## 2023-11-28 NOTE — Telephone Encounter (Signed)
 Pt's cardiologist prescribed ZETIA  not us  and cetirizine   is not even on her list

## 2023-11-28 NOTE — Telephone Encounter (Signed)
 This is OTC we typically do not prescribe otc

## 2023-11-28 NOTE — Telephone Encounter (Signed)
 Copied from CRM [id number]. Topic: Clinical - Medication Question >> Nov 28, 2023  9:36 AM [place] E wrote: Reason for CRM: Samia from [facility] pharmacy is waiting to hear back from patient's PCP about two prescriptions that they tried reordering: ezetimibe  (ZETIA ) 10 MG tablet and Cetirizine  (which [place] stated was prescribed in September of 2023). Callback number [phone].

## 2023-11-28 NOTE — Telephone Encounter (Signed)
 This medication is prescribed by her cardiologist I believe?

## 2023-12-01 ENCOUNTER — Other Ambulatory Visit: Payer: Self-pay | Admitting: Cardiology

## 2023-12-01 ENCOUNTER — Telehealth: Payer: Self-pay | Admitting: Internal Medicine

## 2023-12-01 LAB — SURGICAL PATHOLOGY

## 2023-12-02 ENCOUNTER — Encounter: Payer: Self-pay | Admitting: *Deleted

## 2023-12-02 ENCOUNTER — Telehealth: Payer: Self-pay | Admitting: *Deleted

## 2023-12-02 ENCOUNTER — Encounter: Payer: Self-pay | Admitting: Internal Medicine

## 2023-12-02 MED ORDER — CETIRIZINE HCL 10 MG PO TABS: 10.0000 mg | ORAL_TABLET | Freq: Every day | ORAL | 3 refills | Status: AC

## 2023-12-02 NOTE — Telephone Encounter (Signed)
 This has already been sent over to Dr Nicolette Barrio and has been reviewed and answered back to the patient

## 2023-12-02 NOTE — Telephone Encounter (Signed)
 I do not see the cetirizine   on patients list and the ezetimibe  is prescribed by another doctor

## 2023-12-02 NOTE — Telephone Encounter (Signed)
 Copied from CRM [id number]. Topic: Clinical - Medication Question >> Dec 02, 2023  9:16 AM Bambi Bonine D wrote: Reason for CRM: Selinda Dales with [facility] called regarding pending medications for the patient. He stated that the pharmacy sent the request for the medications ezetimibe  (ZETIA ) 10 MG tablet,Cetirizine  HCl 10 MG but never received the medication to the pharmacy.

## 2023-12-02 NOTE — Telephone Encounter (Signed)
Received order for Oncotype Testing per Dr. Chryl Heck. Requisition faxed to pathology and [facility]

## 2023-12-03 ENCOUNTER — Telehealth: Payer: Self-pay | Admitting: Internal Medicine

## 2023-12-03 NOTE — Telephone Encounter (Signed)
 The pharmacy is correct on pt list just and FYI

## 2023-12-03 NOTE — Telephone Encounter (Signed)
 Copied from CRM [id number]. Topic: Clinical - Prescription Issue >> Dec 03, 2023  4:37 PM [place] wrote: Reason for CRM: ezetimibe  (ZETIA ) 10 MG tablet is not at the patient's preferred pharmacy. Patient has been out of the medication for 2 weeks. She has been getting an emergency supply from the pharmacy the past 3 times.  Callback #: [phone]

## 2023-12-04 ENCOUNTER — Other Ambulatory Visit: Payer: Self-pay

## 2023-12-04 MED ORDER — EZETIMIBE 10 MG PO TABS: 10.0000 mg | ORAL_TABLET | Freq: Every day | ORAL | 1 refills | Status: DC

## 2023-12-04 NOTE — Telephone Encounter (Signed)
This was sent in this morning 

## 2023-12-04 NOTE — Telephone Encounter (Signed)
 I have sent this in

## 2023-12-08 ENCOUNTER — Telehealth: Payer: Self-pay | Admitting: *Deleted

## 2023-12-08 NOTE — Telephone Encounter (Signed)
 This RN called pt per her VM left stating need for a compression sleeve due to having surgery on 11/27/2023.  She states the breast is swollen which then goes under her R axillary and around to her shoulder blade as well as down the arm.  She states she had a mild fever yesterday of 99.4.  She states "the incision looks good so I guess I somehow got lymphedema this quickly post surgery "  This RN discussed above with need to first be assessed by the surgeon's office for evaluation and then further recommendations.  This RN called to Dr Rosalita Combe - spoke with the Triage nurse who request for pt to come in now to be worked in for evaluation.  This RN called pt who verbalized she is on her way now ( verified that she is going to surgeon's on [address] ).

## 2023-12-10 ENCOUNTER — Encounter (HOSPITAL_COMMUNITY): Payer: Self-pay

## 2023-12-11 ENCOUNTER — Telehealth: Payer: Self-pay

## 2023-12-11 NOTE — Telephone Encounter (Signed)
 Pt called and states she had seroma drained at surgeons office this week but fluid has returned. She is asking to be seen. When I called pt she states she was able to get an appt with Dr Rosalita Combe PA at the surgery center today. She will call for further assistance if needed.

## 2023-12-12 ENCOUNTER — Encounter: Payer: Self-pay | Admitting: *Deleted

## 2023-12-12 ENCOUNTER — Telehealth: Payer: Self-pay | Admitting: *Deleted

## 2023-12-12 DIAGNOSIS — Z170 Estrogen receptor positive status [ER+]: Secondary | ICD-10-CM

## 2023-12-12 NOTE — Telephone Encounter (Signed)
 Received oncotype results of 1/3%.  Patient aware. Referral placed for Dr. Eloise Hake.

## 2023-12-16 ENCOUNTER — Encounter: Payer: Self-pay | Admitting: *Deleted

## 2023-12-18 NOTE — Progress Notes (Signed)
 Location of Breast Cancer:Right side  Histology per Pathology Report:     Receptor Status: ER(95), PR (100), Her2-neu (1), Ki-(20)  Did patient present with symptoms (if so, please note symptoms) or was this found on screening mammography?: mamogram  Past/Anticipated interventions by surgeon, if any:   Past/Anticipated interventions by medical oncology, if any:   Lymphedema issues, if any:  Yes, she reports a seroma that was drained x3 since surgery but reports less drainage.  Pain issues, if any:  Yes, she reports 4/10 pain in breast area from wearing a bra with underwire. She wasa educated on to wear one without underwire..  Skin issues if any   SAFETY ISSUES: Prior radiation? No Pacemaker/ICD? no Possible current pregnancy?no Is the patient on methotrexate? no  Current Complaints / other details:    The patient returns today to discuss radiation treatment options. Patient continues to heal from her lumpectomy

## 2023-12-18 NOTE — Progress Notes (Deleted)
 [name]

## 2023-12-19 ENCOUNTER — Inpatient Hospital Stay
Admission: RE | Admit: 2023-12-19 | Discharge: 2023-12-19 | Disposition: A | Discharge status: Home / Self Care | Payer: Self-pay | Source: Ambulatory Visit | Attending: Radiation Oncology | Admitting: Radiation Oncology

## 2023-12-19 ENCOUNTER — Other Ambulatory Visit: Payer: Self-pay | Admitting: Radiation Oncology

## 2023-12-19 DIAGNOSIS — Z170 Estrogen receptor positive status [ER+]: Secondary | ICD-10-CM

## 2023-12-21 NOTE — Progress Notes (Signed)
 Radiation Oncology         [phone]) [phone] ________________________________  Name: [name] MRN: [id number]  Date: 12/22/2023  DOB: 21-Nov-1955  Re-Evaluation Note  CC: Adelia Homestead, MD  Murleen Arms, MD    ICD-10-CM   1. 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, ER+ / PR+ / Her2-   Cancer Staging  Malignant neoplasm of upper-outer quadrant of right breast in female, estrogen receptor positive (HCC) Staging form: Breast, AJCC 8th Edition - Clinical stage from 11/19/2023: Stage IA (cT1b, cN0, cM0, G2, ER+, PR+, HER2-) - Signed by Murleen Arms, MD on 11/19/2023 Stage prefix: Initial diagnosis Histologic grading system: 3 grade system - Pathologic: Stage IA (pT1c, pN0, cM0, G2, ER+, PR+, HER2-) - Signed by Pearlene Bouchard, PA-C on 12/22/2023 Histologic grading system: 3 grade system   Narrative:  The patient returns today to discuss radiation treatment options. She was seen in the multidisciplinary breast clinic on 11/19/23.   She opted to pursue genetic testing on her consultation date. Results showed no clinically significant variants detected by BRCA1/2 analyses with +RNAinsight analyses.   Since her consultation date, she opted to proceed with a right breast lumpectomy with right axillary SLN excisions on 11/27/23 under the care of Dr. Eli Grizzle. Pathology from the procedure revealed: tumor the size of 1.4 cm; histology of grade 2 invasive ductal carcinoma; negative for LVI or PNI; all margins negative for invasive carcinoma; nodal status of 2/2 right axillary SLN excisions negative for carcinoma (a 3rd SLN excision was performed which ended up being benign fibroadipose tissue without evidence of carcinoma, with no nodal tissue present in the specimen).  Prognostic indicators significant  for: estrogen receptor 95% positive and progesterone receptor 100% positive, both with strong staining intensity; Proliferation marker Ki67 at 20%; Her2 status negative; Grade 2.   Oncotype DX was obtained on the final surgical sample and showed a recurrence score of 1 which predicts a risk of recurrence outside the breast over the next 9 years of 3%, if the patient's only systemic therapy were to be an antiestrogen for 5 years.  It also predicts no significant benefit from chemotherapy.  She did develop post-op swelling and lateral breast pain and was found to have a seroma at the right axillary incision site. This was aspirated by general surgery on 05/05 and yielded 160 cc's of serosanguineous fluid with almost immediate relief of her symptoms achieved. The seroma was again aspirated on 05/08 and another 30 cc's was drained.   She is interested in pursuing an AI based on her discussion with Dr. Arno Bibles on her breast clinic consultation date. She is scheduled to follow-up with Dr. Arno Bibles on 12/25/23 and will discuss AI options further at that time. As noted above, there is no role of chemotherapy based on her Oncotype results.   Patient saw most recently saw Dr. Eli Grizzle earlier today. He again drained her seroma today, aspirating only 10 cc of clear yellow fluid today. Per his recommendations, patient will follow-up with him in 6 months.   On review of systems, the patient reports to be doing well overall today. She denies breast pain, further swelling, issues with range of motion and any other symptoms.    Allergies:  is allergic to doxycycline , hydrocodone  bit-homatrop mbr, penicillins, promethazine hcl, clarithromycin, statins,  and kiwi extract.  Meds: Current Outpatient Medications  Medication Sig Dispense Refill   albuterol  (PROVENTIL ) (2.5 MG/3ML) 0.083% nebulizer solution Take 3 mLs (2.5 mg total) by nebulization every 6 (six) hours as needed for wheezing or shortness of breath. 150 mL 1    albuterol  (VENTOLIN  HFA) 108 (90 Base) MCG/ACT inhaler Inhale 1-2 puffs into the lungs every 4 (four) hours as needed for wheezing or shortness of breath. 18 g 0   budesonide -formoterol  (SYMBICORT ) 80-4.5 MCG/ACT inhaler Take 2 puffs first thing in am and then another 2 puffs about 12 hours later. 1 each 12   cetirizine  (ZYRTEC ) 10 MG tablet Take 1 tablet (10 mg total) by mouth daily. 90 tablet 3   citalopram  (CELEXA ) 10 MG tablet Take 1 tablet (10 mg total) by mouth daily. 90 tablet 3   ezetimibe  (ZETIA ) 10 MG tablet Take 1 tablet (10 mg total) by mouth daily. 90 tablet 1   fluticasone  (FLONASE ) 50 MCG/ACT nasal spray Place 2 sprays into both nostrils 2 (two) times daily as needed.  5   losartan -hydrochlorothiazide  (HYZAAR) 50-12.5 MG tablet Take 1 tablet by mouth daily.     montelukast  (SINGULAIR ) 10 MG tablet Take 1 tablet (10 mg total) by mouth at bedtime. 90 tablet 3   REPATHA  SURECLICK 140 MG/ML SOAJ ADMINISTER 1 ML UNDER THE SKIN EVERY 14 DAYS 2 mL 11   tirzepatide  (ZEPBOUND ) 10 MG/0.5ML Pen Inject 10 mg into the skin once a week. 6 mL 3   famotidine  (PEPCID ) 40 MG tablet Take 40 mg by mouth 2 (two) times daily. (Patient not taking: Reported on 12/22/2023)     ondansetron  (ZOFRAN -ODT) 4 MG disintegrating tablet Take 1 tablet (4 mg total) by mouth every 8 (eight) hours as needed for up to 15 doses for nausea or vomiting. 15 tablet 0   potassium chloride  (KLOR-CON  10) 10 MEQ tablet Take 1 tablet (10 mEq total) by mouth 3 (three) times daily. (Patient not taking: Reported on 12/22/2023) 90 tablet 0   traMADol  (ULTRAM ) 50 MG tablet Take 1-2 tablets (50-100 mg total) by mouth every 6 (six) hours as needed for moderate pain (pain score 4-6) or severe pain (pain score 7-10). (Patient not taking: Reported on 12/22/2023) 30 tablet 0   Vitamin D , Ergocalciferol , (DRISDOL ) 1.25 MG (50000 UNIT) CAPS capsule Take 1 capsule (50,000 Units total) by mouth every 7 (seven) days. (Patient not taking: Reported on  12/22/2023) 12 capsule 0   No current facility-administered medications for this encounter.    Physical Findings: The patient is in no acute distress. Patient is alert and oriented.  height is 5' 3.39" (1.61 m) and weight is 207 lb 6.4 oz (94.1 kg). Her temperature is 97.9 F (36.6 C). Her blood pressure is 106/83 and her pulse is 72. Her respiration is 18 and oxygen saturation is 99%.  No significant changes. Lungs are clear to auscultation bilaterally. Heart has regular rate and rhythm. No palpable cervical, supraclavicular, or axillary adenopathy. Abdomen soft, non-tender, normal bowel sounds. Left Breast: no palpable mass, nipple discharge or bleeding. Right Breast: lumpectomy incision in the upper outer breast with well-approximated skin edges, no evidence of infection or delayed wound healing. Mild firmness palpated beneath the incision.   Lab Findings: Lab Results  Component Value Date   WBC 5.4 11/19/2023   HGB 15.0 11/19/2023   HCT 43.5 11/19/2023   MCV 85.8 11/19/2023   PLT 201 11/19/2023    Radiographic Findings: NM Sentinel Node Inj-No Rpt (Breast) Result Date:  11/27/2023 Lymphoseek was injected by the Nuclear Medicine Technologist for sentinel lymph node localization.    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 Dr. Eloise Hake recommends radiation to reduce the risk of locoregional disease recurrence. Today we discussed the natural course of stage I breast cancer. We highlighted the role of radiotherapy in the management and focused on the details of logistics and delivery. We reviewed the anticipated acute and late sequelae associated with radiation in this setting. The patient was encouraged to ask questions that I answered to the best of my ability. Patient expressed readiness to proceed with treatment. A consent form was reviewed  and signed today.    Plan:  Patient is scheduled for CT simulation later today. Will plan to begin radiation approximately one week after simulation. Plan to deliver 40.05 Gy in 15 fractions to the right breast followed by a boost of 10 Gy in 5 fractions to the lumpectomy cavity.   -----------------------------------   Julio Ohm, PA-C  This document serves as a record of services personally performed by Amiel Kalata, PA-C. It was created on his behalf by Aleta Anda, a trained medical scribe. The creation of this record is based on the scribe's personal observations and the provider's statements to them. This document has been checked and approved by the attending provider.

## 2023-12-22 ENCOUNTER — Ambulatory Visit
Admission: RE | Admit: 2023-12-22 | Discharge: 2023-12-22 | Disposition: A | Discharge status: Home / Self Care | Source: Ambulatory Visit | Attending: Radiation Oncology | Admitting: Radiation Oncology

## 2023-12-22 ENCOUNTER — Encounter: Payer: Self-pay | Admitting: Radiation Oncology

## 2023-12-22 VITALS — BP 106/83 | HR 72 | Temp 97.9°F | Resp 18 | Ht 63.39 in | Wt 207.4 lb

## 2023-12-22 DIAGNOSIS — Z170 Estrogen receptor positive status [ER+]: Secondary | ICD-10-CM

## 2023-12-22 DIAGNOSIS — C50411 Malignant neoplasm of upper-outer quadrant of right female breast: Secondary | ICD-10-CM | POA: Diagnosis present

## 2023-12-22 DIAGNOSIS — Z510 Encounter for antineoplastic radiation therapy: Secondary | ICD-10-CM | POA: Insufficient documentation

## 2023-12-24 ENCOUNTER — Telehealth: Payer: Self-pay

## 2023-12-25 ENCOUNTER — Encounter: Payer: Self-pay | Admitting: Hematology and Oncology

## 2023-12-25 ENCOUNTER — Inpatient Hospital Stay: Attending: Hematology and Oncology | Admitting: Hematology and Oncology

## 2023-12-25 VITALS — BP 121/63 | HR 77 | Temp 98.3°F | Resp 17 | Wt 211.3 lb

## 2023-12-25 DIAGNOSIS — Z801 Family history of malignant neoplasm of trachea, bronchus and lung: Secondary | ICD-10-CM | POA: Diagnosis not present

## 2023-12-25 DIAGNOSIS — Z87891 Personal history of nicotine dependence: Secondary | ICD-10-CM | POA: Diagnosis not present

## 2023-12-25 DIAGNOSIS — Z807 Family history of other malignant neoplasms of lymphoid, hematopoietic and related tissues: Secondary | ICD-10-CM | POA: Insufficient documentation

## 2023-12-25 DIAGNOSIS — Z800 Family history of malignant neoplasm of digestive organs: Secondary | ICD-10-CM | POA: Insufficient documentation

## 2023-12-25 DIAGNOSIS — Z8042 Family history of malignant neoplasm of prostate: Secondary | ICD-10-CM | POA: Insufficient documentation

## 2023-12-25 DIAGNOSIS — Z170 Estrogen receptor positive status [ER+]: Secondary | ICD-10-CM | POA: Insufficient documentation

## 2023-12-25 DIAGNOSIS — Z1721 Progesterone receptor positive status: Secondary | ICD-10-CM | POA: Diagnosis not present

## 2023-12-25 DIAGNOSIS — C50411 Malignant neoplasm of upper-outer quadrant of right female breast: Secondary | ICD-10-CM | POA: Diagnosis present

## 2023-12-25 DIAGNOSIS — Z8049 Family history of malignant neoplasm of other genital organs: Secondary | ICD-10-CM | POA: Diagnosis not present

## 2023-12-25 DIAGNOSIS — Z1732 Human epidermal growth factor receptor 2 negative status: Secondary | ICD-10-CM | POA: Diagnosis not present

## 2023-12-25 MED ORDER — ANASTROZOLE 1 MG PO TABS: 1.0000 mg | ORAL_TABLET | Freq: Every day | ORAL | 3 refills | Status: DC

## 2023-12-25 NOTE — Assessment & Plan Note (Addendum)
 This is a very pleasant 68 year old postmenopausal female patient with newly diagnosed right breast invasive ductal carcinoma, ER/PR strongly staining of 95% and 100% respectively, HER2 1+ Ki-67 20% presenting post surgery.  Assessment and Plan Assessment & Plan Breast cancer, invasive ductal carcinoma Invasive ductal carcinoma post-lumpectomy with clear lymph nodes and low oncotype score. Radiation therapy ongoing. Anastrozole recommended post-radiation for recurrence prevention. - Continue radiation therapy, expected completion on January 26, 2024. - Prescribe anastrozole 1 mg daily, to start after radiation therapy, around March 05, 2024. - Monitor for side effects of anastrozole, including hot flashes, vaginal dryness, and arthralgias. - Order bone density scan to monitor for osteoporosis risk due to anastrozole. - Schedule follow-up three months after starting anastrozole. - She was initially upset we didn't do MRI of her breast. I clearly explained the indication for MRI of breast and she is satisfied.  Seroma post-surgery Persistent seroma post-breast surgery. - Aspiration of seroma fluid as needed.  Weight management Weight loss of 68 pounds achieved with Zepbound . Plans to resume post-radiation.  Time spent: 35 min

## 2023-12-25 NOTE — Progress Notes (Signed)
 [facility] CONSULT NOTE  Patient Care Team: Adelia Homestead, MD as PCP - General (Internal Medicine) Audery Blazing Deannie Fabian, MD as PCP - Cardiology (Cardiology) Alane Hsu, RN as Oncology Nurse Navigator Auther Bo, RN as Oncology Nurse Navigator Dareen Ebbing, MD as Consulting Physician (General Surgery) Murleen Arms, MD as Consulting Physician (Hematology and Oncology) Retta Caster, MD as Consulting Physician (Radiation Oncology)  CHIEF COMPLAINTS/PURPOSE OF CONSULTATION:  Newly diagnosed breast cancer  HISTORY OF PRESENTING ILLNESS:  [name] 68 y.o. female is here because of recent diagnosis of right breast IDC  I reviewed her records extensively and collaborated the history with the patient.  SUMMARY OF ONCOLOGIC HISTORY: Oncology History  Malignant neoplasm of upper-outer quadrant of right breast in female, estrogen receptor positive (HCC)  10/24/2023 Mammogram   Irregular mass in the right breast which is indeterminate. Additional views with possible US  are recommended. US  confirmed 9 * 8 mm mass in the right breast.   11/03/2023 Pathology Results   Right breast needle core biopsy 8 cmfn showed IDC, grade 2  The tumor cells are NEGATIVE for Her2 (1+). Estrogen Receptor:  95%, POSITIVE, STRONG STAINING INTENSITY Progesterone Receptor:  100%, POSITIVE, STRONG STAINING INTENSITY Proliferation Marker Ki67:  20%    11/17/2023 Initial Diagnosis   Malignant neoplasm of upper-outer quadrant of right breast in female, estrogen receptor positive (HCC)   11/19/2023 Cancer Staging   Staging form: Breast, AJCC 8th Edition - Clinical stage from 11/19/2023: Stage IA (cT1b, cN0, cM0, G2, ER+, PR+, HER2-) - Signed by Murleen Arms, MD on 11/19/2023 Stage prefix: Initial diagnosis Histologic grading system: 3 grade system    Genetic Testing   [facility] CancerNext-Expanded Panel+RNA was Negative. Report date is 11/25/2023.   The CancerNext-Expanded gene  panel offered by [facility] and includes sequencing, rearrangement, and RNA analysis for the following 76 genes: AIP, ALK, APC, ATM, AXIN2, BAP1, BARD1, BMPR1A, BRCA1, BRCA2, BRIP1, CDC73, CDH1, CDK4, CDKN1B, CDKN2A, CEBPA, CHEK2, CTNNA1, DDX41, DICER1, ETV6, FH, FLCN, GATA2, LZTR1, MAX, MBD4, MEN1, MET, MLH1, MSH2, MSH3, MSH6, MUTYH, NF1, NF2, NTHL1, PALB2, PHOX2B, PMS2, POT1, PRKAR1A, PTCH1, PTEN, RAD51C, RAD51D, RB1, RET, RUNX1, SDHA, SDHAF2, SDHB, SDHC, SDHD, SMAD4, SMARCA4, SMARCB1, SMARCE1, STK11, SUFU, TMEM127, TP53, TSC1, TSC2, VHL, and WT1 (sequencing and deletion/duplication); EGFR, HOXB13, KIT, MITF, PDGFRA, POLD1, and POLE (sequencing only); EPCAM and GREM1 (deletion/duplication only).     12/22/2023 Cancer Staging   Staging form: Breast, AJCC 8th Edition - Pathologic: Stage IA (pT1c, pN0, cM0, G2, ER+, PR+, HER2-) - Signed by Pearlene Bouchard, PA-C on 12/22/2023 Histologic grading system: 3 grade system    Discussed the use of AI scribe software for clinical note transcription with the patient, who gave verbal consent to proceed.  History of Present Illness  [name] is a 68 year old female with breast cancer who presents for a follow-up regarding her treatment plan.  She has a history of invasive ductal carcinoma of the breast, diagnosed earlier this year. She underwent a lumpectomy, and her lymph nodes were clear. Her oncotype score was low, indicating a low risk of recurrence. She is currently scheduled for radiation therapy, which is scheduled to end on January 26, 2024.  She has been experiencing a seroma, which has required fluid to be drained multiple times. This has caused discomfort and concern due to breast asymmetry, with one breast being larger than the other.  She has lost 68 pounds since October of the previous year, attributed to the  medication Zepbound . This weight loss has significantly improved her quality of life, allowing her to be more active and engage with her  grandchildren. She is concerned about maintaining her weight loss and plans to continue taking Zepbound .  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 She has a supportive network, including a friend who is a doctor and has provided her with advice regarding her cancer treatment.   MEDICAL HISTORY:  Past Medical History:  Diagnosis Date   Arthritis    neck, knees, ankles, feet, back   Asthma, mild intermittent    prn inhaler   Breast cancer (HCC)    Cervical spondylosis    Chronic dyspnea    per pt gets winded w/ stairs, but recovers quickly   Diverticular disease of colon    followed by dr Tova Fresh (GI)---  chronic w/ chronic diarrhea   Essential hypertension    Fatty infiltration of liver    followed by dr Tova Fresh--- abd ultrasound in epic 08-31-2020   Frequency of urination    History of chest pain    evaluated by cardiologist-- dr Audery Blazing,  11/ 2017 nuclear stress test suggest ischemia,  06/2016 cardiac cath , showed normal coronary anatomy and lvf   History of COVID-19    per pt in 2019   treated with presumed covid had  mild to moderate symptoms that resolved   History of diverticulitis of colon    05/ 2022   History of TIA (transient ischemic attack) 06/2003   Hyperlipidemia    Hypertension    Limitation of joint motion of neck    per pt due to bone spurs C2-5   Mild intermittent asthma    followed by pcp   PMB (postmenopausal bleeding)    Pre-diabetes    Pulmonary nodule    followed by pcp,  last hest CT in epic 04-21-2018   Stenosis of cervix    SUI (stress urinary incontinence, female)    Thickened endometrium    TMJ syndrome    Wears glasses     SURGICAL HISTORY: Past Surgical History:  Procedure Laterality Date   APPENDECTOMY  1979   BREAST ENHANCEMENT SURGERY Bilateral 1988   per pt implants removed 2002   BUNIONECTOMY Left 07/17/2007   @[facility]    BUNIONECTOMY Right 05/26/2014   Procedure: RIGHT FOOT  LAPIDUS BUNION CORRECTION AMD MODIFIED MCBRIDE BUNIONECTOMY;  Surgeon: Amada Backer, MD;  Location: [facility];  Service: Orthopedics;  Laterality: Right;   CARDIAC CATHETERIZATION N/A 06/28/2016   Procedure: Left Heart Cath and Coronary Angiography;  Surgeon: Peter M Swaziland, MD;  Location: [facility] INVASIVE CV LAB;  Service: Cardiovascular;  Laterality: N/A;   CESAREAN SECTION  1986   COLONOSCOPY  12/27/2020   by dr Tova Fresh   FINGER SURGERY Left 07/28/2015   @[facility]  :  left thumb "knuckle collasped"  arthroplasty   FOOT HARDWARE REMOVAL  04/25/2016   @[facility] ;   right foot   FOOT SURGERY Right 04/04/2015   @[facility] ;   first and second toe's   GANGLION CYST EXCISION Right 2016   wrist   HYSTEROSCOPY WITH D & C N/A 05/24/2021   Procedure: DILATATION AND CURETTAGE /HYSTEROSCOPY and MyoSure;  Surgeon: Olin Bertin, DO;  Location:  SURGERY CENTER;  Service: Gynecology;  Laterality: N/A;   NASAL SEPTUM SURGERY  1981   RADIOACTIVE SEED GUIDED AXILLARY SENTINEL LYMPH NODE Right 11/27/2023   Procedure: RIGHT BREAST LUMPECTOMY AFTER MAGNETIC  SEED LOCALIZATION AND SENTINEL LYMPH NODE BIOPSY;  Surgeon: Dareen Ebbing, MD;  Location:  SURGERY CENTER;  Service: General;  Laterality: Right;  GEN w/PEC BLOCK RIGHT BREAST SEED LOCALIZED LUMPECTOMY WITH AXILLARY SENTINEL LYMPH NODE BIOPSY   ROTATOR CUFF REPAIR W/ DISTAL CLAVICLE EXCISION Left 08/18/2008   @[facility]  by dr Mevelyn Acton   SHOULDER ARTHROSCOPY W/ LABRAL REPAIR Left 06/07/2008   @[facility] ;  and SAD  by dr Mevelyn Acton   TONSILLECTOMY  1964   TUBAL LIGATION Bilateral 1999    SOCIAL HISTORY: Social History   Socioeconomic History   Marital status: Married    Spouse name: Not on file   Number of children: 2   Years of education: Not on file   Highest education level: Not on file  Occupational History   Not on file  Tobacco Use   Smoking status: Former    Current packs/day: 0.00    Types: Cigarettes    Start date: 09/13/1975     Quit date: 09/12/2005    Years since quitting: 18.2   Smokeless tobacco: Never  Vaping Use   Vaping status: Never Used  Substance and Sexual Activity   Alcohol use: Yes    Comment: 1   Drug use: No   Sexual activity: Not on file  Other Topics Concern   Not on file  Social History Narrative   Right hand   Lives with husband   One story home   Does work in [employer]   Drinks caffeine   Social Drivers of Health   Financial Resource Strain: Not on file  Food Insecurity: No Food Insecurity (11/19/2023)   Hunger Vital Sign    Worried About Running Out of Food in the Last Year: Never true    Ran Out of Food in the Last Year: Never true  Transportation Needs: No Transportation Needs (11/19/2023)   PRAPARE - Administrator, Civil Service (Medical): No    Lack of Transportation (Non-Medical): No  Physical Activity: Not on file  Stress: Not on file  Social Connections: Unknown (12/18/2021)   [facility], [facility]   Social Network    Social Network: Not on file  Intimate Partner Violence: Unknown (11/09/2021)   Received from [facility], [facility]   HITS    Physically Hurt: Not on file    Insult or Talk Down To: Not on file    Threaten Physical Harm: Not on file    Scream or Curse: Not on file    FAMILY HISTORY: Family History  Problem Relation Age of Onset   Lung cancer Mother 7   Diabetes Mother    Thalassemia Mother    Colon polyps Mother    Atrial fibrillation Father    Lung cancer Father 84   Colon cancer Father 11   Bone cancer Father 61   Prostate cancer Father        dx. ?, patient reports all cancers were separate primaries   Thalassemia Sister    Lymphoma Brother 67   Thalassemia Maternal Aunt    Heart disease Paternal Aunt    Colon cancer Paternal Aunt        dx. >50   Uterine cancer Paternal Aunt        dx. >50   Colon cancer Paternal Aunt        dx. >50   Uterine cancer Paternal Aunt        dx. >50   Colon cancer  Paternal Grandmother 54 -  66   Colon cancer Cousin 21 - 59       maternal first cousin   Colon cancer Cousin 4 - 63       paternal first cousin   Rectal cancer Neg Hx    Stomach cancer Neg Hx     ALLERGIES:  is allergic to doxycycline , hydrocodone  bit-homatrop mbr, penicillins, promethazine hcl, clarithromycin, statins, and kiwi extract.  MEDICATIONS:  Current Outpatient Medications  Medication Sig Dispense Refill   anastrozole (ARIMIDEX) 1 MG tablet Take 1 tablet (1 mg total) by mouth daily. 90 tablet 3   albuterol  (PROVENTIL ) (2.5 MG/3ML) 0.083% nebulizer solution Take 3 mLs (2.5 mg total) by nebulization every 6 (six) hours as needed for wheezing or shortness of breath. 150 mL 1   albuterol  (VENTOLIN  HFA) 108 (90 Base) MCG/ACT inhaler Inhale 1-2 puffs into the lungs every 4 (four) hours as needed for wheezing or shortness of breath. 18 g 0   budesonide -formoterol  (SYMBICORT ) 80-4.5 MCG/ACT inhaler Take 2 puffs first thing in am and then another 2 puffs about 12 hours later. 1 each 12   cetirizine  (ZYRTEC ) 10 MG tablet Take 1 tablet (10 mg total) by mouth daily. 90 tablet 3   citalopram  (CELEXA ) 10 MG tablet Take 1 tablet (10 mg total) by mouth daily. 90 tablet 3   ezetimibe  (ZETIA ) 10 MG tablet Take 1 tablet (10 mg total) by mouth daily. 90 tablet 1   famotidine  (PEPCID ) 40 MG tablet Take 40 mg by mouth 2 (two) times daily. (Patient not taking: Reported on 12/22/2023)     fluticasone  (FLONASE ) 50 MCG/ACT nasal spray Place 2 sprays into both nostrils 2 (two) times daily as needed.  5   losartan -hydrochlorothiazide  (HYZAAR) 50-12.5 MG tablet Take 1 tablet by mouth daily.     montelukast  (SINGULAIR ) 10 MG tablet Take 1 tablet (10 mg total) by mouth at bedtime. 90 tablet 3   potassium chloride  (KLOR-CON  10) 10 MEQ tablet Take 1 tablet (10 mEq total) by mouth 3 (three) times daily. (Patient not taking: Reported on 12/22/2023) 90 tablet 0   REPATHA  SURECLICK 140 MG/ML SOAJ ADMINISTER 1 ML UNDER  THE SKIN EVERY 14 DAYS 2 mL 11   tirzepatide  (ZEPBOUND ) 10 MG/0.5ML Pen Inject 10 mg into the skin once a week. 6 mL 3   traMADol  (ULTRAM ) 50 MG tablet Take 1-2 tablets (50-100 mg total) by mouth every 6 (six) hours as needed for moderate pain (pain score 4-6) or severe pain (pain score 7-10). (Patient not taking: Reported on 12/22/2023) 30 tablet 0   Vitamin D , Ergocalciferol , (DRISDOL ) 1.25 MG (50000 UNIT) CAPS capsule Take 1 capsule (50,000 Units total) by mouth every 7 (seven) days. (Patient not taking: Reported on 12/22/2023) 12 capsule 0   No current facility-administered medications for this visit.    REVIEW OF SYSTEMS:   Constitutional: Denies fevers, chills or abnormal night sweats Eyes: Denies blurriness of vision, double vision or watery eyes Ears, nose, mouth, throat, and face: Denies mucositis or sore throat Respiratory: Denies cough, dyspnea or wheezes Cardiovascular: Denies palpitation, chest discomfort or lower extremity swelling Gastrointestinal:  Denies nausea, heartburn or change in bowel habits Skin: Denies abnormal skin rashes Lymphatics: Denies new lymphadenopathy or easy bruising Neurological:Denies numbness, tingling or new weaknesses Behavioral/Psych: Mood is stable, no new changes  Breast: Denies any palpable lumps or discharge All other systems were reviewed with the patient and are negative.  PHYSICAL EXAMINATION: ECOG PERFORMANCE STATUS: 0 - Asymptomatic  Vitals:   12/25/23 1509  BP: 121/63  Pulse: 77  Resp: 17  Temp: 98.3 F (36.8 C)  SpO2: 97%   Filed Weights   12/25/23 1509  Weight: 211 lb 4.8 oz (95.8 kg)    GENERAL:alert, no distress and comfortable  LABORATORY DATA:  I have reviewed the data as listed Lab Results  Component Value Date   WBC 5.4 11/19/2023   HGB 15.0 11/19/2023   HCT 43.5 11/19/2023   MCV 85.8 11/19/2023   PLT 201 11/19/2023   Lab Results  Component Value Date   NA 139 11/19/2023   K 4.0 11/19/2023   CL 103  11/19/2023   CO2 29 11/19/2023    RADIOGRAPHIC STUDIES: I have personally reviewed the radiological reports and agreed with the findings in the report.  ASSESSMENT AND PLAN:  Malignant neoplasm of upper-outer quadrant of right breast in female, estrogen receptor positive (HCC) This is a very pleasant 68 year old postmenopausal female patient with newly diagnosed right breast invasive ductal carcinoma, ER/PR strongly staining of 95% and 100% respectively, HER2 1+ Ki-67 20% presenting post surgery.  Assessment and Plan Assessment & Plan Breast cancer, invasive ductal carcinoma Invasive ductal carcinoma post-lumpectomy with clear lymph nodes and low oncotype score. Radiation therapy ongoing. Anastrozole recommended post-radiation for recurrence prevention. - Continue radiation therapy, expected completion on January 26, 2024. - Prescribe anastrozole 1 mg daily, to start after radiation therapy, around March 05, 2024. - Monitor for side effects of anastrozole, including hot flashes, vaginal dryness, and arthralgias. - Order bone density scan to monitor for osteoporosis risk due to anastrozole. - Schedule follow-up three months after starting anastrozole. - She was initially upset we didn't do MRI of her breast. I clearly explained the indication for MRI of breast and she is satisfied.  Seroma post-surgery Persistent seroma post-breast surgery. - Aspiration of seroma fluid as needed.  Weight management Weight loss of 68 pounds achieved with Zepbound . Plans to resume post-radiation.  Time spent: 35 min      All questions were answered. The patient knows to call the clinic with any problems, questions or concerns.    Murleen Arms, MD 12/26/23

## 2023-12-26 DIAGNOSIS — C50411 Malignant neoplasm of upper-outer quadrant of right female breast: Secondary | ICD-10-CM | POA: Diagnosis not present

## 2023-12-30 ENCOUNTER — Other Ambulatory Visit: Payer: Self-pay

## 2023-12-30 ENCOUNTER — Ambulatory Visit
Admission: RE | Admit: 2023-12-30 | Discharge: 2023-12-30 | Disposition: A | Discharge status: Home / Self Care | Source: Ambulatory Visit | Attending: Radiation Oncology | Admitting: Radiation Oncology

## 2023-12-30 ENCOUNTER — Encounter: Payer: Self-pay | Admitting: *Deleted

## 2023-12-30 ENCOUNTER — Other Ambulatory Visit (HOSPITAL_COMMUNITY): Payer: Self-pay

## 2023-12-30 ENCOUNTER — Telehealth: Payer: Self-pay

## 2023-12-30 DIAGNOSIS — C50411 Malignant neoplasm of upper-outer quadrant of right female breast: Secondary | ICD-10-CM | POA: Diagnosis not present

## 2023-12-30 DIAGNOSIS — Z170 Estrogen receptor positive status [ER+]: Secondary | ICD-10-CM

## 2023-12-30 LAB — RAD ONC ARIA SESSION SUMMARY
Course Elapsed Days: 0
Plan Fractions Treated to Date: 1
Plan Prescribed Dose Per Fraction: 2.67 Gy
Plan Total Fractions Prescribed: 15
Plan Total Prescribed Dose: 40.05 Gy
Reference Point Dosage Given to Date: 2.67 Gy
Reference Point Session Dosage Given: 2.67 Gy
Session Number: 1

## 2023-12-30 MED ORDER — ALRA NON-METALLIC DEODORANT (RAD-ONC)
1.0000 | Freq: Once | TOPICAL | Status: AC
  Administered 2023-12-30: 1 via TOPICAL

## 2023-12-30 MED ORDER — RADIAPLEXRX EX GEL: Freq: Once | CUTANEOUS | Status: AC

## 2023-12-30 NOTE — Telephone Encounter (Signed)
 Pharmacy Patient Advocate Encounter   Received notification from CoverMyMeds that prior authorization for Zepbound  10 is required/requested.   Insurance verification completed.   The patient is insured through [facility] .   Per test claim: PA required; PA submitted to above mentioned insurance via CoverMyMeds Key/confirmation #/[facility] Status is pending

## 2023-12-31 ENCOUNTER — Other Ambulatory Visit: Payer: Self-pay

## 2023-12-31 ENCOUNTER — Ambulatory Visit
Admission: RE | Admit: 2023-12-31 | Discharge: 2023-12-31 | Disposition: A | Discharge status: Home / Self Care | Source: Ambulatory Visit | Attending: Radiation Oncology | Admitting: Radiation Oncology

## 2023-12-31 DIAGNOSIS — C50411 Malignant neoplasm of upper-outer quadrant of right female breast: Secondary | ICD-10-CM | POA: Diagnosis not present

## 2023-12-31 LAB — RAD ONC ARIA SESSION SUMMARY
Course Elapsed Days: 1
Plan Fractions Treated to Date: 2
Plan Prescribed Dose Per Fraction: 2.67 Gy
Plan Total Fractions Prescribed: 15
Plan Total Prescribed Dose: 40.05 Gy
Reference Point Dosage Given to Date: 5.34 Gy
Reference Point Session Dosage Given: 2.67 Gy
Session Number: 2

## 2023-12-31 NOTE — Telephone Encounter (Signed)
 Pharmacy Patient Advocate Encounter  Received notification from [employer] [facility] that Prior Authorization for  Zepbound  Injection (tirzepatide ) has been DENIED.  Full denial letter will be uploaded to the media tab. See denial reason below.  Your plan only covers this drug when you experience benefits from taking the drug and when your results from taking the drug are sent to us . Your doctor needs to send us  all of the following: A) Your weight prior to starting weight loss drug therapy, B) Your weight now, and C) The dates your weights were taken. We have denied your request because we did not receive all of your results. We reviewed the information we had. Your request has been denied. Your doctor can send us  any new or missing information for us  to review. For this drug, you may have to meet other criteria

## 2024-01-01 ENCOUNTER — Other Ambulatory Visit (HOSPITAL_COMMUNITY): Payer: Self-pay

## 2024-01-01 ENCOUNTER — Other Ambulatory Visit: Payer: Self-pay

## 2024-01-01 ENCOUNTER — Ambulatory Visit
Admission: RE | Admit: 2024-01-01 | Discharge: 2024-01-01 | Disposition: A | Discharge status: Home / Self Care | Source: Ambulatory Visit | Attending: Radiation Oncology | Admitting: Radiation Oncology

## 2024-01-01 DIAGNOSIS — C50411 Malignant neoplasm of upper-outer quadrant of right female breast: Secondary | ICD-10-CM | POA: Diagnosis not present

## 2024-01-01 DIAGNOSIS — Z170 Estrogen receptor positive status [ER+]: Secondary | ICD-10-CM

## 2024-01-01 LAB — RAD ONC ARIA SESSION SUMMARY
Course Elapsed Days: 2
Plan Fractions Treated to Date: 3
Plan Prescribed Dose Per Fraction: 2.67 Gy
Plan Total Fractions Prescribed: 15
Plan Total Prescribed Dose: 40.05 Gy
Reference Point Dosage Given to Date: 8.01 Gy
Reference Point Session Dosage Given: 2.67 Gy
Session Number: 3

## 2024-01-02 ENCOUNTER — Other Ambulatory Visit: Payer: Self-pay

## 2024-01-02 ENCOUNTER — Ambulatory Visit
Admission: RE | Admit: 2024-01-02 | Discharge: 2024-01-02 | Disposition: A | Discharge status: Home / Self Care | Source: Ambulatory Visit | Attending: Radiation Oncology | Admitting: Radiation Oncology

## 2024-01-02 DIAGNOSIS — C50411 Malignant neoplasm of upper-outer quadrant of right female breast: Secondary | ICD-10-CM | POA: Diagnosis not present

## 2024-01-02 LAB — RAD ONC ARIA SESSION SUMMARY
Course Elapsed Days: 3
Plan Fractions Treated to Date: 4
Plan Prescribed Dose Per Fraction: 2.67 Gy
Plan Total Fractions Prescribed: 15
Plan Total Prescribed Dose: 40.05 Gy
Reference Point Dosage Given to Date: 10.68 Gy
Reference Point Session Dosage Given: 2.67 Gy
Session Number: 4

## 2024-01-05 ENCOUNTER — Ambulatory Visit
Admission: RE | Admit: 2024-01-05 | Discharge: 2024-01-05 | Disposition: A | Discharge status: Home / Self Care | Source: Ambulatory Visit | Attending: Radiation Oncology | Admitting: Radiation Oncology

## 2024-01-05 ENCOUNTER — Other Ambulatory Visit: Payer: Self-pay

## 2024-01-05 DIAGNOSIS — Z510 Encounter for antineoplastic radiation therapy: Secondary | ICD-10-CM | POA: Insufficient documentation

## 2024-01-05 DIAGNOSIS — Z170 Estrogen receptor positive status [ER+]: Secondary | ICD-10-CM | POA: Diagnosis not present

## 2024-01-05 DIAGNOSIS — C50411 Malignant neoplasm of upper-outer quadrant of right female breast: Secondary | ICD-10-CM | POA: Diagnosis present

## 2024-01-05 LAB — RAD ONC ARIA SESSION SUMMARY
Course Elapsed Days: 6
Plan Fractions Treated to Date: 5
Plan Prescribed Dose Per Fraction: 2.67 Gy
Plan Total Fractions Prescribed: 15
Plan Total Prescribed Dose: 40.05 Gy
Reference Point Dosage Given to Date: 13.35 Gy
Reference Point Session Dosage Given: 2.67 Gy
Session Number: 5

## 2024-01-06 ENCOUNTER — Ambulatory Visit

## 2024-01-07 ENCOUNTER — Ambulatory Visit
Admission: RE | Admit: 2024-01-07 | Discharge: 2024-01-07 | Disposition: A | Discharge status: Home / Self Care | Source: Ambulatory Visit | Attending: Radiation Oncology | Admitting: Radiation Oncology

## 2024-01-07 ENCOUNTER — Other Ambulatory Visit: Payer: Self-pay

## 2024-01-07 ENCOUNTER — Ambulatory Visit

## 2024-01-07 ENCOUNTER — Ambulatory Visit (HOSPITAL_BASED_OUTPATIENT_CLINIC_OR_DEPARTMENT_OTHER)
Admission: RE | Admit: 2024-01-07 | Discharge: 2024-01-07 | Disposition: A | Discharge status: Home / Self Care | Source: Ambulatory Visit | Attending: Hematology and Oncology | Admitting: Hematology and Oncology

## 2024-01-07 DIAGNOSIS — C50411 Malignant neoplasm of upper-outer quadrant of right female breast: Secondary | ICD-10-CM | POA: Diagnosis present

## 2024-01-07 DIAGNOSIS — Z170 Estrogen receptor positive status [ER+]: Secondary | ICD-10-CM | POA: Insufficient documentation

## 2024-01-07 LAB — RAD ONC ARIA SESSION SUMMARY
Course Elapsed Days: 8
Plan Fractions Treated to Date: 6
Plan Prescribed Dose Per Fraction: 2.67 Gy
Plan Total Fractions Prescribed: 15
Plan Total Prescribed Dose: 40.05 Gy
Reference Point Dosage Given to Date: 16.02 Gy
Reference Point Session Dosage Given: 2.67 Gy
Session Number: 6

## 2024-01-08 ENCOUNTER — Ambulatory Visit: Payer: Self-pay | Admitting: Hematology and Oncology

## 2024-01-08 ENCOUNTER — Ambulatory Visit
Admission: RE | Admit: 2024-01-08 | Discharge: 2024-01-08 | Disposition: A | Discharge status: Home / Self Care | Source: Ambulatory Visit | Attending: Radiation Oncology | Admitting: Radiation Oncology

## 2024-01-08 ENCOUNTER — Other Ambulatory Visit: Payer: Self-pay

## 2024-01-08 DIAGNOSIS — C50411 Malignant neoplasm of upper-outer quadrant of right female breast: Secondary | ICD-10-CM | POA: Diagnosis not present

## 2024-01-08 LAB — RAD ONC ARIA SESSION SUMMARY
Course Elapsed Days: 9
Plan Fractions Treated to Date: 7
Plan Prescribed Dose Per Fraction: 2.67 Gy
Plan Total Fractions Prescribed: 15
Plan Total Prescribed Dose: 40.05 Gy
Reference Point Dosage Given to Date: 18.69 Gy
Reference Point Session Dosage Given: 2.67 Gy
Session Number: 7

## 2024-01-09 ENCOUNTER — Ambulatory Visit
Admission: RE | Admit: 2024-01-09 | Discharge: 2024-01-09 | Disposition: A | Discharge status: Home / Self Care | Source: Ambulatory Visit | Attending: Radiation Oncology | Admitting: Radiation Oncology

## 2024-01-09 ENCOUNTER — Other Ambulatory Visit: Payer: Self-pay

## 2024-01-09 DIAGNOSIS — C50411 Malignant neoplasm of upper-outer quadrant of right female breast: Secondary | ICD-10-CM | POA: Diagnosis not present

## 2024-01-09 LAB — RAD ONC ARIA SESSION SUMMARY
Course Elapsed Days: 10
Plan Fractions Treated to Date: 1
Plan Prescribed Dose Per Fraction: 2.67 Gy
Plan Total Fractions Prescribed: 8
Plan Total Prescribed Dose: 21.36 Gy
Reference Point Dosage Given to Date: 21.36 Gy
Reference Point Session Dosage Given: 2.67 Gy
Session Number: 8

## 2024-01-10 ENCOUNTER — Encounter: Payer: Self-pay | Admitting: Internal Medicine

## 2024-01-12 ENCOUNTER — Ambulatory Visit
Admission: RE | Admit: 2024-01-12 | Discharge: 2024-01-12 | Disposition: A | Discharge status: Home / Self Care | Source: Ambulatory Visit | Attending: Radiation Oncology | Admitting: Radiation Oncology

## 2024-01-12 ENCOUNTER — Other Ambulatory Visit: Payer: Self-pay

## 2024-01-12 DIAGNOSIS — C50411 Malignant neoplasm of upper-outer quadrant of right female breast: Secondary | ICD-10-CM | POA: Diagnosis not present

## 2024-01-12 LAB — RAD ONC ARIA SESSION SUMMARY
Course Elapsed Days: 13
Plan Fractions Treated to Date: 2
Plan Prescribed Dose Per Fraction: 2.67 Gy
Plan Total Fractions Prescribed: 8
Plan Total Prescribed Dose: 21.36 Gy
Reference Point Dosage Given to Date: 24.03 Gy
Reference Point Session Dosage Given: 2.67 Gy
Session Number: 9

## 2024-01-12 NOTE — Therapy (Unsigned)
 OUTPATIENT PHYSICAL THERAPY BREAST CANCER POST OP FOLLOW UP   Patient Name: [name] MRN: [id number] DOB:October 13, 1955, 68 y.o., female Today's Date: 01/12/2024  END OF SESSION:   Past Medical History:  Diagnosis Date   Arthritis    neck, knees, ankles, feet, back   Asthma, mild intermittent    prn inhaler   Breast cancer (HCC)    Cervical spondylosis    Chronic dyspnea    per pt gets winded w/ stairs, but recovers quickly   Diverticular disease of colon    followed by dr Tova Fresh (GI)---  chronic w/ chronic diarrhea   Essential hypertension    Fatty infiltration of liver    followed by dr Tova Fresh--- abd ultrasound in epic 08-31-2020   Frequency of urination    History of chest pain    evaluated by cardiologist-- dr Audery Blazing,  11/ 2017 nuclear stress test suggest ischemia,  06/2016 cardiac cath , showed normal coronary anatomy and lvf   History of COVID-19    per pt in 2019   treated with presumed covid had  mild to moderate symptoms that resolved   History of diverticulitis of colon    05/ 2022   History of TIA (transient ischemic attack) 06/2003   Hyperlipidemia    Hypertension    Limitation of joint motion of neck    per pt due to bone spurs C2-5   Mild intermittent asthma    followed by pcp   PMB (postmenopausal bleeding)    Pre-diabetes    Pulmonary nodule    followed by pcp,  last hest CT in epic 04-21-2018   Stenosis of cervix    SUI (stress urinary incontinence, female)    Thickened endometrium    TMJ syndrome    Wears glasses    Past Surgical History:  Procedure Laterality Date   APPENDECTOMY  1979   BREAST ENHANCEMENT SURGERY Bilateral 1988   per pt implants removed 2002   BUNIONECTOMY Left 07/17/2007   @[facility]    BUNIONECTOMY Right 05/26/2014   Procedure: RIGHT FOOT LAPIDUS BUNION CORRECTION AMD MODIFIED MCBRIDE BUNIONECTOMY;  Surgeon: Amada Backer, MD;  Location: [facility];  Service: Orthopedics;  Laterality: Right;   CARDIAC  CATHETERIZATION N/A 06/28/2016   Procedure: Left Heart Cath and Coronary Angiography;  Surgeon: Peter M Swaziland, MD;  Location: [facility] INVASIVE CV [facility];  Service: Cardiovascular;  Laterality: N/A;   CESAREAN SECTION  1986   COLONOSCOPY  12/27/2020   by dr Tova Fresh   FINGER SURGERY Left 07/28/2015   @[facility]  :  left thumb "knuckle collasped"  arthroplasty   FOOT HARDWARE REMOVAL  04/25/2016   @[facility] ;   right foot   FOOT SURGERY Right 04/04/2015   @[facility] ;   first and second toe's   GANGLION CYST EXCISION Right 2016   wrist   HYSTEROSCOPY WITH D & C N/A 05/24/2021   Procedure: DILATATION AND CURETTAGE /HYSTEROSCOPY and MyoSure;  Surgeon: Olin Bertin, DO;  Location: [facility];  Service: Gynecology;  Laterality: N/A;   NASAL SEPTUM SURGERY  1981   RADIOACTIVE SEED GUIDED AXILLARY SENTINEL LYMPH NODE Right 11/27/2023   Procedure: RIGHT BREAST LUMPECTOMY AFTER MAGNETIC SEED LOCALIZATION AND SENTINEL LYMPH NODE BIOPSY;  Surgeon: Dareen Ebbing, MD;  Location: [facility];  Service: General;  Laterality: Right;  GEN w/PEC BLOCK RIGHT BREAST SEED LOCALIZED LUMPECTOMY WITH AXILLARY SENTINEL LYMPH NODE BIOPSY   ROTATOR CUFF REPAIR W/ DISTAL CLAVICLE EXCISION Left 08/18/2008   @[facility]  by dr Mevelyn Acton  SHOULDER ARTHROSCOPY W/ LABRAL REPAIR Left 06/07/2008   @[facility] ;  and [facility]AD  by dr Mevelyn Acton   TONSILLECTOMY  1964   TUBAL LIGATION Bilateral 1999   Patient Active Problem List   Diagnosis Date Noted   Genetic testing 11/26/2023   Malignant neoplasm of upper-outer quadrant of right breast in female, estrogen receptor positive (HCC) 11/17/2023   At risk for Clostridium difficile infection 10/04/2022   Chronic renal disease, stage 2, mildly decreased glomerular filtration rate (GFR) between 60-89 mL/min/1.73 square meter 10/02/2022   Chronic fatigue 10/01/2022   Prediabetes 10/01/2022   Memory loss 07/05/2022   Hot flash, menopausal 10/23/2021   Cough variant asthma 06/19/2021   EIN  (endometrial intraepithelial neoplasia) 06/11/2021   Spondylolisthesis of lumbar region 04/29/2017   Spondylosis without myelopathy or radiculopathy, cervical region 04/29/2017   Pain from implanted hardware 04/04/2016   Morbid obesity (HCC) 03/19/2016   Primary osteoarthritis of first carpometacarpal joint of left hand 07/13/2015   History of TIA (transient ischemic attack) 03/14/2015   Metatarsalgia of right foot 03/08/2015   Routine general medical examination at a health care facility 11/23/2012   GERD 06/14/2009   Essential hypertension 06/16/2008   Hyperlipidemia 12/29/2007    PCP: ***  REFERRING PROVIDER: Colie Dawes, MD  REFERRING DIAG: s/p Right Breast Cancer  THERAPY DIAG:  No diagnosis found.  Rationale for Evaluation and Treatment: Rehabilitation  ONSET DATE: 10/24/2023  SUBJECTIVE:                                                                                                                                                                                           SUBJECTIVE STATEMENT: ***  PERTINENT HISTORY:  Patient was diagnosed on 10/24/2023 with right grade 2 invasive ductal carcinoma breast cancer. It measures 9 mm and is located in the upper outer quadrant. It is ER/PR positive and HER2 negative with a Ki67 of 20%. She had a Right lumpectomy with SLNB on 11/23/2023 with 0+/2 LN's. She developed a seroma in the axilla and lateral breast and had 160 c of fluid drained on 12/08/2023.  She had another 10 cc drained on 12/22/2023. She is presently undergoing radiation.She has a history of endometrial cancer in 2022 and a history of a TIA   PATIENT GOALS:  Reassess how my recovery is going related to arm function, pain, and swelling.  PAIN:  Are you having pain? {OPRCPAIN:27236}  PRECAUTIONS: Recent Surgery, right UE Lymphedema risk, Prior TIA  RED FLAGS: None   ACTIVITY LEVEL / LEISURE: walking   OBJECTIVE:   PATIENT SURVEYS:  QUICK  DASH: ***  OBSERVATIONS: ***  POSTURE:  Forward  head and rounded shoulders posture    LYMPHEDEMA ASSESSMENT:   UPPER EXTREMITY AROM/PROM:   A/PROM RIGHT   eval   RIGHT 01/13/2024  Shoulder extension 57   Shoulder flexion 160   Shoulder abduction 169   Shoulder internal rotation 67   Shoulder external rotation 82                           (Blank rows = not tested)   A/PROM LEFT   eval  Shoulder extension 48  Shoulder flexion 137  Shoulder abduction 168  Shoulder internal rotation 57  Shoulder external rotation 75                          (Blank rows = not tested)   CERVICAL AROM: All within normal limits   UPPER EXTREMITY STRENGTH: WNL   LYMPHEDEMA ASSESSMENTS (in cm):    LANDMARK RIGHT   eval RIGHT 01/13/2024  10 cm proximal to olecranon process 32.9   Olecranon process 25   10 cm proximal to ulnar styloid process 24.5   Just proximal to ulnar styloid process 16.9   Across hand at thumb web space 19.5   At base of 2nd digit 6.6   (Blank rows = not tested)   LANDMARK LEFT   eval LEFT 01/13/2024  10 cm proximal to olecranon process 35   Olecranon process 25.3   10 cm proximal to ulnar styloid process 23   Just proximal to ulnar styloid process 16.4   Across hand at thumb web space 18.1   At base of 2nd digit 6.5   (Blank rows = not tested)    Surgery type/Date: right breast lumpectomy and sentinel lymph node biopsy on 11/27/2023.  Number of lymph nodes removed: 0+/2 Current/past treatment (chemo, radiation, hormone therapy): *** Other symptoms:  Heaviness/tightness {yes/no:20286} Pain {yes/no:20286} Pitting edema {yes/no:20286} Infections {yes/no:20286} Decreased scar mobility {yes/no:20286} Stemmer sign {yes/no:20286}  PATIENT EDUCATION:  Education details: *** Person educated: {Person educated:25204} Education method: {Education Method:25205} Education comprehension: {Education Comprehension:25206}  HOME EXERCISE PROGRAM: Reviewed  previously given post op HEP. ***  ASSESSMENT:  CLINICAL IMPRESSION: ***  Pt will benefit from skilled therapeutic intervention to improve on the following deficits: Decreased knowledge of precautions, impaired UE functional use, pain, decreased ROM, postural dysfunction.   PT treatment/interventions: ADL/Self care home management, {rehab planned interventions:25118::"97110-Therapeutic exercises","97530- Therapeutic 202 730 9440- Neuromuscular re-education","97535- Self JXBJ","47829- Manual therapy"}   GOALS: Goals reviewed with patient? {yes/no:20286}  LONG TERM GOALS:  (STG=LTG)  GOALS Name Target Date  Goal status  1 Pt will demonstrate she has regained full shoulder ROM and function post operatively compared to baselines.  Baseline: *** INITIAL  2  *** INITIAL  3  *** INITIAL  4  *** INITIAL     PLAN:  PT FREQUENCY/DURATION: ***  PLAN FOR NEXT SESSION: ***   [facility]  [address], [address]  [place] [address]  [phone]  After Breast Cancer Class Video It is recommended you view the ABC class video to be educated on lymphedema risk reduction. This video lasts for about 30 minutes. It can be viewed on our website here: [email or web address]  Scar massage You can begin gentle scar massage to you incision sites. Gently place one hand on the incision and move the skin (without sliding on the skin) in various directions. Do this for a few minutes and then you can gently massage either  coconut oil or vitamin E cream into the scars.  Compression garment You should continue wearing your compression bra until you feel like you no longer have swelling.  Home exercise Program Continue doing the exercises you were given until you feel like you can do them without feeling any tightness at the end.   Walking Program Studies show that 30 minutes of walking per day (fast  enough to elevate your heart rate) can significantly reduce the risk of a cancer recurrence. If you can't walk due to other medical reasons, we encourage you to find another activity you could do (like a stationary bike or water exercise).  Posture After breast cancer surgery, people frequently sit with rounded shoulders posture because it puts their incisions on slack and feels better. If you sit like this and scar tissue forms in that position, you can become very tight and have pain sitting or standing with good posture. Try to be aware of your posture and sit and stand up tall to heal properly.  Follow up PT: It is recommended you return every 3 months for the first 3 years following surgery to be assessed on the SOZO machine for an L-Dex score. This helps prevent clinically significant lymphedema in 95% of patients. These follow up screens are 10 minute appointments that you are not billed for.  Latisha Poland, PT 01/12/2024, 7:17 AM

## 2024-01-13 ENCOUNTER — Ambulatory Visit
Admission: RE | Admit: 2024-01-13 | Discharge: 2024-01-13 | Disposition: A | Discharge status: Home / Self Care | Source: Ambulatory Visit | Attending: Radiation Oncology | Admitting: Radiation Oncology

## 2024-01-13 ENCOUNTER — Ambulatory Visit: Attending: Radiation Oncology

## 2024-01-13 ENCOUNTER — Other Ambulatory Visit: Payer: Self-pay

## 2024-01-13 ENCOUNTER — Ambulatory Visit: Admitting: Radiation Oncology

## 2024-01-13 DIAGNOSIS — R293 Abnormal posture: Secondary | ICD-10-CM | POA: Insufficient documentation

## 2024-01-13 DIAGNOSIS — G8929 Other chronic pain: Secondary | ICD-10-CM | POA: Diagnosis not present

## 2024-01-13 DIAGNOSIS — I890 Lymphedema, not elsewhere classified: Secondary | ICD-10-CM | POA: Insufficient documentation

## 2024-01-13 DIAGNOSIS — C50411 Malignant neoplasm of upper-outer quadrant of right female breast: Secondary | ICD-10-CM | POA: Diagnosis present

## 2024-01-13 DIAGNOSIS — M25511 Pain in right shoulder: Secondary | ICD-10-CM | POA: Diagnosis not present

## 2024-01-13 DIAGNOSIS — Z170 Estrogen receptor positive status [ER+]: Secondary | ICD-10-CM | POA: Insufficient documentation

## 2024-01-13 DIAGNOSIS — N63 Unspecified lump in unspecified breast: Secondary | ICD-10-CM

## 2024-01-13 LAB — RAD ONC ARIA SESSION SUMMARY
Course Elapsed Days: 14
Plan Fractions Treated to Date: 3
Plan Prescribed Dose Per Fraction: 2.67 Gy
Plan Total Fractions Prescribed: 8
Plan Total Prescribed Dose: 21.36 Gy
Reference Point Dosage Given to Date: 26.7 Gy
Reference Point Session Dosage Given: 2.67 Gy
Session Number: 10

## 2024-01-13 NOTE — Patient Instructions (Signed)
 [facility]  [address], [address]  [place] [address]  [phone]  After Breast Cancer Class Video It is recommended you view the ABC class video to be educated on lymphedema risk reduction. This video lasts for about 30 minutes. It can be viewed on our website here: [email or web address]  Scar massage You can begin gentle scar massage to you incision sites. Gently place one hand on the incision and move the skin (without sliding on the skin) in various directions. Do this for a few minutes and then you can gently massage either coconut oil or vitamin E cream into the scars.  Compression garment You should continue wearing your compression bra until you feel like you no longer have swelling.  Home exercise Program Continue doing the exercises you were given until you feel like you can do them without feeling any tightness at the end.   Walking Program Studies show that 30 minutes of walking per day (fast enough to elevate your heart rate) can significantly reduce the risk of a cancer recurrence. If you can't walk due to other medical reasons, we encourage you to find another activity you could do (like a stationary bike or water exercise).  Posture After breast cancer surgery, people frequently sit with rounded shoulders posture because it puts their incisions on slack and feels better. If you sit like this and scar tissue forms in that position, you can become very tight and have pain sitting or standing with good posture. Try to be aware of your posture and sit and stand up tall to heal properly.  Follow up PT: It is recommended you return every 3 months for the first 3 years following surgery to be assessed on the SOZO machine for an L-Dex score. This helps prevent clinically significant lymphedema in 95% of patients. These follow up screens are 10 minute appointments that you are not billed  for.

## 2024-01-14 ENCOUNTER — Other Ambulatory Visit: Payer: Self-pay

## 2024-01-14 ENCOUNTER — Ambulatory Visit
Admission: RE | Admit: 2024-01-14 | Discharge: 2024-01-14 | Disposition: A | Discharge status: Home / Self Care | Source: Ambulatory Visit | Attending: Radiation Oncology | Admitting: Radiation Oncology

## 2024-01-14 DIAGNOSIS — C50411 Malignant neoplasm of upper-outer quadrant of right female breast: Secondary | ICD-10-CM | POA: Diagnosis not present

## 2024-01-14 LAB — RAD ONC ARIA SESSION SUMMARY
Course Elapsed Days: 15
Plan Fractions Treated to Date: 4
Plan Prescribed Dose Per Fraction: 2.67 Gy
Plan Total Fractions Prescribed: 8
Plan Total Prescribed Dose: 21.36 Gy
Reference Point Dosage Given to Date: 29.37 Gy
Reference Point Session Dosage Given: 2.67 Gy
Session Number: 11

## 2024-01-15 ENCOUNTER — Other Ambulatory Visit: Payer: Self-pay

## 2024-01-15 ENCOUNTER — Telehealth: Payer: Self-pay

## 2024-01-15 ENCOUNTER — Ambulatory Visit

## 2024-01-15 ENCOUNTER — Ambulatory Visit
Admission: RE | Admit: 2024-01-15 | Discharge: 2024-01-15 | Disposition: A | Discharge status: Home / Self Care | Source: Ambulatory Visit | Attending: Radiation Oncology | Admitting: Radiation Oncology

## 2024-01-15 ENCOUNTER — Other Ambulatory Visit (HOSPITAL_COMMUNITY): Payer: Self-pay

## 2024-01-15 DIAGNOSIS — G8929 Other chronic pain: Secondary | ICD-10-CM

## 2024-01-15 DIAGNOSIS — C50411 Malignant neoplasm of upper-outer quadrant of right female breast: Secondary | ICD-10-CM

## 2024-01-15 DIAGNOSIS — R293 Abnormal posture: Secondary | ICD-10-CM

## 2024-01-15 DIAGNOSIS — I890 Lymphedema, not elsewhere classified: Secondary | ICD-10-CM

## 2024-01-15 DIAGNOSIS — N63 Unspecified lump in unspecified breast: Secondary | ICD-10-CM

## 2024-01-15 LAB — RAD ONC ARIA SESSION SUMMARY
Course Elapsed Days: 16
Plan Fractions Treated to Date: 5
Plan Prescribed Dose Per Fraction: 2.67 Gy
Plan Total Fractions Prescribed: 8
Plan Total Prescribed Dose: 21.36 Gy
Reference Point Dosage Given to Date: 32.04 Gy
Reference Point Session Dosage Given: 2.67 Gy
Session Number: 12

## 2024-01-15 NOTE — Therapy (Signed)
 OUTPATIENT PHYSICAL THERAPY BREAST CANCER TREATMENT   Patient Name: [name] MRN: [id number] DOB:1956/04/10, 68 y.o., female Today's Date: 01/15/2024  END OF SESSION:  PT End of Session - 01/15/24 0808     Visit Number 3    Number of Visits 14    Date for PT Re-Evaluation 02/24/24    PT Start Time 0758    PT Stop Time 0857    PT Time Calculation (min) 59 min    Activity Tolerance Patient tolerated treatment well    Behavior During Therapy [facility] for tasks assessed/performed          Past Medical History:  Diagnosis Date   Arthritis    neck, knees, ankles, feet, back   Asthma, mild intermittent    prn inhaler   Breast cancer (HCC)    Cervical spondylosis    Chronic dyspnea    per pt gets winded w/ stairs, but recovers quickly   Diverticular disease of colon    followed by dr Tova Fresh (GI)---  chronic w/ chronic diarrhea   Essential hypertension    Fatty infiltration of liver    followed by dr Tova Fresh--- abd ultrasound in epic 08-31-2020   Frequency of urination    History of chest pain    evaluated by cardiologist-- dr Audery Blazing,  11/ 2017 nuclear stress test suggest ischemia,  06/2016 cardiac cath , showed normal coronary anatomy and lvf   History of COVID-19    per pt in 2019   treated with presumed covid had  mild to moderate symptoms that resolved   History of diverticulitis of colon    05/ 2022   History of TIA (transient ischemic attack) 06/2003   Hyperlipidemia    Hypertension    Limitation of joint motion of neck    per pt due to bone spurs C2-5   Mild intermittent asthma    followed by pcp   PMB (postmenopausal bleeding)    Pre-diabetes    Pulmonary nodule    followed by pcp,  last hest CT in epic 04-21-2018   Stenosis of cervix    SUI (stress urinary incontinence, female)    Thickened endometrium    TMJ syndrome    Wears glasses    Past Surgical History:  Procedure Laterality Date   APPENDECTOMY  1979   BREAST ENHANCEMENT SURGERY Bilateral 1988    per pt implants removed 2002   BUNIONECTOMY Left 07/17/2007   @[facility]    BUNIONECTOMY Right 05/26/2014   Procedure: RIGHT FOOT LAPIDUS BUNION CORRECTION AMD MODIFIED MCBRIDE BUNIONECTOMY;  Surgeon: Amada Backer, MD;  Location: [facility];  Service: Orthopedics;  Laterality: Right;   CARDIAC CATHETERIZATION N/A 06/28/2016   Procedure: Left Heart Cath and Coronary Angiography;  Surgeon: Peter M Swaziland, MD;  Location: [facility] INVASIVE CV LAB;  Service: Cardiovascular;  Laterality: N/A;   CESAREAN SECTION  1986   COLONOSCOPY  12/27/2020   by dr Tova Fresh   FINGER SURGERY Left 07/28/2015   @[facility]  :  left thumb knuckle collasped  arthroplasty   FOOT HARDWARE REMOVAL  04/25/2016   @[facility] ;   right foot   FOOT SURGERY Right 04/04/2015   @[facility] ;   first and second toe's   GANGLION CYST EXCISION Right 2016   wrist   HYSTEROSCOPY WITH D & C N/A 05/24/2021   Procedure: DILATATION AND CURETTAGE /HYSTEROSCOPY and MyoSure;  Surgeon: Olin Bertin, DO;  Location: [facility];  Service: Gynecology;  Laterality: N/A;   NASAL SEPTUM SURGERY  1981  RADIOACTIVE SEED GUIDED AXILLARY SENTINEL LYMPH NODE Right 11/27/2023   Procedure: RIGHT BREAST LUMPECTOMY AFTER MAGNETIC SEED LOCALIZATION AND SENTINEL LYMPH NODE BIOPSY;  Surgeon: Dareen Ebbing, MD;  Location: [facility];  Service: General;  Laterality: Right;  GEN w/PEC BLOCK RIGHT BREAST SEED LOCALIZED LUMPECTOMY WITH AXILLARY SENTINEL LYMPH NODE BIOPSY   ROTATOR CUFF REPAIR W/ DISTAL CLAVICLE EXCISION Left 08/18/2008   @[facility]  by dr Mevelyn Acton   SHOULDER ARTHROSCOPY W/ LABRAL REPAIR Left 06/07/2008   @[facility] ;  and [facility]AD  by dr Mevelyn Acton   TONSILLECTOMY  1964   TUBAL LIGATION Bilateral 1999   Patient Active Problem List   Diagnosis Date Noted   Genetic testing 11/26/2023   Malignant neoplasm of upper-outer quadrant of right breast in female, estrogen receptor positive (HCC) 11/17/2023   At risk for Clostridium difficile  infection 10/04/2022   Chronic renal disease, stage 2, mildly decreased glomerular filtration rate (GFR) between 60-89 mL/min/1.73 square meter 10/02/2022   Chronic fatigue 10/01/2022   Prediabetes 10/01/2022   Memory loss 07/05/2022   Hot flash, menopausal 10/23/2021   Cough variant asthma 06/19/2021   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/11/2021   Spondylolisthesis of lumbar region 04/29/2017   Spondylosis without myelopathy or radiculopathy, cervical region 04/29/2017   Pain from implanted hardware 04/04/2016   Morbid obesity (HCC) 03/19/2016   Primary osteoarthritis of first carpometacarpal joint of left hand 07/13/2015   History of TIA (transient ischemic attack) 03/14/2015   Metatarsalgia of right foot 03/08/2015   Routine general medical examination at a health care facility 11/23/2012   GERD 06/14/2009   Essential hypertension 06/16/2008   Hyperlipidemia 12/29/2007    PCP:   REFERRING PROVIDER: Colie Dawes, MD  REFERRING DIAG: s/p Right Breast Cancer  THERAPY DIAG:  Malignant neoplasm of upper-outer quadrant of right breast in female, estrogen receptor positive (HCC)  Abnormal posture  Chronic right shoulder pain  Swelling of breast  Lymphedema, not elsewhere classified  Rationale for Evaluation and Treatment: Rehabilitation  ONSET DATE: 01/06/2024 SUBJECTIVE:                                                                                                                                                                                           SUBJECTIVE STATEMENT: I can feel the pulling on the inside of my upper arm from where she (Robin) told me the cording is.   PERTINENT HISTORY:  Patient was diagnosed on 10/24/2023 with right grade 2 invasive ductal carcinoma breast cancer. It measures 9 mm and is located in the upper outer quadrant. It is ER/PR positive and HER2 negative with a Ki67 of 20%. She had  a Right lumpectomy with SLNB on 11/27/2023 with 0+/2  LN's. She developed a seroma in the axilla and lateral breast and had 160 c of fluid drained on 12/08/2023.  % days later they drained and got 120 cc,She had another 10 cc drained on 12/22/2023. She is presently undergoing radiation.She has a history of endometrial cancer in 2022 and a history of a TIA   PATIENT GOALS:  Reassess how my recovery is going related to arm function, pain, and swelling.  PAIN:  Are you having pain? None currently, only hurts when I have to put it in radiation position  PRECAUTIONS: Recent Surgery, right UE Lymphedema risk, Prior TIA  RED FLAGS: None   ACTIVITY LEVEL / LEISURE: walking   OBJECTIVE:   PATIENT SURVEYS:  QUICK DASH: 31.82  OBSERVATIONS: Large area of swelling with induration right upper breast  POSTURE:  Forward head and rounded shoulders posture    LYMPHEDEMA ASSESSMENT:   UPPER EXTREMITY AROM/PROM:   A/PROM RIGHT   eval   RIGHT 01/13/2024  Shoulder extension 57 45  Shoulder flexion 160 140  Shoulder abduction 169 115 tight under arm  Shoulder internal rotation 67 45  Shoulder external rotation 82 60                          (Blank rows = not tested)   A/PROM LEFT   eval  Shoulder extension 48  Shoulder flexion 137  Shoulder abduction 168  Shoulder internal rotation 57  Shoulder external rotation 75                          (Blank rows = not tested)   CERVICAL AROM: All within normal limits   UPPER EXTREMITY STRENGTH: WNL   LYMPHEDEMA ASSESSMENTS (in cm):    LANDMARK RIGHT   eval RIGHT 01/13/2024  10 cm proximal to olecranon process 32.9 33.8  Olecranon process 25 24.7  10 cm proximal to ulnar styloid process 24.5 23.0  Just proximal to ulnar styloid process 16.9 17.4  Across hand at thumb web space 19.5 19.6  At base of 2nd digit 6.6 6.3  (Blank rows = not tested)   LANDMARK LEFT   eval LEFT 01/13/2024  10 cm proximal to olecranon process 35 34.6  Olecranon process 25.3 25.5  10 cm proximal to ulnar  styloid process 23 22.7  Just proximal to ulnar styloid process 16.4 16.5  Across hand at thumb web space 18.1 18.7  At base of 2nd digit 6.5 6.4  (Blank rows = not tested)    Surgery type/Date: right breast lumpectomy and sentinel lymph node biopsy on 11/27/2023.  Number of lymph nodes removed: 0+/2 Current/past treatment (chemo, radiation, hormone therapy): radiation Other symptoms:  Heaviness/tightness No Pain Yes Pitting edema No Infections No Decreased scar mobility Yes Stemmer sign No   TODAY'S TREATMENT: 01/15/24: Therapeutic Exercises Pulleys into flex and scaption x 2 mins each returning therapist demo and tactile and VC's throughout to decrease Rt scapular compensation Roll yellow ball up wall into flex x 7 and stopped due increasing Rt posterior shoulder discomfort Manual Therapy MLD to Rt breast in supine and began instructing pt in lymphatic system and basic principles of MLD while performing: Short neck, superficial and deep abdominals, Lt axillary and pectoral nodes, anterior intact thorax sequence, and anterior inter-axillary anastomosis, then Rt inguinal nodes, Rt axillo-inguinal anastomosis and then focused on Rt medial and superior breast, then  into Lt S/L for lateral and inferior breast redirecting towards lateral anastomosis then finished retracing all steps in supine P/ROM to Rt shoulder in supine during MLD into flex, abd and D2 to pts tolerance and scapular depression by therapist throughout  MFR to multiple cords in Rt axilla and as pt could tolerate STM to Rt UT in supine and S/L Scap Mobs to Rt scapula in S/L into protraction and retraction  PATIENT EDUCATION:  Education details: POC, LOS, treatment interventions, 4 post op exercises, NTS for cords Person educated: Patient Education method: Programmer, multimedia, Demonstration, Verbal cues, and Handouts Education comprehension: verbalized understanding, returned demonstration, and verbal cues required  HOME  EXERCISE PROGRAM: Instructed 4 post op exercises with pt holding Right wrist with left hand for AA flexion in supine, supine stargazer, scapular retraction, wall slides. Showed wrist extension with elbow ext and gentle shoulder ext to stretch cords. Advised to do with appropriate tightness only and not to force. Should not have pain in shoulder. To perform 2x's per day. Also educated in scar massage to incision area, and advised to watch ABC video.   ASSESSMENT:  CLINICAL IMPRESSION: Began AA/ROM working to instruct pt in scapular depression with shoulder ROM. Then began manual therapy including MLD and MFR.   Pt will benefit from skilled therapeutic intervention to improve on the following deficits: Decreased knowledge of precautions, impaired UE functional use, pain, decreased ROM, postural dysfunction.   PT treatment/interventions: ADL/Self care home management, [phone]- PT Re-evaluation, [phone]- Physical Performance Testing, 97110-Therapeutic exercises, 97530- Therapeutic activity, [id number]- Neuromuscular re-education, 97535- Self Care, [id number]- Manual therapy, 97760- Orthotic Initial, [phone]- Orthotic/Prosthetic subsequent, and Patient/Family education   GOALS: Goals reviewed with patient? Yes  LONG TERM GOALS:  (STG=LTG)  GOALS Name Target Date  Goal status  1 Pt will demonstrate she has regained full shoulder ROM and function post operatively compared to baselines.  Baseline: 02/24/2024 INITIAL  2 Pt will be independent in Right shoulder ROM and strengthening 02/24/2024 INITIAL  3 Pt will be independent in right breast MLD to decrease breast swelling 02/24/2024 INITIAL  4 Pt will be compliant with compression bra to decrease right breast swelling 02/24/2024 INITIAL     PLAN:  PT FREQUENCY/DURATION: 2x/week x 6 weeks  PLAN FOR NEXT SESSION: Review HEP, is pt wearing compression bra, right shoulder strength, Cont and instruct MLD and cont MFR to cords, review scar massage   [facility]  [address], [address]  [place] [address]  [phone]   Denyce Flank, PTA 01/15/2024, 9:04 AM

## 2024-01-15 NOTE — Telephone Encounter (Signed)
 Pharmacy Patient Advocate Encounter  Received notification from [facility] that Prior Authorization for Zepbound  10MG /0.5ML pen-injectors  has been APPROVED from 01/15/24 to 01/14/25. Ran test claim, Copay is $0. This test claim was processed through [facility]- copay amounts may vary at other pharmacies due to pharmacy/plan contracts, or as the patient moves through the different stages of their insurance plan.   PA #/Case ID/Reference #: [id number]

## 2024-01-15 NOTE — Telephone Encounter (Signed)
 Pharmacy Patient Advocate Encounter   Received notification from CoverMyMeds that prior authorization for Zepbound  10MG /0.5ML pen-injectors  is required/requested.   Insurance verification completed.   The patient is insured through [facility] .   Per test claim: PA required; PA submitted to above mentioned insurance via CoverMyMeds Key/confirmation #/EOC [id number] Status is pending

## 2024-01-16 ENCOUNTER — Other Ambulatory Visit (HOSPITAL_COMMUNITY): Payer: Self-pay

## 2024-01-16 ENCOUNTER — Ambulatory Visit
Admission: RE | Admit: 2024-01-16 | Discharge: 2024-01-16 | Disposition: A | Discharge status: Home / Self Care | Source: Ambulatory Visit | Attending: Radiation Oncology | Admitting: Radiation Oncology

## 2024-01-16 ENCOUNTER — Other Ambulatory Visit: Payer: Self-pay

## 2024-01-16 DIAGNOSIS — C50411 Malignant neoplasm of upper-outer quadrant of right female breast: Secondary | ICD-10-CM | POA: Diagnosis not present

## 2024-01-16 LAB — RAD ONC ARIA SESSION SUMMARY
Course Elapsed Days: 17
Plan Fractions Treated to Date: 6
Plan Prescribed Dose Per Fraction: 2.67 Gy
Plan Total Fractions Prescribed: 8
Plan Total Prescribed Dose: 21.36 Gy
Reference Point Dosage Given to Date: 34.71 Gy
Reference Point Session Dosage Given: 2.67 Gy
Session Number: 13

## 2024-01-19 ENCOUNTER — Ambulatory Visit
Admission: RE | Admit: 2024-01-19 | Discharge: 2024-01-19 | Disposition: A | Discharge status: Home / Self Care | Source: Ambulatory Visit | Attending: Radiation Oncology | Admitting: Radiation Oncology

## 2024-01-19 ENCOUNTER — Ambulatory Visit

## 2024-01-19 ENCOUNTER — Other Ambulatory Visit: Payer: Self-pay

## 2024-01-19 DIAGNOSIS — Z170 Estrogen receptor positive status [ER+]: Secondary | ICD-10-CM

## 2024-01-19 DIAGNOSIS — R293 Abnormal posture: Secondary | ICD-10-CM

## 2024-01-19 DIAGNOSIS — G8929 Other chronic pain: Secondary | ICD-10-CM

## 2024-01-19 DIAGNOSIS — C50411 Malignant neoplasm of upper-outer quadrant of right female breast: Secondary | ICD-10-CM | POA: Diagnosis not present

## 2024-01-19 DIAGNOSIS — I890 Lymphedema, not elsewhere classified: Secondary | ICD-10-CM

## 2024-01-19 DIAGNOSIS — N63 Unspecified lump in unspecified breast: Secondary | ICD-10-CM

## 2024-01-19 LAB — RAD ONC ARIA SESSION SUMMARY
Course Elapsed Days: 20
Plan Fractions Treated to Date: 7
Plan Prescribed Dose Per Fraction: 2.67 Gy
Plan Total Fractions Prescribed: 8
Plan Total Prescribed Dose: 21.36 Gy
Reference Point Dosage Given to Date: 37.38 Gy
Reference Point Session Dosage Given: 2.67 Gy
Session Number: 14

## 2024-01-19 NOTE — Therapy (Signed)
 OUTPATIENT PHYSICAL THERAPY BREAST CANCER TREATMENT   Patient Name: [name] MRN: [id number] DOB:[birth year], 68 y.o., female Today's Date: 01/19/2024  END OF SESSION:  PT End of Session - 01/19/24 0807     Visit Number 4    Number of Visits 14    Date for PT Re-Evaluation 02/24/24    PT Start Time 0802    PT Stop Time 0858    PT Time Calculation (min) 56 min    Activity Tolerance Patient tolerated treatment well    Behavior During Therapy [facility] for tasks assessed/performed;Agitated   agitation due to ankle bites, ice helped to calm this down so pt could lay still for session         Past Medical History:  Diagnosis Date   Arthritis    neck, knees, ankles, feet, back   Asthma, mild intermittent    prn inhaler   Breast cancer (HCC)    Cervical spondylosis    Chronic dyspnea    per pt gets winded w/ stairs, but recovers quickly   Diverticular disease of colon    followed by dr Tova Fresh (GI)---  chronic w/ chronic diarrhea   Essential hypertension    Fatty infiltration of liver    followed by dr Tova Fresh--- abd ultrasound in epic 08-31-2020   Frequency of urination    History of chest pain    evaluated by cardiologist-- dr Audery Blazing,  11/ 2017 nuclear stress test suggest ischemia,  06/2016 cardiac cath , showed normal coronary anatomy and lvf   History of COVID-19    per pt in 2019   treated with presumed covid had  mild to moderate symptoms that resolved   History of diverticulitis of colon    05/ 2022   History of TIA (transient ischemic attack) 06/2003   Hyperlipidemia    Hypertension    Limitation of joint motion of neck    per pt due to bone spurs C2-5   Mild intermittent asthma    followed by pcp   PMB (postmenopausal bleeding)    Pre-diabetes    Pulmonary nodule    followed by pcp,  last hest CT in epic 04-21-2018   Stenosis of cervix    SUI (stress urinary incontinence, female)    Thickened endometrium    TMJ syndrome    Wears glasses    Past Surgical  History:  Procedure Laterality Date   APPENDECTOMY  1979   BREAST ENHANCEMENT SURGERY Bilateral 1988   per pt implants removed 2002   BUNIONECTOMY Left 07/17/2007   @[facility]    BUNIONECTOMY Right 05/26/2014   Procedure: RIGHT FOOT LAPIDUS BUNION CORRECTION AMD MODIFIED MCBRIDE BUNIONECTOMY;  Surgeon: Amada Backer, MD;  Location: [facility];  Service: Orthopedics;  Laterality: Right;   CARDIAC CATHETERIZATION N/A 06/28/2016   Procedure: Left Heart Cath and Coronary Angiography;  Surgeon: Peter M Swaziland, MD;  Location: [facility];  Service: Cardiovascular;  Laterality: N/A;   CESAREAN SECTION  1986   COLONOSCOPY  12/27/2020   by dr Tova Fresh   FINGER SURGERY Left 07/28/2015   @[facility]  :  left thumb knuckle collasped  arthroplasty   FOOT HARDWARE REMOVAL  04/25/2016   @[facility] ;   right foot   FOOT SURGERY Right 04/04/2015   @[facility] ;   first and second toe's   GANGLION CYST EXCISION Right 2016   wrist   HYSTEROSCOPY WITH D & C N/A 05/24/2021   Procedure: DILATATION AND CURETTAGE /HYSTEROSCOPY and MyoSure;  Surgeon: Richard Champion A, DO;  Location: [facility];  Service: Gynecology;  Laterality: N/A;   NASAL SEPTUM SURGERY  1981   RADIOACTIVE SEED GUIDED AXILLARY SENTINEL LYMPH NODE Right 11/27/2023   Procedure: RIGHT BREAST LUMPECTOMY AFTER MAGNETIC SEED LOCALIZATION AND SENTINEL LYMPH NODE BIOPSY;  Surgeon: Dareen Ebbing, MD;  Location: [facility];  Service: General;  Laterality: Right;  GEN w/PEC BLOCK RIGHT BREAST SEED LOCALIZED LUMPECTOMY WITH AXILLARY SENTINEL LYMPH NODE BIOPSY   ROTATOR CUFF REPAIR W/ DISTAL CLAVICLE EXCISION Left 08/18/2008   @[facility]  by dr Mevelyn Acton   SHOULDER ARTHROSCOPY W/ LABRAL REPAIR Left 06/07/2008   @[facility] ;  and [facility]AD  by dr Mevelyn Acton   TONSILLECTOMY  1964   TUBAL LIGATION Bilateral 1999   Patient Active Problem List   Diagnosis Date Noted   Genetic testing 11/26/2023   Malignant neoplasm of upper-outer quadrant of right  breast in female, estrogen receptor positive (HCC) 11/17/2023   At risk for Clostridium difficile infection 10/04/2022   Chronic renal disease, stage 2, mildly decreased glomerular filtration rate (GFR) between 60-89 mL/min/1.73 square meter 10/02/2022   Chronic fatigue 10/01/2022   Prediabetes 10/01/2022   Memory loss 07/05/2022   Hot flash, menopausal 10/23/2021   Cough variant asthma 06/19/2021   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/11/2021   Spondylolisthesis of lumbar region 04/29/2017   Spondylosis without myelopathy or radiculopathy, cervical region 04/29/2017   Pain from implanted hardware 04/04/2016   Morbid obesity (HCC) 03/19/2016   Primary osteoarthritis of first carpometacarpal joint of left hand 07/13/2015   History of TIA (transient ischemic attack) 03/14/2015   Metatarsalgia of right foot 03/08/2015   Routine general medical examination at a health care facility 11/23/2012   GERD 06/14/2009   Essential hypertension 06/16/2008   Hyperlipidemia 12/29/2007    PCP:   REFERRING PROVIDER: Colie Dawes, MD  REFERRING DIAG: s/p Right Breast Cancer  THERAPY DIAG:  Malignant neoplasm of upper-outer quadrant of right breast in female, estrogen receptor positive (HCC)  Abnormal posture  Chronic right shoulder pain  Swelling of breast  Lymphedema, not elsewhere classified  Rationale for Evaluation and Treatment: Rehabilitation  ONSET DATE: 01/06/2024 SUBJECTIVE:                                                                                                                                                                                           SUBJECTIVE STATEMENT: I stepped in an ant hill Saturday and my ankles are covered in bites. Can we put ice on my ankles? The spot is already not as hard as it was last week, so I can already tell a big improvement. The tightness from the cording  is still the same though, it's just very tender under my arm.   PERTINENT  HISTORY:  Patient was diagnosed on 10/24/2023 with right grade 2 invasive ductal carcinoma breast cancer. It measures 9 mm and is located in the upper outer quadrant. It is ER/PR positive and HER2 negative with a Ki67 of 20%. She had a Right lumpectomy with SLNB on 11/27/2023 with 0+/2 LN's. She developed a seroma in the axilla and lateral breast and had 160 c of fluid drained on 12/08/2023.  5 days later they drained and got 120 cc,She had another 10 cc drained on 12/22/2023. She is presently undergoing radiation.She has a history of endometrial cancer in 2022 and a history of a TIA   PATIENT GOALS:  Reassess how my recovery is going related to arm function, pain, and swelling.  PAIN:  Are you having pain? No, just my ankles from the ant bites  PRECAUTIONS: Recent Surgery, right UE Lymphedema risk, Prior TIA  RED FLAGS: None   ACTIVITY LEVEL / LEISURE: walking   OBJECTIVE:   PATIENT SURVEYS:  QUICK DASH: 31.82  OBSERVATIONS: Large area of swelling with induration right upper breast  POSTURE:  Forward head and rounded shoulders posture    LYMPHEDEMA ASSESSMENT:   UPPER EXTREMITY AROM/PROM:   A/PROM RIGHT   eval   RIGHT 01/13/2024  Shoulder extension 57 45  Shoulder flexion 160 140  Shoulder abduction 169 115 tight under arm  Shoulder internal rotation 67 45  Shoulder external rotation 82 60                          (Blank rows = not tested)   A/PROM LEFT   eval  Shoulder extension 48  Shoulder flexion 137  Shoulder abduction 168  Shoulder internal rotation 57  Shoulder external rotation 75                          (Blank rows = not tested)   CERVICAL AROM: All within normal limits   UPPER EXTREMITY STRENGTH: WNL   LYMPHEDEMA ASSESSMENTS (in cm):    LANDMARK RIGHT   eval RIGHT 01/13/2024  10 cm proximal to olecranon process 32.9 33.8  Olecranon process 25 24.7  10 cm proximal to ulnar styloid process 24.5 23.0  Just proximal to ulnar styloid process 16.9 17.4   Across hand at thumb web space 19.5 19.6  At base of 2nd digit 6.6 6.3  (Blank rows = not tested)   LANDMARK LEFT   eval LEFT 01/13/2024  10 cm proximal to olecranon process 35 34.6  Olecranon process 25.3 25.5  10 cm proximal to ulnar styloid process 23 22.7  Just proximal to ulnar styloid process 16.4 16.5  Across hand at thumb web space 18.1 18.7  At base of 2nd digit 6.5 6.4  (Blank rows = not tested)    Surgery type/Date: right breast lumpectomy and sentinel lymph node biopsy on 11/27/2023.  Number of lymph nodes removed: 0+/2 Current/past treatment (chemo, radiation, hormone therapy): radiation Other symptoms:  Heaviness/tightness No Pain Yes Pitting edema No Infections No Decreased scar mobility Yes Stemmer sign No   TODAY'S TREATMENT: 01/19/24: Manual Therapy MLD to Rt breast in supine: Short neck, superficial and deep abdominals, Lt axillary and pectoral nodes, anterior intact thorax sequence, and anterior inter-axillary anastomosis, then Rt inguinal nodes, Rt axillo-inguinal anastomosis and then focused on Rt medial and superior breast, then into Lt S/L  for lateral and inferior breast redirecting towards lateral anastomosis then finished retracing all steps in supine P/ROM to Rt shoulder in supine during MLD into flex, abd and D2 to pts tolerance and scapular depression by therapist throughout  MFR to multiple cords in Rt axilla and as pt could tolerate, she is very tender to touch in medial upper arm over cording Therapeutic Exercises Pulleys into flex x 2 mins, VC's to remind pt to decrease scap compensation Roll yellow ball up wall into flex x 7, pt with no shoulder discomfort with this today   01/15/24: Therapeutic Exercises Pulleys into flex and scaption x 2 mins each returning therapist demo and tactile and VC's throughout to decrease Rt scapular compensation Roll yellow ball up wall into flex x 7 and stopped due increasing Rt posterior shoulder  discomfort Manual Therapy MLD to Rt breast in supine and began instructing pt in lymphatic system and basic principles of MLD while performing: Short neck, superficial and deep abdominals, Lt axillary and pectoral nodes, anterior intact thorax sequence, and anterior inter-axillary anastomosis, then Rt inguinal nodes, Rt axillo-inguinal anastomosis and then focused on Rt medial and superior breast, then into Lt S/L for lateral and inferior breast redirecting towards lateral anastomosis then finished retracing all steps in supine P/ROM to Rt shoulder in supine during MLD into flex, abd and D2 to pts tolerance and scapular depression by therapist throughout  MFR to multiple cords in Rt axilla and as pt could tolerate STM to Rt UT in supine and S/L Scap Mobs to Rt scapula in S/L into protraction and retraction  PATIENT EDUCATION:  Education details: POC, LOS, treatment interventions, 4 post op exercises, NTS for cords Person educated: Patient Education method: Programmer, multimedia, Demonstration, Verbal cues, and Handouts Education comprehension: verbalized understanding, returned demonstration, and verbal cues required  HOME EXERCISE PROGRAM: Instructed 4 post op exercises with pt holding Right wrist with left hand for AA flexion in supine, supine stargazer, scapular retraction, wall slides. Showed wrist extension with elbow ext and gentle shoulder ext to stretch cords. Advised to do with appropriate tightness only and not to force. Should not have pain in shoulder. To perform 2x's per day. Also educated in scar massage to incision area, and advised to watch ABC video.   ASSESSMENT:  CLINICAL IMPRESSION: Pt with some increased discomfort from ant bites on bil ankles so ice to ankles during MT per pt request. Limited AA/ROM per pt request due to discomfort of ankles. But she did have less reported discomfort with end AA/ROM stretching in Rt shoulder. Continued MLD to Rt breast, superior breast mildly softer  to palpation today and pt reports this area feeling less hard since last week. She is still very TTP over area of multiple cords in her Rt axilla.   Pt will benefit from skilled therapeutic intervention to improve on the following deficits: Decreased knowledge of precautions, impaired UE functional use, pain, decreased ROM, postural dysfunction.   PT treatment/interventions: ADL/Self care home management, [phone]- PT Re-evaluation, [phone]- Physical Performance Testing, 97110-Therapeutic exercises, 97530- Therapeutic activity, [id number]- Neuromuscular re-education, 97535- Self Care, [id number]- Manual therapy, 97760- Orthotic Initial, [phone]- Orthotic/Prosthetic subsequent, and Patient/Family education   GOALS: Goals reviewed with patient? Yes  LONG TERM GOALS:  (STG=LTG)  GOALS Name Target Date  Goal status  1 Pt will demonstrate she has regained full shoulder ROM and function post operatively compared to baselines.  Baseline: 02/24/2024 INITIAL  2 Pt will be independent in Right shoulder ROM and strengthening 02/24/2024 INITIAL  3 Pt will be independent in right breast MLD to decrease breast swelling 02/24/2024 INITIAL  4 Pt will be compliant with compression bra to decrease right breast swelling 02/24/2024 INITIAL     PLAN:  PT FREQUENCY/DURATION: 2x/week x 6 weeks  PLAN FOR NEXT SESSION: Review HEP and progress AA/A/ROM stretches, supine over half foam roll?, right shoulder strength, Cont and review MLD and cont MFR to cords, review scar massage   [facility]  [address]  [place] [address]  [phone]   Denyce Flank, PTA 01/19/2024, 9:05 AM

## 2024-01-20 ENCOUNTER — Ambulatory Visit
Admission: RE | Admit: 2024-01-20 | Discharge: 2024-01-20 | Disposition: A | Discharge status: Home / Self Care | Source: Ambulatory Visit | Attending: Radiation Oncology | Admitting: Radiation Oncology

## 2024-01-20 ENCOUNTER — Telehealth: Payer: Self-pay

## 2024-01-20 ENCOUNTER — Ambulatory Visit
Admission: RE | Admit: 2024-01-20 | Discharge: 2024-01-20 | Discharge status: Home / Self Care | Source: Ambulatory Visit | Attending: Radiation Oncology | Admitting: Radiation Oncology

## 2024-01-20 ENCOUNTER — Other Ambulatory Visit: Payer: Self-pay

## 2024-01-20 DIAGNOSIS — C50411 Malignant neoplasm of upper-outer quadrant of right female breast: Secondary | ICD-10-CM | POA: Diagnosis not present

## 2024-01-20 LAB — RAD ONC ARIA SESSION SUMMARY
Course Elapsed Days: 21
Plan Fractions Treated to Date: 8
Plan Prescribed Dose Per Fraction: 2.67 Gy
Plan Total Fractions Prescribed: 8
Plan Total Prescribed Dose: 21.36 Gy
Reference Point Dosage Given to Date: 40.05 Gy
Reference Point Session Dosage Given: 2.67 Gy
Session Number: 15

## 2024-01-20 NOTE — Telephone Encounter (Signed)
 Notified the pt to let her know that her forms were received in the office on today 01/20/2024. Pt stated that she dropped the forms off a month ago at the front desk, but we never received them. She also stated that this his second time dropping the forms off. The pt dropped the form in lab on 01/19/2024, but the office didn't get the form till 01/20/2024.  I let her know that the turn around time for the forms to be completed was 8-14 business days. Pt was upset and told me that we are [facility] we need to do better. I let her know I will get to the forms at least give me two to three days. No questions or concerns at this time.

## 2024-01-21 ENCOUNTER — Other Ambulatory Visit: Payer: Self-pay

## 2024-01-21 ENCOUNTER — Ambulatory Visit

## 2024-01-21 ENCOUNTER — Encounter: Payer: Self-pay | Admitting: Hematology and Oncology

## 2024-01-21 ENCOUNTER — Ambulatory Visit
Admission: RE | Admit: 2024-01-21 | Discharge: 2024-01-21 | Disposition: A | Discharge status: Home / Self Care | Source: Ambulatory Visit | Attending: Radiation Oncology | Admitting: Radiation Oncology

## 2024-01-21 DIAGNOSIS — N63 Unspecified lump in unspecified breast: Secondary | ICD-10-CM

## 2024-01-21 DIAGNOSIS — C50411 Malignant neoplasm of upper-outer quadrant of right female breast: Secondary | ICD-10-CM

## 2024-01-21 DIAGNOSIS — R293 Abnormal posture: Secondary | ICD-10-CM

## 2024-01-21 DIAGNOSIS — G8929 Other chronic pain: Secondary | ICD-10-CM

## 2024-01-21 DIAGNOSIS — I890 Lymphedema, not elsewhere classified: Secondary | ICD-10-CM

## 2024-01-21 LAB — RAD ONC ARIA SESSION SUMMARY
Course Elapsed Days: 22
Plan Fractions Treated to Date: 1
Plan Prescribed Dose Per Fraction: 2 Gy
Plan Total Fractions Prescribed: 5
Plan Total Prescribed Dose: 10 Gy
Reference Point Dosage Given to Date: 42.05 Gy
Reference Point Session Dosage Given: 2 Gy
Session Number: 16

## 2024-01-21 NOTE — Therapy (Signed)
 OUTPATIENT PHYSICAL THERAPY BREAST CANCER TREATMENT   Patient Name: [name] MRN: [id number] DOB:May 19, 1956, 68 y.o., female Today's Date: 01/21/2024  END OF SESSION:  PT End of Session - 01/21/24 0812     Visit Number 5    Number of Visits 14    Date for PT Re-Evaluation 02/24/24    PT Start Time 0813   late from radiation   PT Stop Time 0855    PT Time Calculation (min) 42 min    Activity Tolerance Patient tolerated treatment well    Behavior During Therapy [facility] for tasks assessed/performed;Agitated          Past Medical History:  Diagnosis Date   Arthritis    neck, knees, ankles, feet, back   Asthma, mild intermittent    prn inhaler   Breast cancer (HCC)    Cervical spondylosis    Chronic dyspnea    per pt gets winded w/ stairs, but recovers quickly   Diverticular disease of colon    followed by dr Tova Fresh (GI)---  chronic w/ chronic diarrhea   Essential hypertension    Fatty infiltration of liver    followed by dr Tova Fresh--- abd ultrasound in epic 08-31-2020   Frequency of urination    History of chest pain    evaluated by cardiologist-- dr Audery Blazing,  11/ 2017 nuclear stress test suggest ischemia,  06/2016 cardiac cath , showed normal coronary anatomy and lvf   History of COVID-19    per pt in 2019   treated with presumed covid had  mild to moderate symptoms that resolved   History of diverticulitis of colon    05/ 2022   History of TIA (transient ischemic attack) 06/2003   Hyperlipidemia    Hypertension    Limitation of joint motion of neck    per pt due to bone spurs C2-5   Mild intermittent asthma    followed by pcp   PMB (postmenopausal bleeding)    Pre-diabetes    Pulmonary nodule    followed by pcp,  last hest CT in epic 04-21-2018   Stenosis of cervix    SUI (stress urinary incontinence, female)    Thickened endometrium    TMJ syndrome    Wears glasses    Past Surgical History:  Procedure Laterality Date   APPENDECTOMY  1979   BREAST  ENHANCEMENT SURGERY Bilateral 1988   per pt implants removed 2002   BUNIONECTOMY Left 07/17/2007   @[facility]    BUNIONECTOMY Right 05/26/2014   Procedure: RIGHT FOOT LAPIDUS BUNION CORRECTION AMD MODIFIED MCBRIDE BUNIONECTOMY;  Surgeon: Amada Backer, MD;  Location: [facility];  Service: Orthopedics;  Laterality: Right;   CARDIAC CATHETERIZATION N/A 06/28/2016   Procedure: Left Heart Cath and Coronary Angiography;  Surgeon: Peter M Swaziland, MD;  Location: [facility] INVASIVE CV LAB;  Service: Cardiovascular;  Laterality: N/A;   CESAREAN SECTION  1986   COLONOSCOPY  12/27/2020   by dr Tova Fresh   FINGER SURGERY Left 07/28/2015   @[facility]  :  left thumb knuckle collasped  arthroplasty   FOOT HARDWARE REMOVAL  04/25/2016   @[facility] ;   right foot   FOOT SURGERY Right 04/04/2015   @[facility] ;   first and second toe's   GANGLION CYST EXCISION Right 2016   wrist   HYSTEROSCOPY WITH D & C N/A 05/24/2021   Procedure: DILATATION AND CURETTAGE /HYSTEROSCOPY and MyoSure;  Surgeon: Olin Bertin, DO;  Location: [facility];  Service: Gynecology;  Laterality: N/A;   NASAL  SEPTUM SURGERY  1981   RADIOACTIVE SEED GUIDED AXILLARY SENTINEL LYMPH NODE Right 11/27/2023   Procedure: RIGHT BREAST LUMPECTOMY AFTER MAGNETIC SEED LOCALIZATION AND SENTINEL LYMPH NODE BIOPSY;  Surgeon: Dareen Ebbing, MD;  Location: [facility];  Service: General;  Laterality: Right;  GEN w/PEC BLOCK RIGHT BREAST SEED LOCALIZED LUMPECTOMY WITH AXILLARY SENTINEL LYMPH NODE BIOPSY   ROTATOR CUFF REPAIR W/ DISTAL CLAVICLE EXCISION Left 08/18/2008   @[facility]  by dr Mevelyn Acton   SHOULDER ARTHROSCOPY W/ LABRAL REPAIR Left 06/07/2008   @[facility] ;  and [facility]AD  by dr Mevelyn Acton   TONSILLECTOMY  1964   TUBAL LIGATION Bilateral 1999   Patient Active Problem List   Diagnosis Date Noted   Genetic testing 11/26/2023   Malignant neoplasm of upper-outer quadrant of right breast in female, estrogen receptor positive (HCC) 11/17/2023   At  risk for Clostridium difficile infection 10/04/2022   Chronic renal disease, stage 2, mildly decreased glomerular filtration rate (GFR) between 60-89 mL/min/1.73 square meter 10/02/2022   Chronic fatigue 10/01/2022   Prediabetes 10/01/2022   Memory loss 07/05/2022   Hot flash, menopausal 10/23/2021   Cough variant asthma 06/19/2021   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/11/2021   Spondylolisthesis of lumbar region 04/29/2017   Spondylosis without myelopathy or radiculopathy, cervical region 04/29/2017   Pain from implanted hardware 04/04/2016   Morbid obesity (HCC) 03/19/2016   Primary osteoarthritis of first carpometacarpal joint of left hand 07/13/2015   History of TIA (transient ischemic attack) 03/14/2015   Metatarsalgia of right foot 03/08/2015   Routine general medical examination at a health care facility 11/23/2012   GERD 06/14/2009   Essential hypertension 06/16/2008   Hyperlipidemia 12/29/2007    PCP:   REFERRING PROVIDER: Colie Dawes, MD  REFERRING DIAG: s/p Right Breast Cancer  THERAPY DIAG:  Malignant neoplasm of upper-outer quadrant of right breast in female, estrogen receptor positive (HCC)  Abnormal posture  Chronic right shoulder pain  Swelling of breast  Lymphedema, not elsewhere classified  Rationale for Evaluation and Treatment: Rehabilitation  ONSET DATE: 01/06/2024 SUBJECTIVE:                                                                                                                                                                                           SUBJECTIVE STATEMENT: I see a lot of improvement in the swelling and its not as hard. Holding my arm up for radiation today was really painful because I was there longer. I'm stressed because I am late from radiation  PERTINENT HISTORY:  Patient was diagnosed on 10/24/2023 with right grade 2 invasive ductal carcinoma breast cancer. It measures 9 mm and  is located in the upper outer  quadrant. It is ER/PR positive and HER2 negative with a Ki67 of 20%. She had a Right lumpectomy with SLNB on 11/27/2023 with 0+/2 LN's. She developed a seroma in the axilla and lateral breast and had 160 c of fluid drained on 12/08/2023.  5 days later they drained and got 120 cc,She had another 10 cc drained on 12/22/2023. She is presently undergoing radiation.She has a history of endometrial cancer in 2022 and a history of a TIA   PATIENT GOALS:  Reassess how my recovery is going related to arm function, pain, and swelling.  PAIN:  Are you having pain?  PAIN:  Are you having pain? Yes NPRS scale: 5/10 Pain location: Right shoulder after radiation Pain orientation: Right  PAIN TYPE: aching Pain description: constant  Aggravating factors: radiation positioning Relieving factors: tylenol  No, just my ankles from the ant bites  PRECAUTIONS: Recent Surgery, right UE Lymphedema risk, Prior TIA  RED FLAGS: None   ACTIVITY LEVEL / LEISURE: walking   OBJECTIVE:   PATIENT SURVEYS:  QUICK DASH: 31.82  OBSERVATIONS: Large area of swelling with induration right upper breast  POSTURE:  Forward head and rounded shoulders posture    LYMPHEDEMA ASSESSMENT:   UPPER EXTREMITY AROM/PROM:   A/PROM RIGHT   eval   RIGHT 01/13/2024  Shoulder extension 57 45  Shoulder flexion 160 140  Shoulder abduction 169 115 tight under arm  Shoulder internal rotation 67 45  Shoulder external rotation 82 60                          (Blank rows = not tested)   A/PROM LEFT   eval  Shoulder extension 48  Shoulder flexion 137  Shoulder abduction 168  Shoulder internal rotation 57  Shoulder external rotation 75                          (Blank rows = not tested)   CERVICAL AROM: All within normal limits   UPPER EXTREMITY STRENGTH: WNL   LYMPHEDEMA ASSESSMENTS (in cm):    LANDMARK RIGHT   eval RIGHT 01/13/2024  10 cm proximal to olecranon process 32.9 33.8  Olecranon process 25 24.7  10 cm  proximal to ulnar styloid process 24.5 23.0  Just proximal to ulnar styloid process 16.9 17.4  Across hand at thumb web space 19.5 19.6  At base of 2nd digit 6.6 6.3  (Blank rows = not tested)   LANDMARK LEFT   eval LEFT 01/13/2024  10 cm proximal to olecranon process 35 34.6  Olecranon process 25.3 25.5  10 cm proximal to ulnar styloid process 23 22.7  Just proximal to ulnar styloid process 16.4 16.5  Across hand at thumb web space 18.1 18.7  At base of 2nd digit 6.5 6.4  (Blank rows = not tested)    Surgery type/Date: right breast lumpectomy and sentinel lymph node biopsy on 11/27/2023.  Number of lymph nodes removed: 0+/2 Current/past treatment (chemo, radiation, hormone therapy): radiation Other symptoms:  Heaviness/tightness No Pain Yes Pitting edema No Infections No Decreased scar mobility Yes Stemmer sign No   TODAY'S TREATMENT:  01/21/2024 Treatment decreased due to pt late from radiation boost MLD to Rt breast in supine: Short neck, superficial and deep abdominals, Lt axillary and pectoral nodes,  and anterior inter-axillary anastomosis, then Rt inguinal nodes, Rt axillo-inguinal anastomosis and then focused on Rt medial and superior breast,  then into Lt S/L for lateral and inferior breast redirecting towards lateral anastomosis then finished retracing all steps in supine P/ROM to Rt shoulder in supine during MLD into flex, abd and D2 to pts tolerance and scapular depression by therapist throughout    01/19/24: Manual Therapy MLD to Rt breast in supine: Short neck, superficial and deep abdominals, Lt axillary and pectoral nodes, anterior intact thorax sequence, and anterior inter-axillary anastomosis, then Rt inguinal nodes, Rt axillo-inguinal anastomosis and then focused on Rt medial and superior breast, then into Lt S/L for lateral and inferior breast redirecting towards lateral anastomosis then finished retracing all steps in supine P/ROM to Rt shoulder in supine  during MLD into flex, abd and D2 to pts tolerance and scapular depression by therapist throughout  MFR to multiple cords in Rt axilla and as pt could tolerate, she is very tender to touch in medial upper arm over cording Therapeutic Exercises Pulleys into flex x 2 mins, VC's to remind pt to decrease scap compensation Roll yellow ball up wall into flex x 7, pt with no shoulder discomfort with this today   01/15/24: Therapeutic Exercises Pulleys into flex and scaption x 2 mins each returning therapist demo and tactile and VC's throughout to decrease Rt scapular compensation Roll yellow ball up wall into flex x 7 and stopped due increasing Rt posterior shoulder discomfort Manual Therapy MLD to Rt breast in supine and began instructing pt in lymphatic system and basic principles of MLD while performing: Short neck, superficial and deep abdominals, Lt axillary and pectoral nodes, anterior intact thorax sequence, and anterior inter-axillary anastomosis, then Rt inguinal nodes, Rt axillo-inguinal anastomosis and then focused on Rt medial and superior breast, then into Lt S/L for lateral and inferior breast redirecting towards lateral anastomosis then finished retracing all steps in supine P/ROM to Rt shoulder in supine during MLD into flex, abd and D2 to pts tolerance and scapular depression by therapist throughout  MFR to multiple cords in Rt axilla and as pt could tolerate STM to Rt UT in supine and S/L Scap Mobs to Rt scapula in S/L into protraction and retraction  PATIENT EDUCATION:  Education details: POC, LOS, treatment interventions, 4 post op exercises, NTS for cords Person educated: Patient Education method: Programmer, multimedia, Demonstration, Verbal cues, and Handouts Education comprehension: verbalized understanding, returned demonstration, and verbal cues required  HOME EXERCISE PROGRAM: Instructed 4 post op exercises with pt holding Right wrist with left hand for AA flexion in supine, supine  stargazer, scapular retraction, wall slides. Showed wrist extension with elbow ext and gentle shoulder ext to stretch cords. Advised to do with appropriate tightness only and not to force. Should not have pain in shoulder. To perform 2x's per day. Also educated in scar massage to incision area, and advised to watch ABC video.   ASSESSMENT:  CLINICAL IMPRESSION: Pt required multiple VC's to relax for PROM but overall felt better and more relaxed at completion of rx. Decreased induration at upper breast after rx.   Pt will benefit from skilled therapeutic intervention to improve on the following deficits: Decreased knowledge of precautions, impaired UE functional use, pain, decreased ROM, postural dysfunction.   PT treatment/interventions: ADL/Self care home management, [phone]- PT Re-evaluation, [phone]- Physical Performance Testing, 97110-Therapeutic exercises, 97530- Therapeutic activity, [id number]- Neuromuscular re-education, 97535- Self Care, [id number]- Manual therapy, 97760- Orthotic Initial, [phone]- Orthotic/Prosthetic subsequent, and Patient/Family education   GOALS: Goals reviewed with patient? Yes  LONG TERM GOALS:  (STG=LTG)  GOALS Name Target Date  Goal  status  1 Pt will demonstrate she has regained full shoulder ROM and function post operatively compared to baselines.  Baseline: 02/24/2024 INITIAL  2 Pt will be independent in Right shoulder ROM and strengthening 02/24/2024 INITIAL  3 Pt will be independent in right breast MLD to decrease breast swelling 02/24/2024 INITIAL  4 Pt will be compliant with compression bra to decrease right breast swelling 02/24/2024 INITIAL     PLAN:  PT FREQUENCY/DURATION: 2x/week x 6 weeks  PLAN FOR NEXT SESSION: Review HEP and progress AA/A/ROM stretches, supine over half foam roll?, right shoulder strength, Cont and review MLD and cont MFR to cords, review scar massage   [facility]  [address], [address]  [place]  [address]  [phone]   Latisha Poland, PT 01/21/2024, 8:56 AM

## 2024-01-22 ENCOUNTER — Other Ambulatory Visit: Payer: Self-pay

## 2024-01-22 ENCOUNTER — Telehealth: Payer: Self-pay

## 2024-01-22 ENCOUNTER — Ambulatory Visit
Admission: RE | Admit: 2024-01-22 | Discharge: 2024-01-22 | Disposition: A | Discharge status: Home / Self Care | Source: Ambulatory Visit | Attending: Radiation Oncology | Admitting: Radiation Oncology

## 2024-01-22 DIAGNOSIS — C50411 Malignant neoplasm of upper-outer quadrant of right female breast: Secondary | ICD-10-CM | POA: Diagnosis not present

## 2024-01-22 LAB — RAD ONC ARIA SESSION SUMMARY
Course Elapsed Days: 23
Plan Fractions Treated to Date: 2
Plan Prescribed Dose Per Fraction: 2 Gy
Plan Total Fractions Prescribed: 5
Plan Total Prescribed Dose: 10 Gy
Reference Point Dosage Given to Date: 44.05 Gy
Reference Point Session Dosage Given: 2 Gy
Session Number: 17

## 2024-01-22 NOTE — Telephone Encounter (Signed)
 LVM for pt regarding her FMLA forms being completed,faxed and confirmation received. No questions or concerns at this time to be noted.

## 2024-01-23 ENCOUNTER — Other Ambulatory Visit: Payer: Self-pay

## 2024-01-23 ENCOUNTER — Ambulatory Visit
Admission: RE | Admit: 2024-01-23 | Discharge: 2024-01-23 | Disposition: A | Discharge status: Home / Self Care | Source: Ambulatory Visit | Attending: Radiation Oncology | Admitting: Radiation Oncology

## 2024-01-23 DIAGNOSIS — C50411 Malignant neoplasm of upper-outer quadrant of right female breast: Secondary | ICD-10-CM | POA: Diagnosis not present

## 2024-01-23 LAB — RAD ONC ARIA SESSION SUMMARY
Course Elapsed Days: 24
Plan Fractions Treated to Date: 3
Plan Prescribed Dose Per Fraction: 2 Gy
Plan Total Fractions Prescribed: 5
Plan Total Prescribed Dose: 10 Gy
Reference Point Dosage Given to Date: 46.05 Gy
Reference Point Session Dosage Given: 2 Gy
Session Number: 18

## 2024-01-26 ENCOUNTER — Ambulatory Visit
Admission: RE | Admit: 2024-01-26 | Discharge: 2024-01-26 | Disposition: A | Discharge status: Home / Self Care | Source: Ambulatory Visit | Attending: Radiation Oncology | Admitting: Radiation Oncology

## 2024-01-26 ENCOUNTER — Ambulatory Visit

## 2024-01-26 ENCOUNTER — Telehealth: Payer: Self-pay

## 2024-01-26 ENCOUNTER — Other Ambulatory Visit: Payer: Self-pay

## 2024-01-26 DIAGNOSIS — C50411 Malignant neoplasm of upper-outer quadrant of right female breast: Secondary | ICD-10-CM | POA: Diagnosis not present

## 2024-01-26 LAB — RAD ONC ARIA SESSION SUMMARY
Course Elapsed Days: 27
Plan Fractions Treated to Date: 4
Plan Prescribed Dose Per Fraction: 2 Gy
Plan Total Fractions Prescribed: 5
Plan Total Prescribed Dose: 10 Gy
Reference Point Dosage Given to Date: 48.05 Gy
Reference Point Session Dosage Given: 2 Gy
Session Number: 19

## 2024-01-26 NOTE — Telephone Encounter (Signed)
 Did a f/u phone call with [name] regarding her FMLA forms being completed,faxed, and confirmation received. Pt verbalized understanding and stated that she will pick up her hard copy. No questions or concerns to note at this time.

## 2024-01-26 NOTE — Telephone Encounter (Signed)
 Called to confirm appt.

## 2024-01-27 ENCOUNTER — Ambulatory Visit
Admission: RE | Admit: 2024-01-27 | Discharge: 2024-01-27 | Disposition: A | Discharge status: Home / Self Care | Source: Ambulatory Visit | Attending: Radiation Oncology | Admitting: Radiation Oncology

## 2024-01-27 ENCOUNTER — Ambulatory Visit

## 2024-01-27 ENCOUNTER — Other Ambulatory Visit: Payer: Self-pay

## 2024-01-27 DIAGNOSIS — C50411 Malignant neoplasm of upper-outer quadrant of right female breast: Secondary | ICD-10-CM | POA: Diagnosis not present

## 2024-01-27 LAB — RAD ONC ARIA SESSION SUMMARY
Course Elapsed Days: 28
Plan Fractions Treated to Date: 5
Plan Prescribed Dose Per Fraction: 2 Gy
Plan Total Fractions Prescribed: 5
Plan Total Prescribed Dose: 10 Gy
Reference Point Dosage Given to Date: 50.05 Gy
Reference Point Session Dosage Given: 2 Gy
Session Number: 20

## 2024-01-28 ENCOUNTER — Ambulatory Visit

## 2024-01-28 ENCOUNTER — Encounter

## 2024-01-28 NOTE — Radiation Completion Notes (Addendum)
  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, ER+ / PR+ / Her2-  Intent: Curative     ==========DELIVERED PLANS==========  First Treatment Date: 2023-12-30 Last Treatment Date: 2024-01-27   Plan Name: Breast_R Site: Breast, Right Technique: 3D Mode: Photon Dose Per Fraction: 2.67 Gy Prescribed Dose (Delivered / Prescribed): 18.69 Gy / 18.69 Gy Prescribed Fxs (Delivered / Prescribed): 7 / 7   Plan Name: Breast_R:1 Site: Breast, Right Technique: 3D Mode: Photon Dose Per Fraction: 2.67 Gy Prescribed Dose (Delivered / Prescribed): 21.36 Gy / 21.36 Gy Prescribed Fxs (Delivered / Prescribed): 8 / 8   Plan Name: Breast_R_Bst Site: Breast, Right Technique: 3D Mode: Photon Dose Per Fraction: 2 Gy Prescribed Dose (Delivered / Prescribed): 10 Gy / 10 Gy Prescribed Fxs (Delivered / Prescribed): 5 / 5     ====================================   The patient tolerated radiation. She developed fatigue, breast swelling, and anticipated skin changes in the treatment field.   The patient will return in one month and will continue follow up with Dr. Loretha as well.      Ronita Due, PA-C

## 2024-02-02 ENCOUNTER — Ambulatory Visit

## 2024-02-02 DIAGNOSIS — N63 Unspecified lump in unspecified breast: Secondary | ICD-10-CM

## 2024-02-02 DIAGNOSIS — Z170 Estrogen receptor positive status [ER+]: Secondary | ICD-10-CM

## 2024-02-02 DIAGNOSIS — C50411 Malignant neoplasm of upper-outer quadrant of right female breast: Secondary | ICD-10-CM | POA: Diagnosis not present

## 2024-02-02 DIAGNOSIS — I890 Lymphedema, not elsewhere classified: Secondary | ICD-10-CM

## 2024-02-02 DIAGNOSIS — R293 Abnormal posture: Secondary | ICD-10-CM

## 2024-02-02 DIAGNOSIS — G8929 Other chronic pain: Secondary | ICD-10-CM

## 2024-02-02 NOTE — Therapy (Signed)
 OUTPATIENT PHYSICAL THERAPY BREAST CANCER TREATMENT   Patient Name: [name] MRN: [id number] DOB:21-Dec-1955, 68 y.o., female Today's Date: 02/02/2024  END OF SESSION:  PT End of Session - 02/02/24 0757     Visit Number 6    Number of Visits 14    Date for PT Re-Evaluation 02/24/24    PT Start Time 0800    PT Stop Time 0846    PT Time Calculation (min) 46 min    Activity Tolerance Patient tolerated treatment well    Behavior During Therapy [facility] for tasks assessed/performed;Agitated          Past Medical History:  Diagnosis Date   Arthritis    neck, knees, ankles, feet, back   Asthma, mild intermittent    prn inhaler   Breast cancer (HCC)    Cervical spondylosis    Chronic dyspnea    per pt gets winded w/ stairs, but recovers quickly   Diverticular disease of colon    followed by dr kristie (GI)---  chronic w/ chronic diarrhea   Essential hypertension    Fatty infiltration of liver    followed by dr kristie--- abd ultrasound in epic 08-31-2020   Frequency of urination    History of chest pain    evaluated by cardiologist-- dr pietro,  11/ 2017 nuclear stress test suggest ischemia,  06/2016 cardiac cath , showed normal coronary anatomy and lvf   History of COVID-19    per pt in 2019   treated with presumed covid had  mild to moderate symptoms that resolved   History of diverticulitis of colon    05/ 2022   History of TIA (transient ischemic attack) 06/2003   Hyperlipidemia    Hypertension    Limitation of joint motion of neck    per pt due to bone spurs C2-5   Mild intermittent asthma    followed by pcp   PMB (postmenopausal bleeding)    Pre-diabetes    Pulmonary nodule    followed by pcp,  last hest CT in epic 04-21-2018   Stenosis of cervix    SUI (stress urinary incontinence, female)    Thickened endometrium    TMJ syndrome    Wears glasses    Past Surgical History:  Procedure Laterality Date   APPENDECTOMY  1979   BREAST ENHANCEMENT SURGERY  Bilateral 1988   per pt implants removed 2002   BUNIONECTOMY Left 07/17/2007   @[facility]    BUNIONECTOMY Right 05/26/2014   Procedure: RIGHT FOOT LAPIDUS BUNION CORRECTION AMD MODIFIED MCBRIDE BUNIONECTOMY;  Surgeon: Norleen Armor, MD;  Location: [facility];  Service: Orthopedics;  Laterality: Right;   CARDIAC CATHETERIZATION N/A 06/28/2016   Procedure: Left Heart Cath and Coronary Angiography;  Surgeon: Peter M Swaziland, MD;  Location: [facility] INVASIVE CV LAB;  Service: Cardiovascular;  Laterality: N/A;   CESAREAN SECTION  1986   COLONOSCOPY  12/27/2020   by dr kristie   FINGER SURGERY Left 07/28/2015   @[facility]  :  left thumb knuckle collasped  arthroplasty   FOOT HARDWARE REMOVAL  04/25/2016   @[facility] ;   right foot   FOOT SURGERY Right 04/04/2015   @[facility] ;   first and second toe's   GANGLION CYST EXCISION Right 2016   wrist   HYSTEROSCOPY WITH D & C N/A 05/24/2021   Procedure: DILATATION AND CURETTAGE /HYSTEROSCOPY and MyoSure;  Surgeon: Rendell Calton LABOR, DO;  Location: [facility];  Service: Gynecology;  Laterality: N/A;   NASAL SEPTUM SURGERY  1981  RADIOACTIVE SEED GUIDED AXILLARY SENTINEL LYMPH NODE Right 11/27/2023   Procedure: RIGHT BREAST LUMPECTOMY AFTER MAGNETIC SEED LOCALIZATION AND SENTINEL LYMPH NODE BIOPSY;  Surgeon: Belinda Cough, MD;  Location: [facility];  Service: General;  Laterality: Right;  GEN w/PEC BLOCK RIGHT BREAST SEED LOCALIZED LUMPECTOMY WITH AXILLARY SENTINEL LYMPH NODE BIOPSY   ROTATOR CUFF REPAIR W/ DISTAL CLAVICLE EXCISION Left 08/18/2008   @[facility]  by dr amy   SHOULDER ARTHROSCOPY W/ LABRAL REPAIR Left 06/07/2008   @[facility] ;  and SAD  by dr amy   TONSILLECTOMY  1964   TUBAL LIGATION Bilateral 1999   Patient Active Problem List   Diagnosis Date Noted   Genetic testing 11/26/2023   Malignant neoplasm of upper-outer quadrant of right breast in female, estrogen receptor positive (HCC) 11/17/2023   At risk for Clostridium  difficile infection 10/04/2022   Chronic renal disease, stage 2, mildly decreased glomerular filtration rate (GFR) between 60-89 mL/min/1.73 square meter 10/02/2022   Chronic fatigue 10/01/2022   Prediabetes 10/01/2022   Memory loss 07/05/2022   Hot flash, menopausal 10/23/2021   Cough variant asthma 06/19/2021   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/11/2021   Spondylolisthesis of lumbar region 04/29/2017   Spondylosis without myelopathy or radiculopathy, cervical region 04/29/2017   Pain from implanted hardware 04/04/2016   Morbid obesity (HCC) 03/19/2016   Primary osteoarthritis of first carpometacarpal joint of left hand 07/13/2015   History of TIA (transient ischemic attack) 03/14/2015   Metatarsalgia of right foot 03/08/2015   Routine general medical examination at a health care facility 11/23/2012   GERD 06/14/2009   Essential hypertension 06/16/2008   Hyperlipidemia 12/29/2007    PCP:   REFERRING PROVIDER: Lauraine Golden, MD  REFERRING DIAG: s/p Right Breast Cancer  THERAPY DIAG:  Malignant neoplasm of upper-outer quadrant of right breast in female, estrogen receptor positive (HCC)  Abnormal posture  Chronic right shoulder pain  Swelling of breast  Lymphedema, not elsewhere classified  Rationale for Evaluation and Treatment: Rehabilitation  ONSET DATE: 01/06/2024 SUBJECTIVE:                                                                                                                                                                                           SUBJECTIVE STATEMENT: I am in a lot of pain at the axilla from the radiation. The burning sensation is so bad. I can't wear the compression bra. I have been doing nothing at all. No reaching, no nothing.  PERTINENT HISTORY:  Patient was diagnosed on 10/24/2023 with right grade 2 invasive ductal carcinoma breast cancer. It measures 9 mm and is located in the upper outer quadrant.  It is ER/PR positive and  HER2 negative with a Ki67 of 20%. She had a Right lumpectomy with SLNB on 11/27/2023 with 0+/2 LN's. She developed a seroma in the axilla and lateral breast and had 160 c of fluid drained on 12/08/2023.  5 days later they drained and got 120 cc,She had another 10 cc drained on 12/22/2023. She is presently undergoing radiation.She has a history of endometrial cancer in 2022 and a history of a TIA   PATIENT GOALS:  Reassess how my recovery is going related to arm function, pain, and swelling.  PAIN:  Are you having pain?  PAIN:  Are you having pain? Yes NPRS scale: 2/10 present,6/10 worst. Pain location: Right shoulder after radiation Pain orientation: Right  PAIN TYPE: aching, burning Pain description: constant  Aggravating factors: radiation positioning Relieving factors: tylenol  No, just my ankles from the ant bites  PRECAUTIONS: Recent Surgery, right UE Lymphedema risk, Prior TIA  RED FLAGS: None   ACTIVITY LEVEL / LEISURE: walking   OBJECTIVE:   PATIENT SURVEYS:  QUICK DASH: 31.82  OBSERVATIONS: Large area of swelling with induration right upper breast  POSTURE:  Forward head and rounded shoulders posture    LYMPHEDEMA ASSESSMENT:   UPPER EXTREMITY AROM/PROM:   A/PROM RIGHT   eval   RIGHT 01/13/2024  Shoulder extension 57 45  Shoulder flexion 160 140  Shoulder abduction 169 115 tight under arm  Shoulder internal rotation 67 45  Shoulder external rotation 82 60                          (Blank rows = not tested)   A/PROM LEFT   eval  Shoulder extension 48  Shoulder flexion 137  Shoulder abduction 168  Shoulder internal rotation 57  Shoulder external rotation 75                          (Blank rows = not tested)   CERVICAL AROM: All within normal limits   UPPER EXTREMITY STRENGTH: WNL   LYMPHEDEMA ASSESSMENTS (in cm):    LANDMARK RIGHT   eval RIGHT 01/13/2024  10 cm proximal to olecranon process 32.9 33.8  Olecranon process 25 24.7  10 cm proximal  to ulnar styloid process 24.5 23.0  Just proximal to ulnar styloid process 16.9 17.4  Across hand at thumb web space 19.5 19.6  At base of 2nd digit 6.6 6.3  (Blank rows = not tested)   LANDMARK LEFT   eval LEFT 01/13/2024  10 cm proximal to olecranon process 35 34.6  Olecranon process 25.3 25.5  10 cm proximal to ulnar styloid process 23 22.7  Just proximal to ulnar styloid process 16.4 16.5  Across hand at thumb web space 18.1 18.7  At base of 2nd digit 6.5 6.4  (Blank rows = not tested)    Surgery type/Date: right breast lumpectomy and sentinel lymph node biopsy on 11/27/2023.  Number of lymph nodes removed: 0+/2 Current/past treatment (chemo, radiation, hormone therapy): radiation Other symptoms:  Heaviness/tightness No Pain Yes Pitting edema No Infections No Decreased scar mobility Yes Stemmer sign No   TODAY'S TREATMENT:  02/02/2024  MLD to Rt breast in supine: Short neck, superficial and deep abdominals, Lt axillary and pectoral nodes,  and anterior inter-axillary anastomosis, then Rt inguinal nodes, Rt axillo-inguinal anastomosis and then focused on Rt medial and superior breast, then into Lt S/L for lateral and inferior breast redirecting towards lateral  anastomosis then finished retracing all steps in supine P/ROM to Rt shoulder in supine during MLD into flex, abd and D2 , IR and ER to pts tolerance and scapular depression by therapist throughout     01/21/2024 Treatment decreased due to pt late from radiation boost MLD to Rt breast in supine: Short neck, superficial and deep abdominals, Lt axillary and pectoral nodes,  and anterior inter-axillary anastomosis, then Rt inguinal nodes, Rt axillo-inguinal anastomosis and then focused on Rt medial and superior breast, then into Lt S/L for lateral and inferior breast redirecting towards lateral anastomosis then finished retracing all steps in supine P/ROM to Rt shoulder in supine during MLD into flex, abd and D2 to pts  tolerance and scapular depression by therapist throughout    01/19/24: Manual Therapy MLD to Rt breast in supine: Short neck, superficial and deep abdominals, Lt axillary and pectoral nodes, anterior intact thorax sequence, and anterior inter-axillary anastomosis, then Rt inguinal nodes, Rt axillo-inguinal anastomosis and then focused on Rt medial and superior breast, then into Lt S/L for lateral and inferior breast redirecting towards lateral anastomosis then finished retracing all steps in supine P/ROM to Rt shoulder in supine during MLD into flex, abd and D2 to pts tolerance and scapular depression by therapist throughout  MFR to multiple cords in Rt axilla and as pt could tolerate, she is very tender to touch in medial upper arm over cording Therapeutic Exercises Pulleys into flex x 2 mins, VC's to remind pt to decrease scap compensation Roll yellow ball up wall into flex x 7, pt with no shoulder discomfort with this today   01/15/24: Therapeutic Exercises Pulleys into flex and scaption x 2 mins each returning therapist demo and tactile and VC's throughout to decrease Rt scapular compensation Roll yellow ball up wall into flex x 7 and stopped due increasing Rt posterior shoulder discomfort Manual Therapy MLD to Rt breast in supine and began instructing pt in lymphatic system and basic principles of MLD while performing: Short neck, superficial and deep abdominals, Lt axillary and pectoral nodes, anterior intact thorax sequence, and anterior inter-axillary anastomosis, then Rt inguinal nodes, Rt axillo-inguinal anastomosis and then focused on Rt medial and superior breast, then into Lt S/L for lateral and inferior breast redirecting towards lateral anastomosis then finished retracing all steps in supine P/ROM to Rt shoulder in supine during MLD into flex, abd and D2 to pts tolerance and scapular depression by therapist throughout  MFR to multiple cords in Rt axilla and as pt could tolerate STM  to Rt UT in supine and S/L Scap Mobs to Rt scapula in S/L into protraction and retraction  PATIENT EDUCATION:  Education details: POC, LOS, treatment interventions, 4 post op exercises, NTS for cords Person educated: Patient Education method: Programmer, multimedia, Demonstration, Verbal cues, and Handouts Education comprehension: verbalized understanding, returned demonstration, and verbal cues required  HOME EXERCISE PROGRAM: Instructed 4 post op exercises with pt holding Right wrist with left hand for AA flexion in supine, supine stargazer, scapular retraction, wall slides. Showed wrist extension with elbow ext and gentle shoulder ext to stretch cords. Advised to do with appropriate tightness only and not to force. Should not have pain in shoulder. To perform 2x's per day. Also educated in scar massage to incision area, and advised to watch ABC video.   ASSESSMENT:  CLINICAL IMPRESSION: Pts right axilla very red and uncomfortable from radiation boost. Pt unable to wear her compression bra, and has not been actively using her arm due to  pain. Performed MLD to right breast avoiding right axillary region, and PROM to right shoulder and pt tolerated each very well. She had excellent PROM without increased pain.She felt good after treatment  Pt will benefit from skilled therapeutic intervention to improve on the following deficits: Decreased knowledge of precautions, impaired UE functional use, pain, decreased ROM, postural dysfunction.   PT treatment/interventions: ADL/Self care home management, [phone]- PT Re-evaluation, [phone]- Physical Performance Testing, 97110-Therapeutic exercises, 97530- Therapeutic activity, [id number]- Neuromuscular re-education, 97535- Self Care, [id number]- Manual therapy, 97760- Orthotic Initial, [phone]- Orthotic/Prosthetic subsequent, and Patient/Family education   GOALS: Goals reviewed with patient? Yes  LONG TERM GOALS:  (STG=LTG)  GOALS Name Target Date  Goal status  1 Pt will  demonstrate she has regained full shoulder ROM and function post operatively compared to baselines.  Baseline: 02/24/2024 INITIAL  2 Pt will be independent in Right shoulder ROM and strengthening 02/24/2024 INITIAL  3 Pt will be independent in right breast MLD to decrease breast swelling 02/24/2024 INITIAL  4 Pt will be compliant with compression bra to decrease right breast swelling 02/24/2024 INITIAL     PLAN:  PT FREQUENCY/DURATION: 2x/week x 6 weeks  PLAN FOR NEXT SESSION: Check axillary skin, HEP and progress AA/A/ROM stretches, supine over half foam roll?, right shoulder strength, Cont and review MLD and cont MFR to cords, review scar massage   [facility]  [address], [address]  [place] [address]  [phone]   Grayce JINNY Sheldon, PT 02/02/2024, 8:47 AM

## 2024-02-03 ENCOUNTER — Encounter: Admitting: Internal Medicine

## 2024-02-04 ENCOUNTER — Ambulatory Visit

## 2024-02-09 ENCOUNTER — Ambulatory Visit: Attending: Radiation Oncology

## 2024-02-09 DIAGNOSIS — Z170 Estrogen receptor positive status [ER+]: Secondary | ICD-10-CM | POA: Diagnosis present

## 2024-02-09 DIAGNOSIS — I890 Lymphedema, not elsewhere classified: Secondary | ICD-10-CM | POA: Insufficient documentation

## 2024-02-09 DIAGNOSIS — M25511 Pain in right shoulder: Secondary | ICD-10-CM | POA: Insufficient documentation

## 2024-02-09 DIAGNOSIS — N63 Unspecified lump in unspecified breast: Secondary | ICD-10-CM | POA: Insufficient documentation

## 2024-02-09 DIAGNOSIS — C50411 Malignant neoplasm of upper-outer quadrant of right female breast: Secondary | ICD-10-CM | POA: Insufficient documentation

## 2024-02-09 DIAGNOSIS — G8929 Other chronic pain: Secondary | ICD-10-CM | POA: Diagnosis present

## 2024-02-09 DIAGNOSIS — R293 Abnormal posture: Secondary | ICD-10-CM | POA: Insufficient documentation

## 2024-02-09 NOTE — Therapy (Signed)
 OUTPATIENT PHYSICAL THERAPY BREAST CANCER TREATMENT   Patient Name: [name] MRN: [id number] DOB:07/13/1956, 69 y.o., female Today's Date: 02/09/2024  END OF SESSION:  PT End of Session - 02/09/24 1708     Visit Number 7    Number of Visits 14    Date for PT Re-Evaluation 02/24/24    PT Start Time 1600    PT Stop Time 1659    PT Time Calculation (min) 59 min    Activity Tolerance Patient tolerated treatment well    Behavior During Therapy WFL for tasks assessed/performed;Agitated          Past Medical History:  Diagnosis Date   Arthritis    neck, knees, ankles, feet, back   Asthma, mild intermittent    prn inhaler   Breast cancer (HCC)    Cervical spondylosis    Chronic dyspnea    per pt gets winded w/ stairs, but recovers quickly   Diverticular disease of colon    followed by dr kristie (GI)---  chronic w/ chronic diarrhea   Essential hypertension    Fatty infiltration of liver    followed by dr kristie--- abd ultrasound in epic 08-31-2020   Frequency of urination    History of chest pain    evaluated by cardiologist-- dr pietro,  11/ 2017 nuclear stress test suggest ischemia,  06/2016 cardiac cath , showed normal coronary anatomy and lvf   History of COVID-19    per pt in 2019   treated with presumed covid had  mild to moderate symptoms that resolved   History of diverticulitis of colon    05/ 2022   History of TIA (transient ischemic attack) 06/2003   Hyperlipidemia    Hypertension    Limitation of joint motion of neck    per pt due to bone spurs C2-5   Mild intermittent asthma    followed by pcp   PMB (postmenopausal bleeding)    Pre-diabetes    Pulmonary nodule    followed by pcp,  last hest CT in epic 04-21-2018   Stenosis of cervix    SUI (stress urinary incontinence, female)    Thickened endometrium    TMJ syndrome    Wears glasses    Past Surgical History:  Procedure Laterality Date   APPENDECTOMY  1979   BREAST ENHANCEMENT SURGERY Bilateral  1988   per pt implants removed 2002   BUNIONECTOMY Left 07/17/2007   @[facility]    BUNIONECTOMY Right 05/26/2014   Procedure: RIGHT FOOT LAPIDUS BUNION CORRECTION AMD MODIFIED MCBRIDE BUNIONECTOMY;  Surgeon: Norleen Armor, MD;  Location: [facility];  Service: Orthopedics;  Laterality: Right;   CARDIAC CATHETERIZATION N/A 06/28/2016   Procedure: Left Heart Cath and Coronary Angiography;  Surgeon: Peter M Swaziland, MD;  Location: [facility] INVASIVE CV LAB;  Service: Cardiovascular;  Laterality: N/A;   CESAREAN SECTION  1986   COLONOSCOPY  12/27/2020   by dr kristie   FINGER SURGERY Left 07/28/2015   @[facility]  :  left thumb knuckle collasped  arthroplasty   FOOT HARDWARE REMOVAL  04/25/2016   @[facility] ;   right foot   FOOT SURGERY Right 04/04/2015   @[facility] ;   first and second toe's   GANGLION CYST EXCISION Right 2016   wrist   HYSTEROSCOPY WITH D & C N/A 05/24/2021   Procedure: DILATATION AND CURETTAGE /HYSTEROSCOPY and MyoSure;  Surgeon: Rendell Calton LABOR, DO;  Location: [facility];  Service: Gynecology;  Laterality: N/A;   NASAL SEPTUM SURGERY  1981  RADIOACTIVE SEED GUIDED AXILLARY SENTINEL LYMPH NODE Right 11/27/2023   Procedure: RIGHT BREAST LUMPECTOMY AFTER MAGNETIC SEED LOCALIZATION AND SENTINEL LYMPH NODE BIOPSY;  Surgeon: Belinda Cough, MD;  Location: [facility];  Service: General;  Laterality: Right;  GEN w/PEC BLOCK RIGHT BREAST SEED LOCALIZED LUMPECTOMY WITH AXILLARY SENTINEL LYMPH NODE BIOPSY   ROTATOR CUFF REPAIR W/ DISTAL CLAVICLE EXCISION Left 08/18/2008   @[facility]  by dr amy   SHOULDER ARTHROSCOPY W/ LABRAL REPAIR Left 06/07/2008   @[facility] ;  and SAD  by dr amy   TONSILLECTOMY  1964   TUBAL LIGATION Bilateral 1999   Patient Active Problem List   Diagnosis Date Noted   Genetic testing 11/26/2023   Malignant neoplasm of upper-outer quadrant of right breast in female, estrogen receptor positive (HCC) 11/17/2023   At risk for Clostridium difficile  infection 10/04/2022   Chronic renal disease, stage 2, mildly decreased glomerular filtration rate (GFR) between 60-89 mL/min/1.73 square meter 10/02/2022   Chronic fatigue 10/01/2022   Prediabetes 10/01/2022   Memory loss 07/05/2022   Hot flash, menopausal 10/23/2021   Cough variant asthma 06/19/2021   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/11/2021   Spondylolisthesis of lumbar region 04/29/2017   Spondylosis without myelopathy or radiculopathy, cervical region 04/29/2017   Pain from implanted hardware 04/04/2016   Morbid obesity (HCC) 03/19/2016   Primary osteoarthritis of first carpometacarpal joint of left hand 07/13/2015   History of TIA (transient ischemic attack) 03/14/2015   Metatarsalgia of right foot 03/08/2015   Routine general medical examination at a health care facility 11/23/2012   GERD 06/14/2009   Essential hypertension 06/16/2008   Hyperlipidemia 12/29/2007    PCP:   REFERRING PROVIDER: Lauraine Golden, MD  REFERRING DIAG: s/p Right Breast Cancer  THERAPY DIAG:  Malignant neoplasm of upper-outer quadrant of right breast in female, estrogen receptor positive (HCC)  Abnormal posture  Chronic right shoulder pain  Swelling of breast  Lymphedema, not elsewhere classified  Rationale for Evaluation and Treatment: Rehabilitation  ONSET DATE: 01/06/2024 SUBJECTIVE:                                                                                                                                                                                           SUBJECTIVE STATEMENT: Skin is doing better. I can't let the air conditioning touch it and then it feels like neuropathy and it tingles and burns. I am able to wear a regular bra, but not the compression bra because it is so sore under my arm.  PERTINENT HISTORY:  Patient was diagnosed on 10/24/2023 with right grade 2 invasive ductal carcinoma breast cancer. It measures 9 mm  and is located in the upper outer  quadrant. It is ER/PR positive and HER2 negative with a Ki67 of 20%. She had a Right lumpectomy with SLNB on 11/27/2023 with 0+/2 LN's. She developed a seroma in the axilla and lateral breast and had 160 c of fluid drained on 12/08/2023.  5 days later they drained and got 120 cc,She had another 10 cc drained on 12/22/2023. She is presently undergoing radiation.She has a history of endometrial cancer in 2022 and a history of a TIA   PATIENT GOALS:  Reassess how my recovery is going related to arm function, pain, and swelling.  PAIN:  Are you having pain?  PAIN:  Are you having pain? Yes NPRS scale: 2/10 present,6/10 worst. Pain location: Right axilla after radiation Pain orientation: Right  PAIN TYPE: aching, burning Pain description: constant  Aggravating factors: radiation positioning Relieving factors: tylenol    PRECAUTIONS: Recent Surgery, right UE Lymphedema risk, Prior TIA  RED FLAGS: None   ACTIVITY LEVEL / LEISURE: walking   OBJECTIVE:   PATIENT SURVEYS:  QUICK DASH: 31.82  OBSERVATIONS: Large area of swelling with induration right upper breast  POSTURE:  Forward head and rounded shoulders posture    LYMPHEDEMA ASSESSMENT:   UPPER EXTREMITY AROM/PROM:   A/PROM RIGHT   eval   RIGHT 01/13/2024  Shoulder extension 57 45  Shoulder flexion 160 140  Shoulder abduction 169 115 tight under arm  Shoulder internal rotation 67 45  Shoulder external rotation 82 60                          (Blank rows = not tested)   A/PROM LEFT   eval  Shoulder extension 48  Shoulder flexion 137  Shoulder abduction 168  Shoulder internal rotation 57  Shoulder external rotation 75                          (Blank rows = not tested)   CERVICAL AROM: All within normal limits   UPPER EXTREMITY STRENGTH: WNL   LYMPHEDEMA ASSESSMENTS (in cm):    LANDMARK RIGHT   eval RIGHT 01/13/2024  10 cm proximal to olecranon process 32.9 33.8  Olecranon process 25 24.7  10 cm proximal to  ulnar styloid process 24.5 23.0  Just proximal to ulnar styloid process 16.9 17.4  Across hand at thumb web space 19.5 19.6  At base of 2nd digit 6.6 6.3  (Blank rows = not tested)   LANDMARK LEFT   eval LEFT 01/13/2024  10 cm proximal to olecranon process 35 34.6  Olecranon process 25.3 25.5  10 cm proximal to ulnar styloid process 23 22.7  Just proximal to ulnar styloid process 16.4 16.5  Across hand at thumb web space 18.1 18.7  At base of 2nd digit 6.5 6.4  (Blank rows = not tested)    Surgery type/Date: right breast lumpectomy and sentinel lymph node biopsy on 11/27/2023.  Number of lymph nodes removed: 0+/2 Current/past treatment (chemo, radiation, hormone therapy): radiation Other symptoms:  Heaviness/tightness No Pain Yes Pitting edema No Infections No Decreased scar mobility Yes Stemmer sign No   TODAY'S TREATMENT:  02/09/2024 MFR to right axillary region, upper arm and forearm areas of cording PROM right shoulder flexion, scaption, IR and ER. MLD to Rt breast in supine: Short neck, 5 Breaths, Lt axillary  nodes,  and anterior inter-axillary anastomosis, then Rt inguinal nodes, Rt axillo-inguinal anastomosis and then focused on  Rt medial and superior breast, then in Supine and later into Lt S/L for lateral and inferior breast redirecting towards lateral anastomosis then finished retracing all steps in supine ending with LN's. Therapist demonstrated all steps first and with VC's and hand over hand cueing therapist had pt perform all steps. Gave pt illustrated and written instructions, and showed pt poster in my room demonstrating how superficial lymphatics are so the need for gentle stretch, without a lot of pressure.  02/02/2024  MLD to Rt breast in supine: Short neck, superficial and deep abdominals, Lt axillary and pectoral nodes,  and anterior inter-axillary anastomosis, then Rt inguinal nodes, Rt axillo-inguinal anastomosis and then focused on Rt medial and superior  breast, then into Lt S/L for lateral and inferior breast redirecting towards lateral anastomosis then finished retracing all steps in supine P/ROM to Rt shoulder in supine during MLD into flex, abd and D2 , IR and ER to pts tolerance and scapular depression by therapist throughout   01/21/2024 Treatment decreased due to pt late from radiation boost MLD to Rt breast in supine: Short neck, superficial and deep abdominals, Lt axillary and pectoral nodes,  and anterior inter-axillary anastomosis, then Rt inguinal nodes, Rt axillo-inguinal anastomosis and then focused on Rt medial and superior breast, then into Lt S/L for lateral and inferior breast redirecting towards lateral anastomosis then finished retracing all steps in supine P/ROM to Rt shoulder in supine during MLD into flex, abd and D2 to pts tolerance and scapular depression by therapist throughout    01/19/24: Manual Therapy MLD to Rt breast in supine: Short neck, superficial and deep abdominals, Lt axillary and pectoral nodes, anterior intact thorax sequence, and anterior inter-axillary anastomosis, then Rt inguinal nodes, Rt axillo-inguinal anastomosis and then focused on Rt medial and superior breast, then into Lt S/L for lateral and inferior breast redirecting towards lateral anastomosis then finished retracing all steps in supine P/ROM to Rt shoulder in supine during MLD into flex, abd and D2 to pts tolerance and scapular depression by therapist throughout  MFR to multiple cords in Rt axilla and as pt could tolerate, she is very tender to touch in medial upper arm over cording Therapeutic Exercises Pulleys into flex x 2 mins, VC's to remind pt to decrease scap compensation Roll yellow ball up wall into flex x 7, pt with no shoulder discomfort with this today   01/15/24: Therapeutic Exercises Pulleys into flex and scaption x 2 mins each returning therapist demo and tactile and VC's throughout to decrease Rt scapular compensation Roll  yellow ball up wall into flex x 7 and stopped due increasing Rt posterior shoulder discomfort Manual Therapy MLD to Rt breast in supine and began instructing pt in lymphatic system and basic principles of MLD while performing: Short neck, superficial and deep abdominals, Lt axillary and pectoral nodes, anterior intact thorax sequence, and anterior inter-axillary anastomosis, then Rt inguinal nodes, Rt axillo-inguinal anastomosis and then focused on Rt medial and superior breast, then into Lt S/L for lateral and inferior breast redirecting towards lateral anastomosis then finished retracing all steps in supine P/ROM to Rt shoulder in supine during MLD into flex, abd and D2 to pts tolerance and scapular depression by therapist throughout  MFR to multiple cords in Rt axilla and as pt could tolerate STM to Rt UT in supine and S/L Scap Mobs to Rt scapula in S/L into protraction and retraction  PATIENT EDUCATION:  Education details: POC, LOS, treatment interventions, 4 post op exercises, NTS for cords  Person educated: Patient Education method: Explanation, Demonstration, Verbal cues, and Handouts Education comprehension: verbalized understanding, returned demonstration, and verbal cues required  HOME EXERCISE PROGRAM: Instructed 4 post op exercises with pt holding Right wrist with left hand for AA flexion in supine, supine stargazer, scapular retraction, wall slides. Showed wrist extension with elbow ext and gentle shoulder ext to stretch cords. Advised to do with appropriate tightness only and not to force. Should not have pain in shoulder. To perform 2x's per day. Also educated in scar massage to incision area, and advised to watch ABC video.   ASSESSMENT:  CLINICAL IMPRESSION: Pts right axillary region improving with darkened skin, but no redness and is starting to peel. She had good tolerance for MFR techniques to cording, with cording still evident in axillary region. She required VC's to reduce  pressure with her MLD techniques but had a good understanding of sequence and direction of stretch. She did well with axillo-inguinal pathway using left hand in SL. She does need review. Handout was given for pt to review at home.   Pt will benefit from skilled therapeutic intervention to improve on the following deficits: Decreased knowledge of precautions, impaired UE functional use, pain, decreased ROM, postural dysfunction.   PT treatment/interventions: ADL/Self care home management, [phone]- PT Re-evaluation, [phone]- Physical Performance Testing, 97110-Therapeutic exercises, 97530- Therapeutic activity, [id number]- Neuromuscular re-education, 97535- Self Care, [id number]- Manual therapy, 97760- Orthotic Initial, [phone]- Orthotic/Prosthetic subsequent, and Patient/Family education   GOALS: Goals reviewed with patient? Yes  LONG TERM GOALS:  (STG=LTG)  GOALS Name Target Date  Goal status  1 Pt will demonstrate she has regained full shoulder ROM and function post operatively compared to baselines.  Baseline: 02/24/2024 INITIAL  2 Pt will be independent in Right shoulder ROM and strengthening 02/24/2024 INITIAL  3 Pt will be independent in right breast MLD to decrease breast swelling 02/24/2024 INITIAL  4 Pt will be compliant with compression bra to decrease right breast swelling 02/24/2024 INITIAL     PLAN:  PT FREQUENCY/DURATION: 2x/week x 6 weeks  PLAN FOR NEXT SESSION: Check axillary skin, pulleys, Review self MLD,HEP and progress AA/A/ROM stretches, supine over half foam roll?, right shoulder strength, Cont and review MLD and cont MFR to cords, review scar massage   [facility]  [address]  [place] [address]  [phone]   Grayce JINNY Sheldon, PT 02/09/2024, 5:09 PM

## 2024-02-11 ENCOUNTER — Ambulatory Visit

## 2024-02-11 DIAGNOSIS — N63 Unspecified lump in unspecified breast: Secondary | ICD-10-CM

## 2024-02-11 DIAGNOSIS — R293 Abnormal posture: Secondary | ICD-10-CM

## 2024-02-11 DIAGNOSIS — G8929 Other chronic pain: Secondary | ICD-10-CM

## 2024-02-11 DIAGNOSIS — I890 Lymphedema, not elsewhere classified: Secondary | ICD-10-CM

## 2024-02-11 DIAGNOSIS — Z170 Estrogen receptor positive status [ER+]: Secondary | ICD-10-CM

## 2024-02-11 DIAGNOSIS — C50411 Malignant neoplasm of upper-outer quadrant of right female breast: Secondary | ICD-10-CM | POA: Diagnosis not present

## 2024-02-11 NOTE — Therapy (Signed)
 OUTPATIENT PHYSICAL THERAPY BREAST CANCER TREATMENT   Patient Name: Evalena Fujii MRN: [id number] DOB:05-01-1956, 68 y.o., female Today's Date: 02/11/2024  END OF SESSION:  PT End of Session - 02/11/24 0856     Visit Number 8    Number of Visits 14    Date for PT Re-Evaluation 02/24/24    PT Start Time 0803    PT Stop Time 0850   pt had to leave early for another MD appt   PT Time Calculation (min) 47 min    Activity Tolerance Patient tolerated treatment well    Behavior During Therapy [facility] for tasks assessed/performed          Past Medical History:  Diagnosis Date   Arthritis    neck, knees, ankles, feet, back   Asthma, mild intermittent    prn inhaler   Breast cancer (HCC)    Cervical spondylosis    Chronic dyspnea    per pt gets winded w/ stairs, but recovers quickly   Diverticular disease of colon    followed by dr kristie (GI)---  chronic w/ chronic diarrhea   Essential hypertension    Fatty infiltration of liver    followed by dr kristie--- abd ultrasound in epic 08-31-2020   Frequency of urination    History of chest pain    evaluated by cardiologist-- dr pietro,  11/ 2017 nuclear stress test suggest ischemia,  06/2016 cardiac cath , showed normal coronary anatomy and lvf   History of COVID-19    per pt in 2019   treated with presumed covid had  mild to moderate symptoms that resolved   History of diverticulitis of colon    05/ 2022   History of TIA (transient ischemic attack) 06/2003   Hyperlipidemia    Hypertension    Limitation of joint motion of neck    per pt due to bone spurs C2-5   Mild intermittent asthma    followed by pcp   PMB (postmenopausal bleeding)    Pre-diabetes    Pulmonary nodule    followed by pcp,  last hest CT in epic 04-21-2018   Stenosis of cervix    SUI (stress urinary incontinence, female)    Thickened endometrium    TMJ syndrome    Wears glasses    Past Surgical History:  Procedure Laterality Date   APPENDECTOMY  1979    BREAST ENHANCEMENT SURGERY Bilateral 1988   per pt implants removed 2002   BUNIONECTOMY Left 07/17/2007   @[facility]    BUNIONECTOMY Right 05/26/2014   Procedure: RIGHT FOOT LAPIDUS BUNION CORRECTION AMD MODIFIED MCBRIDE BUNIONECTOMY;  Surgeon: Norleen Armor, MD;  Location: [facility];  Service: Orthopedics;  Laterality: Right;   CARDIAC CATHETERIZATION N/A 06/28/2016   Procedure: Left Heart Cath and Coronary Angiography;  Surgeon: Peter M Swaziland, MD;  Location: [facility] IN[facility]ASIVE CV [facility];  Service: Cardiovascular;  Laterality: N/A;   CESAREAN SECTION  1986   COLONOSCOPY  12/27/2020   by dr kristie   FINGER SURGERY Left 07/28/2015   @[facility]  :  left thumb knuckle collasped  arthroplasty   FOOT HARDWARE REMOVAL  04/25/2016   @[facility] ;   right foot   FOOT SURGERY Right 04/04/2015   @[facility] ;   first and second toe's   GANGLION CYST EXCISION Right 2016   wrist   HYSTEROSCOPY WITH D & C N/A 05/24/2021   Procedure: DILATATION AND CURETTAGE /HYSTEROSCOPY and MyoSure;  Surgeon: Rendell Calton LABOR, DO;  Location: [facility];  Service: Gynecology;  Laterality: N/A;   NASAL SEPTUM SURGERY  1981   RADIOACTIVE SEED GUIDED AXILLARY SENTINEL LYMPH NODE Right 11/27/2023   Procedure: RIGHT BREAST LUMPECTOMY AFTER MAGNETIC SEED LOCALIZATION AND SENTINEL LYMPH NODE BIOPSY;  Surgeon: Belinda Cough, MD;  Location: [facility];  Service: General;  Laterality: Right;  GEN w/PEC BLOCK RIGHT BREAST SEED LOCALIZED LUMPECTOMY WITH AXILLARY SENTINEL LYMPH NODE BIOPSY   ROTATOR CUFF REPAIR W/ DISTAL CLAVICLE EXCISION Left 08/18/2008   @[facility]  by dr amy   SHOULDER ARTHROSCOPY W/ LABRAL REPAIR Left 06/07/2008   @[facility] ;  and SAD  by dr amy   TONSILLECTOMY  1964   TUBAL LIGATION Bilateral 1999   Patient Active Problem List   Diagnosis Date Noted   Genetic testing 11/26/2023   Malignant neoplasm of upper-outer quadrant of right breast in female, estrogen receptor positive (HCC) 11/17/2023    At risk for Clostridium difficile infection 10/04/2022   Chronic renal disease, stage 2, mildly decreased glomerular filtration rate (GFR) between 60-89 mL/min/1.73 square meter 10/02/2022   Chronic fatigue 10/01/2022   Prediabetes 10/01/2022   Memory loss 07/05/2022   Hot flash, menopausal 10/23/2021   Cough variant asthma 06/19/2021   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/11/2021   Spondylolisthesis of lumbar region 04/29/2017   Spondylosis without myelopathy or radiculopathy, cervical region 04/29/2017   Pain from implanted hardware 04/04/2016   Morbid obesity (HCC) 03/19/2016   Primary osteoarthritis of first carpometacarpal joint of left hand 07/13/2015   History of TIA (transient ischemic attack) 03/14/2015   Metatarsalgia of right foot 03/08/2015   Routine general medical examination at a health care facility 11/23/2012   GERD 06/14/2009   Essential hypertension 06/16/2008   Hyperlipidemia 12/29/2007    PCP:   REFERRING PROVIDER: Lauraine Golden, MD  REFERRING DIAG: s/p Right Breast Cancer  THERAPY DIAG:  Malignant neoplasm of upper-outer quadrant of right breast in female, estrogen receptor positive (HCC)  Abnormal posture  Chronic right shoulder pain  Swelling of breast  Lymphedema, not elsewhere classified  Rationale for Evaluation and Treatment: Rehabilitation  ONSET DATE: 01/06/2024 SUBJECTIVE:                                                                                                                                                                                           SUBJECTIVE STATEMENT: My skin is feeling better this week. I can touch my breast and axilla again. The area of firmness is getting better as well. I've been working on the self MLD Grayce taught me and I can tell that helps to further soften the area at the top of my breast as well.  PERTINENT HISTORY:  Patient was diagnosed on 10/24/2023 with right grade 2 invasive ductal  carcinoma breast cancer. It measures 9 mm and is located in the upper outer quadrant. It is ER/PR positive and HER2 negative with a Ki67 of 20%. She had a Right lumpectomy with SLNB on 11/27/2023 with 0+/2 LN's. She developed a seroma in the axilla and lateral breast and had 160 c of fluid drained on 12/08/2023.  5 days later they drained and got 120 cc,She had another 10 cc drained on 12/22/2023. She is presently undergoing radiation.She has a history of endometrial cancer in 2022 and a history of a TIA   PATIENT GOALS:  Reassess how my recovery is going related to arm function, pain, and swelling.  PAIN:  Are you having pain? Not currently, just mildly tender to touch    PRECAUTIONS: Recent Surgery, right UE Lymphedema risk, Prior TIA  RED FLAGS: None   ACTIVITY LEVEL / LEISURE: walking   OBJECTIVE:   PATIENT SURVEYS:  QUICK DASH: 31.82  OBSERVATIONS: Large area of swelling with induration right upper breast  POSTURE:  Forward head and rounded shoulders posture    LYMPHEDEMA ASSESSMENT:   UPPER EXTREMITY AROM/PROM:   A/PROM RIGHT   eval   RIGHT 01/13/2024  Shoulder extension 57 45  Shoulder flexion 160 140  Shoulder abduction 169 115 tight under arm  Shoulder internal rotation 67 45  Shoulder external rotation 82 60                          (Blank rows = not tested)   A/PROM LEFT   eval  Shoulder extension 48  Shoulder flexion 137  Shoulder abduction 168  Shoulder internal rotation 57  Shoulder external rotation 75                          (Blank rows = not tested)   CERVICAL AROM: All within normal limits   UPPER EXTREMITY STRENGTH: WNL   LYMPHEDEMA ASSESSMENTS (in cm):    LANDMARK RIGHT   eval RIGHT 01/13/2024  10 cm proximal to olecranon process 32.9 33.8  Olecranon process 25 24.7  10 cm proximal to ulnar styloid process 24.5 23.0  Just proximal to ulnar styloid process 16.9 17.4  Across hand at thumb web space 19.5 19.6  At base of 2nd digit 6.6  6.3  (Blank rows = not tested)   LANDMARK LEFT   eval LEFT 01/13/2024  10 cm proximal to olecranon process 35 34.6  Olecranon process 25.3 25.5  10 cm proximal to ulnar styloid process 23 22.7  Just proximal to ulnar styloid process 16.4 16.5  Across hand at thumb web space 18.1 18.7  At base of 2nd digit 6.5 6.4  (Blank rows = not tested)    Surgery type/Date: right breast lumpectomy and sentinel lymph node biopsy on 11/27/2023.  Number of lymph nodes removed: 0+/2 Current/past treatment (chemo, radiation, hormone therapy): radiation Other symptoms:  Heaviness/tightness No Pain Yes Pitting edema No Infections No Decreased scar mobility Yes Stemmer sign No   TODAY'S TREATMENT: 02/11/24: Manual Therapy MLD to Rt breast in supine: Short neck, superficial and deep abdominals, Lt axillary and pectoral nodes, anterior intact thorax sequence and anterior inter-axillary anastomosis, then Rt inguinal nodes, Rt axillo-inguinal anastomosis and then focused on Rt medial and superior breast, then in Supine and later into Lt S/L for lateral and inferior breast redirecting towards lateral anastomosis.  Unable to finishing retracing steps in supine today as pt reported near end of session she had to leave for an MD appt.  MFR to right axillary region, upper arm and forearm areas of cording, pt still with mod-max tenderness along cording in axilla PROM right shoulder flexion, scaption, and D2 with scapular depression by therapist throughout   02/09/2024 MFR to right axillary region, upper arm and forearm areas of cording PROM right shoulder flexion, scaption, IR and ER. MLD to Rt breast in supine: Short neck, 5 Breaths, Lt axillary  nodes,  and anterior inter-axillary anastomosis, then Rt inguinal nodes, Rt axillo-inguinal anastomosis and then focused on Rt medial and superior breast, then in Supine and later into Lt S/L for lateral and inferior breast redirecting towards lateral anastomosis then  finished retracing all steps in supine ending with LN's. Therapist demonstrated all steps first and with VC's and hand over hand cueing therapist had pt perform all steps. Gave pt illustrated and written instructions, and showed pt poster in my room demonstrating how superficial lymphatics are so the need for gentle stretch, without a lot of pressure.  02/02/2024  MLD to Rt breast in supine: Short neck, superficial and deep abdominals, Lt axillary and pectoral nodes,  and anterior inter-axillary anastomosis, then Rt inguinal nodes, Rt axillo-inguinal anastomosis and then focused on Rt medial and superior breast, then into Lt S/L for lateral and inferior breast redirecting towards lateral anastomosis then finished retracing all steps in supine P/ROM to Rt shoulder in supine during MLD into flex, abd and D2 , IR and ER to pts tolerance and scapular depression by therapist throughout   01/21/2024 Treatment decreased due to pt late from radiation boost MLD to Rt breast in supine: Short neck, superficial and deep abdominals, Lt axillary and pectoral nodes,  and anterior inter-axillary anastomosis, then Rt inguinal nodes, Rt axillo-inguinal anastomosis and then focused on Rt medial and superior breast, then into Lt S/L for lateral and inferior breast redirecting towards lateral anastomosis then finished retracing all steps in supine P/ROM to Rt shoulder in supine during MLD into flex, abd and D2 to pts tolerance and scapular depression by therapist throughout    01/19/24: Manual Therapy MLD to Rt breast in supine: Short neck, superficial and deep abdominals, Lt axillary and pectoral nodes, anterior intact thorax sequence, and anterior inter-axillary anastomosis, then Rt inguinal nodes, Rt axillo-inguinal anastomosis and then focused on Rt medial and superior breast, then into Lt S/L for lateral and inferior breast redirecting towards lateral anastomosis then finished retracing all steps in supine P/ROM to Rt  shoulder in supine during MLD into flex, abd and D2 to pts tolerance and scapular depression by therapist throughout  MFR to multiple cords in Rt axilla and as pt could tolerate, she is very tender to touch in medial upper arm over cording Therapeutic Exercises Pulleys into flex x 2 mins, VC's to remind pt to decrease scap compensation Roll yellow ball up wall into flex x 7, pt with no shoulder discomfort with this today     PATIENT EDUCATION:  Education details: POC, LOS, treatment interventions, 4 post op exercises, NTS for cords Person educated: Patient Education method: Programmer, multimedia, Demonstration, Verbal cues, and Handouts Education comprehension: verbalized understanding, returned demonstration, and verbal cues required  HOME EXERCISE PROGRAM: Instructed 4 post op exercises with pt holding Right wrist with left hand for AA flexion in supine, supine stargazer, scapular retraction, wall slides. Showed wrist extension with elbow ext and gentle shoulder ext to stretch  cords. Advised to do with appropriate tightness only and not to force. Should not have pain in shoulder. To perform 2x's per day. Also educated in scar massage to incision area, and advised to watch ABC video.   ASSESSMENT:  CLINICAL IMPRESSION: Brief review of anastomosis today and then therapist continued with demonstration of sequence. Also continued with MFR to cording and P/ROM of Rt shoulder. She conts with mod-max tenderness at axillary cording but does tolerate stretching. She had to leave session early for an MD appt. Mindful of healing skin during session, she is peeling in axilla and at superior and inferior breast.    Pt will benefit from skilled therapeutic intervention to improve on the following deficits: Decreased knowledge of precautions, impaired UE functional use, pain, decreased ROM, postural dysfunction.   PT treatment/interventions: ADL/Self care home management, [phone]- PT Re-evaluation, [phone]- Physical  Performance Testing, 97110-Therapeutic exercises, 97530- Therapeutic activity, [id number]- Neuromuscular re-education, 97535- Self Care, [id number]- Manual therapy, 97760- Orthotic Initial, [phone]- Orthotic/Prosthetic subsequent, and Patient/Family education   GOALS: Goals reviewed with patient? Yes  LONG TERM GOALS:  (STG=LTG)  GOALS Name Target Date  Goal status  1 Pt will demonstrate she has regained full shoulder ROM and function post operatively compared to baselines.  Baseline: 02/24/2024 INITIAL  2 Pt will be independent in Right shoulder ROM and strengthening 02/24/2024 INITIAL  3 Pt will be independent in right breast MLD to decrease breast swelling 02/24/2024 INITIAL  4 Pt will be compliant with compression bra to decrease right breast swelling 02/24/2024 INITIAL     PLAN:  PT FREQUENCY/DURATION: 2x/week x 6 weeks  PLAN FOR NEXT SESSION: Check axillary skin, pulleys, Review self MLD,HEP and progress AA/A/ROM stretches, supine over half foam roll?, right shoulder strength, Cont and review MLD and cont MFR to cords, review scar massage   [facility]  [address]  [place] [address]  [phone]   Aden Berwyn Caldron, PTA 02/11/2024, 8:57 AM

## 2024-02-16 ENCOUNTER — Ambulatory Visit

## 2024-02-16 ENCOUNTER — Encounter: Payer: Self-pay | Admitting: Physician Assistant

## 2024-02-16 ENCOUNTER — Inpatient Hospital Stay: Attending: Hematology and Oncology | Admitting: Physician Assistant

## 2024-02-16 ENCOUNTER — Ambulatory Visit (HOSPITAL_BASED_OUTPATIENT_CLINIC_OR_DEPARTMENT_OTHER)
Admission: RE | Admit: 2024-02-16 | Discharge: 2024-02-16 | Disposition: A | Discharge status: Home / Self Care | Source: Ambulatory Visit | Attending: Physician Assistant | Admitting: Physician Assistant

## 2024-02-16 VITALS — BP 109/63 | HR 94 | Temp 98.4°F | Resp 16 | Wt 196.3 lb

## 2024-02-16 DIAGNOSIS — R509 Fever, unspecified: Secondary | ICD-10-CM | POA: Insufficient documentation

## 2024-02-16 DIAGNOSIS — Z170 Estrogen receptor positive status [ER+]: Secondary | ICD-10-CM | POA: Diagnosis not present

## 2024-02-16 DIAGNOSIS — C50411 Malignant neoplasm of upper-outer quadrant of right female breast: Secondary | ICD-10-CM | POA: Insufficient documentation

## 2024-02-16 DIAGNOSIS — N644 Mastodynia: Secondary | ICD-10-CM | POA: Insufficient documentation

## 2024-02-16 DIAGNOSIS — R293 Abnormal posture: Secondary | ICD-10-CM

## 2024-02-16 DIAGNOSIS — N63 Unspecified lump in unspecified breast: Secondary | ICD-10-CM

## 2024-02-16 DIAGNOSIS — G8929 Other chronic pain: Secondary | ICD-10-CM

## 2024-02-16 DIAGNOSIS — I890 Lymphedema, not elsewhere classified: Secondary | ICD-10-CM

## 2024-02-16 LAB — CBC WITH DIFFERENTIAL (CANCER CENTER ONLY)
Abs Immature Granulocytes: 0.04 K/uL (ref 0.00–0.07)
Basophils Absolute: 0.1 K/uL (ref 0.0–0.1)
Basophils Relative: 0 %
Eosinophils Absolute: 0.1 K/uL (ref 0.0–0.5)
Eosinophils Relative: 0 %
HCT: 40.4 % (ref 36.0–46.0)
Hemoglobin: 14 g/dL (ref 12.0–15.0)
Immature Granulocytes: 0 %
Lymphocytes Relative: 7 %
Lymphs Abs: 0.8 K/uL (ref 0.7–4.0)
MCH: 29.7 pg (ref 26.0–34.0)
MCHC: 34.7 g/dL (ref 30.0–36.0)
MCV: 85.8 fL (ref 80.0–100.0)
Monocytes Absolute: 1 K/uL (ref 0.1–1.0)
Monocytes Relative: 9 %
Neutro Abs: 9.8 K/uL — ABNORMAL HIGH (ref 1.7–7.7)
Neutrophils Relative %: 84 %
Platelet Count: 167 K/uL (ref 150–400)
RBC: 4.71 MIL/uL (ref 3.87–5.11)
RDW: 13.1 % (ref 11.5–15.5)
WBC Count: 11.8 K/uL — ABNORMAL HIGH (ref 4.0–10.5)
nRBC: 0 % (ref 0.0–0.2)

## 2024-02-16 LAB — CMP (CANCER CENTER ONLY)
ALT: 11 U/L (ref 0–44)
AST: 10 U/L — ABNORMAL LOW (ref 15–41)
Albumin: 4 g/dL (ref 3.5–5.0)
Alkaline Phosphatase: 70 U/L (ref 38–126)
Anion gap: 7 (ref 5–15)
BUN: 19 mg/dL (ref 8–23)
CO2: 29 mmol/L (ref 22–32)
Calcium: 9.7 mg/dL (ref 8.9–10.3)
Chloride: 100 mmol/L (ref 98–111)
Creatinine: 1.13 mg/dL — ABNORMAL HIGH (ref 0.44–1.00)
GFR, Estimated: 54 mL/min — ABNORMAL LOW (ref >60)
Glucose, Bld: 105 mg/dL — ABNORMAL HIGH (ref 70–99)
Potassium: 3.5 mmol/L (ref 3.5–5.1)
Sodium: 136 mmol/L (ref 135–145)
Total Bilirubin: 0.9 mg/dL (ref 0.0–1.2)
Total Protein: 7.2 g/dL (ref 6.5–8.1)

## 2024-02-16 MED ORDER — CEPHALEXIN 500 MG PO CAPS: ORAL_CAPSULE | ORAL | 0 refills | Status: AC

## 2024-02-16 MED ORDER — IOHEXOL 300 MG/ML  SOLN
80.0000 mL | Freq: Once | INTRAMUSCULAR | Status: AC | PRN
Start: 2024-02-16 — End: 2024-02-16
  Administered 2024-02-16: 80 mL via INTRAVENOUS

## 2024-02-16 MED ORDER — ONDANSETRON HCL 8 MG PO TABS: 8.0000 mg | ORAL_TABLET | Freq: Three times a day (TID) | ORAL | 0 refills | Status: DC | PRN

## 2024-02-16 NOTE — Progress Notes (Signed)
 Symptom Management Consult Note [facility]    Patient Care Team: Rollene Almarie LABOR, MD as PCP - General (Internal Medicine) Pietro Redell RAMAN, MD as PCP - Cardiology (Cardiology) Tyree Nanetta SAILOR, RN as Oncology Nurse Navigator Glean, Stephane BROCKS, RN (Inactive) as Oncology Nurse Navigator Belinda Cough, MD as Consulting Physician (General Surgery) Loretha Ash, MD as Consulting Physician (Hematology and Oncology) Shannon Agent, MD as Consulting Physician (Radiation Oncology)    Name / MRN / DOB: [name]  [id number]  1956-04-04   Date of visit: 02/16/2024   Chief Complaint/Reason for visit: breast pain and fever    ASSESSMENT AND PLAN Patient is a 68 y.o. female with oncologic history of malignant neoplasm of upper-outer quadrant of right breast in female, estrogen receptor positivefollowed by Dr. Loretha.  I have viewed most recent oncology note and lab work.  #Malignant neoplasm of upper-outer quadrant of right breast in female, estrogen receptor positive  - Scheduled to start Anastrozole  03/05/2024 - Next appointment with oncologist is 05/26/24   #Right breast pain - No palpable abscess or mass, does have diffuse tenderness. Patient having fevers otherwise no infectious symptoms to suggest source. -Concern for cellulitis. With history of seroma requiring drainage x 3 and now breast pain with fever, will order CT chest for further evaluation. - Engaged in shared decision making with patient. Antibiotic treatment for breast cellulitis would be keflex  vs bactrim vs clindamycin . Patient has penicillin allergy although has tolerated Keflex  in the past. She prefers to try Keflex  now. Discussed what to do if she develops symptoms of allergic reaction. Patient is comfortable with this plan and understands the risk. She has multiple epi pends at home if needed. With her history of radiation will prescribe Keflex  taper.  - Labs today show mildly elevated WBC  11.8 and ANC 9.8.CMP overall unremarkable. - Will follow up with patient regarding CT results. She prefers to continue tylenol  and aleve for pain without adding narcotics. Prescription sent to pharmacy for Zofran  for her intermittent nausea.   Strict ED precautions discussed should symptoms worsen.   HEME/ONC HISTORY Oncology History  Malignant neoplasm of upper-outer quadrant of right breast in female, estrogen receptor positive (HCC)  10/24/2023 Mammogram   Irregular mass in the right breast which is indeterminate. Additional views with possible US  are recommended. US  confirmed 9 * 8 mm mass in the right breast.   11/03/2023 Pathology Results   Right breast needle core biopsy 8 cmfn showed IDC, grade 2  The tumor cells are NEGATIVE for Her2 (1+). Estrogen Receptor:  95%, POSITIVE, STRONG STAINING INTENSITY Progesterone Receptor:  100%, POSITIVE, STRONG STAINING INTENSITY Proliferation Marker Ki67:  20%    11/17/2023 Initial Diagnosis   Malignant neoplasm of upper-outer quadrant of right breast in female, estrogen receptor positive (HCC)   11/19/2023 Cancer Staging   Staging form: Breast, AJCC 8th Edition - Clinical stage from 11/19/2023: Stage IA (cT1b, cN0, cM0, G2, ER+, PR+, HER2-) - Signed by Loretha Ash, MD on 11/19/2023 Stage prefix: Initial diagnosis Histologic grading system: 3 grade system    Genetic Testing   [facility] CancerNext-Expanded Panel+RNA was Negative. Report date is 11/25/2023.   The CancerNext-Expanded gene panel offered by [facility] and includes sequencing, rearrangement, and RNA analysis for the following 76 genes: AIP, ALK, APC, ATM, AXIN2, BAP1, BARD1, BMPR1A, BRCA1, BRCA2, BRIP1, CDC73, CDH1, CDK4, CDKN1B, CDKN2A, CEBPA, CHEK2, CTNNA1, DDX41, DICER1, ETV6, FH, FLCN, GATA2, LZTR1, MAX, MBD4, MEN1, MET, MLH1, MSH2, MSH3, MSH6, MUTYH,  NF1, NF2, NTHL1, PALB2, PHOX2B, PMS2, POT1, PRKAR1A, PTCH1, PTEN, RAD51C, RAD51D, RB1, RET, RUNX1, SDHA, SDHAF2, SDHB, SDHC,  SDHD, SMAD4, SMARCA4, SMARCB1, SMARCE1, STK11, SUFU, TMEM127, TP53, TSC1, TSC2, VHL, and WT1 (sequencing and deletion/duplication); EGFR, HOXB13, KIT, MITF, PDGFRA, POLD1, and POLE (sequencing only); EPCAM and GREM1 (deletion/duplication only).     12/22/2023 Cancer Staging   Staging form: Breast, AJCC 8th Edition - Pathologic: Stage IA (pT1c, pN0, cM0, G2, ER+, PR+, HER2-) - Signed by Wyatt Leeroy HERO, PA-C on 12/22/2023 Histologic grading system: 3 grade system       INTERVAL HISTORY  Discussed the use of AI scribe software for clinical note transcription with the patient, who gave verbal consent to proceed.    [name] is a 68 y.o. female with oncologic history as above presenting to [facility] today with chief complaint of breast pain. Patient presents unaccompanied to visit today.  She has experienced right breast swelling and pain x 3 days after a day of physical activity with her grandchildren.  She was playing at the beach with grandson and carried him multiple times which is a change from her typical physical activity. The pain, initially mild, has progressively worsened. She switched to a sports bra for compression due to the swelling. The pain is located underneath and on the right side of the breast, deep inside, and is sore to touch on the top. The patient reports that the pain feels different from previous seromas. Her last seroma drainage was in May, with 130 cc, 110 cc, and 30 cc of fluid drained on separate occasions. No fever was associated with previous seromas.  She feels cold and experiences chills, which is unusual for her. She is experiencing intermittent nausea without abdominal pain. She has been taking Aleve and Tylenol  for pain, despite her gastroenterologist advising against NSAIDs. Her temperature was recorded at 100.63F after taking Tylenol  at 10:30 AM.  No urinary symptoms, cough, congestion, or sore throat. 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       ROS  All other systems are reviewed and are negative for acute change except as noted in the HPI.    Allergies  Allergen Reactions   Doxycycline  Diarrhea and Nausea Only    Nausea and diarrhea started a few hours after taking medication.  Diarrhea and nausea continue 2 days after discontinuation.   Hydrocodone  Bit-Homatrop Mbr Itching, Anxiety and Other (See Comments)    Made pt confused    Penicillins Shortness Of Breath and Swelling    TONGUE SWELLING Has patient had a PCN reaction causing immediate rash, facial/tongue/throat swelling, SOB or lightheadedness with hypotension: Unknown Has patient had a PCN reaction causing severe rash involving mucus membranes or skin necrosis: Unknown Has patient had a PCN reaction that required hospitalization: Was already in the hospital when happened Has patient had a PCN reaction occurring within the last 10 years: Yes able to take Augmentin If all of the above answers are NO, then may proc   Promethazine Hcl Shortness Of Breath   Clarithromycin Nausea And Vomiting   Statins Other (See Comments)    LEG CRAMPS   Kiwi Extract Swelling     Past Medical History:  Diagnosis Date   Arthritis    neck, knees, ankles, feet, back   Asthma, mild intermittent    prn inhaler   Breast  cancer (HCC)    Cervical spondylosis    Chronic dyspnea    per pt gets winded w/ stairs, but recovers quickly   Diverticular disease of colon    followed by dr kristie (GI)---  chronic w/ chronic diarrhea   Essential hypertension    Fatty infiltration of liver    followed by dr kristie--- abd ultrasound in epic 08-31-2020   Frequency of urination    History of chest pain    evaluated by cardiologist-- dr pietro,  11/ 2017 nuclear stress test suggest ischemia,  06/2016 cardiac cath , showed normal coronary anatomy and lvf   History of  COVID-19    per pt in 2019   treated with presumed covid had  mild to moderate symptoms that resolved   History of diverticulitis of colon    05/ 2022   History of TIA (transient ischemic attack) 06/2003   Hyperlipidemia    Hypertension    Limitation of joint motion of neck    per pt due to bone spurs C2-5   Mild intermittent asthma    followed by pcp   PMB (postmenopausal bleeding)    Pre-diabetes    Pulmonary nodule    followed by pcp,  last hest CT in epic 04-21-2018   Stenosis of cervix    SUI (stress urinary incontinence, female)    Thickened endometrium    TMJ syndrome    Wears glasses      Past Surgical History:  Procedure Laterality Date   APPENDECTOMY  1979   BREAST ENHANCEMENT SURGERY Bilateral 1988   per pt implants removed 2002   BUNIONECTOMY Left 07/17/2007   @[facility]    BUNIONECTOMY Right 05/26/2014   Procedure: RIGHT FOOT LAPIDUS BUNION CORRECTION AMD MODIFIED MCBRIDE BUNIONECTOMY;  Surgeon: Norleen Armor, MD;  Location: [facility];  Service: Orthopedics;  Laterality: Right;   CARDIAC CATHETERIZATION N/A 06/28/2016   Procedure: Left Heart Cath and Coronary Angiography;  Surgeon: Peter M Swaziland, MD;  Location: [facility];  Service: Cardiovascular;  Laterality: N/A;   CESAREAN SECTION  1986   COLONOSCOPY  12/27/2020   by dr kristie   FINGER SURGERY Left 07/28/2015   @[facility]  :  left thumb knuckle collasped  arthroplasty   FOOT HARDWARE REMOVAL  04/25/2016   @[facility] ;   right foot   FOOT SURGERY Right 04/04/2015   @[facility] ;   first and second toe's   GANGLION CYST EXCISION Right 2016   wrist   HYSTEROSCOPY WITH D & C N/A 05/24/2021   Procedure: DILATATION AND CURETTAGE /HYSTEROSCOPY and MyoSure;  Surgeon: Rendell Calton LABOR, DO;  Location: [facility];  Service: Gynecology;  Laterality: N/A;   NASAL SEPTUM SURGERY  1981   RADIOACTIVE SEED GUIDED AXILLARY SENTINEL LYMPH NODE Right 11/27/2023   Procedure: RIGHT BREAST LUMPECTOMY AFTER  MAGNETIC SEED LOCALIZATION AND SENTINEL LYMPH NODE BIOPSY;  Surgeon: Belinda Cough, MD;  Location:  SURGERY CENTER;  Service: General;  Laterality: Right;  GEN w/PEC BLOCK RIGHT BREAST SEED LOCALIZED LUMPECTOMY WITH AXILLARY SENTINEL LYMPH NODE BIOPSY   ROTATOR CUFF REPAIR W/ DISTAL CLAVICLE EXCISION Left 08/18/2008   @[facility]  by dr amy   SHOULDER ARTHROSCOPY W/ LABRAL REPAIR Left 06/07/2008   @[facility] ;  and SAD  by dr amy   TONSILLECTOMY  1964   TUBAL LIGATION Bilateral 1999    Social History   Socioeconomic History   Marital status: Married    Spouse name: Not on file   Number of children: 2  Years of education: Not on file   Highest education level: Not on file  Occupational History   Not on file  Tobacco Use   Smoking status: Former    Current packs/day: 0.00    Types: Cigarettes    Start date: 09/13/1975    Quit date: 09/12/2005    Years since quitting: 18.4   Smokeless tobacco: Never  Vaping Use   Vaping status: Never Used  Substance and Sexual Activity   Alcohol use: Yes    Comment: 1   Drug use: No   Sexual activity: Not on file  Other Topics Concern   Not on file  Social History Narrative   Right hand   Lives with husband   One story home   Does work in Airline pilot   Drinks caffeine   Social Drivers of Health   Financial Resource Strain: Not on file  Food Insecurity: No Food Insecurity (11/19/2023)   Hunger Vital Sign    Worried About Running Out of Food in the Last Year: Never true    Ran Out of Food in the Last Year: Never true  Transportation Needs: No Transportation Needs (11/19/2023)   PRAPARE - Administrator, Civil Service (Medical): No    Lack of Transportation (Non-Medical): No  Physical Activity: Not on file  Stress: Not on file  Social Connections: Unknown (12/18/2021)   [facility]   Social Network    Social Network: Not on file  Intimate Partner Violence: Unknown (11/09/2021)   Received from [facility]   HITS    Physically Hurt: Not on file    Insult or Talk Down To: Not on file    Threaten Physical Harm: Not on file    Scream or Curse: Not on file    Family History  Problem Relation Age of Onset   Lung cancer Mother 83   Diabetes Mother    Thalassemia Mother    Colon polyps Mother    Atrial fibrillation Father    Lung cancer Father 38   Colon cancer Father 35   Bone cancer Father 77   Prostate cancer Father        dx. ?, patient reports all cancers were separate primaries   Thalassemia Sister    Lymphoma Brother 55   Thalassemia Maternal Aunt    Heart disease Paternal Aunt    Colon cancer Paternal Aunt        dx. >50   Uterine cancer Paternal Aunt        dx. >50   Colon cancer Paternal Aunt        dx. >50   Uterine cancer Paternal Aunt        dx. >50   Colon cancer Paternal Grandmother 22 - 85   Colon cancer Cousin 78 - 86       maternal first cousin   Colon cancer Cousin 79 - 69       paternal first cousin   Rectal cancer Neg Hx    Stomach cancer Neg Hx      Current Outpatient Medications:    cephALEXin  (KEFLEX ) 500 MG capsule, Take 2 capsules (1,000 mg total) by mouth every 6 (six) hours for 2 days, THEN 1 capsule (500 mg total) every 6 (six) hours for 8 days., Disp: 48 capsule, Rfl: 0   ondansetron  (ZOFRAN ) 8 MG tablet, Take 1 tablet (8 mg total) by mouth every 8 (eight) hours as needed for nausea or vomiting., Disp: 20  tablet, Rfl: 0   albuterol  (PROVENTIL ) (2.5 MG/3ML) 0.083% nebulizer solution, Take 3 mLs (2.5 mg total) by nebulization every 6 (six) hours as needed for wheezing or shortness of breath., Disp: 150 mL, Rfl: 1   albuterol  (VENTOLIN  HFA) 108 (90 Base) MCG/ACT inhaler, Inhale 1-2 puffs into the lungs every 4 (four) hours as needed for wheezing or shortness of breath., Disp: 18 g, Rfl: 0   anastrozole  (ARIMIDEX ) 1 MG tablet, Take 1 tablet (1 mg total) by mouth daily., Disp: 90 tablet, Rfl: 3   budesonide -formoterol  (SYMBICORT ) 80-4.5 MCG/ACT  inhaler, Take 2 puffs first thing in am and then another 2 puffs about 12 hours later., Disp: 1 each, Rfl: 12   cetirizine  (ZYRTEC ) 10 MG tablet, Take 1 tablet (10 mg total) by mouth daily., Disp: 90 tablet, Rfl: 3   citalopram  (CELEXA ) 10 MG tablet, Take 1 tablet (10 mg total) by mouth daily., Disp: 90 tablet, Rfl: 3   ezetimibe  (ZETIA ) 10 MG tablet, Take 1 tablet (10 mg total) by mouth daily., Disp: 90 tablet, Rfl: 1   famotidine  (PEPCID ) 40 MG tablet, Take 40 mg by mouth 2 (two) times daily. (Patient not taking: Reported on 12/22/2023), Disp: , Rfl:    fluticasone  (FLONASE ) 50 MCG/ACT nasal spray, Place 2 sprays into both nostrils 2 (two) times daily as needed., Disp: , Rfl: 5   losartan -hydrochlorothiazide  (HYZAAR) 50-12.5 MG tablet, Take 1 tablet by mouth daily., Disp: , Rfl:    montelukast  (SINGULAIR ) 10 MG tablet, Take 1 tablet (10 mg total) by mouth at bedtime., Disp: 90 tablet, Rfl: 3   potassium chloride  (KLOR-CON  10) 10 MEQ tablet, Take 1 tablet (10 mEq total) by mouth 3 (three) times daily. (Patient not taking: Reported on 12/22/2023), Disp: 90 tablet, Rfl: 0   REPATHA  SURECLICK 140 MG/ML SOAJ, ADMINISTER 1 ML UNDER THE SKIN EVERY 14 DAYS, Disp: 2 mL, Rfl: 11   tirzepatide  (ZEPBOUND ) 10 MG/0.5ML Pen, Inject 10 mg into the skin once a week., Disp: 6 mL, Rfl: 3   traMADol  (ULTRAM ) 50 MG tablet, Take 1-2 tablets (50-100 mg total) by mouth every 6 (six) hours as needed for moderate pain (pain score 4-6) or severe pain (pain score 7-10). (Patient not taking: Reported on 12/22/2023), Disp: 30 tablet, Rfl: 0   Vitamin D , Ergocalciferol , (DRISDOL ) 1.25 MG (50000 UNIT) CAPS capsule, Take 1 capsule (50,000 Units total) by mouth every 7 (seven) days. (Patient not taking: Reported on 12/22/2023), Disp: 12 capsule, Rfl: 0  PHYSICAL EXAM ECOG FS:1 - Symptomatic but completely ambulatory    Vitals:   02/16/24 1204  BP: 109/63  Pulse: 94  Resp: 16  Temp: 98.4 F (36.9 C)  TempSrc: Temporal  SpO2:  100%  Weight: 196 lb 4.8 oz (89 kg)   Physical Exam Vitals and nursing note reviewed. Exam conducted with a chaperone present.  Constitutional:      Appearance: She is not ill-appearing or toxic-appearing.  HENT:     Head: Normocephalic.  Eyes:     Conjunctiva/sclera: Conjunctivae normal.  Cardiovascular:     Rate and Rhythm: Normal rate and regular rhythm.     Pulses: Normal pulses.     Heart sounds: Normal heart sounds.  Pulmonary:     Effort: Pulmonary effort is normal.     Breath sounds: Normal breath sounds.  Chest:     Comments: Please see media below.  Swollen right breast. Diffuse right breast tenderness without palpable mass. No palpable fluctuance. No palpable right axillary lymphadenopathy. Abdominal:  General: There is no distension.  Musculoskeletal:     Cervical back: Normal range of motion.  Skin:    General: Skin is warm and dry.  Neurological:     Mental Status: She is alert.        LABORATORY DATA I have reviewed the data as listed    Latest Ref Rng & Units 02/16/2024    1:13 PM 11/19/2023   11:30 AM 10/19/2023    1:58 PM  CBC  WBC 4.0 - 10.5 K/uL 11.8  5.4  6.9   Hemoglobin 12.0 - 15.0 g/dL 85.9  84.9  85.1   Hematocrit 36.0 - 46.0 % 40.4  43.5  43.6   Platelets 150 - 400 K/uL 167  201  228         Latest Ref Rng & Units 02/16/2024    1:13 PM 11/19/2023   11:30 AM 10/19/2023    1:58 PM  CMP  Glucose 70 - 99 mg/dL 894  96  895   BUN 8 - 23 mg/dL 19  19  22    Creatinine 0.44 - 1.00 mg/dL 8.86  8.93  8.92   Sodium 135 - 145 mmol/L 136  139  136   Potassium 3.5 - 5.1 mmol/L 3.5  4.0  4.3   Chloride 98 - 111 mmol/L 100  103  100   CO2 22 - 32 mmol/L 29  29  22    Calcium  8.9 - 10.3 mg/dL 9.7  89.5  9.4   Total Protein 6.5 - 8.1 g/dL 7.2  7.6  6.9   Total Bilirubin 0.0 - 1.2 mg/dL 0.9  0.6  1.1   Alkaline Phos 38 - 126 U/L 70  75  71   AST 15 - 41 U/L 10  21  28    ALT 0 - 44 U/L 11  26  15         RADIOGRAPHIC STUDIES (from last 24  hours if applicable) I have personally reviewed the radiological images as listed and agreed with the findings in the report. No results found.    Visit Diagnosis: 1. Malignant neoplasm of upper-outer quadrant of right breast in female, estrogen receptor positive (HCC)   2. Fever, unspecified fever cause   3. Breast pain      Orders Placed This Encounter  Procedures   CT Chest W Contrast    Standing Status:   Future    Expected Date:   02/17/2024    Expiration Date:   02/15/2025    If indicated for the ordered procedure, I authorize the administration of contrast media per Radiology protocol:   Yes    Does the patient have a contrast media/X-ray dye allergy?:   No    Preferred imaging location?:   [facility]   CBC with Differential (Cancer Center Only)    Standing Status:   Future    Number of Occurrences:   1    Expected Date:   02/16/2024    Expiration Date:   02/15/2025   CMP (Cancer Center only)    Standing Status:   Future    Number of Occurrences:   1    Expected Date:   02/16/2024    Expiration Date:   02/15/2025    All questions were answered. The patient knows to call the clinic with any problems, questions or concerns. No barriers to learning was detected.  A total of more than 30 minutes were spent on this encounter with face-to-face time and non-face-to-face  time, including preparing to see the patient, ordering tests and/or medications, counseling the patient and coordination of care as outlined above.    Thank you for allowing me to participate in the care of this patient.    Cleotha Tsang E  Walisiewicz, PA-C Department of Hematology/Oncology [facility] Phone: [phone]  [phone]    02/16/2024 3:53 PM

## 2024-02-16 NOTE — Therapy (Signed)
 OUTPATIENT PHYSICAL THERAPY BREAST CANCER TREATMENT   Patient Name: [name] MRN: [id number] DOB:1956/07/01, 68 y.o., female Today's Date: 02/16/2024  END OF SESSION:  PT End of Session - 02/16/24 0803     Visit Number 8    Number of Visits 14    Date for PT Re-Evaluation 02/24/24    PT Start Time 0800    PT Stop Time 0815    PT Time Calculation (min) 15 min    Activity Tolerance Treatment limited secondary to medical complications (Comment);Patient limited by pain;Other (comment)   increased sweling/pain right breast over weekend with low grade fever   Behavior During Therapy --   limited by pain         Past Medical History:  Diagnosis Date   Arthritis    neck, knees, ankles, feet, back   Asthma, mild intermittent    prn inhaler   Breast cancer (HCC)    Cervical spondylosis    Chronic dyspnea    per pt gets winded w/ stairs, but recovers quickly   Diverticular disease of colon    followed by dr kristie (GI)---  chronic w/ chronic diarrhea   Essential hypertension    Fatty infiltration of liver    followed by dr kristie--- abd ultrasound in epic 08-31-2020   Frequency of urination    History of chest pain    evaluated by cardiologist-- dr pietro,  11/ 2017 nuclear stress test suggest ischemia,  06/2016 cardiac cath , showed normal coronary anatomy and lvf   History of COVID-19    per pt in 2019   treated with presumed covid had  mild to moderate symptoms that resolved   History of diverticulitis of colon    05/ 2022   History of TIA (transient ischemic attack) 06/2003   Hyperlipidemia    Hypertension    Limitation of joint motion of neck    per pt due to bone spurs C2-5   Mild intermittent asthma    followed by pcp   PMB (postmenopausal bleeding)    Pre-diabetes    Pulmonary nodule    followed by pcp,  last hest CT in epic 04-21-2018   Stenosis of cervix    SUI (stress urinary incontinence, female)    Thickened endometrium    TMJ syndrome    Wears  glasses    Past Surgical History:  Procedure Laterality Date   APPENDECTOMY  1979   BREAST ENHANCEMENT SURGERY Bilateral 1988   per pt implants removed 2002   BUNIONECTOMY Left 07/17/2007   @[facility]    BUNIONECTOMY Right 05/26/2014   Procedure: RIGHT FOOT LAPIDUS BUNION CORRECTION AMD MODIFIED MCBRIDE BUNIONECTOMY;  Surgeon: Norleen Armor, MD;  Location: [facility];  Service: Orthopedics;  Laterality: Right;   CARDIAC CATHETERIZATION N/A 06/28/2016   Procedure: Left Heart Cath and Coronary Angiography;  Surgeon: Peter M Swaziland, MD;  Location: [facility] INVASIVE CV LAB;  Service: Cardiovascular;  Laterality: N/A;   CESAREAN SECTION  1986   COLONOSCOPY  12/27/2020   by dr kristie   FINGER SURGERY Left 07/28/2015   @[facility]  :  left thumb knuckle collasped  arthroplasty   FOOT HARDWARE REMOVAL  04/25/2016   @[facility] ;   right foot   FOOT SURGERY Right 04/04/2015   @[facility] ;   first and second toe's   GANGLION CYST EXCISION Right 2016   wrist   HYSTEROSCOPY WITH D & C N/A 05/24/2021   Procedure: DILATATION AND CURETTAGE /HYSTEROSCOPY and MyoSure;  Surgeon: Rendell Ruby A, DO;  Location: [facility];  Service: Gynecology;  Laterality: N/A;   NASAL SEPTUM SURGERY  1981   RADIOACTIVE SEED GUIDED AXILLARY SENTINEL LYMPH NODE Right 11/27/2023   Procedure: RIGHT BREAST LUMPECTOMY AFTER MAGNETIC SEED LOCALIZATION AND SENTINEL LYMPH NODE BIOPSY;  Surgeon: Belinda Cough, MD;  Location: [facility];  Service: General;  Laterality: Right;  GEN w/PEC BLOCK RIGHT BREAST SEED LOCALIZED LUMPECTOMY WITH AXILLARY SENTINEL LYMPH NODE BIOPSY   ROTATOR CUFF REPAIR W/ DISTAL CLAVICLE EXCISION Left 08/18/2008   @[facility]  by dr amy   SHOULDER ARTHROSCOPY W/ LABRAL REPAIR Left 06/07/2008   @[facility] ;  and SAD  by dr amy   TONSILLECTOMY  1964   TUBAL LIGATION Bilateral 1999   Patient Active Problem List   Diagnosis Date Noted   Genetic testing 11/26/2023   Malignant neoplasm of  upper-outer quadrant of right breast in female, estrogen receptor positive (HCC) 11/17/2023   At risk for Clostridium difficile infection 10/04/2022   Chronic renal disease, stage 2, mildly decreased glomerular filtration rate (GFR) between 60-89 mL/min/1.73 square meter 10/02/2022   Chronic fatigue 10/01/2022   Prediabetes 10/01/2022   Memory loss 07/05/2022   Hot flash, menopausal 10/23/2021   Cough variant asthma 06/19/2021   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/11/2021   Spondylolisthesis of lumbar region 04/29/2017   Spondylosis without myelopathy or radiculopathy, cervical region 04/29/2017   Pain from implanted hardware 04/04/2016   Morbid obesity (HCC) 03/19/2016   Primary osteoarthritis of first carpometacarpal joint of left hand 07/13/2015   History of TIA (transient ischemic attack) 03/14/2015   Metatarsalgia of right foot 03/08/2015   Routine general medical examination at a health care facility 11/23/2012   GERD 06/14/2009   Essential hypertension 06/16/2008   Hyperlipidemia 12/29/2007    PCP:   REFERRING PROVIDER: Lauraine Golden, MD  REFERRING DIAG: s/p Right Breast Cancer  THERAPY DIAG:  Malignant neoplasm of upper-outer quadrant of right breast in female, estrogen receptor positive (HCC)  Abnormal posture  Chronic right shoulder pain  Swelling of breast  Lymphedema, not elsewhere classified  Rationale for Evaluation and Treatment: Rehabilitation  ONSET DATE: 01/06/2024 SUBJECTIVE:                                                                                                                                                                                           SUBJECTIVE STATEMENT: I started having severe left breast swelling and pain on Saturday. My breast looks like a Cantalope and it had been doing so much better. I had been lifting my 15 year old grandson. It hurts down my right UE into my wrist. I slept 29  hours yesterday, and I had a low  grade fever yesterday and chills. I have been nauseated as well, and threw up one time. I called the nurse line and they suggested I go to the ED, but I didn't want to do that.   PERTINENT HISTORY:  Patient was diagnosed on 10/24/2023 with right grade 2 invasive ductal carcinoma breast cancer. It measures 9 mm and is located in the upper outer quadrant. It is ER/PR positive and HER2 negative with a Ki67 of 20%. She had a Right lumpectomy with SLNB on 11/27/2023 with 0+/2 LN's. She developed a seroma in the axilla and lateral breast and had 160 c of fluid drained on 12/08/2023.  5 days later they drained and got 120 cc,She had another 10 cc drained on 12/22/2023. She is presently undergoing radiation.She has a history of endometrial cancer in 2022 and a history of a TIA   PATIENT GOALS:  Reassess how my recovery is going related to arm function, pain, and swelling.  PAIN:  Are you having pain? Not currently, just mildly tender to touch    PRECAUTIONS: Recent Surgery, right UE Lymphedema risk, Prior TIA  RED FLAGS: None   ACTIVITY LEVEL / LEISURE: walking   OBJECTIVE:   PATIENT SURVEYS:  QUICK DASH: 31.82  OBSERVATIONS: Large area of swelling with induration right upper breast  POSTURE:  Forward head and rounded shoulders posture    LYMPHEDEMA ASSESSMENT:   UPPER EXTREMITY AROM/PROM:   A/PROM RIGHT   eval   RIGHT 01/13/2024  Shoulder extension 57 45  Shoulder flexion 160 140  Shoulder abduction 169 115 tight under arm  Shoulder internal rotation 67 45  Shoulder external rotation 82 60                          (Blank rows = not tested)   A/PROM LEFT   eval  Shoulder extension 48  Shoulder flexion 137  Shoulder abduction 168  Shoulder internal rotation 57  Shoulder external rotation 75                          (Blank rows = not tested)   CERVICAL AROM: All within normal limits   UPPER EXTREMITY STRENGTH: WNL   LYMPHEDEMA ASSESSMENTS (in cm):    LANDMARK RIGHT    eval RIGHT 01/13/2024  10 cm proximal to olecranon process 32.9 33.8  Olecranon process 25 24.7  10 cm proximal to ulnar styloid process 24.5 23.0  Just proximal to ulnar styloid process 16.9 17.4  Across hand at thumb web space 19.5 19.6  At base of 2nd digit 6.6 6.3  (Blank rows = not tested)   LANDMARK LEFT   eval LEFT 01/13/2024  10 cm proximal to olecranon process 35 34.6  Olecranon process 25.3 25.5  10 cm proximal to ulnar styloid process 23 22.7  Just proximal to ulnar styloid process 16.4 16.5  Across hand at thumb web space 18.1 18.7  At base of 2nd digit 6.5 6.4  (Blank rows = not tested)    Surgery type/Date: right breast lumpectomy and sentinel lymph node biopsy on 11/27/2023.  Number of lymph nodes removed: 0+/2 Current/past treatment (chemo, radiation, hormone therapy): radiation Other symptoms:  Heaviness/tightness No Pain Yes Pitting edema No Infections No Decreased scar mobility Yes Stemmer sign No   TODAY'S TREATMENT:  02/16/2024 Right breast with significant increase in sweling, mild redness. Pulling down arm and tightness  with arm pain consistent with cording. Pt advised not able to be seen today until evaluated due to concerns of infection given rapid swelling increase, fever etc. Pt is going to call cancer center and see MD today. Advised pt to contact me and let me know how she is.   02/11/24: Manual Therapy MLD to Rt breast in supine: Short neck, superficial and deep abdominals, Lt axillary and pectoral nodes, anterior intact thorax sequence and anterior inter-axillary anastomosis, then Rt inguinal nodes, Rt axillo-inguinal anastomosis and then focused on Rt medial and superior breast, then in Supine and later into Lt S/L for lateral and inferior breast redirecting towards lateral anastomosis. Unable to finishing retracing steps in supine today as pt reported near end of session she had to leave for an MD appt.  MFR to right axillary region, upper arm  and forearm areas of cording, pt still with mod-max tenderness along cording in axilla PROM right shoulder flexion, scaption, and D2 with scapular depression by therapist throughout   02/09/2024 MFR to right axillary region, upper arm and forearm areas of cording PROM right shoulder flexion, scaption, IR and ER. MLD to Rt breast in supine: Short neck, 5 Breaths, Lt axillary  nodes,  and anterior inter-axillary anastomosis, then Rt inguinal nodes, Rt axillo-inguinal anastomosis and then focused on Rt medial and superior breast, then in Supine and later into Lt S/L for lateral and inferior breast redirecting towards lateral anastomosis then finished retracing all steps in supine ending with LN's. Therapist demonstrated all steps first and with VC's and hand over hand cueing therapist had pt perform all steps. Gave pt illustrated and written instructions, and showed pt poster in my room demonstrating how superficial lymphatics are so the need for gentle stretch, without a lot of pressure.  02/02/2024  MLD to Rt breast in supine: Short neck, superficial and deep abdominals, Lt axillary and pectoral nodes,  and anterior inter-axillary anastomosis, then Rt inguinal nodes, Rt axillo-inguinal anastomosis and then focused on Rt medial and superior breast, then into Lt S/L for lateral and inferior breast redirecting towards lateral anastomosis then finished retracing all steps in supine P/ROM to Rt shoulder in supine during MLD into flex, abd and D2 , IR and ER to pts tolerance and scapular depression by therapist throughout   01/21/2024 Treatment decreased due to pt late from radiation boost MLD to Rt breast in supine: Short neck, superficial and deep abdominals, Lt axillary and pectoral nodes,  and anterior inter-axillary anastomosis, then Rt inguinal nodes, Rt axillo-inguinal anastomosis and then focused on Rt medial and superior breast, then into Lt S/L for lateral and inferior breast redirecting towards  lateral anastomosis then finished retracing all steps in supine P/ROM to Rt shoulder in supine during MLD into flex, abd and D2 to pts tolerance and scapular depression by therapist throughout    01/19/24: Manual Therapy MLD to Rt breast in supine: Short neck, superficial and deep abdominals, Lt axillary and pectoral nodes, anterior intact thorax sequence, and anterior inter-axillary anastomosis, then Rt inguinal nodes, Rt axillo-inguinal anastomosis and then focused on Rt medial and superior breast, then into Lt S/L for lateral and inferior breast redirecting towards lateral anastomosis then finished retracing all steps in supine P/ROM to Rt shoulder in supine during MLD into flex, abd and D2 to pts tolerance and scapular depression by therapist throughout  MFR to multiple cords in Rt axilla and as pt could tolerate, she is very tender to touch in medial upper arm over cording Therapeutic  Exercises Pulleys into flex x 2 mins, VC's to remind pt to decrease scap compensation Roll yellow ball up wall into flex x 7, pt with no shoulder discomfort with this today     PATIENT EDUCATION:  Education details: POC, LOS, treatment interventions, 4 post op exercises, NTS for cords Person educated: Patient Education method: Explanation, Demonstration, Verbal cues, and Handouts Education comprehension: verbalized understanding, returned demonstration, and verbal cues required  HOME EXERCISE PROGRAM: Instructed 4 post op exercises with pt holding Right wrist with left hand for AA flexion in supine, supine stargazer, scapular retraction, wall slides. Showed wrist extension with elbow ext and gentle shoulder ext to stretch cords. Advised to do with appropriate tightness only and not to force. Should not have pain in shoulder. To perform 2x's per day. Also educated in scar massage to incision area, and advised to watch ABC video.   ASSESSMENT:  CLINICAL IMPRESSION: Significant increase in swelling/pain  over the weekend with low grade fever and pt sleeping 29 hours. Advised not a good idea to treat today, and to see MD for their recommendations first.   Pt will benefit from skilled therapeutic intervention to improve on the following deficits: Decreased knowledge of precautions, impaired UE functional use, pain, decreased ROM, postural dysfunction.   PT treatment/interventions: ADL/Self care home management, [phone]- PT Re-evaluation, [phone]- Physical Performance Testing, 97110-Therapeutic exercises, 97530- Therapeutic activity, [id number]- Neuromuscular re-education, 97535- Self Care, [id number]- Manual therapy, 97760- Orthotic Initial, [phone]- Orthotic/Prosthetic subsequent, and Patient/Family education   GOALS: Goals reviewed with patient? Yes  LONG TERM GOALS:  (STG=LTG)  GOALS Name Target Date  Goal status  1 Pt will demonstrate she has regained full shoulder ROM and function post operatively compared to baselines.  Baseline: 02/24/2024 INITIAL  2 Pt will be independent in Right shoulder ROM and strengthening 02/24/2024 INITIAL  3 Pt will be independent in right breast MLD to decrease breast swelling 02/24/2024 INITIAL  4 Pt will be compliant with compression bra to decrease right breast swelling 02/24/2024 INITIAL     PLAN:  PT FREQUENCY/DURATION: 2x/week x 6 weeks  PLAN FOR NEXT SESSION: Check axillary skin, pulleys, Review self MLD,HEP and progress AA/A/ROM stretches, supine over half foam roll?, right shoulder strength, Cont and review MLD and cont MFR to cords, review scar massage   [facility]  [address], [address]  [place] [address]  [phone]   Grayce JINNY Sheldon, PT 02/16/2024, 8:18 AM

## 2024-02-17 ENCOUNTER — Telehealth: Payer: Self-pay | Admitting: *Deleted

## 2024-02-17 NOTE — Telephone Encounter (Signed)
 This RN called [facility] to obtain an ASAP appt with surgeon per noted CT showing cellulitis and seroma.  Per discussion with Sari in Triage she is contacting MD for appt for tomorrow.  This RN called to inform- she states she  I do not feel any better    She has taking 6 doses of the Keflex .  She states her temp is maintaining around 99.5.  She is not having chills.  She does state concern for  do not want to go septic  This RN validated her concerns as well as plan is to get to surgeon asap tomorrow.  This RN also informed her that if her temp was to go above 100.5 and or she develops chills and rigors she is to proceed to the ER.  Pt verbalized understanding.

## 2024-02-18 ENCOUNTER — Encounter: Payer: Self-pay | Admitting: Family Medicine

## 2024-02-18 ENCOUNTER — Ambulatory Visit

## 2024-02-19 ENCOUNTER — Telehealth: Payer: Self-pay | Admitting: *Deleted

## 2024-02-19 MED ORDER — LEVOFLOXACIN 750 MG PO TABS: 750.0000 mg | ORAL_TABLET | Freq: Every day | ORAL | 0 refills | Status: DC

## 2024-02-19 NOTE — Telephone Encounter (Signed)
 This RN spoke with pt post her visit to surgeon's with noted 60 CC of fluid drained.  Orie states she overall does not feel much improvement in symptoms - with only overall improvement is she is not having fevers.  She states she continues to have chills.  She feels her breast is very tender.   I am just basically sleeping a lot  This RN reviewed above with covering provider and obtained an additional antibiotic to take with the Keflex - to cross cover.  Discussed above with pt - verified pharmacy and prescription sent.  Per discussion pt verbalized understands to call if symptoms worsen or she should proceed to the ER.

## 2024-02-20 ENCOUNTER — Telehealth: Payer: Self-pay

## 2024-02-20 NOTE — Telephone Encounter (Signed)
 Pt called and LVM concerned about not feeling any better yet since starting Levaquin  with Keflex . She reports so far she has taken 2 doses or Levaquin . She voices concerns about her right breast and how it feels as hard as a cantaloupe She denies fever, but is taking Tylenol  every 4 hours around the clock, notes she is still experiencing chills, her right breast is hot to touch and feels very heavy. Advised pt taking tylenol  every 4 hours may mask her fever if she were to have one. She is asking why she did not receive IV abx. Advised pt if sx do not improve or worsen over the weekend, she should go to ED for further eval. She declines this stating she does not want to go to the ED, and she is scheduled to see Dr Belinda again Monday morning.  She voiced these concerns to Dr Belinda and states he told her this is not a surgical issue, but rather a radiation issue; however will see her Monday anyway to see if she is any better. She is concerned about the seroma worsening even since starting Levaquin . She states she does not feel heard by Dr Belinda and is not convinced this is not a surgical issue, but will reach out to Dr Shannon for further recommendation. Once again advised pt to go to ED over the weekend if these sx worsen or do not improve and advised I will route message to Dr Loretha and Dr Shannon. She verbalized thanks and understanding.

## 2024-02-23 ENCOUNTER — Ambulatory Visit
Admission: RE | Admit: 2024-02-23 | Discharge: 2024-02-23 | Disposition: A | Discharge status: Home / Self Care | Source: Ambulatory Visit | Attending: Radiation Oncology | Admitting: Radiation Oncology

## 2024-02-23 ENCOUNTER — Other Ambulatory Visit: Payer: Self-pay | Admitting: Surgery

## 2024-02-23 ENCOUNTER — Ambulatory Visit

## 2024-02-23 ENCOUNTER — Encounter: Payer: Self-pay | Admitting: Radiation Oncology

## 2024-02-23 VITALS — BP 80/57 | HR 84 | Temp 97.7°F | Resp 20 | Ht 63.0 in | Wt 193.4 lb

## 2024-02-23 DIAGNOSIS — C50411 Malignant neoplasm of upper-outer quadrant of right female breast: Secondary | ICD-10-CM | POA: Insufficient documentation

## 2024-02-23 DIAGNOSIS — Z923 Personal history of irradiation: Secondary | ICD-10-CM | POA: Diagnosis not present

## 2024-02-23 DIAGNOSIS — Z79899 Other long term (current) drug therapy: Secondary | ICD-10-CM | POA: Diagnosis not present

## 2024-02-23 DIAGNOSIS — Z79811 Long term (current) use of aromatase inhibitors: Secondary | ICD-10-CM | POA: Insufficient documentation

## 2024-02-23 DIAGNOSIS — I517 Cardiomegaly: Secondary | ICD-10-CM | POA: Insufficient documentation

## 2024-02-23 DIAGNOSIS — Z170 Estrogen receptor positive status [ER+]: Secondary | ICD-10-CM | POA: Insufficient documentation

## 2024-02-23 DIAGNOSIS — I2510 Atherosclerotic heart disease of native coronary artery without angina pectoris: Secondary | ICD-10-CM | POA: Insufficient documentation

## 2024-02-23 DIAGNOSIS — I700 Atherosclerosis of aorta: Secondary | ICD-10-CM | POA: Diagnosis not present

## 2024-02-23 DIAGNOSIS — I959 Hypotension, unspecified: Secondary | ICD-10-CM | POA: Insufficient documentation

## 2024-02-23 HISTORY — DX: Personal history of irradiation: Z92.3

## 2024-02-23 NOTE — Telephone Encounter (Signed)
 I just spoke with her. She saw Tsuei this morning and he thinks this is from xrt, and states he can't aspirate the seroma in office at this point because it is too deep. He is sending her back to [facility] for [facility]  and aspiration there if needed, if that doesn't fix it, he has to take her back to sx. She is waiting in xrt waiting room now to see Dr Shannon to see if he can offer any relief. She reports still being in 9/10 pain despite Keflex  and Levaquin .

## 2024-02-23 NOTE — Progress Notes (Signed)
 Radiation Oncology         [phone] ________________________________  Name: [name] MRN: [id number]  Date: 02/23/2024  DOB: [birth year]  Follow-Up Visit Note  CC: Rollene Almarie LABOR, MD  Rollene Almarie LABOR, *    ICD-10-CM   1. 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, ER+ / PR+ / Her2-   Interval Since Last Radiation: 27 days   Intent: Curative  Radiation Treatment Dates: First Treatment Date: 2023-12-30 -- Last Treatment Date: 2024-01-27 Site/Dose/Technique/Mode:  Plan Name: Breast_R Site: Breast, Right Technique: 3D Mode: Photon Dose Per Fraction: 2.67 Gy Prescribed Dose (Delivered / Prescribed): 18.69 Gy / 18.69 Gy Prescribed Fxs (Delivered / Prescribed): 7 / 7   Plan Name: Breast_R:1 Site: Breast, Right Technique: 3D Mode: Photon Dose Per Fraction: 2.67 Gy Prescribed Dose (Delivered / Prescribed): 21.36 Gy / 21.36 Gy Prescribed Fxs (Delivered / Prescribed): 8 / 8  Cumulative dose to the right breast was 40.05 Gy, lumpectomy cavity boost to an additional 10 Gy   Plan Name: Breast_R_Bst Site: Breast, Right Technique: 3D Mode: Photon Dose Per Fraction: 2 Gy Prescribed Dose (Delivered / Prescribed): 10 Gy / 10 Gy Prescribed Fxs (Delivered / Prescribed): 5 / 5  Narrative:  The patient returns today earlier follow-up than her usual 1 month follow-up after radiation therapy. She tolerated radiation therapy relatively well other than fatigue, mild breast swelling, and anticipated skin changes in the treatment field.    Since completing radiation therapy, she followed up with medical oncology on 02/16/24. Concern for cellulitis was noted at that time and she also endorsed having several fevers, right sided breast pain, swelling, and erythema. Labs also showed mildly  elevated WBC and she was prescribed a course of Keflex .   In this setting, a chest CT was performed on 07/14 which demonstrated a thick wall fluid collection in the right breast possibly representing a seroma or hematoma up to 8 cm in greatest dimension with surrounding parenchymal edema and skin thickening. Other findings included a contiguous or adjacent diminutive right axillary fluid collection most likely representing a seroma, a previously demonstrated benign segment 4A hepatic lesion, and findings compatible with radiation changes. CT findings otherwise showed no evidence of metastatic disease in the chest.   Based on CT findings, she met with Dr. Belinda on 07/16 and had the fluid collection aspirated at that time. 60 cc's of clear yellow fluid was aspirated from the area.      Based on Dr. Walter recommendation, she has agreed to proceed with antiestrogen therapy consisting of anastrozole . She has been instructed to start taking her anastrozole  on 03/05/24.              She again met with Dr. Belinda earlier today. Per his recommendations, her referred the patient to [facility] for further drainage as this fluid area was deep within the breast and would need ultrasound guidance to assist with needle placement.  Other pertinent imaging performed in the interval includes a bone density scan on 06/04 which showed normal bone density findings.   On evaluation today she reports pain centrally within the breast.  She denies any nipple discharge or bleeding.  She denies any chills or fever.  She has been placing her RadiaPlex radiation skin cream twice a day.  She denies any itching  or skin issues at this time.              Allergies:  is allergic to doxycycline , hydrocodone  bit-homatrop mbr, penicillins, promethazine hcl, clarithromycin, statins, and kiwi extract.  Meds: Current Outpatient Medications  Medication Sig Dispense Refill   albuterol  (PROVENTIL ) (2.5 MG/3ML) 0.083% nebulizer solution Take 3  mLs (2.5 mg total) by nebulization every 6 (six) hours as needed for wheezing or shortness of breath. 150 mL 1   albuterol  (VENTOLIN  HFA) 108 (90 Base) MCG/ACT inhaler Inhale 1-2 puffs into the lungs every 4 (four) hours as needed for wheezing or shortness of breath. 18 g 0   anastrozole  (ARIMIDEX ) 1 MG tablet Take 1 tablet (1 mg total) by mouth daily. 90 tablet 3   budesonide -formoterol  (SYMBICORT ) 80-4.5 MCG/ACT inhaler Take 2 puffs first thing in am and then another 2 puffs about 12 hours later. 1 each 12   cephALEXin  (KEFLEX ) 500 MG capsule Take 2 capsules (1,000 mg total) by mouth every 6 (six) hours for 2 days, THEN 1 capsule (500 mg total) every 6 (six) hours for 8 days. 48 capsule 0   cetirizine  (ZYRTEC ) 10 MG tablet Take 1 tablet (10 mg total) by mouth daily. 90 tablet 3   citalopram  (CELEXA ) 10 MG tablet Take 1 tablet (10 mg total) by mouth daily. 90 tablet 3   ezetimibe  (ZETIA ) 10 MG tablet Take 1 tablet (10 mg total) by mouth daily. 90 tablet 1   famotidine  (PEPCID ) 40 MG tablet Take 40 mg by mouth 2 (two) times daily. (Patient not taking: Reported on 12/22/2023)     fluticasone  (FLONASE ) 50 MCG/ACT nasal spray Place 2 sprays into both nostrils 2 (two) times daily as needed.  5   levofloxacin  (LEVAQUIN ) 750 MG tablet Take 1 tablet (750 mg total) by mouth daily. 7 tablet 0   losartan -hydrochlorothiazide  (HYZAAR) 50-12.5 MG tablet Take 1 tablet by mouth daily.     montelukast  (SINGULAIR ) 10 MG tablet Take 1 tablet (10 mg total) by mouth at bedtime. 90 tablet 3   ondansetron  (ZOFRAN ) 8 MG tablet Take 1 tablet (8 mg total) by mouth every 8 (eight) hours as needed for nausea or vomiting. 20 tablet 0   potassium chloride  (KLOR-CON  10) 10 MEQ tablet Take 1 tablet (10 mEq total) by mouth 3 (three) times daily. (Patient not taking: Reported on 12/22/2023) 90 tablet 0   REPATHA  SURECLICK 140 MG/ML SOAJ ADMINISTER 1 ML UNDER THE SKIN EVERY 14 DAYS 2 mL 11   tirzepatide  (ZEPBOUND ) 10 MG/0.5ML Pen  Inject 10 mg into the skin once a week. 6 mL 3   traMADol  (ULTRAM ) 50 MG tablet Take 1-2 tablets (50-100 mg total) by mouth every 6 (six) hours as needed for moderate pain (pain score 4-6) or severe pain (pain score 7-10). (Patient not taking: Reported on 12/22/2023) 30 tablet 0   Vitamin D , Ergocalciferol , (DRISDOL ) 1.25 MG (50000 UNIT) CAPS capsule Take 1 capsule (50,000 Units total) by mouth every 7 (seven) days. (Patient not taking: Reported on 12/22/2023) 12 capsule 0   No current facility-administered medications for this encounter.    Physical Findings: The patient is in no acute distress. Patient is alert and oriented.  height is 5' 3 (1.6 m) and weight is 193 lb 6.4 oz (87.7 kg). Her temperature is 97.7 F (36.5 C). Her blood pressure is 80/57 (abnormal) and her pulse is 84. Her respiration is 20 and oxygen saturation is 99%. .  No significant changes. Lungs are clear to  auscultation bilaterally. Heart has regular rate and rhythm. No palpable cervical, supraclavicular, or axillary adenopathy. Abdomen soft, non-tender, normal bowel sounds.  Left Breast: no palpable mass, nipple discharge or bleeding. Right Breast: Diffuse tenderness to light palpation of the right breast.  The right breast is significantly swollen centrally with a dominant thickening extending over 7 to 8 cm. Hyperpigmentation and dry desquamation within the treatment field. No signs of infection or delayed wound healing or moist desquamation.  Lab Findings: Lab Results  Component Value Date   WBC 11.8 (H) 02/16/2024   HGB 14.0 02/16/2024   HCT 40.4 02/16/2024   MCV 85.8 02/16/2024   PLT 167 02/16/2024    Radiographic Findings: CT Chest W Contrast Result Date: 02/16/2024 CLINICAL DATA:  Right-sided breast pain, swelling, erythema. Fever. History of breast cancer. Right-sided lumpectomy and sentinel lymph node biopsy 11/28/2023. Radiation therapy 01/27/2024. * Tracking Code: BO * EXAM: CT CHEST WITH CONTRAST  TECHNIQUE: Multidetector CT imaging of the chest was performed during intravenous contrast administration. RADIATION DOSE REDUCTION: This exam was performed according to the departmental dose-optimization program which includes automated exposure control, adjustment of the mA and/or kV according to patient size and/or use of iterative reconstruction technique. CONTRAST:  80mL OMNIPAQUE  IOHEXOL  300 MG/ML  SOLN COMPARISON:  04/21/2018 chest CT FINDINGS: Cardiovascular: Aortic atherosclerosis. Bovine arch. Mild cardiomegaly, without pericardial effusion. Lad coronary artery calcification. No central pulmonary embolism, on this non-dedicated study. Mediastinum/Nodes: No supraclavicular adenopathy. Right axillary node dissection. No axillary adenopathy. No mediastinal or hilar adenopathy. Lungs/Pleura: No pleural fluid. Dependent bibasilar subsegmental atelectasis. Upper Abdomen: A too small to characterize segment 4A low-density lesion of 6 mm on 82/301. Focal steatosis adjacent the falciform ligament. Normal imaged portions of the spleen, stomach, pancreas, gallbladder, adrenal glands, right kidney. Too small to characterize upper pole left renal lesion is most likely a cyst . In the absence of clinically indicated signs/symptoms require(s) no independent follow-up. Musculoskeletal: Surgical clips within the lateral right breast. A right breast fluid collection measures 8.4 x 4.6 cm on 55/301. Demonstrates mild wall thickening, without intracollection gas. Diffused breast parenchymal interstitial and skin thickening. Contiguous or adjacent right axillary fluid collection measures 11 x 18 mm on 52/301. No acute osseous abnormality. IMPRESSION: 1. Thick wall fluid collection in the right breast with surrounding parenchymal edema and skin thickening. This fluid collection could represent seroma or hematoma. Wall thickening could be related to superinfection or radiation change. Similarly, parenchymal and skin thickening  are most likely radiation induced. Cannot exclude superimposed mastitis. 2. Contiguous or adjacent diminutive right axillary fluid collection is most likely a seroma. 3. Segment 4A too small to characterize hepatic lesion is present on 07/03/2021 and considered benign. No evidence of metastatic disease in the chest. 4. Age advanced coronary artery atherosclerosis. Recommend assessment of coronary risk factors. 5.  Aortic Atherosclerosis (ICD10-I70.0). Electronically Signed   By: Rockey Kilts M.D.   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 She is unfortunately experiencing a lot of pain secondary to this. She is currently being scheduled for ultrasound-guided aspiration of the deep chest wall seroma. Recommend finishing prescribed antibiotics and using Tramadol  PRN for pain. We will see the patient in 1 month for follow-up.    Hypotension  Patient's BP is  80/57 mmhg today despite reported proper hydration. She is taking her BP medications as prescribed. Recommend patient checks her BP daily at the same time. If readings are consistently low, recommend patient follows up with her PCP/cardiologist for possible medication adjustment. She expressed understanding and is in agreement with this plan.     ___________________________________  Lynwood CHARM Nasuti, PhD, MD  This document serves as a record of services personally performed by Lynwood Nasuti, MD. It was created on his behalf by Dorthy Fuse, a trained medical scribe. The creation of this record is based on the scribe's personal observations and the provider's statements to them. This document has been checked and approved by the attending provider.

## 2024-02-23 NOTE — Progress Notes (Signed)
 PROVIDER:  DONNICE DEWAYNE LIMA, MD  MRN: [id number] DOB: 1956/04/24 DATE OF ENCOUNTER: 02/23/2024 Interval History:   Breast [facility] 11/19/23 [place] [place]     This is a 68 year old female with no family history of breast cancer who presents after routine screening mammogram revealed a mass in the right breast.  She is found to have a 0.9 x 0.8 cm mass located at 10:00 in the right breast, 8 cm from the nipple.  Axillary ultrasound was negative.  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  No previous breast surgery.  The patient has an adopted daughter who recently underwent bilateral mastectomies preceded by neoadjuvant chemotherapy.  She presents now with her husband to discuss surgical options.   On 11/23/2023, she underwent right breast magnetic seed localized lumpectomy and deep sentinel lymph node biopsy.  We performed both procedures through the same axillary incision.  Margins were negative.  Both sentinel lymph nodes were negative.  The patient called the office today because she is having increased swelling in her axilla and lateral breast causing pain and limited range of motion of the shoulder.  We worked her in the office on 12/08/2023 for what appears to be a seroma.  We aspirated 160 cc of serosanguineous seroma fluid.  On 12/11/2023, 30 cc were aspirated.  On 5/19, only 10 cc were aspirated.   The patient recently completed radiation therapy.  She has been feeling well and was fairly active with her normal physical activity as well as physical therapy.  For the last 3 days, the patient has developed exquisite sensitivity to the right breast and overall feeling of malaise.  She has some low-grade subjective fevers.  Her white blood cell count yesterday was mildly elevated at 11.8.  The PA from oncology ordered a CT scan of the chest.  This showed a fluid collection deep in the right breast.  There is some overlying skin thickening.  No  sign of wall enhancement.  She had a very small adjacent axillary fluid collection 1.8 cm.  The PA from oncology also ordered Keflex .  The patient has been on this for 1 week with no significant improvement.  She called their office again trying to reach either Dr. Loretha or Dr. Shannon.  Another nurse called in another prescription for Levaquin .  The patient is unclear why she is on 2 antibiotics.    We saw the patient last week.  There was no sign of infection.  We aspirated 60 cc of clear seroma fluid but the seroma seems to be at the deepest extent of the available needles in our office.  She has been afebrile over the weekend.  The tramadol  is providing some temporary relief.  The seroma seems to have reaccumulated since aspirating it last week.  Physical Examination:   Physical Exam   The right breast shows some desquamation around the axillary incision.  There is no thickening of the skin of the breast but she does have the expected radiation changes.  The skin is still sensitive but less sensitive than last week.  The seroma seems to have reaccumulated in the upper outer quadrant.   Assessment and Plan:   [name] is a 68 y.o. female who underwent right breast lumpectomy and sentinel lymph node biopsy on 11/23/2023.  Diagnoses and all orders for this visit:  Invasive ductal carcinoma of breast, female, right (CMS/HHS-HCC)  Radiation dermatitis  Postoperative seroma  We will ask  [facility] to perform ultrasound-guided aspiration of the deep chest wall seroma.  I believe that the pressure from the seroma is exacerbated by the sensitivity of her skin from the radiation dermatitis.  There is no sign of cellulitis or wound infection.  A wound infection 3 months after surgery would be very rare.  No indications for antibiotics.  I told the patient to stop her current antibiotics.  She needs to be seen by radiation oncology to discuss the skin care due to the radiation dermatitis.  Return  in about 1 week (around 03/01/2024).   The plan was discussed in detail with the patient today, who expressed understanding.  The patient has my contact information, and understands to call me with any additional questions or concerns in the interval.  I would be happy to see the patient back sooner if the need arises.   MATTHEW KAI TSUEI, MD

## 2024-02-23 NOTE — Progress Notes (Signed)
 Shany Marinez is here today for follow up post radiation to the breast.   Breast Side:Right Breast   They completed their radiation on: 01/28/2024  Does the patient complain of any of the following: Post radiation skin issues: She has significant skin discoloration and patches of desquamation near the axilla.  Breast Tenderness: Yes, 7/10 to touch. Breast Swelling: Yes Lymphadema: Yes Range of Motion limitations: Yes, she states that she cannot lift arm. Fatigue post radiation: Moderate Appetite good/fair/poor:   Additional comments if applicable: BP (!) 80/57 (BP Location: Left Arm, Patient Position: Sitting, Cuff Size: Large)   Pulse 84   Temp 97.7 F (36.5 C)   Resp 20   Ht 5' 3 (1.6 m)   Wt 193 lb 6.4 oz (87.7 kg)   SpO2 99%   BMI 34.26 kg/m  Rechecked manually 64/42 MD notified

## 2024-02-25 ENCOUNTER — Other Ambulatory Visit: Payer: Self-pay | Admitting: Surgery

## 2024-02-25 ENCOUNTER — Ambulatory Visit

## 2024-02-25 DIAGNOSIS — Z170 Estrogen receptor positive status [ER+]: Secondary | ICD-10-CM

## 2024-02-26 ENCOUNTER — Ambulatory Visit: Admitting: Radiation Oncology

## 2024-03-01 ENCOUNTER — Ambulatory Visit: Payer: Self-pay | Admitting: Surgery

## 2024-03-01 ENCOUNTER — Ambulatory Visit: Payer: Self-pay

## 2024-03-01 NOTE — H&P (Signed)
 Subjective   Chief Complaint: Wound recheck     History of Present Illness: [name] is a 68 y.o. female who is seen today for wound recheck  Breast MDC 11/19/23 [place]/ [place]     This is a 68 year old female with no family history of breast cancer who presents after routine screening mammogram revealed a mass in the right breast.  She is found to have a 0.9 x 0.8 cm mass located at 10:00 in the right breast, 8 cm from the nipple.  Axillary ultrasound was negative.  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  No previous breast surgery.  The patient has an adopted daughter who recently underwent bilateral mastectomies preceded by neoadjuvant chemotherapy.  She presents now with her husband to discuss surgical options.   On 11/23/2023, she underwent right breast magnetic seed localized lumpectomy and deep sentinel lymph node biopsy.  We performed both procedures through the same axillary incision.  Margins were negative.  Both sentinel lymph nodes were negative.  The patient called the office today because she is having increased swelling in her axilla and lateral breast causing pain and limited range of motion of the shoulder.  We worked her in the office on 12/08/2023 for what appears to be a seroma.  We aspirated 160 cc of serosanguineous seroma fluid.  On 12/11/2023, 30 cc were aspirated.  On 5/19, only 10 cc were aspirated.   The patient recently completed radiation therapy.  She has been feeling well and was fairly active with her normal physical activity as well as physical therapy.  For the last 3 days, the patient has developed exquisite sensitivity to the right breast and overall feeling of malaise.  She has some low-grade subjective fevers.  Her white blood cell count yesterday was mildly elevated at 11.8.  The PA from oncology ordered a CT scan of the chest.  This showed a fluid collection deep in the right breast.  There  is some overlying skin thickening.  No sign of wall enhancement.  She had a very small adjacent axillary fluid collection 1.8 cm.   The PA from oncology also ordered Keflex .  The patient has been on this for 1 week with no significant improvement.  She called their office again trying to reach either Dr. Loretha or Dr. Shannon.  Another nurse called in another prescription for Levaquin .  The patient is unclear why she is on 2 antibiotics.  I instructed her to stop her antibiotics   We saw the patient 02/18/24.  There was no sign of infection.  We aspirated 60 cc of clear seroma fluid but the seroma seems to be at the deepest extent of the available needles in our office.  At her recheck last week, the seroma seems to have reaccumulated.  Her skin showed some sign of improvement from the radiation dermatitis.  We referred her to [facility] where they were able to aspirate the seroma under ultrasound guidance.  150 cc was evacuated.  The patient felt much better.  However, the seroma has reaccumulated over the weekend.  Her skin is feeling much better.    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   Right foot pain   Metatarsalgia of right foot   Compression of nerve   Primary osteoarthritis of first carpometacarpal joint of left hand   Pain from implanted hardware   Radiculopathy of lumbar region   Spondylosis without myelopathy or radiculopathy, cervical region   Spondylolisthesis of lumbar region   EIN (endometrial intraepithelial neoplasia)   Stroke (CMS/HHS-HCC)   Asthma without status  asthmaticus   Invasive ductal carcinoma of breast, female, right (CMS/HHS-HCC)   Malignant neoplasm of upper-outer quadrant of right breast in female, estrogen receptor positive (CMS/HHS-HCC)   Essential hypertension   Seroma of breast   Radiation dermatitis    Past Surgical History:  Procedure Laterality Date   OSTEOTOMY METATARSAL Right 04/04/2015   Procedure: OSTEOTOMY, WITH OR WITHOUT LENGTHENING, SHORTENING OR ANGULAR CORRECTION, METATARSAL; OTHER THAN FIRST METATARSAL, EACH- 2nd metatarsal Weil ;  Surgeon: Lynwood DELENA Buerger, MD;  Location: [facility] OR;  Service: Orthopedics;  Laterality: Right;   NEUROPLASTY NERVE HAND/FOOT Right 04/04/2015   Procedure: NEUROPLASTY NERVE FOOT;  Surgeon: Lynwood DELENA Buerger, MD;  Location: [facility] OR;  Service: Orthopedics;  Laterality: Right;   TRANSPLANT/TRANSFER TENDON FOREARM &/WRIST Left 07/27/2015   Procedure: TENDON TRANSPLANTATION OR TRANSFER, FLEXOR OR EXTENSOR, FOREARM AND/OR WRIST, SINGLE; EACH TENDON;  Surgeon: Alm Sallye Bickers, MD;  Location: [facility] OR;  Service: Orthopedics;  Laterality: Left;   ARTHROPLASTY CARPOMETACARPAL/INTERCARPAL JOINTS Left 07/27/2015   Procedure: ARTHROPLASTY, INTERPOSITION, INTERCARPAL OR CARPOMETACARPAL JOINTS;  Surgeon: Alm Sallye Bickers, MD;  Location: [facility] OR;  Service: Orthopedics;  Laterality: Left;   LAPAROSCOPIC HYSTERECTOMY TOTAL W/REMOVAL TUBES &/OR OVARIES Bilateral 06/26/2021   Procedure: LAPAROSCOPY, SURGICAL; WITH TOTAL HYSTERECTOMY, FOR UTERUS 250 G OR LESS; WITH REMOVAL OF BILATERAL TUBES  AND OVARIES;  Surgeon: Mancil Barter, MD;  Location: [facility] OR;  Service: Gynecology;  Laterality: Bilateral;   PELVIC EXAMINATION UNDER ANESTHESIA N/A 06/26/2021   Procedure: PELVIC EXAMINATION UNDER ANESTHESIA (OTHER THAN LOCAL);  Surgeon: Mancil Barter, MD;  Location: [facility] OR;  Service: Gynecology;  Laterality: N/A;   INJECTION FOR IDENTIFICATION SENTINEL NODE GROIN N/A 06/26/2021   Procedure: INJECTION PROCEDURE;  RADIOACTIVE TRACER FOR IDENTIFICATION OF SENTINEL NODE GROIN;  Surgeon: Mancil Barter, MD;  Location: [facility] OR;  Service: Gynecology;  Laterality: N/A;    RT BR MSL(SOLIS) LUMP w/AX SLNB  09/29/2023   Dr. Belinda   APPENDECTOMY     ARTHROSCOPY SHOULDER     BREAST SURGERY  1988- augementation   implants removed in 2002?   bunionectomy  Bilateral    CESAREAN SECTION     DILATION AND CURETTAGE OF UTERUS  2022   ENDOMETRIAL ABLATION  2022   FRACTURE SURGERY     Ankle   ganglian cyst     hand   RHINOPLASTY     shoulder tendon repair     TUBAL LIGATION       Allergies  Allergen Reactions   Doxycycline  Diarrhea and Nausea    Nausea and diarrhea started a few hours after taking medication.  Diarrhea and nausea continue 2 days after discontinuation.   Penicillins Shortness Of Breath    REACTION: TONGUE SWELLING   Promethazine Hcl Shortness Of Breath    Tongue swelling    Clarithromycin Nausea And Vomiting   Levofloxacin  Nausea And Vomiting  Statins-Hmg-Coa Reductase Inhibitors Other (See Comments)    LEG CRAMPS   Hydrocodone  Bitartrate Itching and Anxiety   Kiwi (Actinidia Chinensis) Swelling    Current Outpatient Medications on File Prior to Visit  Medication Sig Dispense Refill   albuterol  (PROVENTIL ) 2.5 mg /3 mL (0.083 %) nebulizer solution Inhale 2.5 mg into the lungs every 6 (six) hours as needed     albuterol  90 mcg/actuation inhaler Inhale 2 inhalations into the lungs every 6 (six) hours as needed for Wheezing. Reported on 09/13/2015      albuterol  MDI, PROVENTIL , VENTOLIN , PROAIR , HFA 90 mcg/actuation inhaler Inhale 2 inhalations into the lungs every 4 (four) hours as needed     alirocumab  (PRALUENT  PEN) 150 mg/mL PnIj Praluent  Pen 150 mg/mL subcutaneous pen injector     cetirizine  (ZYRTEC ) 10 MG tablet Take 10 mg by mouth at bedtime     ezetimibe  (ZETIA ) 10 mg tablet      montelukast  (SINGULAIR ) 10 mg tablet Take by mouth     REPATHA  SURECLICK 140 mg/mL PnIj       ZEPBOUND  10 mg/0.5 mL pen injector Inject 10 mg subcutaneously once a week     celecoxib  (CELEBREX ) 200 MG capsule Take 200 mg by mouth 2 (two) times daily.     cholecalciferol (VITAMIN D3) 2,000 unit capsule Take 2,000 Units by mouth once daily.     citalopram  (CELEXA ) 10 MG tablet Take 1 tablet (10 mg total) by mouth once daily 30 tablet 11   ergocalciferol , vitamin D2, 1,250 mcg (50,000 unit) capsule Take 1 capsule (50,000 Units total) by mouth every 7 (seven) days     losartan  (COZAAR ) 25 MG tablet 1 tablet (25 mg total) once daily (Patient not taking: Reported on 03/01/2024)     oxybutynin (DITROPAN-XL) 5 MG XL tablet Take 1 tablet (5 mg total) by mouth once daily 30 tablet 1   sodium, potassium, and magnesium (SUPREP) oral solution Take by mouth. Reported on 09/13/2015      No current facility-administered medications on file prior to visit.    Family History  Problem Relation Age of Onset   Skin cancer Mother    Obesity Mother    High blood pressure (Hypertension) Mother    Hyperlipidemia (Elevated cholesterol) Mother    Diabetes type I Mother    Lung cancer Mother    Cancer Mother        Lung   COPD Father    Heart disease Father    Stroke Father    Prostate cancer Father    Coronary Artery Disease (Blocked arteries around heart) Father    High blood pressure (Hypertension) Father    Cancer Father        lung   Diabetes Sister    Deep vein thrombosis (DVT or abnormal blood clot formation) Sister    Anesthesia problems Neg Hx      Social History   Tobacco Use  Smoking Status Former   Current packs/day: 0.00   Average packs/day: 2.0 packs/day for 31.0 years (62.0 ttl pk-yrs)   Types: Cigarettes   Start date: 03/05/1976   Quit date: 08/01/2000   Years since quitting: 23.5  Smokeless Tobacco Never     Social History   Socioeconomic History   Marital status: Married  Tobacco Use   Smoking status: Former    Current packs/day: 0.00    Average packs/day: 2.0  packs/day for 31.0 years (62.0 ttl pk-yrs)    Types: Cigarettes    Start date: 03/05/1976  Quit date: 08/01/2000    Years since quitting: 23.5   Smokeless tobacco: Never  Vaping Use   Vaping status: Never Used  Substance and Sexual Activity   Alcohol use: Yes    Alcohol/week: 1.0 standard drink of alcohol    Types: 1 Glasses of wine per week    Comment: per month   Drug use: No   Sexual activity: Not Currently    Partners: Male    Comment: not in 12 years  Other Topics Concern   Would you please tell us  about the people who live in your home, your pets, or anything else important to your social life? Yes    Comment: husband and dog   Social Drivers of Health   Food Insecurity: No Food Insecurity (11/19/2023)   Received from [facility]   Hunger Vital Sign    Within the past 12 months, you worried that your food would run out before you got the money to buy more.: Never true    Within the past 12 months, the food you bought just didn't last and you didn't have money to get more.: Never true  Transportation Needs: No Transportation Needs (11/19/2023)   Received from [facility] - Transportation    Lack of Transportation (Medical): No    Lack of Transportation (Non-Medical): No   Received from [facility]   Social Network  Housing Stability: Unknown (12/08/2023)   Housing Stability Vital Sign    Homeless in the Last Year: No    Objective:    Vitals:   03/01/24 0836  PainSc:   6  PainLoc: Breast      Physical Exam   Constitutional:  WDWN in NAD, conversant, no obvious deformities; lying in bed comfortably Eyes:  Pupils equal, round; sclera anicteric; moist conjunctiva; no lid lag HENT:  Oral mucosa moist; good dentition  Neck:  No masses palpated, trachea midline; no thyromegaly Lungs:  CTA bilaterally; normal respiratory effort Breasts: The right axillary incision is healing well.  The surrounding skin is much improved with decreased sensitivity.  The  desquamation has resolved.  The skin is much less tender to palpation. She has reaccumulated the chest wall seroma in the upper breast. CV:  Regular rate and rhythm; no murmurs; extremities well-perfused with no edema Abd:  +bowel sounds, soft, non-tender, no palpable organomegaly; no palpable hernias Musc:  Unable to assess gait; no apparent clubbing or cyanosis in extremities Lymphatic:  No palpable cervical or axillary lymphadenopathy Skin:  Warm, dry; no sign of jaundice Psychiatric - alert and oriented x 4; calm mood and affect    Assessment and Plan:  Diagnoses and all orders for this visit:  Invasive ductal carcinoma of breast, female, right (CMS/HHS-HCC)  Radiation dermatitis  Seroma of breast    The patient has reaccumulated a fairly large seroma deep in the right breast up against the chest wall.  This is likely exacerbated by the recent radiation.  The radiation dermatitis is much improved.  We discussed the options for managing the seroma since it has reaccumulated so quickly and seems fairly large.  We discussed repeating the ultrasound-guided aspiration but this would likely require several occurrences.  We discussed asking radiology to place a percutaneous drainage catheter.  We also discussed returning to the operating room for wound exploration.  We would debride some of the wall of the seroma cavity to promote healing.  We would then place a Jackson-Pratt drain.  The third approach is obviously the  most aggressive but would probably be the most effective.  The patient and her husband are in agreement with proceeding with this plan.  We will try to schedule this as soon as possible.  Ashey Tramontana DEWAYNE LIMA, MD  03/01/2024 10:08 AM

## 2024-03-01 NOTE — H&P (View-Only) (Signed)
 Subjective   Chief Complaint: Wound recheck     History of Present Illness: [name] is a 68 y.o. female who is seen today for wound recheck  Breast MDC 11/19/23 [place]/ [place]     This is a 68 year old female with no family history of breast cancer who presents after routine screening mammogram revealed a mass in the right breast.  She is found to have a 0.9 x 0.8 cm mass located at 10:00 in the right breast, 8 cm from the nipple.  Axillary ultrasound was negative.  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  No previous breast surgery.  The patient has an adopted daughter who recently underwent bilateral mastectomies preceded by neoadjuvant chemotherapy.  She presents now with her husband to discuss surgical options.   On 11/23/2023, she underwent right breast magnetic seed localized lumpectomy and deep sentinel lymph node biopsy.  We performed both procedures through the same axillary incision.  Margins were negative.  Both sentinel lymph nodes were negative.  The patient called the office today because she is having increased swelling in her axilla and lateral breast causing pain and limited range of motion of the shoulder.  We worked her in the office on 12/08/2023 for what appears to be a seroma.  We aspirated 160 cc of serosanguineous seroma fluid.  On 12/11/2023, 30 cc were aspirated.  On 5/19, only 10 cc were aspirated.   The patient recently completed radiation therapy.  She has been feeling well and was fairly active with her normal physical activity as well as physical therapy.  For the last 3 days, the patient has developed exquisite sensitivity to the right breast and overall feeling of malaise.  She has some low-grade subjective fevers.  Her white blood cell count yesterday was mildly elevated at 11.8.  The PA from oncology ordered a CT scan of the chest.  This showed a fluid collection deep in the right breast.  There  is some overlying skin thickening.  No sign of wall enhancement.  She had a very small adjacent axillary fluid collection 1.8 cm.   The PA from oncology also ordered Keflex .  The patient has been on this for 1 week with no significant improvement.  She called their office again trying to reach either Dr. Loretha or Dr. Shannon.  Another nurse called in another prescription for Levaquin .  The patient is unclear why she is on 2 antibiotics.  I instructed her to stop her antibiotics   We saw the patient 02/18/24.  There was no sign of infection.  We aspirated 60 cc of clear seroma fluid but the seroma seems to be at the deepest extent of the available needles in our office.  At her recheck last week, the seroma seems to have reaccumulated.  Her skin showed some sign of improvement from the radiation dermatitis.  We referred her to [facility] where they were able to aspirate the seroma under ultrasound guidance.  150 cc was evacuated.  The patient felt much better.  However, the seroma has reaccumulated over the weekend.  Her skin is feeling much better.    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   Right foot pain   Metatarsalgia of right foot   Compression of nerve   Primary osteoarthritis of first carpometacarpal joint of left hand   Pain from implanted hardware   Radiculopathy of lumbar region   Spondylosis without myelopathy or radiculopathy, cervical region   Spondylolisthesis of lumbar region   EIN (endometrial intraepithelial neoplasia)   Stroke (CMS/HHS-HCC)   Asthma without status  asthmaticus   Invasive ductal carcinoma of breast, female, right (CMS/HHS-HCC)   Malignant neoplasm of upper-outer quadrant of right breast in female, estrogen receptor positive (CMS/HHS-HCC)   Essential hypertension   Seroma of breast   Radiation dermatitis    Past Surgical History:  Procedure Laterality Date   OSTEOTOMY METATARSAL Right 04/04/2015   Procedure: OSTEOTOMY, WITH OR WITHOUT LENGTHENING, SHORTENING OR ANGULAR CORRECTION, METATARSAL; OTHER THAN FIRST METATARSAL, EACH- 2nd metatarsal Weil ;  Surgeon: Lynwood DELENA Buerger, MD;  Location: [facility] OR;  Service: Orthopedics;  Laterality: Right;   NEUROPLASTY NERVE HAND/FOOT Right 04/04/2015   Procedure: NEUROPLASTY NERVE FOOT;  Surgeon: Lynwood DELENA Buerger, MD;  Location: [facility] OR;  Service: Orthopedics;  Laterality: Right;   TRANSPLANT/TRANSFER TENDON FOREARM &/WRIST Left 07/27/2015   Procedure: TENDON TRANSPLANTATION OR TRANSFER, FLEXOR OR EXTENSOR, FOREARM AND/OR WRIST, SINGLE; EACH TENDON;  Surgeon: Alm Sallye Bickers, MD;  Location: [facility] OR;  Service: Orthopedics;  Laterality: Left;   ARTHROPLASTY CARPOMETACARPAL/INTERCARPAL JOINTS Left 07/27/2015   Procedure: ARTHROPLASTY, INTERPOSITION, INTERCARPAL OR CARPOMETACARPAL JOINTS;  Surgeon: Alm Sallye Bickers, MD;  Location: [facility] OR;  Service: Orthopedics;  Laterality: Left;   LAPAROSCOPIC HYSTERECTOMY TOTAL W/REMOVAL TUBES &/OR OVARIES Bilateral 06/26/2021   Procedure: LAPAROSCOPY, SURGICAL; WITH TOTAL HYSTERECTOMY, FOR UTERUS 250 G OR LESS; WITH REMOVAL OF BILATERAL TUBES  AND OVARIES;  Surgeon: Mancil Barter, MD;  Location: [facility] OR;  Service: Gynecology;  Laterality: Bilateral;   PELVIC EXAMINATION UNDER ANESTHESIA N/A 06/26/2021   Procedure: PELVIC EXAMINATION UNDER ANESTHESIA (OTHER THAN LOCAL);  Surgeon: Mancil Barter, MD;  Location: [facility] OR;  Service: Gynecology;  Laterality: N/A;   INJECTION FOR IDENTIFICATION SENTINEL NODE GROIN N/A 06/26/2021   Procedure: INJECTION PROCEDURE;  RADIOACTIVE TRACER FOR IDENTIFICATION OF SENTINEL NODE GROIN;  Surgeon: Mancil Barter, MD;  Location: [facility] OR;  Service: Gynecology;  Laterality: N/A;    RT BR MSL(SOLIS) LUMP w/AX SLNB  09/29/2023   Dr. Belinda   APPENDECTOMY     ARTHROSCOPY SHOULDER     BREAST SURGERY  1988- augementation   implants removed in 2002?   bunionectomy  Bilateral    CESAREAN SECTION     DILATION AND CURETTAGE OF UTERUS  2022   ENDOMETRIAL ABLATION  2022   FRACTURE SURGERY     Ankle   ganglian cyst     hand   RHINOPLASTY     shoulder tendon repair     TUBAL LIGATION       Allergies  Allergen Reactions   Doxycycline  Diarrhea and Nausea    Nausea and diarrhea started a few hours after taking medication.  Diarrhea and nausea continue 2 days after discontinuation.   Penicillins Shortness Of Breath    REACTION: TONGUE SWELLING   Promethazine Hcl Shortness Of Breath    Tongue swelling    Clarithromycin Nausea And Vomiting   Levofloxacin  Nausea And Vomiting  Statins-Hmg-Coa Reductase Inhibitors Other (See Comments)    LEG CRAMPS   Hydrocodone  Bitartrate Itching and Anxiety   Kiwi (Actinidia Chinensis) Swelling    Current Outpatient Medications on File Prior to Visit  Medication Sig Dispense Refill   albuterol  (PROVENTIL ) 2.5 mg /3 mL (0.083 %) nebulizer solution Inhale 2.5 mg into the lungs every 6 (six) hours as needed     albuterol  90 mcg/actuation inhaler Inhale 2 inhalations into the lungs every 6 (six) hours as needed for Wheezing. Reported on 09/13/2015      albuterol  MDI, PROVENTIL , VENTOLIN , PROAIR , HFA 90 mcg/actuation inhaler Inhale 2 inhalations into the lungs every 4 (four) hours as needed     alirocumab  (PRALUENT  PEN) 150 mg/mL PnIj Praluent  Pen 150 mg/mL subcutaneous pen injector     cetirizine  (ZYRTEC ) 10 MG tablet Take 10 mg by mouth at bedtime     ezetimibe  (ZETIA ) 10 mg tablet      montelukast  (SINGULAIR ) 10 mg tablet Take by mouth     REPATHA  SURECLICK 140 mg/mL PnIj       ZEPBOUND  10 mg/0.5 mL pen injector Inject 10 mg subcutaneously once a week     celecoxib  (CELEBREX ) 200 MG capsule Take 200 mg by mouth 2 (two) times daily.     cholecalciferol (VITAMIN D3) 2,000 unit capsule Take 2,000 Units by mouth once daily.     citalopram  (CELEXA ) 10 MG tablet Take 1 tablet (10 mg total) by mouth once daily 30 tablet 11   ergocalciferol , vitamin D2, 1,250 mcg (50,000 unit) capsule Take 1 capsule (50,000 Units total) by mouth every 7 (seven) days     losartan  (COZAAR ) 25 MG tablet 1 tablet (25 mg total) once daily (Patient not taking: Reported on 03/01/2024)     oxybutynin (DITROPAN-XL) 5 MG XL tablet Take 1 tablet (5 mg total) by mouth once daily 30 tablet 1   sodium, potassium, and magnesium (SUPREP) oral solution Take by mouth. Reported on 09/13/2015      No current facility-administered medications on file prior to visit.    Family History  Problem Relation Age of Onset   Skin cancer Mother    Obesity Mother    High blood pressure (Hypertension) Mother    Hyperlipidemia (Elevated cholesterol) Mother    Diabetes type I Mother    Lung cancer Mother    Cancer Mother        Lung   COPD Father    Heart disease Father    Stroke Father    Prostate cancer Father    Coronary Artery Disease (Blocked arteries around heart) Father    High blood pressure (Hypertension) Father    Cancer Father        lung   Diabetes Sister    Deep vein thrombosis (DVT or abnormal blood clot formation) Sister    Anesthesia problems Neg Hx      Social History   Tobacco Use  Smoking Status Former   Current packs/day: 0.00   Average packs/day: 2.0 packs/day for 31.0 years (62.0 ttl pk-yrs)   Types: Cigarettes   Start date: 03/05/1976   Quit date: 08/01/2000   Years since quitting: 23.5  Smokeless Tobacco Never     Social History   Socioeconomic History   Marital status: Married  Tobacco Use   Smoking status: Former    Current packs/day: 0.00    Average packs/day: 2.0  packs/day for 31.0 years (62.0 ttl pk-yrs)    Types: Cigarettes    Start date: 03/05/1976  Quit date: 08/01/2000    Years since quitting: 23.5   Smokeless tobacco: Never  Vaping Use   Vaping status: Never Used  Substance and Sexual Activity   Alcohol use: Yes    Alcohol/week: 1.0 standard drink of alcohol    Types: 1 Glasses of wine per week    Comment: per month   Drug use: No   Sexual activity: Not Currently    Partners: Male    Comment: not in 12 years  Other Topics Concern   Would you please tell us  about the people who live in your home, your pets, or anything else important to your social life? Yes    Comment: husband and dog   Social Drivers of Health   Food Insecurity: No Food Insecurity (11/19/2023)   Received from [facility]   Hunger Vital Sign    Within the past 12 months, you worried that your food would run out before you got the money to buy more.: Never true    Within the past 12 months, the food you bought just didn't last and you didn't have money to get more.: Never true  Transportation Needs: No Transportation Needs (11/19/2023)   Received from [facility] - Transportation    Lack of Transportation (Medical): No    Lack of Transportation (Non-Medical): No   Received from [facility]   Social Network  Housing Stability: Unknown (12/08/2023)   Housing Stability Vital Sign    Homeless in the Last Year: No    Objective:    Vitals:   03/01/24 0836  PainSc:   6  PainLoc: Breast      Physical Exam   Constitutional:  WDWN in NAD, conversant, no obvious deformities; lying in bed comfortably Eyes:  Pupils equal, round; sclera anicteric; moist conjunctiva; no lid lag HENT:  Oral mucosa moist; good dentition  Neck:  No masses palpated, trachea midline; no thyromegaly Lungs:  CTA bilaterally; normal respiratory effort Breasts: The right axillary incision is healing well.  The surrounding skin is much improved with decreased sensitivity.  The  desquamation has resolved.  The skin is much less tender to palpation. She has reaccumulated the chest wall seroma in the upper breast. CV:  Regular rate and rhythm; no murmurs; extremities well-perfused with no edema Abd:  +bowel sounds, soft, non-tender, no palpable organomegaly; no palpable hernias Musc:  Unable to assess gait; no apparent clubbing or cyanosis in extremities Lymphatic:  No palpable cervical or axillary lymphadenopathy Skin:  Warm, dry; no sign of jaundice Psychiatric - alert and oriented x 4; calm mood and affect    Assessment and Plan:  Diagnoses and all orders for this visit:  Invasive ductal carcinoma of breast, female, right (CMS/HHS-HCC)  Radiation dermatitis  Seroma of breast    The patient has reaccumulated a fairly large seroma deep in the right breast up against the chest wall.  This is likely exacerbated by the recent radiation.  The radiation dermatitis is much improved.  We discussed the options for managing the seroma since it has reaccumulated so quickly and seems fairly large.  We discussed repeating the ultrasound-guided aspiration but this would likely require several occurrences.  We discussed asking radiology to place a percutaneous drainage catheter.  We also discussed returning to the operating room for wound exploration.  We would debride some of the wall of the seroma cavity to promote healing.  We would then place a Jackson-Pratt drain.  The third approach is obviously the  most aggressive but would probably be the most effective.  The patient and her husband are in agreement with proceeding with this plan.  We will try to schedule this as soon as possible.  Ashey Tramontana DEWAYNE LIMA, MD  03/01/2024 10:08 AM

## 2024-03-01 NOTE — Telephone Encounter (Signed)
 FYI Only or Action Required?: Action required by provider: update on patient condition.  Patient was last seen in primary care on 03/24/2023 by Rollene Almarie LABOR, MD.  Called Nurse Triage reporting Fatigue and Hypotension.  Symptoms began a week ago.  Interventions attempted: Other: stopped losartan -hydrochlorothiazide  and ezetimbe.  Symptoms are: fatigue, severe breast pain, hypotension (82/60) unchanged.  Triage Disposition: Go to ED Now (or PCP Triage)  Patient/caregiver understands and will follow disposition?: No           Copied from CRM [id number]. Topic: Clinical - Red Word Triage >> Mar 01, 2024 10:10 AM [name] wrote: Red Word that prompted transfer to Nurse Triage: Pt is experiencing extreme fatigue. Last Bp was 82/60 Reason for Disposition  [1] Fall in systolic BP > 20 mm Hg from normal AND [2] feeling weak or lightheaded  Answer Assessment - Initial Assessment Questions Patient states she stopped her BP medications (losartan , also mentions she stopped ezetimbe) on July 21st. She states she was at the cancer center on 02/23/24 and her BP was 68/45 and the highest reading they could get was 80/57. She is supposed to have surgery due to breast cancer, she states she has been through radiation as well. Patient states she would prefer to not come in for a visit with her PCP and if needed she can do a video visit but would prefer to just send a message to her PCP. Patient states she is okay with a call back or mychart message. Advised that her BP with recent infection and cancer, feeling fatigued could be sepsis and advised ED. She states she would just like a message sent to Dr Rollene and if she needs to come in for a visit she will, otherwise she will wait for her appt 03/2024. Advised patient to notify her oncologist as well of hypotension with recent treatment for cellulitis. Patient difficult caller/upset with RN advice of ED and calling oncologist. Called [facility] and  notified Erin of ED refusal.   1. BLOOD PRESSURE: What is your blood pressure? Did you take at least two measurements 5 minutes apart?     82/60.  2. ONSET: When did you take your blood pressure?     This morning at 0745.  3. HOW: How did you take your blood pressure? (e.g., visiting nurse, automatic home BP monitor)     Automatic home BP monitor.  4. HISTORY: Do you have a history of low blood pressure? What is your blood pressure normally?     History of hypertension, has been low since 02/23/24. Her normal BP was 100/80 at last office visit.  5. MEDICINES: Are you taking any medicines for blood pressure? If Yes, ask: Have they been changed recently?     Stopped losartan -hydrochlorothiazide  on 02/23/24.  6. PULSE RATE: Do you know what your pulse rate is?      70-80s.  7. OTHER SYMPTOMS: Have you been sick recently? Have you had a recent injury?     Patient is sick with breast cancer but states she has had it removed. She states she has had an infection in her breast recently. Patient states she has been experiencing fatigue but states she has felt that way since receiving radiation. Patient denies chest pain, SOB. She states her last fever was 2 weeks ago with the breast infection and was treated with Keflex  and Levaquin . Severe breast pain.  8. PREGNANCY: Is there any chance you are pregnant? When was your last menstrual period?  N/A.  Protocols used: Blood Pressure - Low-A-AH

## 2024-03-02 NOTE — Telephone Encounter (Signed)
**Note De-identified  [place] Obfuscation** Please advise 

## 2024-03-02 NOTE — Telephone Encounter (Signed)
 Ok with stopping BP medications I was aware about the breast fluid and surgery upcoming. The surgeon saw her recently and did not feel she had active infection. If any syncope or pre-syncope she should go to urgent care or ER for fluids. Increase her oral fluids if able

## 2024-03-03 ENCOUNTER — Other Ambulatory Visit: Payer: Self-pay

## 2024-03-03 ENCOUNTER — Encounter (HOSPITAL_BASED_OUTPATIENT_CLINIC_OR_DEPARTMENT_OTHER): Payer: Self-pay | Admitting: Surgery

## 2024-03-04 ENCOUNTER — Ambulatory Visit: Admitting: Radiation Oncology

## 2024-03-04 MED ORDER — CHLORHEXIDINE GLUCONATE CLOTH 2 % EX PADS: 6.0000 | MEDICATED_PAD | Freq: Once | CUTANEOUS | Status: DC

## 2024-03-04 NOTE — Progress Notes (Signed)

## 2024-03-05 ENCOUNTER — Ambulatory Visit (HOSPITAL_BASED_OUTPATIENT_CLINIC_OR_DEPARTMENT_OTHER): Payer: Self-pay | Admitting: Anesthesiology

## 2024-03-05 ENCOUNTER — Encounter (HOSPITAL_BASED_OUTPATIENT_CLINIC_OR_DEPARTMENT_OTHER)
Admission: RE | Disposition: A | Discharge status: Home / Self Care | Payer: Self-pay | Source: Home / Self Care | Attending: Surgery

## 2024-03-05 ENCOUNTER — Encounter (HOSPITAL_BASED_OUTPATIENT_CLINIC_OR_DEPARTMENT_OTHER): Payer: Self-pay | Admitting: Surgery

## 2024-03-05 ENCOUNTER — Ambulatory Visit (HOSPITAL_BASED_OUTPATIENT_CLINIC_OR_DEPARTMENT_OTHER)
Admission: RE | Admit: 2024-03-05 | Discharge: 2024-03-05 | Disposition: A | Discharge status: Home / Self Care | Attending: Surgery | Admitting: Surgery

## 2024-03-05 ENCOUNTER — Other Ambulatory Visit: Payer: Self-pay

## 2024-03-05 DIAGNOSIS — Z923 Personal history of irradiation: Secondary | ICD-10-CM | POA: Diagnosis not present

## 2024-03-05 DIAGNOSIS — Z01818 Encounter for other preprocedural examination: Secondary | ICD-10-CM

## 2024-03-05 DIAGNOSIS — Z1732 Human epidermal growth factor receptor 2 negative status: Secondary | ICD-10-CM | POA: Diagnosis not present

## 2024-03-05 DIAGNOSIS — Z170 Estrogen receptor positive status [ER+]: Secondary | ICD-10-CM | POA: Insufficient documentation

## 2024-03-05 DIAGNOSIS — I10 Essential (primary) hypertension: Secondary | ICD-10-CM | POA: Diagnosis not present

## 2024-03-05 DIAGNOSIS — J4489 Other specified chronic obstructive pulmonary disease: Secondary | ICD-10-CM | POA: Insufficient documentation

## 2024-03-05 DIAGNOSIS — Z87891 Personal history of nicotine dependence: Secondary | ICD-10-CM | POA: Diagnosis not present

## 2024-03-05 DIAGNOSIS — Z1721 Progesterone receptor positive status: Secondary | ICD-10-CM | POA: Insufficient documentation

## 2024-03-05 DIAGNOSIS — C50411 Malignant neoplasm of upper-outer quadrant of right female breast: Secondary | ICD-10-CM | POA: Diagnosis not present

## 2024-03-05 DIAGNOSIS — L598 Other specified disorders of the skin and subcutaneous tissue related to radiation: Secondary | ICD-10-CM | POA: Diagnosis not present

## 2024-03-05 DIAGNOSIS — M96842 Postprocedural seroma of a musculoskeletal structure following a musculoskeletal system procedure: Secondary | ICD-10-CM | POA: Insufficient documentation

## 2024-03-05 HISTORY — PX: INCISION AND DRAINAGE, ABSCESS, BREAST: SHX7594

## 2024-03-05 HISTORY — PX: IRRIGATION AND DEBRIDEMENT HEMATOMA: SHX5254

## 2024-03-05 SURGERY — IRRIGATION AND DEBRIDEMENT HEMATOMA
Anesthesia: General | Laterality: Right

## 2024-03-05 MED ORDER — FENTANYL CITRATE (PF) 100 MCG/2ML IJ SOLN
INTRAMUSCULAR | Status: AC
Start: 2024-03-05 — End: 2024-03-05
  Filled 2024-03-05: qty 2

## 2024-03-05 MED ORDER — KETOROLAC TROMETHAMINE 30 MG/ML IJ SOLN
INTRAMUSCULAR | Status: AC
  Filled 2024-03-05: qty 1

## 2024-03-05 MED ORDER — 0.9 % SODIUM CHLORIDE (POUR BTL) OPTIME
TOPICAL | Status: DC | PRN
  Administered 2024-03-05: 1000 mL

## 2024-03-05 MED ORDER — ATROPINE SULFATE 0.4 MG/ML IV SOLN
INTRAVENOUS | Status: AC
  Filled 2024-03-05: qty 1

## 2024-03-05 MED ORDER — ACETAMINOPHEN 500 MG PO TABS
ORAL_TABLET | ORAL | Status: AC
  Filled 2024-03-05: qty 2

## 2024-03-05 MED ORDER — LACTATED RINGERS IV SOLN: INTRAVENOUS | Status: DC

## 2024-03-05 MED ORDER — PHENYLEPHRINE 80 MCG/ML (10ML) SYRINGE FOR IV PUSH (FOR BLOOD PRESSURE SUPPORT)
PREFILLED_SYRINGE | INTRAVENOUS | Status: AC
  Filled 2024-03-05: qty 10

## 2024-03-05 MED ORDER — HYDROMORPHONE HCL 1 MG/ML IJ SOLN
INTRAMUSCULAR | Status: AC
Start: 2024-03-05 — End: 2024-03-05
  Filled 2024-03-05: qty 0.5

## 2024-03-05 MED ORDER — OXYCODONE HCL 5 MG PO TABS: 5.0000 mg | ORAL_TABLET | Freq: Once | ORAL | Status: DC | PRN

## 2024-03-05 MED ORDER — LIDOCAINE 2% (20 MG/ML) 5 ML SYRINGE
INTRAMUSCULAR | Status: AC
  Filled 2024-03-05: qty 5

## 2024-03-05 MED ORDER — VANCOMYCIN HCL IN DEXTROSE 1-5 GM/200ML-% IV SOLN
1000.0000 mg | INTRAVENOUS | Status: AC
  Administered 2024-03-05: 1000 mg via INTRAVENOUS

## 2024-03-05 MED ORDER — ONDANSETRON HCL 4 MG/2ML IJ SOLN
INTRAMUSCULAR | Status: AC
  Filled 2024-03-05: qty 2

## 2024-03-05 MED ORDER — OXYCODONE HCL 5 MG/5ML PO SOLN: 5.0000 mg | Freq: Once | ORAL | Status: DC | PRN

## 2024-03-05 MED ORDER — MIDAZOLAM HCL 2 MG/2ML IJ SOLN: 0.5000 mg | Freq: Once | INTRAMUSCULAR | Status: DC | PRN

## 2024-03-05 MED ORDER — BUPIVACAINE-EPINEPHRINE (PF) 0.25% -1:200000 IJ SOLN
INTRAMUSCULAR | Status: AC
  Filled 2024-03-05: qty 30

## 2024-03-05 MED ORDER — MIDAZOLAM HCL 5 MG/5ML IJ SOLN
INTRAMUSCULAR | Status: DC | PRN
  Administered 2024-03-05 (×2): 1 mg via INTRAVENOUS

## 2024-03-05 MED ORDER — PROPOFOL 10 MG/ML IV BOLUS
INTRAVENOUS | Status: DC | PRN
  Administered 2024-03-05: 250 mg via INTRAVENOUS

## 2024-03-05 MED ORDER — ACETAMINOPHEN 500 MG PO TABS
1000.0000 mg | ORAL_TABLET | ORAL | Status: AC
  Administered 2024-03-05: 1000 mg via ORAL

## 2024-03-05 MED ORDER — DEXAMETHASONE SODIUM PHOSPHATE 4 MG/ML IJ SOLN
INTRAMUSCULAR | Status: DC | PRN
  Administered 2024-03-05: 4 mg via INTRAVENOUS

## 2024-03-05 MED ORDER — EPHEDRINE 5 MG/ML INJ
INTRAVENOUS | Status: AC
  Filled 2024-03-05: qty 5

## 2024-03-05 MED ORDER — LIDOCAINE 2% (20 MG/ML) 5 ML SYRINGE
INTRAMUSCULAR | Status: DC | PRN
  Administered 2024-03-05: 20 mg via INTRAVENOUS

## 2024-03-05 MED ORDER — BUPIVACAINE-EPINEPHRINE 0.25% -1:200000 IJ SOLN
INTRAMUSCULAR | Status: DC | PRN
  Administered 2024-03-05: 10 mL

## 2024-03-05 MED ORDER — ONDANSETRON HCL 4 MG/2ML IJ SOLN
INTRAMUSCULAR | Status: DC | PRN
  Administered 2024-03-05: 4 mg via INTRAVENOUS

## 2024-03-05 MED ORDER — DEXAMETHASONE SODIUM PHOSPHATE 10 MG/ML IJ SOLN
INTRAMUSCULAR | Status: AC
  Filled 2024-03-05: qty 1

## 2024-03-05 MED ORDER — FENTANYL CITRATE (PF) 100 MCG/2ML IJ SOLN
INTRAMUSCULAR | Status: DC | PRN
  Administered 2024-03-05 (×2): 50 ug via INTRAVENOUS

## 2024-03-05 MED ORDER — MIDAZOLAM HCL 2 MG/2ML IJ SOLN
INTRAMUSCULAR | Status: AC
  Filled 2024-03-05: qty 2

## 2024-03-05 MED ORDER — HYDROMORPHONE HCL 1 MG/ML IJ SOLN
0.2500 mg | INTRAMUSCULAR | Status: DC | PRN
  Administered 2024-03-05: 0.5 mg via INTRAVENOUS

## 2024-03-05 MED ORDER — SUCCINYLCHOLINE CHLORIDE 200 MG/10ML IV SOSY
PREFILLED_SYRINGE | INTRAVENOUS | Status: AC
  Filled 2024-03-05: qty 10

## 2024-03-05 MED ORDER — VANCOMYCIN HCL IN DEXTROSE 1-5 GM/200ML-% IV SOLN
INTRAVENOUS | Status: AC
Start: 2024-03-05 — End: 2024-03-05
  Filled 2024-03-05: qty 200

## 2024-03-05 SURGICAL SUPPLY — 38 items
APPLICATOR COTTON TIP 6 STRL (MISCELLANEOUS) IMPLANT
BENZOIN TINCTURE PRP APPL 2/3 (GAUZE/BANDAGES/DRESSINGS) ×1 IMPLANT
BLADE HEX COATED 2.75 (ELECTRODE) ×1 IMPLANT
BLADE SURG 15 STRL LF DISP TIS (BLADE) ×1 IMPLANT
CANISTER SUCT 1200ML W/VALVE (MISCELLANEOUS) ×1 IMPLANT
CHLORAPREP W/TINT 26 (MISCELLANEOUS) ×1 IMPLANT
CLIP APPLIE 9.375 MED OPEN (MISCELLANEOUS) IMPLANT
COVER BACK TABLE 60X90IN (DRAPES) ×1 IMPLANT
COVER MAYO STAND STRL (DRAPES) ×1 IMPLANT
DRAIN PENROSE .5X12 LATEX STL (DRAIN) IMPLANT
DRAPE LAPAROTOMY 100X72 PEDS (DRAPES) ×1 IMPLANT
DRAPE UTILITY XL STRL (DRAPES) ×1 IMPLANT
DRSG TEGADERM 4X4.75 (GAUZE/BANDAGES/DRESSINGS) ×1 IMPLANT
ELECTRODE REM PT RTRN 9FT ADLT (ELECTROSURGICAL) ×1 IMPLANT
GAUZE SPONGE 4X4 12PLY STRL LF (GAUZE/BANDAGES/DRESSINGS) ×1 IMPLANT
GLOVE BIO SURGEON STRL SZ7 (GLOVE) ×1 IMPLANT
GLOVE BIOGEL PI IND STRL 7.5 (GLOVE) ×1 IMPLANT
GOWN STRL REUS W/ TWL LRG LVL3 (GOWN DISPOSABLE) ×2 IMPLANT
KIT MARKER MARGIN INK (KITS) IMPLANT
NDL HYPO 25X1 1.5 SAFETY (NEEDLE) ×1 IMPLANT
NEEDLE HYPO 25X1 1.5 SAFETY (NEEDLE) ×1 IMPLANT
NS IRRIG 1000ML POUR BTL (IV SOLUTION) ×1 IMPLANT
PACK BASIN DAY SURGERY FS (CUSTOM PROCEDURE TRAY) ×1 IMPLANT
PENCIL SMOKE EVACUATOR (MISCELLANEOUS) ×1 IMPLANT
SLEEVE SCD COMPRESS KNEE MED (STOCKING) ×1 IMPLANT
SPIKE FLUID TRANSFER (MISCELLANEOUS) IMPLANT
SPONGE T-LAP 4X18 ~~LOC~~+RFID (SPONGE) ×1 IMPLANT
STRIP CLOSURE SKIN 1/2X4 (GAUZE/BANDAGES/DRESSINGS) ×1 IMPLANT
SUT CHROMIC 3 0 SH 27 (SUTURE) IMPLANT
SUT ETHILON 2 0 FS 18 (SUTURE) IMPLANT
SUT MON AB 4-0 PC3 18 (SUTURE) ×1 IMPLANT
SUT SILK 2 0 SH (SUTURE) IMPLANT
SUT VIC AB 3-0 SH 27X BRD (SUTURE) ×1 IMPLANT
SYR CONTROL 10ML LL (SYRINGE) ×1 IMPLANT
TOWEL GREEN STERILE FF (TOWEL DISPOSABLE) ×1 IMPLANT
TRAY FAXITRON CT DISP (TRAY / TRAY PROCEDURE) IMPLANT
TUBE CONNECTING 20X1/4 (TUBING) ×1 IMPLANT
YANKAUER SUCT BULB TIP NO VENT (SUCTIONS) ×1 IMPLANT

## 2024-03-05 NOTE — Anesthesia Postprocedure Evaluation (Signed)
 Anesthesia Post Note  Patient: [name]  Procedure(s) Performed: IRRIGATION AND DEBRIDEMENT HEMATOMA (Right) INCISION AND DRAINAGE, ABSCESS, BREAST (Right)     Patient location during evaluation: PACU Anesthesia Type: General Level of consciousness: awake and alert, oriented and patient cooperative Pain management: pain level controlled Vital Signs Assessment: post-procedure vital signs reviewed and stable Respiratory status: spontaneous breathing, nonlabored ventilation and respiratory function stable Cardiovascular status: blood pressure returned to baseline and stable Postop Assessment: no apparent nausea or vomiting, adequate PO intake and able to ambulate Anesthetic complications: no   No notable events documented.  Last Vitals:  Vitals:   03/05/24 1345 03/05/24 1400  BP: 106/70 (!) 91/51  Pulse: 62 65  Resp: 19 18  Temp:  (!) 36.3 C  SpO2: 93% 93%    Last Pain:  Vitals:   03/05/24 1400  TempSrc:   PainSc: 0-No pain                 Haruka Kowaleski,E. Teagan Ozawa

## 2024-03-05 NOTE — Interval H&P Note (Signed)
 History and Physical Interval Note:  03/05/2024 10:17 AM  [name]  has presented today for surgery, with the diagnosis of PERISTENT SEROMA RIGHT BREAST.  The various methods of treatment have been discussed with the patient and family. After consideration of risks, benefits and other options for treatment, the patient has consented to  Procedure(s) with comments: IRRIGATION AND DEBRIDEMENT HEMATOMA (Right) - WOUND EXPLORATION RIGHT CHEST WALL INCISION AND DRAINAGE, ABSCESS, BREAST (Right) as a surgical intervention.  The patient's history has been reviewed, patient examined, no change in status, stable for surgery.  I have reviewed the patient's chart and labs.  Questions were answered to the patient's satisfaction.     Donnice MARLA Lima

## 2024-03-05 NOTE — Discharge Instructions (Addendum)
 You have a Penrose drain coming out of the incision in your right armpit.  This will continue to drain fluid for a couple of weeks.  The amount of fluid should slowly decrease over time.  You will need to keep dry gauze taped over this area to absorb any drainage.  This dressing should be secured with tape.  If you take a shower, leave the dressing in place while you are showering.  Do not soak in a tub or swimming pool.  Change the dressing when you get out of the shower.  You may use your tramadol  as needed.  You may also use Tylenol  or ibuprofen .   Next dose of Tylenol  at 4:45pm if needed.    Post Anesthesia Home Care Instructions  Activity: Get plenty of rest for the remainder of the day. A responsible individual must stay with you for 24 hours following the procedure.  For the next 24 hours, DO NOT: -Drive a car -Advertising copywriter -Drink alcoholic beverages -Take any medication unless instructed by your physician -Make any legal decisions or sign important papers.  Meals: Start with liquid foods such as gelatin or soup. Progress to regular foods as tolerated. Avoid greasy, spicy, heavy foods. If nausea and/or vomiting occur, drink only clear liquids until the nausea and/or vomiting subsides. Call your physician if vomiting continues.  Special Instructions/Symptoms: Your throat may feel dry or sore from the anesthesia or the breathing tube placed in your throat during surgery. If this causes discomfort, gargle with warm salt water. The discomfort should disappear within 24 hours.  If you had a scopolamine patch placed behind your ear for the management of post- operative nausea and/or vomiting:  1. The medication in the patch is effective for 72 hours, after which it should be removed.  Wrap patch in a tissue and discard in the trash. Wash hands thoroughly with soap and water. 2. You may remove the patch earlier than 72 hours if you experience unpleasant side effects which may  include dry mouth, dizziness or visual disturbances. 3. Avoid touching the patch. Wash your hands with soap and water after contact with the patch.

## 2024-03-05 NOTE — Transfer of Care (Signed)
 Immediate Anesthesia Transfer of Care Note  Patient: [name]  Procedure(s) Performed: IRRIGATION AND DEBRIDEMENT HEMATOMA (Right) INCISION AND DRAINAGE, ABSCESS, BREAST (Right)  Patient Location: PACU  Anesthesia Type:General  Level of Consciousness: awake, alert , oriented, drowsy, and patient cooperative  Airway & Oxygen Therapy: Patient Spontanous Breathing and Patient connected to face mask oxygen  Post-op Assessment: Report given to RN and Post -op Vital signs reviewed and stable  Post vital signs: Reviewed and stable  Last Vitals:  Vitals Value Taken Time  BP    Temp    Pulse    Resp    SpO2      Last Pain:  Vitals:   03/05/24 1036  TempSrc: Temporal  PainSc: 7       Patients Stated Pain Goal: 3 (03/05/24 1036)  Complications: No notable events documented.

## 2024-03-05 NOTE — Anesthesia Procedure Notes (Signed)
 Procedure Name: LMA Insertion Date/Time: 03/05/2024 12:19 PM  Performed by: Emilio Rock BIRCH, CRNAPre-anesthesia Checklist: Patient identified, Emergency Drugs available, Suction available and Patient being monitored Patient Re-evaluated:Patient Re-evaluated prior to induction Oxygen Delivery Method: Circle System Utilized Preoxygenation: Pre-oxygenation with 100% oxygen Induction Type: IV induction Ventilation: Mask ventilation without difficulty LMA: LMA inserted LMA Size: 4.0 Number of attempts: 1 Airway Equipment and Method: bite block Placement Confirmation: positive ETCO2 Tube secured with: Tape Dental Injury: Teeth and Oropharynx as per pre-operative assessment

## 2024-03-05 NOTE — Op Note (Signed)
 Pre-op diagnosis: Chronic right chest wall seroma Postop diagnosis: Same Procedure performed: Wound exploration, right chest wall with evacuation of chest wall seroma Surgeon:Lillieanna Tuohy K Douglas Smolinsky Anesthesia: General Indications: This is a 68 year old female who is status post right breast lumpectomy and deep sentinel lymph node biopsy for invasive ductal carcinoma.  Both procedures were performed through the same axillary incision.  She developed a seroma in the deep part of her breast in the lumpectomy site.  We aspirated this several times.  Subsequently she seemed to heal well.  She underwent a course of radiation therapy with boost.  She developed radiation dermatitis and also developed swelling in the deep part of her breast.  She was found to have a large seroma cavity with no sign of abscess.  This was aspirated twice but seems to reaccumulate.  She presents now for wound exploration for placement of a drain.  Description of procedure: The patient is brought to the operating room and placed in the supine position on the operating room table.  After an adequate level general anesthesia was obtained, her right breast and axilla were prepped with ChloraPrep and draped sterile fashion.  A timeout was taken to ensure the proper patient and proper procedure.  We infiltrated the area around the right axillary incision with local anesthetic.  I opened this incision.  There has been some contracture of the scar after radiation so I released the contracture.  We dissected medially towards the palpable seroma.  We into the seroma cavity and evacuated 150 cc of clear yellow fluid.  There seem to be complete decompression.  I explored the size of the cavity with a long clamp.  This is approximately 9 cm in transverse length by 5 cm diameter.  There did not seem to be any sign of infection.  No hematoma noted.  There seem to be decompression of the breast.  I placed a Penrose drain deep into the seroma cavity.  We  brought this out through the incision.  This was secured with 2-0 Ethilon sutures.  The remainder of the incision was closed with interrupted 2-0 Ethilon sutures.  A dry dressing is applied.  The patient is then extubated and brought to recovery room in stable condition.  All sponge, instrument, and needle counts are correct.  Donnice POUR. Belinda, MD, [facility] Surgery  General Surgery   03/05/2024 12:50 PM

## 2024-03-05 NOTE — Anesthesia Preprocedure Evaluation (Addendum)
 Anesthesia Evaluation  Patient identified by MRN, date of birth, ID band Patient awake    Reviewed: Allergy & Precautions, NPO status , Patient's Chart, lab work & pertinent test results  History of Anesthesia Complications Negative for: history of anesthetic complications  Airway Mallampati: II  TM Distance: >3 FB Neck ROM: Full    Dental  (+) Dental Advisory Given   Pulmonary asthma , COPD,  COPD inhaler, former smoker   breath sounds clear to auscultation       Cardiovascular hypertension, Pt. on medications (-) angina  Rhythm:Regular Rate:Normal  '23 ECHO: EF 60 to 65%.  1. The LV has normal function, no regional wall motion abnormalities.  Grade I diastolic  dysfunction (impaired relaxation).   2. RVF is normal. The right ventricular size is normal.    3. The mitral valve is grossly normal. Trivial mitral valve regurgitation. No evidence of mitral stenosis.   4. The aortic valve is tricuspid. There is mild calcification of the aortic valve. Aortic valve regurgitation is not visualized. Aortic valve sclerosis is present, with no evidence of aortic valve stenosis.     Neuro/Psych TIA   GI/Hepatic Neg liver ROS,GERD  Controlled,,  Endo/Other  Zepbound : 02/07/2024 BMI 34  Renal/GU Renal InsufficiencyRenal disease     Musculoskeletal  (+) Arthritis ,    Abdominal   Peds  Hematology   Anesthesia Other Findings Breast cancer  Reproductive/Obstetrics                              Anesthesia Physical Anesthesia Plan  ASA: 3  Anesthesia Plan: General   Post-op Pain Management: Tylenol  PO (pre-op)*   Induction: Intravenous  PONV Risk Score and Plan: 3 and Ondansetron , Dexamethasone  and Treatment may vary due to age or medical condition  Airway Management Planned: LMA  Additional Equipment: None  Intra-op Plan:   Post-operative Plan:   Informed Consent: I have reviewed the  patients History and Physical, chart, labs and discussed the procedure including the risks, benefits and alternatives for the proposed anesthesia with the patient or authorized representative who has indicated his/her understanding and acceptance.     Dental advisory given  Plan Discussed with: CRNA and Surgeon  Anesthesia Plan Comments:          Anesthesia Quick Evaluation

## 2024-03-06 ENCOUNTER — Encounter (HOSPITAL_BASED_OUTPATIENT_CLINIC_OR_DEPARTMENT_OTHER): Payer: Self-pay | Admitting: Surgery

## 2024-03-09 ENCOUNTER — Telehealth: Payer: Self-pay

## 2024-03-09 NOTE — Telephone Encounter (Signed)
 Notified the pt regarding her Disability forms being completed,faxed, and confirmation received. Pt verbalized understanding, and will pick up her copy tomorrow 03/10/2024. No questions or concerns to be noted at this time.

## 2024-03-11 ENCOUNTER — Telehealth: Payer: Self-pay

## 2024-03-11 NOTE — Telephone Encounter (Signed)
 Pt stopped by the office to pick up her FMLA forms, but was told it was here. I called pt to let her know that I have it. She stated she will be back later next week to pick up. No questions or concern to be noted.

## 2024-03-16 NOTE — Telephone Encounter (Signed)
 Do you want to move forward w these forms then or have her get them to send additional info over to oncology?

## 2024-03-23 NOTE — Progress Notes (Signed)
 PROVIDER:  DONNICE DEWAYNE LIMA, MD  MRN: [id number] DOB: 18-Mar-1956 DATE OF ENCOUNTER: 03/23/2024 Interval History:   Breast MDC 11/19/23 [place]/ [place]     This is a 68 year old female with no family history of breast cancer who presents after routine screening mammogram revealed a mass in the right breast.  She is found to have a 0.9 x 0.8 cm mass located at 10:00 in the right breast, 8 cm from the nipple.  Axillary ultrasound was negative.  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  No previous breast surgery.  The patient has an adopted daughter who recently underwent bilateral mastectomies preceded by neoadjuvant chemotherapy.  She presents now with her husband to discuss surgical options.   On 11/23/2023, she underwent right breast magnetic seed localized lumpectomy and deep sentinel lymph node biopsy.  We performed both procedures through the same axillary incision.  Margins were negative.  Both sentinel lymph nodes were negative.  The patient called the office today because she is having increased swelling in her axilla and lateral breast causing pain and limited range of motion of the shoulder.  We worked her in the office on 12/08/2023 for what appears to be a seroma.  We aspirated 160 cc of serosanguineous seroma fluid.  On 12/11/2023, 30 cc were aspirated.  On 5/19, only 10 cc were aspirated.   The patient recently completed radiation therapy.  She has been feeling well and was fairly active with her normal physical activity as well as physical therapy.  For the last 3 days, the patient has developed exquisite sensitivity to the right breast and overall feeling of malaise.  She has some low-grade subjective fevers.  Her white blood cell count yesterday was mildly elevated at 11.8.  The PA from oncology ordered a CT scan of the chest.  This showed a fluid collection deep in the right breast.  There is some overlying skin thickening.  No  sign of wall enhancement.  She had a very small adjacent axillary fluid collection 1.8 cm.   The PA from oncology also ordered Keflex .  The patient has been on this for 1 week with no significant improvement.  She called their office again trying to reach either Dr. Loretha or Dr. Shannon.  Another nurse called in another prescription for Levaquin .  The patient is unclear why she is on 2 antibiotics.  I instructed her to stop her antibiotics   We saw the patient 02/18/24.  There was no sign of infection.  We aspirated 60 cc of clear seroma fluid but the seroma seems to be at the deepest extent of the available needles in our office.  At her recheck last week, the seroma seems to have reaccumulated.  Her skin showed some sign of improvement from the radiation dermatitis.  We referred her to [facility] where they were able to aspirate the seroma under ultrasound guidance.  150 cc was evacuated.  The patient felt much better.  However, the seroma has reaccumulated over the weekend.  Her skin is feeling much better.  On 03/05/2024, we performed a right chest wall exploration with evacuation of the chest wall seroma.  I placed a Penrose drain.  We evacuated over 150 cc of clear yellow fluid.  The seroma cavity measured approximately 9 x 5 cm.  There was no sign of infection.   This is her second postoperative visit.  She continues to have clear serous drainage coming from the  incision.  She has started to feel some firmness in the upper breast.  No erythema.  No fever.  Physical Examination:   Physical Exam   The right axillary incision is intact with the sutures in place.  There is some serous drainage on the dressing.  No surrounding cellulitis.  The breast seems less firm.  We removed 2 sutures that were holding the skin incision intact.  We left the drain in place for now. Assessment and Plan:   [name] is a 68 y.o. female who underwent evacuation of a chest wall seroma on 03/05/2024.  Diagnoses and  all orders for this visit:  Invasive ductal carcinoma of breast, female, right (CMS/HHS-HCC)     Continue with the drain in place.  Continue dry dressings over this area.  You may shower with the dressing in place.  Return in about 2 weeks (around 04/06/2024).   The plan was discussed in detail with the patient today, who expressed understanding.  The patient has my contact information, and understands to call me with any additional questions or concerns in the interval.  I would be happy to see the patient back sooner if the need arises.   MATTHEW KAI TSUEI, MD

## 2024-03-24 ENCOUNTER — Ambulatory Visit: Admitting: Internal Medicine

## 2024-03-24 ENCOUNTER — Encounter: Payer: Self-pay | Admitting: Internal Medicine

## 2024-03-24 VITALS — BP 120/82 | HR 78 | Temp 98.4°F | Ht 63.0 in | Wt 184.0 lb

## 2024-03-24 DIAGNOSIS — Z0000 Encounter for general adult medical examination without abnormal findings: Secondary | ICD-10-CM | POA: Diagnosis not present

## 2024-03-24 DIAGNOSIS — C50411 Malignant neoplasm of upper-outer quadrant of right female breast: Secondary | ICD-10-CM | POA: Diagnosis not present

## 2024-03-24 DIAGNOSIS — R7303 Prediabetes: Secondary | ICD-10-CM | POA: Diagnosis not present

## 2024-03-24 DIAGNOSIS — I10 Essential (primary) hypertension: Secondary | ICD-10-CM

## 2024-03-24 DIAGNOSIS — Z170 Estrogen receptor positive status [ER+]: Secondary | ICD-10-CM

## 2024-03-24 DIAGNOSIS — E7800 Pure hypercholesterolemia, unspecified: Secondary | ICD-10-CM

## 2024-03-24 LAB — COMPREHENSIVE METABOLIC PANEL WITH GFR
ALT: 11 U/L (ref 0–35)
AST: 15 U/L (ref 0–37)
Albumin: 4.2 g/dL (ref 3.5–5.2)
Alkaline Phosphatase: 81 U/L (ref 39–117)
BUN: 17 mg/dL (ref 6–23)
CO2: 28 meq/L (ref 19–32)
Calcium: 9.6 mg/dL (ref 8.4–10.5)
Chloride: 101 meq/L (ref 96–112)
Creatinine, Ser: 1.03 mg/dL (ref 0.40–1.20)
GFR: 56.92 mL/min — ABNORMAL LOW (ref >60.00)
Glucose, Bld: 89 mg/dL (ref 70–99)
Potassium: 4 meq/L (ref 3.5–5.1)
Sodium: 138 meq/L (ref 135–145)
Total Bilirubin: 0.6 mg/dL (ref 0.2–1.2)
Total Protein: 7.4 g/dL (ref 6.0–8.3)

## 2024-03-24 LAB — CBC
HCT: 40.2 % (ref 36.0–46.0)
Hemoglobin: 13.3 g/dL (ref 12.0–15.0)
MCHC: 33.1 g/dL (ref 30.0–36.0)
MCV: 87 fl (ref 78.0–100.0)
Platelets: 239 K/uL (ref 150.0–400.0)
RBC: 4.62 Mil/uL (ref 3.87–5.11)
RDW: 13.5 % (ref 11.5–15.5)
WBC: 6.1 K/uL (ref 4.0–10.5)

## 2024-03-24 LAB — LIPID PANEL
Cholesterol: 177 mg/dL (ref 0–200)
HDL: 39.2 mg/dL (ref >39.00)
LDL Cholesterol: 100 mg/dL — ABNORMAL HIGH (ref 0–99)
NonHDL: 137.63
Total CHOL/HDL Ratio: 5
Triglycerides: 190 mg/dL — ABNORMAL HIGH (ref 0.0–149.0)
VLDL: 38 mg/dL (ref 0.0–40.0)

## 2024-03-24 LAB — HEMOGLOBIN A1C: Hgb A1c MFr Bld: 5.9 % (ref 4.6–6.5)

## 2024-03-24 LAB — VITAMIN B12: Vitamin B-12: 282 pg/mL (ref 211–911)

## 2024-03-24 NOTE — Progress Notes (Signed)
   Subjective:   Patient ID: [name], female    DOB: 09/12/1955, 68 y.o.   MRN: [id number]  The patient is here for physical. Pertinent topics discussed: Discussed the use of AI scribe software for clinical note transcription with the patient, who gave verbal consent to proceed.  History of Present Illness [name] is a 68 year old female with breast cancer who presents for a follow-up regarding post-surgical complications and blood pressure management.  She recently underwent surgery due to a seroma that developed post-operatively. The seroma has continued to enlarge, and she is concerned about the possibility of sepsis. A drain is currently in place, and it continues to drain fluid. She is worried about her body's inability to resolve fluid accumulation, recalling a similar issue after a previous surgery.  Her blood pressure has increased over the past two days. Recent blood pressure readings were 126/68 and 133/76, but she is concerned about the diastolic number reaching 80. She had previously stopped taking losartan /hctz and Zetia  and informed her doctor.  She has experienced significant weight loss, dropping from 268 pounds to the 180s, which she attributes to her use of Wegovy . She is mindful of her diet and reports improved self-confidence and mental well-being. She has not been able to exercise recently due to her current medical issues but is eager to resume.  PMH, [facility], social history reviewed and updated  Review of Systems  Constitutional: Negative.   HENT: Negative.    Eyes: Negative.   Respiratory:  Negative for cough, chest tightness and shortness of breath.   Cardiovascular:  Negative for chest pain, palpitations and leg swelling.  Gastrointestinal:  Negative for abdominal distention, abdominal pain, constipation, diarrhea, nausea and vomiting.  Musculoskeletal: Negative.   Skin: Negative.   Neurological: Negative.   Psychiatric/Behavioral: Negative.       Objective:  Physical Exam Constitutional:      Appearance: She is well-developed.  HENT:     Head: Normocephalic and atraumatic.  Cardiovascular:     Rate and Rhythm: Normal rate and regular rhythm.  Pulmonary:     Effort: Pulmonary effort is normal. No respiratory distress.     Breath sounds: Normal breath sounds. No wheezing or rales.  Abdominal:     General: Bowel sounds are normal. There is no distension.     Palpations: Abdomen is soft.     Tenderness: There is no abdominal tenderness. There is no rebound.  Musculoskeletal:     Cervical back: Normal range of motion.  Skin:    General: Skin is warm and dry.  Neurological:     Mental Status: She is alert and oriented to person, place, and time.     Coordination: Coordination normal.     Vitals:   03/24/24 0801  BP: 120/82  Pulse: 78  Temp: 98.4 F (36.9 C)  TempSrc: Oral  SpO2: 98%  Weight: 184 lb (83.5 kg)  Height: 5' 3 (1.6 m)    Assessment & Plan:

## 2024-03-24 NOTE — Assessment & Plan Note (Signed)
 Taking arimidex .

## 2024-03-24 NOTE — Assessment & Plan Note (Signed)
 BP at goal on no meds. Monitor BP closely.

## 2024-03-24 NOTE — Progress Notes (Signed)
 Radiation Oncology         [phone] ________________________________  Name: [name] MRN: [id number]  Date: 03/25/2024  DOB: 07-13-1956  Follow-Up Visit Note  CC: Rollene Almarie LABOR, MD  Rollene Almarie A, *  No diagnosis found.  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, ER+ / PR+ / Her2-    Interval Since Last Radiation: 1 month 27 days    Intent: Curative  Radiation Treatment Dates: First Treatment Date: 2023-12-30 -- Last Treatment Date: 2024-01-27 Site/Dose/Technique/Mode:  Plan Name: Breast_R Site: Breast, Right Technique: 3D Mode: Photon Dose Per Fraction: 2.67 Gy Prescribed Dose (Delivered / Prescribed): 18.69 Gy / 18.69 Gy Prescribed Fxs (Delivered / Prescribed): 7 / 7   Plan Name: Breast_R:1 Site: Breast, Right Technique: 3D Mode: Photon Dose Per Fraction: 2.67 Gy Prescribed Dose (Delivered / Prescribed): 21.36 Gy / 21.36 Gy Prescribed Fxs (Delivered / Prescribed): 8 / 8   Cumulative dose to the right breast was 40.05 Gy, lumpectomy cavity boost to an additional 10 Gy   Plan Name: Breast_R_Bst Site: Breast, Right Technique: 3D Mode: Photon Dose Per Fraction: 2 Gy Prescribed Dose (Delivered / Prescribed): 10 Gy / 10 Gy Prescribed Fxs (Delivered / Prescribed): 5 / 5  Narrative:  The patient returns today for routine follow-up. She was last seen in office on 02/23/24 for a follow up visit. Since then, patient completed her radiation treatment which she tolerated quite well. Patient did however endorse experiencing fatigue, breast swelling, and anticipated skin changes in the treatment field.   In the interval since she was last seen, she presented for a follow up visit with Dr. Belinda on 03/01/24 during which she had reaccumulated a fairly large seroma deep in the right breast up against the chest wall for which she opted to proceed with a wound exploration procedure with evacuation of  the chest wall seroma. She underwent this procedure on 03/05/24 where a Penrose drain was also placed.  She returned for a follow up with Dr. Belinda on 03/15/24 during which she reported feeling well overall and seroma continues to drain with fluid decreasing over time. Her most recent follow up on 03/23/24, during which sutures were removed but drain was left in place as seroma continues to drain.   No other significant oncologic interval history since the patient was last seen.                                   Allergies:  is allergic to doxycycline , hydrocodone  bit-homatrop mbr, penicillins, promethazine hcl, clarithromycin, statins, and kiwi extract.  Meds: Current Outpatient Medications  Medication Sig Dispense Refill   albuterol  (PROVENTIL ) (2.5 MG/3ML) 0.083% nebulizer solution Take 3 mLs (2.5 mg total) by nebulization every 6 (six) hours as needed for wheezing or shortness of breath. 150 mL 1   albuterol  (VENTOLIN  HFA) 108 (90 Base) MCG/ACT inhaler Inhale 1-2 puffs into the lungs every 4 (four) hours as needed for wheezing or shortness of breath. 18 g 0   anastrozole  (ARIMIDEX ) 1 MG tablet Take 1 tablet (1 mg total) by mouth daily. 90 tablet 3   cetirizine  (ZYRTEC ) 10 MG tablet Take 1 tablet (10 mg total) by mouth daily. 90 tablet 3   citalopram  (CELEXA ) 10 MG tablet Take 1 tablet (10 mg total) by mouth daily. 90 tablet 3   ezetimibe  (ZETIA ) 10 MG tablet Take 1 tablet (  10 mg total) by mouth daily. (Patient not taking: Reported on 03/24/2024) 90 tablet 1   fluticasone  (FLONASE ) 50 MCG/ACT nasal spray Place 2 sprays into both nostrils 2 (two) times daily as needed.  5   losartan -hydrochlorothiazide  (HYZAAR) 50-12.5 MG tablet Take 1 tablet by mouth daily. (Patient not taking: Reported on 03/24/2024)     montelukast  (SINGULAIR ) 10 MG tablet Take 1 tablet (10 mg total) by mouth at bedtime. 90 tablet 3   ondansetron  (ZOFRAN ) 8 MG tablet Take 1 tablet (8 mg total) by mouth every 8 (eight) hours as  needed for nausea or vomiting. 20 tablet 0   REPATHA  SURECLICK 140 MG/ML SOAJ ADMINISTER 1 ML UNDER THE SKIN EVERY 14 DAYS 2 mL 11   tirzepatide  (ZEPBOUND ) 10 MG/0.5ML Pen Inject 10 mg into the skin once a week. (Patient not taking: Reported on 03/24/2024) 6 mL 3   traMADol  (ULTRAM ) 50 MG tablet Take 1-2 tablets (50-100 mg total) by mouth every 6 (six) hours as needed for moderate pain (pain score 4-6) or severe pain (pain score 7-10). 30 tablet 0   No current facility-administered medications for this encounter.    Physical Findings: The patient is in no acute distress. Patient is alert and oriented.  vitals were not taken for this visit. .  No significant changes. Lungs are clear to auscultation bilaterally. Heart has regular rate and rhythm. No palpable cervical, supraclavicular, or axillary adenopathy. Abdomen soft, non-tender, normal bowel sounds.   Lab Findings: Lab Results  Component Value Date   WBC 11.8 (H) 02/16/2024   HGB 14.0 02/16/2024   HCT 40.4 02/16/2024   MCV 85.8 02/16/2024   PLT 167 02/16/2024    Radiographic Findings: No results found.  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  ***  Plan:  ***   *** minutes of total time was spent for this patient encounter, including preparation, face-to-face counseling with the patient and coordination of care, physical exam, and documentation of the encounter. ____________________________________  Lynwood CHARM Nasuti, PhD, MD  This document serves as a record of services personally performed by Lynwood Nasuti, MD. It was created on his behalf by Reymundo Cartwright, a trained medical scribe. The creation of this record is based on the scribe's personal observations and the provider's statements to them. This document has been checked and approved by the attending provider.

## 2024-03-24 NOTE — Assessment & Plan Note (Signed)
Checking HgA1c. 

## 2024-03-24 NOTE — Assessment & Plan Note (Signed)
 Checking lipid panel and on repatha .

## 2024-03-24 NOTE — Assessment & Plan Note (Signed)
 Flu shot yearly. Pneumonia counseled. Shingrix counseled. Tetanus up to date. Colonoscopy up to date. Mammogram up to date, pap smear up to date and dexa up to date. Counseled about sun safety and mole surveillance. Counseled about the dangers of distracted driving. Given 10 year screening recommendations.

## 2024-03-25 ENCOUNTER — Ambulatory Visit
Admission: RE | Admit: 2024-03-25 | Discharge: 2024-03-25 | Disposition: A | Discharge status: Home / Self Care | Source: Ambulatory Visit | Attending: Radiation Oncology | Admitting: Radiation Oncology

## 2024-03-25 ENCOUNTER — Encounter: Payer: Self-pay | Admitting: Radiation Oncology

## 2024-03-25 ENCOUNTER — Other Ambulatory Visit (HOSPITAL_COMMUNITY): Payer: Self-pay

## 2024-03-25 ENCOUNTER — Telehealth: Payer: Self-pay

## 2024-03-25 VITALS — BP 113/80 | HR 88 | Temp 97.1°F | Resp 18 | Ht 63.0 in | Wt 190.2 lb

## 2024-03-25 DIAGNOSIS — Z923 Personal history of irradiation: Secondary | ICD-10-CM | POA: Diagnosis not present

## 2024-03-25 DIAGNOSIS — C50411 Malignant neoplasm of upper-outer quadrant of right female breast: Secondary | ICD-10-CM | POA: Diagnosis present

## 2024-03-25 DIAGNOSIS — Z79811 Long term (current) use of aromatase inhibitors: Secondary | ICD-10-CM | POA: Insufficient documentation

## 2024-03-25 DIAGNOSIS — Z79899 Other long term (current) drug therapy: Secondary | ICD-10-CM | POA: Diagnosis not present

## 2024-03-25 DIAGNOSIS — Z170 Estrogen receptor positive status [ER+]: Secondary | ICD-10-CM | POA: Diagnosis not present

## 2024-03-25 DIAGNOSIS — Z1721 Progesterone receptor positive status: Secondary | ICD-10-CM | POA: Insufficient documentation

## 2024-03-25 DIAGNOSIS — Z1732 Human epidermal growth factor receptor 2 negative status: Secondary | ICD-10-CM | POA: Insufficient documentation

## 2024-03-25 NOTE — Progress Notes (Signed)
 [name] is here today for follow up post radiation to the breast.   Breast Side:Right   They completed their radiation on: 01/27/24   Does the patient complain of any of the following: Post radiation skin issues: Denies Breast Tenderness: Yes Breast Swelling: Yes Lymphadema: Yes Range of Motion limitations: Yes, she states that she had to put physcical on hold until she has healed from surgery Fatigue post radiation: Yes Appetite good/fair/poor: Good  Additional comments if applicable: None  BP 113/80 (BP Location: Left Arm, Patient Position: Sitting)   Pulse 88   Temp (!) 97.1 F (36.2 C) (Temporal)   Resp 18   Ht 5' 3 (1.6 m)   Wt 190 lb 4 oz (86.3 kg)   SpO2 98%   BMI 33.70 kg/m

## 2024-03-25 NOTE — Telephone Encounter (Signed)
 Pharmacy Patient Advocate Encounter   Received notification from Onbase that prior authorization for Zepbound  10MG /0.5ML pen-injectors  is required/requested.   Insurance verification completed.   The patient is insured through [facility] .   Per test claim: PA required; PA started via CoverMyMeds. KEY [id number] . Please see clinical question(s) below that I am not finding the answer to in their chart and advise.

## 2024-03-26 ENCOUNTER — Telehealth: Payer: Self-pay

## 2024-03-26 NOTE — Telephone Encounter (Signed)
 Called patient and informed her that her form for her physical is ready and asked if we could email it to her.

## 2024-03-26 NOTE — Telephone Encounter (Signed)
 Form has been placed in box

## 2024-03-26 NOTE — Telephone Encounter (Signed)
Please advise if this change is appropriate?

## 2024-03-27 ENCOUNTER — Other Ambulatory Visit: Payer: Self-pay | Admitting: Internal Medicine

## 2024-03-29 ENCOUNTER — Ambulatory Visit: Payer: Self-pay | Admitting: Internal Medicine

## 2024-03-30 MED ORDER — WEGOVY 1.7 MG/0.75ML ~~LOC~~ SOAJ: 1.7000 mg | SUBCUTANEOUS | 0 refills | Status: AC

## 2024-03-30 MED ORDER — WEGOVY 1 MG/0.5ML ~~LOC~~ SOAJ: 1.0000 mg | SUBCUTANEOUS | 0 refills | Status: AC

## 2024-03-30 MED ORDER — WEGOVY 2.4 MG/0.75ML ~~LOC~~ SOAJ: 2.4000 mg | SUBCUTANEOUS | 3 refills | Status: DC

## 2024-03-30 NOTE — Telephone Encounter (Signed)
 Does patient want to switch to wegovy ? She can if she is okay with that

## 2024-04-02 ENCOUNTER — Other Ambulatory Visit: Payer: Self-pay | Admitting: Internal Medicine

## 2024-04-16 ENCOUNTER — Encounter: Payer: Self-pay | Admitting: Cardiology

## 2024-04-26 ENCOUNTER — Ambulatory Visit: Attending: Radiation Oncology

## 2024-04-26 DIAGNOSIS — Z170 Estrogen receptor positive status [ER+]: Secondary | ICD-10-CM | POA: Insufficient documentation

## 2024-04-26 DIAGNOSIS — R293 Abnormal posture: Secondary | ICD-10-CM | POA: Insufficient documentation

## 2024-04-26 DIAGNOSIS — N63 Unspecified lump in unspecified breast: Secondary | ICD-10-CM | POA: Insufficient documentation

## 2024-04-26 DIAGNOSIS — C50411 Malignant neoplasm of upper-outer quadrant of right female breast: Secondary | ICD-10-CM | POA: Insufficient documentation

## 2024-04-26 DIAGNOSIS — G8929 Other chronic pain: Secondary | ICD-10-CM | POA: Insufficient documentation

## 2024-04-26 DIAGNOSIS — M25511 Pain in right shoulder: Secondary | ICD-10-CM | POA: Insufficient documentation

## 2024-04-26 DIAGNOSIS — I890 Lymphedema, not elsewhere classified: Secondary | ICD-10-CM | POA: Insufficient documentation

## 2024-04-28 NOTE — Progress Notes (Incomplete)
 Radiation Oncology         (336) [phone] ________________________________  Name: [name] MRN: [id number]  Date: 04/29/2024  DOB: 1956/07/07  Follow-Up Visit Note  CC: Rollene Almarie LABOR, MD  Rollene Almarie A, *  No diagnosis found.  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, ER+ / PR+ / Her2-   Interval Since Last Radiation:  3 months 1 day  Intent: curative  First Treatment Date: 2023-12-30 Last Treatment Date: 2024-01-27   Plan Name: Breast_R Site: Breast, Right Technique: 3D Mode: Photon Dose Per Fraction: 2.67 Gy Prescribed Dose (Delivered / Prescribed): 18.69 Gy / 18.69 Gy Prescribed Fxs (Delivered / Prescribed): 7 / 7   Plan Name: Breast_R:1 Site: Breast, Right Technique: 3D Mode: Photon Dose Per Fraction: 2.67 Gy Prescribed Dose (Delivered / Prescribed): 21.36 Gy / 21.36 Gy Prescribed Fxs (Delivered / Prescribed): 8 / 8   Plan Name: Breast_R_Bst Site: Breast, Right Technique: 3D Mode: Photon Dose Per Fraction: 2 Gy Prescribed Dose (Delivered / Prescribed): 10 Gy / 10 Gy Prescribed Fxs (Delivered / Prescribed): 5 / 5  Narrative:  The patient returns today for routine follow-up. She was last seen in office on 03/25/24 for a follow up visit.   In the interval since she was last seen, she presented for a follow up visit with Dr. Belinda on 04/06/24 during which her sutures were removed with minimal drainage observed. She returned on 9/15 during which the wound was noted to continue healing without signs of reaccumulated seroma.   No other significant oncologic interval history since the patient was last seen.                                    Allergies:  is allergic to doxycycline , hydrocodone  bit-homatrop mbr, penicillins, promethazine hcl, clarithromycin, statins, and kiwi extract.  Meds: Current Outpatient Medications  Medication Sig Dispense Refill   albuterol  (PROVENTIL ) (2.5  MG/3ML) 0.083% nebulizer solution Take 3 mLs (2.5 mg total) by nebulization every 6 (six) hours as needed for wheezing or shortness of breath. 150 mL 1   albuterol  (VENTOLIN  HFA) 108 (90 Base) MCG/ACT inhaler Inhale 1-2 puffs into the lungs every 4 (four) hours as needed for wheezing or shortness of breath. 18 g 0   anastrozole  (ARIMIDEX ) 1 MG tablet Take 1 tablet (1 mg total) by mouth daily. 90 tablet 3   cetirizine  (ZYRTEC ) 10 MG tablet Take 1 tablet (10 mg total) by mouth daily. 90 tablet 3   citalopram  (CELEXA ) 10 MG tablet Take 1 tablet (10 mg total) by mouth daily. 90 tablet 3   fluticasone  (FLONASE ) 50 MCG/ACT nasal spray Place 2 sprays into both nostrils 2 (two) times daily as needed.  5   losartan -hydrochlorothiazide  (HYZAAR) 50-12.5 MG tablet Take 1 tablet by mouth daily. (Patient not taking: Reported on 03/24/2024)     montelukast  (SINGULAIR ) 10 MG tablet Take 1 tablet (10 mg total) by mouth at bedtime. 90 tablet 3   ondansetron  (ZOFRAN ) 8 MG tablet Take 1 tablet (8 mg total) by mouth every 8 (eight) hours as needed for nausea or vomiting. 20 tablet 0   REPATHA  SURECLICK 140 MG/ML SOAJ ADMINISTER 1 ML UNDER THE SKIN EVERY 14 DAYS 2 mL 11   semaglutide -weight management (WEGOVY ) 1.7 MG/0.75ML SOAJ SQ injection Inject 1.7 mg into the skin once a week for 28 days. 3 mL 0   [  START ON 05/27/2024] semaglutide -weight management (WEGOVY ) 2.4 MG/0.75ML SOAJ SQ injection Inject 2.4 mg into the skin once a week. 3 mL 3   tirzepatide  (ZEPBOUND ) 10 MG/0.5ML Pen Inject 10 mg into the skin once a week. (Patient not taking: Reported on 03/24/2024) 6 mL 3   traMADol  (ULTRAM ) 50 MG tablet Take 1-2 tablets (50-100 mg total) by mouth every 6 (six) hours as needed for moderate pain (pain score 4-6) or severe pain (pain score 7-10). 30 tablet 0   No current facility-administered medications for this visit.    Physical Findings: The patient is in no acute distress. Patient is alert and oriented.  vitals were  not taken for this visit. .  No significant changes. Lungs are clear to auscultation bilaterally. Heart has regular rate and rhythm. No palpable cervical, supraclavicular, or axillary adenopathy. Abdomen soft, non-tender, normal bowel sounds.   Lab Findings: Lab Results  Component Value Date   WBC 6.1 03/24/2024   HGB 13.3 03/24/2024   HCT 40.2 03/24/2024   MCV 87.0 03/24/2024   PLT 239.0 03/24/2024    Radiographic Findings: No results found.  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  ***  Plan:  ***   *** minutes of total time was spent for this patient encounter, including preparation, face-to-face counseling with the patient and coordination of care, physical exam, and documentation of the encounter. ____________________________________  Lynwood CHARM Nasuti, PhD, MD  This document serves as a record of services personally performed by Lynwood Nasuti, MD. It was created on his behalf by Reymundo Cartwright, a trained medical scribe. The creation of this record is based on the scribe's personal observations and the provider's statements to them. This document has been checked and approved by the attending provider.

## 2024-04-29 ENCOUNTER — Ambulatory Visit: Payer: Self-pay | Admitting: Radiation Oncology

## 2024-04-30 NOTE — Progress Notes (Signed)
 Radiation Oncology         (336) [phone] ________________________________  Name: [name] MRN: [id number]  Date: 05/03/2024  DOB: Feb 02, 1956  Follow-Up Visit Note  CC: Rollene Almarie LABOR, MD  Rollene Almarie LABOR, *    ICD-10-CM   1. 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, ER+ / PR+ / Her2-   Interval Since Last Radiation: 6 months and 3 days   Intent: Curative  Radiation Treatment Dates: First Treatment Date: 2023-12-30 -- Last Treatment Date: 2024-01-27 Site/Dose/Technique/Mode:  Plan Name: Breast_R Site: Breast, Right Technique: 3D Mode: Photon Dose Per Fraction: 2.67 Gy Prescribed Dose (Delivered / Prescribed): 18.69 Gy / 18.69 Gy Prescribed Fxs (Delivered / Prescribed): 7 / 7   Plan Name: Breast_R:1 Site: Breast, Right Technique: 3D Mode: Photon Dose Per Fraction: 2.67 Gy Prescribed Dose (Delivered / Prescribed): 21.36 Gy / 21.36 Gy Prescribed Fxs (Delivered / Prescribed): 8 / 8   Cumulative dose to the right breast was 40.05 Gy, lumpectomy cavity boost to an additional 10 Gy   Plan Name: Breast_R_Bst Site: Breast, Right Technique: 3D Mode: Photon Dose Per Fraction: 2 Gy Prescribed Dose (Delivered / Prescribed): 10 Gy / 10 Gy Prescribed Fxs (Delivered / Prescribed): 5 / 5  Narrative:  The patient returns today for routine follow-up. She was last seen here for a follow-up visit on 03/25/24.   To review from her last visit, she was dealing with ongoing drainage and pain from her surgical site after undergoing chest wall evacuation with drain placement on 03/05/2024 under the care of Dr. Belinda.   Since her last visit, she accordingly followed up with Dr. Belinda on 04/06/24. Per encounter notes, she did have some ongoing but  minimal clear serous drainage coming from the incision site at that time. She also reported feeling some firmness in the upper breast. Dr. Belinda removed her drain at that time (given that the majority of her sutures came loose on their own). There were no signs of infection, cellulitis, or recurrent seroma noted and her remaining sutures were easily removed.           She most recently followed up with Dr. Belinda on 04/19/24. The surgical site appeared to be healing well at that time, and she was advised to continue practicing routine wound care.    She did see Dr. Belinda again this morning and her surgical scar was completely healed.  The patient was complaining of some discomfort in the lower aspect of the breast which has been present for the past 2 weeks.  An ultrasound has been ordered to evaluate this issue.                 Allergies:  is allergic to doxycycline , hydrocodone  bit-homatrop mbr, penicillins, promethazine hcl, clarithromycin, statins, and kiwi extract.  Meds: Current Outpatient Medications  Medication Sig Dispense Refill   albuterol  (PROVENTIL ) (2.5 MG/3ML) 0.083% nebulizer solution Take 3 mLs (2.5 mg total) by nebulization every 6 (six) hours as needed for wheezing or shortness of breath. 150 mL 1   albuterol  (VENTOLIN  HFA) 108 (90 Base) MCG/ACT inhaler Inhale 1-2 puffs into the lungs every 4 (four) hours as needed for wheezing or shortness of breath. 18 g 0   anastrozole  (ARIMIDEX ) 1 MG tablet Take 1 tablet (  1 mg total) by mouth daily. 90 tablet 3   cetirizine  (ZYRTEC ) 10 MG tablet Take 1 tablet (10 mg total) by mouth daily. 90 tablet 3   citalopram  (CELEXA ) 10 MG tablet Take 1 tablet (10 mg total) by mouth daily. 90 tablet 3   fluticasone  (FLONASE ) 50 MCG/ACT nasal spray Place 2 sprays into both nostrils 2 (two) times daily as needed.  5   losartan -hydrochlorothiazide  (HYZAAR) 50-12.5 MG tablet Take 1 tablet by mouth daily.     montelukast  (SINGULAIR ) 10 MG tablet Take 1  tablet (10 mg total) by mouth at bedtime. 90 tablet 3   ondansetron  (ZOFRAN ) 8 MG tablet Take 1 tablet (8 mg total) by mouth every 8 (eight) hours as needed for nausea or vomiting. 20 tablet 0   REPATHA  SURECLICK 140 MG/ML SOAJ ADMINISTER 1 ML UNDER THE SKIN EVERY 14 DAYS 2 mL 11   semaglutide -weight management (WEGOVY ) 1.7 MG/0.75ML SOAJ SQ injection Inject 1.7 mg into the skin once a week for 28 days. 3 mL 0   [START ON 05/27/2024] semaglutide -weight management (WEGOVY ) 2.4 MG/0.75ML SOAJ SQ injection Inject 2.4 mg into the skin once a week. 3 mL 3   traMADol  (ULTRAM ) 50 MG tablet Take 1-2 tablets (50-100 mg total) by mouth every 6 (six) hours as needed for moderate pain (pain score 4-6) or severe pain (pain score 7-10). 30 tablet 0   tirzepatide  (ZEPBOUND ) 10 MG/0.5ML Pen Inject 10 mg into the skin once a week. (Patient not taking: Reported on 05/03/2024) 6 mL 3   No current facility-administered medications for this encounter.    Physical Findings: The patient is in no acute distress. Patient is alert and oriented.  height is 5' 3 (1.6 m) (pended) and weight is 195 lb 6 oz (88.6 kg) (pended). Her temporal temperature is 97.1 F (36.2 C) (abnormal, pended). Her blood pressure is 146/86 (abnormal, pended) and her pulse is 78 (pended). Her respiration is 18 (pended). .   Lungs are clear to auscultation bilaterally. Heart has regular rate and rhythm. No palpable cervical, supraclavicular, or axillary adenopathy. Abdomen soft, non-tender, normal bowel sounds.  Left Breast: no palpable mass, nipple discharge or bleeding. Right Breast: Surgical scar in the upper outer aspect of the breast is healed well at this time without any signs of drainage or infection.  Mild to moderate hyperpigmentation changes noted throughout the right breast.  No dominant mass appreciated in the breast nipple discharge or bleeding.  The patient does notice some tenderness with palpation in the lower inner aspect of the  breast with possibly a cyst in this area measuring at most 1 cm.  Lab Findings: Lab Results  Component Value Date   WBC 6.1 03/24/2024   HGB 13.3 03/24/2024   HCT 40.2 03/24/2024   MCV 87.0 03/24/2024   PLT 239.0 03/24/2024    Radiographic Findings: No results found.  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  No signs of recurrence on clinical exam today.  As above the patient will proceed with ultrasound to evaluate tenderness in the lower inner aspect of the breast.  I have asked the patient to have a copy of this ultrasound report forwarded to our office.  She continues to have some cording in the right axillary upper arm area and will resume physical therapy for this issue.  She will  also be seen by physical therapy for mild breast lymphedema.  Patient reports a low diastolic blood pressure of 37 yesterday.  She is scheduled to see her cardiologist later today concerning this issue.  Plan: As needed follow-up in radiation oncology assuming ultrasound shows no suspicious issues. She  will proceed with physical therapy now that she is healed well.  Order for physical therapy placed today.  She continues on Arimidex  and will follow-up with Dr. Loretha.  She is also scheduled to see Dr. Belinda in approximately 6 months.   20 minutes of total time was spent for this patient encounter, including preparation, face-to-face counseling with the patient and coordination of care, physical exam, and documentation of the encounter. ____________________________________  Lynwood CHARM Nasuti, PhD, MD  This document serves as a record of services personally performed by Lynwood Nasuti, MD. It was created on his behalf by Dorthy Fuse, a trained medical scribe. The creation of this record is based on the scribe's personal observations and the provider's  statements to them. This document has been checked and approved by the attending provider.

## 2024-05-03 ENCOUNTER — Ambulatory Visit: Attending: Nurse Practitioner | Admitting: Emergency Medicine

## 2024-05-03 ENCOUNTER — Telehealth: Payer: Self-pay | Admitting: Cardiology

## 2024-05-03 ENCOUNTER — Encounter: Payer: Self-pay | Admitting: Radiation Oncology

## 2024-05-03 ENCOUNTER — Ambulatory Visit
Admission: RE | Admit: 2024-05-03 | Discharge: 2024-05-03 | Disposition: A | Discharge status: Home / Self Care | Source: Ambulatory Visit | Attending: Radiation Oncology | Admitting: Radiation Oncology

## 2024-05-03 ENCOUNTER — Other Ambulatory Visit: Payer: Self-pay | Admitting: Surgery

## 2024-05-03 ENCOUNTER — Encounter: Payer: Self-pay | Admitting: Nurse Practitioner

## 2024-05-03 VITALS — Ht 63.5 in | Wt 193.0 lb

## 2024-05-03 DIAGNOSIS — Z79811 Long term (current) use of aromatase inhibitors: Secondary | ICD-10-CM | POA: Diagnosis not present

## 2024-05-03 DIAGNOSIS — R55 Syncope and collapse: Secondary | ICD-10-CM

## 2024-05-03 DIAGNOSIS — C50411 Malignant neoplasm of upper-outer quadrant of right female breast: Secondary | ICD-10-CM | POA: Insufficient documentation

## 2024-05-03 DIAGNOSIS — Z1732 Human epidermal growth factor receptor 2 negative status: Secondary | ICD-10-CM | POA: Insufficient documentation

## 2024-05-03 DIAGNOSIS — Z79899 Other long term (current) drug therapy: Secondary | ICD-10-CM | POA: Diagnosis not present

## 2024-05-03 DIAGNOSIS — Z170 Estrogen receptor positive status [ER+]: Secondary | ICD-10-CM | POA: Diagnosis not present

## 2024-05-03 DIAGNOSIS — I890 Lymphedema, not elsewhere classified: Secondary | ICD-10-CM | POA: Diagnosis not present

## 2024-05-03 DIAGNOSIS — E785 Hyperlipidemia, unspecified: Secondary | ICD-10-CM | POA: Diagnosis not present

## 2024-05-03 DIAGNOSIS — R600 Localized edema: Secondary | ICD-10-CM

## 2024-05-03 DIAGNOSIS — Z923 Personal history of irradiation: Secondary | ICD-10-CM | POA: Insufficient documentation

## 2024-05-03 DIAGNOSIS — Z1721 Progesterone receptor positive status: Secondary | ICD-10-CM | POA: Insufficient documentation

## 2024-05-03 NOTE — Progress Notes (Signed)
 [name] is here today for follow up post radiation to the breast.   Breast Side:Right Breast   They completed their radiation on: 01/27/24  Does the patient complain of any of the following: Post radiation skin issues: Denies Breast Tenderness: Yes Breast Swelling: Yes Lymphadema: Yes Range of Motion limitations: Yes, she reports cording. Fatigue post radiation: Reports moderate fatigue. Appetite good/fair/poor: Good  Additional comments if applicable: Wants to start physical therapy again.  BP (!) (P) 146/86 (BP Location: Left Arm, Patient Position: Sitting)   Pulse (P) 78   Temp (!) (P) 97.1 F (36.2 C) (Temporal)   Resp (P) 18   Ht (P) 5' 3 (1.6 m)   Wt (P) 195 lb 6 oz (88.6 kg)   BMI (P) 34.61 kg/m

## 2024-05-03 NOTE — Telephone Encounter (Signed)
 Pt c/o BP issue: STAT if pt c/o blurred vision, one-sided weakness or slurred speech.  STAT if BP is GREATER than 180/120 TODAY.  STAT if BP is LESS than 90/60 and SYMPTOMATIC TODAY  1. What is your BP concern? Hypotension   2. Have you taken any BP medication today?No   3. What are your last 5 BP readings? 104/37 on morning of sat 9/27. 174/89 today   4. Are you having any other symptoms (ex. Dizziness, headache, blurred vision, passed out)? Dizziness, states she got on the floor before she fell out.    Pt has been having low blood pressure, but is now off and medicine and having high readings too. Please

## 2024-05-03 NOTE — Patient Instructions (Addendum)
 Medication Instructions:  Your physician recommends that you continue on your current medications as directed. Please refer to the Current Medication list given to you today.  *If you need a refill on your cardiac medications before your next appointment, please call your pharmacy*  Lab Work: NONE ordered at this time of appointment    Testing/Procedures: Your physician has requested that you have an echocardiogram. Echocardiography is a painless test that uses sound waves to create images of your heart. It provides your doctor with information about the size and shape of your heart and how well your heart's chambers and valves are working. This procedure takes approximately one hour. There are no restrictions for this procedure. Please do NOT wear cologne, perfume, aftershave, or lotions (deodorant is allowed). Please arrive 15 minutes prior to your appointment time.  Please note: We ask at that you not bring children with you during ultrasound (echo/ vascular) testing. Due to room size and safety concerns, children are not allowed in the ultrasound rooms during exams. Our front office staff cannot provide observation of children in our lobby area while testing is being conducted. An adult accompanying a patient to their appointment will only be allowed in the ultrasound room at the discretion of the ultrasound technician under special circumstances. We apologize for any inconvenience.  Lower Extremity Doppler/Ultrasound   ZIO XT- Long Term Monitor Instructions  Your physician has requested you wear a ZIO patch monitor for 14 days.  This is a single patch monitor. [employer] supplies one patch monitor per enrollment. Additional stickers are not available. Please do not apply patch if you will be having a Nuclear Stress Test,  Echocardiogram, Cardiac CT, MRI, or Chest Xray during the period you would be wearing the  monitor. The patch cannot be worn during these tests. You cannot remove and  re-apply the  ZIO XT patch monitor.  Your ZIO patch monitor will be mailed 3 day USPS to your address on file. It may take 3-5 days  to receive your monitor after you have been enrolled.  Once you have received your monitor, please review the enclosed instructions. Your monitor  has already been registered assigning a specific monitor serial # to you.  Billing and Patient Assistance Program Information  We have supplied [employer] with any of your insurance information on file for billing purposes. [employer] offers a sliding scale Patient Assistance Program for patients that do not have  insurance, or whose insurance does not completely cover the cost of the ZIO monitor.  You must apply for the Patient Assistance Program to qualify for this discounted rate.  To apply, please call [employer] at [phone], select option 4, select option 2, ask to apply for  Patient Assistance Program. Meredeth will ask your household income, and how many people  are in your household. They will quote your out-of-pocket cost based on that information.  [employer] will also be able to set up a 41-month, interest-free payment plan if needed.  Applying the monitor   Shave hair from upper left chest.  Hold abrader disc by orange tab. Rub abrader in 40 strokes over the upper left chest as  indicated in your monitor instructions.  Clean area with 4 enclosed alcohol pads. Let dry.  Apply patch as indicated in monitor instructions. Patch will be placed under collarbone on left  side of chest with arrow pointing upward.  Rub patch adhesive wings for 2 minutes. Remove white label marked 1. Remove the white  label marked 2. Rub  patch adhesive wings for 2 additional minutes.  While looking in a mirror, press and release button in center of patch. A small green light will  flash 3-4 times. This will be your only indicator that the monitor has been turned on.  Do not shower for the first 24 hours. You may shower after the  first 24 hours.  Press the button if you feel a symptom. You will hear a small click. Record Date, Time and  Symptom in the Patient Logbook.  When you are ready to remove the patch, follow instructions on the last 2 pages of Patient  Logbook. Stick patch monitor onto the last page of Patient Logbook.  Place Patient Logbook in the blue and white box. Use locking tab on box and tape box closed  securely. The blue and white box has prepaid postage on it. Please place it in the mailbox as  soon as possible. Your physician should have your test results approximately 7 days after the  monitor has been mailed back to [facility].  Call [facility] Customer Care at [phone] if you have questions regarding  your ZIO XT patch monitor. Call them immediately if you see an orange light blinking on your  monitor.  If your monitor falls off in less than 4 days, contact our Monitor department at [phone].  If your monitor becomes loose or falls off after 4 days call [employer] at [phone] for  suggestions on securing your monitor   Follow-Up: At [facility], you and your health needs are our priority.  As part of our continuing mission to provide you with exceptional heart care, our providers are all part of one team.  This team includes your primary Cardiologist (physician) and Advanced Practice Providers or APPs (Physician Assistants and Nurse Practitioners) who all work together to provide you with the care you need, when you need it.  Your next appointment:   2-3 month(s)  Provider:   Redell Shallow, MD    We recommend signing up for the patient portal called MyChart.  Sign up information is provided on this After Visit Summary.  MyChart is used to connect with patients for Virtual Visits (Telemedicine).  Patients are able to view lab/test results, encounter notes, upcoming appointments, etc.  Non-urgent messages can be sent to your provider as well.   To learn more about  what you can do with MyChart, go to [email or web address].

## 2024-05-03 NOTE — Progress Notes (Signed)
   PROVIDER:  DONNICE DEWAYNE LIMA, MD  MRN: [id number] DOB: 01-04-1956 DATE OF ENCOUNTER: 05/03/2024 Interval History:   On 03/05/2024, we performed a right chest wall exploration with evacuation of the chest wall seroma. I placed a Penrose drain. We evacuated over 150 cc of clear yellow fluid. The seroma cavity measured approximately 9 x 5 cm. There was no sign of infection.  The drain was removed on 04/06/2024.  On 04/19/2024, the tunnel was only 0.5 cm deep.  The patient states that the wound has completely healed at this point.  Physical Examination:   Physical Exam   The right lateral breast scar is healed.  The scar is contracted.  No granulation tissue or drainage noted.  No sign of cellulitis.  The patient indicates a new area of tenderness and firmness in the right inframammary crease at 6:00.  I cannot palpate a distinct mass but there is a slight area of firmness in the inframammary crease.  No overlying skin changes.  Assessment and Plan:   [name] is a 68 y.o. female who underwent evacuation of right chest wall seroma on 03/05/2024.  Diagnoses and all orders for this visit:  Invasive ductal carcinoma of breast, female, right (CMS/HHS-HCC)  Seroma of breast     We will obtain an ultrasound of the right breast to evaluate this area of firmness in the inframammary crease.  This is likely scar tissue from radiation.  It is some distance away from the area of the surgical resection.  We will let her know of the results of the ultrasound.  Return in about 6 months (around 10/31/2024).   The plan was discussed in detail with the patient today, who expressed understanding.  The patient has my contact information, and understands to call me with any additional questions or concerns in the interval.  I would be happy to see the patient back sooner if the need arises.   MATTHEW KAI TSUEI, MD

## 2024-05-03 NOTE — Telephone Encounter (Signed)
 Spoke with pt. Pt states on Saturday while doing the dishes she became very light headed and almost passed out. Patient got herself to the floor to sit. Husband took BP 104/37 during this event. Pt is concerned with the sudden drop and near syncope. Appt available to be seen today. Pt was at another Dr appt while on the phone but told to speak with husband who confirmed the appointment time.Will forward to Damien Braver, NP as FYI

## 2024-05-03 NOTE — Progress Notes (Unsigned)
 Cardiology Office Note:    Date:  05/03/2024   ID:  [name], DOB [birth year], MRN [id number]  PCP:  Sara Almarie LABOR, MD   [facility] Providers Cardiologist:  Sara Shallow, MD { Click to update primary MD,subspecialty MD or APP then REFRESH:1}    Referring MD: [name], *   Chief complaint: Near-syncope  History of Present Illness:    [name] is a 68 y.o. female with a hx of HTN, HLD, TIA (06/2003), presenting to the clinic today complaining of an episode of near syncope with low BP (104/37) at home 3 days ago.  NM SPECT stress test on 06/19/2016 showed LVEF 55-65%, findings were consistent with ischemia secondary to a small defect of moderate severity present in the apical lateral region, low risk study.  Subsequent LHC on 06/28/2016 showed normal coronary arteries, no occlusive disease, normal LVEDP.  Patient seen for recurrent falls and questionable syncope 07/2020.  Cardiac event monitoring was unremarkable, underlying rhythm was sinus rhythm with periods of bradycardia and tachycardia, no arrhythmias to explain near syncope/falling episodes.  Echo on 08/14/2020 showed LVEF 55-60%, normal LV function, no RWMA, G1 DD, normal RV.  Normal MV, moderate sclerosis of AV valve, moderate thickening of AV valve, no aortic valve stenosis present.  Mild aortic dilatation observed of the ascending aorta measuring 40 mm.  RA pressure 3 mmHg.  Echo 08/29/2021 showed EF 60-65%, normal LV function, no RWMA, G1 DD.  Normal RV.  Trivial mitral valve regurgitation.  Mild calcification of aortic valve, no evidence of stenosis.  RA pressure 3 mmHg.  No aortic dilatation observed on echo.  Last seen in our office on 04/18/2023 by Dr. Shallow, only complaint at that time was mild DOE believed to be secondary to deconditioning and obesity hypoventilation syndrome, patient had previously declined evaluation for sleep apnea.  Presents to clinic appearing stable from a  cardiovascular standpoint.  Singular episode of near syncope occurring Saturday morning while cooking breakfast.  Felt a wave as though she were going to pass out, over her, felt nauseous, vomited once, laid on the ground, checked her blood pressure and found it to be 104/37, did not fully lose consciousness, no other associated symptoms.  She laid on the ground for 1 hour until symptoms resolved.  States she felt like this once in the past around July of this year and was found to have an infection of her right breast which had been previously treated with chemo, radiation, and multiple drainages.  The the infection ultimately required multiple rounds of antibiotics, led to sepsis, and led to surgical removal of the infected tissue/cyst earlier this month.  She did notice a new firm density to her right breast in a different area over the weekend that she has requested an ultrasound for at her previous visit with her surgeon and PCP earlier today. She stopped her losartan /hydrochlorothiazide  following that episode in July. Patient also notes that in July she had 1 episode of chest pain, shortness of breath, jaw pain, neck pain that radiated into her left arm that prompted her to call EMS, where she found she was in atrial fibrillation.  Patient states she was taken to the emergency department, stayed in atrial fib for 5 hours, was discharged home not on anticoagulation.  There is no documentation of this emergency department visit and her EMR that is visible to me.  Denies current chest pain, shortness of breath, DOE, orthopnea, palpitations, fast/irregular heartbeat, dizziness, lightheadedness, new episodes of  near syncope, headache, blurry vision, dark/tarry/bloody stools.  Reports since she finished radiation in July that her right leg has been more swollen than her left all the way up to her thigh, was on Tamoxifen that was started in April.   Past Medical History:  Diagnosis Date   Arthritis    neck,  knees, ankles, feet, back   Asthma, mild intermittent    prn inhaler   Breast cancer (HCC)    Cervical spondylosis    Chronic dyspnea    per pt gets winded w/ stairs, but recovers quickly   Diverticular disease of colon    followed by dr [name] (GI)---  chronic w/ chronic diarrhea   Essential hypertension    Fatty infiltration of liver    followed by dr [name]--- abd ultrasound in epic 08-31-2020   Frequency of urination    History of chest pain    evaluated by cardiologist-- dr pietro,  11/ 2017 nuclear stress test suggest ischemia,  06/2016 cardiac cath , showed normal coronary anatomy and lvf   History of COVID-19    per pt in 2019   treated with presumed covid had  mild to moderate symptoms that resolved   History of diverticulitis of colon    05/ 2022   History of radiation therapy    Right breast-12/30/23-01/27/24-Dr. Lynwood Nasuti   History of TIA (transient ischemic attack) 06/2003   Hyperlipidemia    Hypertension    Limitation of joint motion of neck    per pt due to bone spurs C2-5   Mild intermittent asthma    followed by pcp   PMB (postmenopausal bleeding)    Pre-diabetes    Pulmonary nodule    followed by pcp,  last hest CT in epic 04-21-2018   Stenosis of cervix    SUI (stress urinary incontinence, female)    Thickened endometrium    TMJ syndrome    Wears glasses     Past Surgical History:  Procedure Laterality Date   APPENDECTOMY  1979   BREAST ENHANCEMENT SURGERY Bilateral 1988   per pt implants removed 2002   BUNIONECTOMY Left 07/17/2007   @[facility]    BUNIONECTOMY Right 05/26/2014   Procedure: RIGHT FOOT LAPIDUS BUNION CORRECTION AMD MODIFIED MCBRIDE BUNIONECTOMY;  Surgeon: Norleen Armor, MD;  Location: [facility];  Service: Orthopedics;  Laterality: Right;   CARDIAC CATHETERIZATION N/A 06/28/2016   Procedure: Left Heart Cath and Coronary Angiography;  Surgeon: Peter M Swaziland, MD;  Location: [facility];  Service: Cardiovascular;   Laterality: N/A;   CESAREAN SECTION  1986   COLONOSCOPY  12/27/2020   by dr [name]   FINGER SURGERY Left 07/28/2015   @[facility]  :  left thumb knuckle collasped  arthroplasty   FOOT HARDWARE REMOVAL  04/25/2016   @[facility] ;   right foot   FOOT SURGERY Right 04/04/2015   @[facility] ;   first and second toe's   GANGLION CYST EXCISION Right 2016   wrist   HYSTEROSCOPY WITH D & C N/A 05/24/2021   Procedure: DILATATION AND CURETTAGE /HYSTEROSCOPY and MyoSure;  Surgeon: Rendell Calton LABOR, DO;  Location: [facility];  Service: Gynecology;  Laterality: N/A;   INCISION AND DRAINAGE, ABSCESS, BREAST Right 03/05/2024   Procedure: INCISION AND DRAINAGE, ABSCESS, BREAST;  Surgeon: Belinda Cough, MD;  Location: [facility];  Service: General;  Laterality: Right;   IRRIGATION AND DEBRIDEMENT HEMATOMA Right 03/05/2024   Procedure: IRRIGATION AND DEBRIDEMENT HEMATOMA;  Surgeon: Belinda Cough, MD;  Location: [facility];  Service: General;  Laterality: Right;  WOUND EXPLORATION RIGHT CHEST WALL   NASAL SEPTUM SURGERY  1981   RADIOACTIVE SEED GUIDED AXILLARY SENTINEL LYMPH NODE Right 11/27/2023   Procedure: RIGHT BREAST LUMPECTOMY AFTER MAGNETIC SEED LOCALIZATION AND SENTINEL LYMPH NODE BIOPSY;  Surgeon: Belinda Cough, MD;  Location: [facility];  Service: General;  Laterality: Right;  GEN w/PEC BLOCK RIGHT BREAST SEED LOCALIZED LUMPECTOMY WITH AXILLARY SENTINEL LYMPH NODE BIOPSY   ROTATOR CUFF REPAIR W/ DISTAL CLAVICLE EXCISION Left 08/18/2008   @[facility]  by dr amy   SHOULDER ARTHROSCOPY W/ LABRAL REPAIR Left 06/07/2008   @[facility] ;  and SAD  by dr amy   TONSILLECTOMY  1964   TUBAL LIGATION Bilateral 1999    Current Medications: Current Meds  Medication Sig   albuterol  (PROVENTIL ) (2.5 MG/3ML) 0.083% nebulizer solution Take 3 mLs (2.5 mg total) by nebulization every 6 (six) hours as needed for wheezing or shortness of breath.   albuterol  (VENTOLIN  HFA) 108  (90 Base) MCG/ACT inhaler Inhale 1-2 puffs into the lungs every 4 (four) hours as needed for wheezing or shortness of breath.   anastrozole  (ARIMIDEX ) 1 MG tablet Take 1 tablet (1 mg total) by mouth daily.   cetirizine  (ZYRTEC ) 10 MG tablet Take 1 tablet (10 mg total) by mouth daily.   citalopram  (CELEXA ) 10 MG tablet Take 1 tablet (10 mg total) by mouth daily.   fluticasone  (FLONASE ) 50 MCG/ACT nasal spray Place 2 sprays into both nostrils 2 (two) times daily as needed.   losartan -hydrochlorothiazide  (HYZAAR) 50-12.5 MG tablet Take 1 tablet by mouth daily.   montelukast  (SINGULAIR ) 10 MG tablet Take 1 tablet (10 mg total) by mouth at bedtime.   ondansetron  (ZOFRAN ) 8 MG tablet Take 1 tablet (8 mg total) by mouth every 8 (eight) hours as needed for nausea or vomiting.   REPATHA  SURECLICK 140 MG/ML SOAJ ADMINISTER 1 ML UNDER THE SKIN EVERY 14 DAYS   semaglutide -weight management (WEGOVY ) 1.7 MG/0.75ML SOAJ SQ injection Inject 1.7 mg into the skin once a week for 28 days.   [START ON 05/27/2024] semaglutide -weight management (WEGOVY ) 2.4 MG/0.75ML SOAJ SQ injection Inject 2.4 mg into the skin once a week.   tirzepatide  (ZEPBOUND ) 10 MG/0.5ML Pen Inject 10 mg into the skin once a week.   traMADol  (ULTRAM ) 50 MG tablet Take 1-2 tablets (50-100 mg total) by mouth every 6 (six) hours as needed for moderate pain (pain score 4-6) or severe pain (pain score 7-10).     Allergies:   Doxycycline , Hydrocodone  bit-homatrop mbr, Penicillins, Promethazine hcl, Clarithromycin, Statins, and Kiwi extract   Social History   Socioeconomic History   Marital status: Married    Spouse name: Not on file   Number of children: 2   Years of education: Not on file   Highest education level: Not on file  Occupational History   Not on file  Tobacco Use   Smoking status: Former    Current packs/day: 0.00    Types: Cigarettes    Start date: 09/13/1975    Quit date: 09/12/2005    Years since quitting: 18.6   Smokeless  tobacco: Never  Vaping Use   Vaping status: Never Used  Substance and Sexual Activity   Alcohol use: Yes    Comment: occais   Drug use: No   Sexual activity: Not Currently    Birth control/protection: Surgical    Comment: hyst  Other Topics Concern   Not on file  Social  History Narrative   Right hand   Lives with husband   One story home   Does work in [employer]   Drinks caffeine   Social Drivers of Health   Financial Resource Strain: Not on file  Food Insecurity: No Food Insecurity (11/19/2023)   Hunger Vital Sign    Worried About Running Out of Food in the Last Year: Never true    Ran Out of Food in the Last Year: Never true  Transportation Needs: No Transportation Needs (11/19/2023)   PRAPARE - Administrator, Civil Service (Medical): No    Lack of Transportation (Non-Medical): No  Physical Activity: Not on file  Stress: Not on file  Social Connections: Unknown (12/18/2021)   [facility]   Social Network    Social Network: Not on file     Family History: The patient's family history includes Atrial fibrillation in her father; Bone cancer (age of onset: 28) in her father; Colon cancer in her paternal aunt and paternal aunt; Colon cancer (age of onset: 65 - 29) in her cousin; Colon cancer (age of onset: 81 - 2) in her cousin; Colon cancer (age of onset: 80 - 36) in her paternal grandmother; Colon cancer (age of onset: 61) in her father; Colon polyps in her mother; Diabetes in her mother; Heart disease in her paternal aunt; Lung cancer (age of onset: 29) in her mother; Lung cancer (age of onset: 54) in her father; Lymphoma (age of onset: 84) in her brother; Prostate cancer in her father; Thalassemia in her maternal aunt, mother, and sister; Uterine cancer in her paternal aunt and paternal aunt. There is no history of Rectal cancer or Stomach cancer.  ROS:   Please see the history of present illness.    All other systems reviewed and are  negative.  EKGs/Labs/Other Studies Reviewed:    The following studies were reviewed today:       Recent Labs: 03/24/2024: ALT 11; BUN 17; Creatinine, Ser 1.03; Hemoglobin 13.3; Platelets 239.0; Potassium 4.0; Sodium 138  Recent Lipid Panel    Component Value Date/Time   CHOL 177 03/24/2024 0833   CHOL 135 11/23/2021 0851   CHOL 255 (H) 03/14/2015 0921   TRIG 190.0 (H) 03/24/2024 0833   TRIG 217 (H) 03/14/2015 0921   HDL 39.20 03/24/2024 0833   HDL 45 11/23/2021 0851   HDL 49 03/14/2015 0921   CHOLHDL 5 03/24/2024 0833   VLDL 38.0 03/24/2024 0833   LDLCALC 100 (H) 03/24/2024 0833   LDLCALC 61 11/23/2021 0851   LDLCALC 135 (H) 03/03/2020 0824   LDLCALC 163 (H) 03/14/2015 0921   LDLDIRECT 78.0 03/01/2019 1206    The patient's 1st BP is elevated (>139/89)*** Repeat BP and {Click to enter a 2nd BP Refresh Note  :1}         Physical Exam:    VS:  Ht 5' 3.5 (1.613 m)   Wt 193 lb (87.5 kg)   SpO2 97%   BMI 33.65 kg/m     Wt Readings from Last 3 Encounters:  05/03/24 193 lb (87.5 kg)  05/03/24 (P) 195 lb 6 oz (88.6 kg)  03/25/24 190 lb 4 oz (86.3 kg)     GEN:  Well nourished, well developed in no acute distress HEENT: Normal NECK: No carotid bruits CARDIAC: RRR, no murmurs, rubs, gallops RESPIRATORY:  Clear to auscultation without rales, wheezing or rhonchi  MUSCULOSKELETAL:  Right lower extremity appears slightly more swollen than left up to level  of thigh. No redness/warmth. 1+ non-pitting edema present in bilateral LE ankles. No deformity  SKIN: Warm and dry NEUROLOGIC:  Alert and oriented x 3 PSYCHIATRIC:  Normal affect   ASSESSMENT:    1. Near syncope   2. Primary hypertension   3. Hyperlipidemia, unspecified hyperlipidemia type   4. Leg swelling    PLAN:    In order of problems listed above:  Near-syncope EKG did not electronically transfer into chart, will be scanned in. Was in NSR in clinic Patient is currently asymptomatic Orthostatic vital  signs negative  Suspect this may be due to vasovagal syncope secondary to prolonged standing/dehydration  Will order 2 week zio monitor to evaluate for arrhythmias Will order an echo to check valvular status and assess for cardiomyopathies Recommending orthostatic precautions, including compression stockings/abdominal binder, increased fluid intake Recommending if her symptoms progress similarly to how they presented in July (rt breast swelling/redness, fever, drainage) that she will need to call her surgeon's office or proceed to the nearest ED for sepsis rule out.   Leg swelling Right leg appears mildly more swollen than left up to level of the thigh. Patient does have history of breast surgery over the last month.  Has received chemo and radiation over the last 6 months. Did receive Tamoxifen from April through July directly proceeding unilateral leg swelling Leads a sedentary lifestyle.  PERC positive and considered Moderate risk for DVT on the Well's criteria Will order duplex of bilateral LE to rule out DVT.  Questionable atrial fibrillation Given her recent surgery, sinus rhythm in office today, and patient report of one episode of possible atrial fibrillation in the past with no documentation in the medical record of an ER visit near that time visible in our EMR, will hold off on starting anticoagulation therapies until discussed with primary cardiologist and would require clearance from her surgeon.  Will order zio monitoring today  HLD 03/24/2024: Cholesterol 177, HLD 39, LDL 100, triglycerides 190 Continue repatha  140 mg every 2 weeks Patient self discontinued zetia .  Follow up in 2 months to go over testing results   Medication Adjustments/Labs and Tests Ordered: Current medicines are reviewed at length with the patient today.  Concerns regarding medicines are outlined above.  Orders Placed This Encounter  Procedures   LONG TERM MONITOR (3-14 DAYS)   EKG 12-Lead    ECHOCARDIOGRAM COMPLETE   VAS US  LOWER EXTREMITY VENOUS (DVT)   No orders of the defined types were placed in this encounter.   Patient Instructions  Medication Instructions:  Your physician recommends that you continue on your current medications as directed. Please refer to the Current Medication list given to you today.  *If you need a refill on your cardiac medications before your next appointment, please call your pharmacy*  Lab Work: NONE ordered at this time of appointment    Testing/Procedures: Your physician has requested that you have an echocardiogram. Echocardiography is a painless test that uses sound waves to create images of your heart. It provides your doctor with information about the size and shape of your heart and how well your heart's chambers and valves are working. This procedure takes approximately one hour. There are no restrictions for this procedure. Please do NOT wear cologne, perfume, aftershave, or lotions (deodorant is allowed). Please arrive 15 minutes prior to your appointment time.  Please note: We ask at that you not bring children with you during ultrasound (echo/ vascular) testing. Due to room size and safety concerns, children are not  allowed in the ultrasound rooms during exams. Our front office staff cannot provide observation of children in our lobby area while testing is being conducted. An adult accompanying a patient to their appointment will only be allowed in the ultrasound room at the discretion of the ultrasound technician under special circumstances. We apologize for any inconvenience.  Lower Extremity Doppler/Ultrasound   ZIO XT- Long Term Monitor Instructions  Your physician has requested you wear a ZIO patch monitor for 14 days.  This is a single patch monitor. [employer] supplies one patch monitor per enrollment. Additional stickers are not available. Please do not apply patch if you will be having a Nuclear Stress Test,  Echocardiogram,  Cardiac CT, MRI, or Chest Xray during the period you would be wearing the  monitor. The patch cannot be worn during these tests. You cannot remove and re-apply the  ZIO XT patch monitor.  Your ZIO patch monitor will be mailed 3 day USPS to your address on file. It may take 3-5 days  to receive your monitor after you have been enrolled.  Once you have received your monitor, please review the enclosed instructions. Your monitor  has already been registered assigning a specific monitor serial # to you.  Billing and Patient Assistance Program Information  We have supplied [employer] with any of your insurance information on file for billing purposes. [employer] offers a sliding scale Patient Assistance Program for patients that do not have  insurance, or whose insurance does not completely cover the cost of the ZIO monitor.  You must apply for the Patient Assistance Program to qualify for this discounted rate.  To apply, please call [employer] at [phone], select option 4, select option 2, ask to apply for  Patient Assistance Program. Meredeth will ask your household income, and how many people  are in your household. They will quote your out-of-pocket cost based on that information.  [employer] will also be able to set up a 61-month, interest-free payment plan if needed.  Applying the monitor   Shave hair from upper left chest.  Hold abrader disc by orange tab. Rub abrader in 40 strokes over the upper left chest as  indicated in your monitor instructions.  Clean area with 4 enclosed alcohol pads. Let dry.  Apply patch as indicated in monitor instructions. Patch will be placed under collarbone on left  side of chest with arrow pointing upward.  Rub patch adhesive wings for 2 minutes. Remove white label marked 1. Remove the white  label marked 2. Rub patch adhesive wings for 2 additional minutes.  While looking in a mirror, press and release button in center of patch. A small green light will   flash 3-4 times. This will be your only indicator that the monitor has been turned on.  Do not shower for the first 24 hours. You may shower after the first 24 hours.  Press the button if you feel a symptom. You will hear a small click. Record Date, Time and  Symptom in the Patient Logbook.  When you are ready to remove the patch, follow instructions on the last 2 pages of Patient  Logbook. Stick patch monitor onto the last page of Patient Logbook.  Place Patient Logbook in the blue and white box. Use locking tab on box and tape box closed  securely. The blue and white box has prepaid postage on it. Please place it in the mailbox as  soon as possible. Your physician should have your test results approximately 7 days  after the  monitor has been mailed back to [place].  Call [facility] Customer Care at [phone] if you have questions regarding  your ZIO XT patch monitor. Call them immediately if you see an orange light blinking on your  monitor.  If your monitor falls off in less than 4 days, contact our Monitor department at [phone].  If your monitor becomes loose or falls off after 4 days call [employer] at [phone] for  suggestions on securing your monitor   Follow-Up: At [facility], you and your health needs are our priority.  As part of our continuing mission to provide you with exceptional heart care, our providers are all part of one team.  This team includes your primary Cardiologist (physician) and Advanced Practice Providers or APPs (Physician Assistants and Nurse Practitioners) who all work together to provide you with the care you need, when you need it.  Your next appointment:   2-3 month(s)  Provider:   Redell Shallow, MD    We recommend signing up for the patient portal called MyChart.  Sign up information is provided on this After Visit Summary.  MyChart is used to connect with patients for Virtual Visits (Telemedicine).  Patients are able  to view lab/test results, encounter notes, upcoming appointments, etc.  Non-urgent messages can be sent to your provider as well.   To learn more about what you can do with MyChart, go to [email or web address].             Signed, Miriam FORBES Shams, NP  05/03/2024 6:53 PM    [facility]

## 2024-05-03 NOTE — Therapy (Signed)
 OUTPATIENT PHYSICAL THERAPY BREAST CANCER TREATMENT   Patient Name: [name] MRN: [id number] DOB:[birth year], 68 y.o., female Today's Date: 05/04/2024  END OF SESSION:  PT End of Session - 05/04/24 0758     Visit Number 9    Number of Visits 21    Date for Recertification  06/15/24    PT Start Time 0801    PT Stop Time 0848    PT Time Calculation (min) 47 min    Activity Tolerance Patient tolerated treatment well    Behavior During Therapy [facility] for tasks assessed/performed           Past Medical History:  Diagnosis Date   Arthritis    neck, knees, ankles, feet, back   Asthma, mild intermittent    prn inhaler   Breast cancer (HCC)    Cervical spondylosis    Chronic dyspnea    per pt gets winded w/ stairs, but recovers quickly   Diverticular disease of colon    followed by dr kristie (GI)---  chronic w/ chronic diarrhea   Essential hypertension    Fatty infiltration of liver    followed by dr kristie--- abd ultrasound in epic 08-31-2020   Frequency of urination    History of chest pain    evaluated by cardiologist-- dr pietro,  11/ 2017 nuclear stress test suggest ischemia,  06/2016 cardiac cath , showed normal coronary anatomy and lvf   History of COVID-19    per pt in 2019   treated with presumed covid had  mild to moderate symptoms that resolved   History of diverticulitis of colon    05/ 2022   History of radiation therapy    Right breast-12/30/23-01/27/24-Dr. Lynwood Nasuti   History of TIA (transient ischemic attack) 06/2003   Hyperlipidemia    Hypertension    Limitation of joint motion of neck    per pt due to bone spurs C2-5   Mild intermittent asthma    followed by pcp   PMB (postmenopausal bleeding)    Pre-diabetes    Pulmonary nodule    followed by pcp,  last hest CT in epic 04-21-2018   Stenosis of cervix    SUI (stress urinary incontinence, female)    Thickened endometrium    TMJ syndrome    Wears glasses    Past Surgical History:  Procedure  Laterality Date   APPENDECTOMY  1979   BREAST ENHANCEMENT SURGERY Bilateral 1988   per pt implants removed 2002   BUNIONECTOMY Left 07/17/2007   @[facility]    BUNIONECTOMY Right 05/26/2014   Procedure: RIGHT FOOT LAPIDUS BUNION CORRECTION AMD MODIFIED MCBRIDE BUNIONECTOMY;  Surgeon: Norleen Armor, MD;  Location: [facility];  Service: Orthopedics;  Laterality: Right;   CARDIAC CATHETERIZATION N/A 06/28/2016   Procedure: Left Heart Cath and Coronary Angiography;  Surgeon: Peter M Swaziland, MD;  Location: [facility] INVASIVE CV LAB;  Service: Cardiovascular;  Laterality: N/A;   CESAREAN SECTION  1986   COLONOSCOPY  12/27/2020   by dr kristie   FINGER SURGERY Left 07/28/2015   @[facility]  :  left thumb knuckle collasped  arthroplasty   FOOT HARDWARE REMOVAL  04/25/2016   @[facility] ;   right foot   FOOT SURGERY Right 04/04/2015   @[facility] ;   first and second toe's   GANGLION CYST EXCISION Right 2016   wrist   HYSTEROSCOPY WITH D & C N/A 05/24/2021   Procedure: DILATATION AND CURETTAGE /HYSTEROSCOPY and MyoSure;  Surgeon: Rendell Calton LABOR, DO;  Location: [facility];  Service: Gynecology;  Laterality: N/A;   INCISION AND DRAINAGE, ABSCESS, BREAST Right 03/05/2024   Procedure: INCISION AND DRAINAGE, ABSCESS, BREAST;  Surgeon: Belinda Cough, MD;  Location: [facility];  Service: General;  Laterality: Right;   IRRIGATION AND DEBRIDEMENT HEMATOMA Right 03/05/2024   Procedure: IRRIGATION AND DEBRIDEMENT HEMATOMA;  Surgeon: Belinda Cough, MD;  Location: [facility];  Service: General;  Laterality: Right;  WOUND EXPLORATION RIGHT CHEST WALL   NASAL SEPTUM SURGERY  1981   RADIOACTIVE SEED GUIDED AXILLARY SENTINEL LYMPH NODE Right 11/27/2023   Procedure: RIGHT BREAST LUMPECTOMY AFTER MAGNETIC SEED LOCALIZATION AND SENTINEL LYMPH NODE BIOPSY;  Surgeon: Belinda Cough, MD;  Location:  SURGERY CENTER;  Service: General;  Laterality: Right;  GEN w/PEC BLOCK RIGHT BREAST  SEED LOCALIZED LUMPECTOMY WITH AXILLARY SENTINEL LYMPH NODE BIOPSY   ROTATOR CUFF REPAIR W/ DISTAL CLAVICLE EXCISION Left 08/18/2008   @[facility]  by dr amy   SHOULDER ARTHROSCOPY W/ LABRAL REPAIR Left 06/07/2008   @[facility] ;  and SAD  by dr amy   TONSILLECTOMY  1964   TUBAL LIGATION Bilateral 1999   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   Spondylolisthesis of lumbar region 04/29/2017   Spondylosis without myelopathy or radiculopathy, cervical region 04/29/2017   Pain from implanted hardware 04/04/2016   Obesity 03/19/2016   Primary osteoarthritis of first carpometacarpal joint of left hand 07/13/2015   History of TIA (transient ischemic attack) 03/14/2015   Metatarsalgia of right foot 03/08/2015   Routine general medical examination at a health care facility 11/23/2012   Essential hypertension 06/16/2008   Hyperlipidemia 12/29/2007    PCP:   REFERRING PROVIDER: Lauraine Golden, MD  REFERRING DIAG: s/p Right Breast Cancer  THERAPY DIAG:  Malignant neoplasm of upper-outer quadrant of right breast in female, estrogen receptor positive (HCC)  Abnormal posture  Chronic right shoulder pain  Swelling of breast  Lymphedema, not elsewhere classified  Rationale for Evaluation and Treatment: Rehabilitation  ONSET DATE: 01/06/2024 SUBJECTIVE:  SUBJECTIVE STATEMENT: 05/04/2024 Re-EVAL I had the I and D and it was some better, but now I am being bothered by pain at the 5:00 position of my breast, not along the crease like the MD says. The MD said I don't need to wear my compression bra anymore. Pain in the axilla and breast is a 7/10. I can't raise my arm because the cording pulls down into the breast.  I am not having the swelling in the upper breast anymore, but now at 5:00 position I have swelling and firmness. It is tender and sore. I will get an US  on Oct 9.  02/16/2024 I started having severe left breast swelling and pain on Saturday. My breast looks like a [place]alope and it had been doing so much better. I had been lifting my 36 year old grandson. It hurts down my right UE into my wrist. I slept 29 hours yesterday, and I had a low grade fever yesterday and chills. I have been nauseated as well, and threw up one time. I called the nurse line and they suggested I go to the ED, but I didn't want to do that.   PERTINENT HISTORY:  Patient was diagnosed on 10/24/2023 with right grade 2 invasive ductal carcinoma breast cancer. It measures 9 mm and is located in the upper outer quadrant. It is ER/PR positive and HER2 negative with a Ki67 of 20%. She had a Right lumpectomy with SLNB on 11/27/2023 with 0+/2 LN's. She developed a seroma in the axilla and lateral breast and had 160 c of fluid drained on 12/08/2023.  5 days later they drained and got 120 cc,She had another 10 cc drained on 12/22/2023. She is presently undergoing radiation.She has a history of endometrial cancer in 2022 and a history of a TIA  05/04/2024; pt is now post radiation, last seen in therapy on 02/16/24 and advised to see MD for rapid increase in swelling and fever. On 02/18/2024 had 60 cc aspirated from seroma. The seroma re-accumulated and was aspirated of 150 cc with US  guidance. In a week it  had re-accumulated again and pt underwent I and D on 03/05/2024 and had a drain placed. Drain was removed on 04/06/2024. When MD saw pt pain had improved and wound was healed but she was feeling some firmness in the inferior breast that was going to be evaluated by US . She continues with complaints of cording in the axilla/upper arm.  PATIENT GOALS:  Reassess how my recovery is going related to arm function, pain, and swelling.  PAIN:  Are you having pain? PAIN:  Are you having pain? Yes NPRS scale: 7/10 right breast/arm cording Pain location: Right breast, axilla, upper arm, lower foreaem Pain orientation: Right  PAIN TYPE: tight Pain description: intermittent in arm, breast is constant Aggravating factors: raising arm, carrying brief case, reaching for dishes Relieving factors: nothing    PRECAUTIONS: Recent Surgery, right UE Lymphedema risk, Prior TIA  RED FLAGS: None   ACTIVITY LEVEL / LEISURE: walking   OBJECTIVE:   PATIENT SURVEYS:  QUICK DASH: EVAL 31.82,  05/04/2024; 31.82 BREAST COMPLAINTS QUESTIONNAIRE (9/30) Pain:7 Heaviness: 7 Swollen feeling: 7 Tense Skin: 7 Redness: 7 Bra Print: 0 Size of Pores: 3 Hard feeling: 7 Total:     45/80 A Score over 9 indicates lymphedema issues in the breast   OBSERVATIONS: EVAL: Large area of swelling with induration right upper breast.(05/04/2024)  Large indention noted at lateral breast (see photo in media) from where drain was in  after I and D. Fibrosis noted inferior medial right breast with tenderness at lower breast above fibrosis  POSTURE:  Forward head and rounded shoulders posture    LYMPHEDEMA ASSESSMENT:   UPPER EXTREMITY AROM/PROM:   A/PROM RIGHT   eval   RIGHT 01/13/2024 RIGHT 05/04/2024  Shoulder extension 57 45 42  Shoulder flexion 160 140 125 with pulling  Shoulder abduction 169 115 tight under arm 90 with pulling  Shoulder internal rotation 67 45   Shoulder external rotation 82 60                            (Blank rows = not tested)   A/PROM LEFT   eval  Shoulder extension 48  Shoulder flexion 137  Shoulder abduction 168  Shoulder internal rotation 57  Shoulder external rotation 75                          (Blank rows = not tested)   CERVICAL AROM: All within normal limits   UPPER EXTREMITY STRENGTH: WNL   LYMPHEDEMA ASSESSMENTS (in cm):    LANDMARK RIGHT   eval RIGHT 01/13/2024  10 cm proximal to olecranon process 32.9 33.8  Olecranon process 25 24.7  10 cm proximal to ulnar styloid process 24.5 23.0  Just proximal to ulnar styloid process 16.9 17.4  Across hand at thumb web space 19.5 19.6  At base of 2nd digit 6.6 6.3  (Blank rows = not tested)   LANDMARK LEFT   eval LEFT 01/13/2024  10 cm proximal to olecranon process 35 34.6  Olecranon process 25.3 25.5  10 cm proximal to ulnar styloid process 23 22.7  Just proximal to ulnar styloid process 16.4 16.5  Across hand at thumb web space 18.1 18.7  At base of 2nd digit 6.5 6.4  (Blank rows = not tested)    Surgery type/Date: right breast lumpectomy and sentinel lymph node biopsy on 11/27/2023.  Number of lymph nodes removed: 0+/2 Current/past treatment (chemo, radiation, hormone therapy): radiation Other symptoms:  Heaviness/tightness No Pain Yes Pitting edema No Infections No, 05/04/2024 Yes; left Breast treated for initially, then antibiotics held when aspirations occurred Decreased scar mobility Yes Stemmer sign No   TODAY'S TREATMENT: 05/04/2024 Measured ROM and assessed right breast for Re-eval Cording release to right axilla/breast in supine. Pt performed AA flexion x 5 and Stargazer stretch x 5, wall slide x 5 to resume exercises and stretch cords. PROM performed by PT;flexion, abd, IR and ER per pt tolerance Foam pack, and chip pack made for pt to try at inferior right breast to place in compression bra when she gets home.   02/16/2024 Right breast with significant increase in swelling, mild redness.  Pulling down arm and tightness with arm pain consistent with cording. Pt advised not able to be seen today until evaluated due to concerns of infection given rapid swelling increase, fever etc. Pt is going to call cancer center and see MD today. Advised pt to contact me and let me know how she is.   02/11/24: Manual Therapy MLD to Rt breast in supine: Short neck, superficial and deep abdominals, Lt axillary and pectoral nodes, anterior intact thorax sequence and anterior inter-axillary anastomosis, then Rt inguinal nodes, Rt axillo-inguinal anastomosis and then focused on Rt medial and superior breast, then in Supine and later into Lt S/L for lateral and inferior breast redirecting towards lateral anastomosis. Unable to finishing retracing  steps in supine today as pt reported near end of session she had to leave for an MD appt.  MFR to right axillary region, upper arm and forearm areas of cording, pt still with mod-max tenderness along cording in axilla PROM right shoulder flexion, scaption, and D2 with scapular depression by therapist throughout   02/09/2024 MFR to right axillary region, upper arm and forearm areas of cording PROM right shoulder flexion, scaption, IR and ER. MLD to Rt breast in supine: Short neck, 5 Breaths, Lt axillary  nodes,  and anterior inter-axillary anastomosis, then Rt inguinal nodes, Rt axillo-inguinal anastomosis and then focused on Rt medial and superior breast, then in Supine and later into Lt S/L for lateral and inferior breast redirecting towards lateral anastomosis then finished retracing all steps in supine ending with LN's. Therapist demonstrated all steps first and with VC's and hand over hand cueing therapist had pt perform all steps. Gave pt illustrated and written instructions, and showed pt poster in my room demonstrating how superficial lymphatics are so the need for gentle stretch, without a lot of pressure.  02/02/2024  MLD to Rt breast in supine: Short neck,  superficial and deep abdominals, Lt axillary and pectoral nodes,  and anterior inter-axillary anastomosis, then Rt inguinal nodes, Rt axillo-inguinal anastomosis and then focused on Rt medial and superior breast, then into Lt S/L for lateral and inferior breast redirecting towards lateral anastomosis then finished retracing all steps in supine P/ROM to Rt shoulder in supine during MLD into flex, abd and D2 , IR and ER to pts tolerance and scapular depression by therapist throughout   01/21/2024 Treatment decreased due to pt late from radiation boost MLD to Rt breast in supine: Short neck, superficial and deep abdominals, Lt axillary and pectoral nodes,  and anterior inter-axillary anastomosis, then Rt inguinal nodes, Rt axillo-inguinal anastomosis and then focused on Rt medial and superior breast, then into Lt S/L for lateral and inferior breast redirecting towards lateral anastomosis then finished retracing all steps in supine P/ROM to Rt shoulder in supine during MLD into flex, abd and D2 to pts tolerance and scapular depression by therapist throughout    01/19/24: Manual Therapy MLD to Rt breast in supine: Short neck, superficial and deep abdominals, Lt axillary and pectoral nodes, anterior intact thorax sequence, and anterior inter-axillary anastomosis, then Rt inguinal nodes, Rt axillo-inguinal anastomosis and then focused on Rt medial and superior breast, then into Lt S/L for lateral and inferior breast redirecting towards lateral anastomosis then finished retracing all steps in supine P/ROM to Rt shoulder in supine during MLD into flex, abd and D2 to pts tolerance and scapular depression by therapist throughout  MFR to multiple cords in Rt axilla and as pt could tolerate, she is very tender to touch in medial upper arm over cording Therapeutic Exercises Pulleys into flex x 2 mins, VC's to remind pt to decrease scap compensation Roll yellow ball up wall into flex x 7, pt with no shoulder  discomfort with this today     PATIENT EDUCATION:  Education details: POC, LOS, treatment interventions, 4 post op exercises, NTS for cords Person educated: Patient Education method: Programmer, multimedia, Demonstration, Verbal cues, and Handouts Education comprehension: verbalized understanding, returned demonstration, and verbal cues required  HOME EXERCISE PROGRAM: Instructed 4 post op exercises with pt holding Right wrist with left hand for AA flexion in supine, supine stargazer, scapular retraction, wall slides. Showed wrist extension with elbow ext and gentle shoulder ext to stretch cords. Advised to do  with appropriate tightness only and not to force. Should not have pain in shoulder. To perform 2x's per day. Also educated in scar massage to incision area, and advised to watch ABC video.   ASSESSMENT:  CLINICAL IMPRESSION:  Pt comes in for Re-eval after having several aspirations for a recurrent seroma which kept re-accumulating. Due to re-accumulation she had an I and D with a drain put in for nearly a month. Prior area of swelling is improved although she is noted to have a large indentation in the outer breast. (See Media photo). She continues with painful cording in the axilla and running into the breast. ROM is more  limited today as pt hasn't been performing her exercises due to the drain. We started back to gentle post op exercises today which pt reported felt really good. She will resume her compression bra and will try foam 1st and later chip pack , (foam and chip pack)  in the inferior breast area that is sore. She will benefit from skilled PT to address deficits and return to PLOF.  Pt will benefit from skilled therapeutic intervention to improve on the following deficits: Decreased knowledge of precautions, impaired UE functional use, pain, decreased ROM, postural dysfunction.   PT treatment/interventions: ADL/Self care home management, [phone]- PT Re-evaluation, [phone]- Physical  Performance Testing, 97110-Therapeutic exercises, 97530- Therapeutic activity, [id number]- Neuromuscular re-education, 97535- Self Care, 02859- Manual therapy, 97760- Orthotic Initial, [id number]- Orthotic/Prosthetic subsequent, and Patient/Family education   GOALS: Goals reviewed with patient? Yes  LONG TERM GOALS:  (STG=LTG)  GOALS Name Target Date  Goal status  1 Pt will demonstrate she has regained full shoulder ROM and function post operatively compared to baselines.  Baseline: 02/24/2024 INITIAL  2 Pt will be independent in Right shoulder ROM and strengthening 02/24/2024 06/15/2024 INITIAL  3 Pt will be independent in right breast MLD to decrease breast swelling 02/24/2024 06/15/2024 INITIAL  4 Pt will be compliant with compression bra to decrease right breast swelling 02/24/2024 06/15/2024 INITIAL  5. Quick dash will be no greater than 15% to demonstrate improved UE function  06/15/2024 NEW     PLAN:  PT FREQUENCY/DURATION: 2x/week x 6 weeks  PLAN FOR NEXT SESSION: check  skin, pulleys, wand, try foam pads? HEP and progress AA/A/ROM stretches, supine over half foam roll?,  Cont and review MLD and cont MFR to cords,    [facility]  [address], [address]  [place] [address]  [phone]   Grayce JINNY Sheldon, PT 05/04/2024, 3:56 PM

## 2024-05-04 ENCOUNTER — Ambulatory Visit: Attending: Emergency Medicine

## 2024-05-04 ENCOUNTER — Ambulatory Visit: Attending: Radiation Oncology

## 2024-05-04 DIAGNOSIS — M25511 Pain in right shoulder: Secondary | ICD-10-CM | POA: Insufficient documentation

## 2024-05-04 DIAGNOSIS — Z170 Estrogen receptor positive status [ER+]: Secondary | ICD-10-CM | POA: Diagnosis present

## 2024-05-04 DIAGNOSIS — G8929 Other chronic pain: Secondary | ICD-10-CM | POA: Insufficient documentation

## 2024-05-04 DIAGNOSIS — I890 Lymphedema, not elsewhere classified: Secondary | ICD-10-CM | POA: Diagnosis present

## 2024-05-04 DIAGNOSIS — R293 Abnormal posture: Secondary | ICD-10-CM | POA: Diagnosis present

## 2024-05-04 DIAGNOSIS — N63 Unspecified lump in unspecified breast: Secondary | ICD-10-CM | POA: Diagnosis present

## 2024-05-04 DIAGNOSIS — C50411 Malignant neoplasm of upper-outer quadrant of right female breast: Secondary | ICD-10-CM | POA: Insufficient documentation

## 2024-05-04 DIAGNOSIS — R55 Syncope and collapse: Secondary | ICD-10-CM

## 2024-05-04 NOTE — Progress Notes (Unsigned)
 Enrolled for [employer] to mail a ZIO XT long term holter monitor to the patients address on file.   Dr. Jens Som to read.

## 2024-05-05 ENCOUNTER — Ambulatory Visit (HOSPITAL_BASED_OUTPATIENT_CLINIC_OR_DEPARTMENT_OTHER)
Admission: RE | Admit: 2024-05-05 | Discharge: 2024-05-05 | Disposition: A | Discharge status: Home / Self Care | Source: Ambulatory Visit | Attending: Cardiovascular Disease | Admitting: Cardiovascular Disease

## 2024-05-05 ENCOUNTER — Ambulatory Visit (HOSPITAL_COMMUNITY)
Admission: RE | Admit: 2024-05-05 | Discharge: 2024-05-05 | Disposition: A | Discharge status: Home / Self Care | Source: Ambulatory Visit | Attending: Emergency Medicine | Admitting: Emergency Medicine

## 2024-05-05 ENCOUNTER — Ambulatory Visit: Payer: Self-pay | Admitting: Emergency Medicine

## 2024-05-05 DIAGNOSIS — R55 Syncope and collapse: Secondary | ICD-10-CM | POA: Diagnosis present

## 2024-05-05 DIAGNOSIS — R600 Localized edema: Secondary | ICD-10-CM | POA: Insufficient documentation

## 2024-05-06 ENCOUNTER — Other Ambulatory Visit: Payer: Self-pay | Admitting: Internal Medicine

## 2024-05-06 ENCOUNTER — Ambulatory Visit: Attending: Radiation Oncology

## 2024-05-06 DIAGNOSIS — M25511 Pain in right shoulder: Secondary | ICD-10-CM | POA: Insufficient documentation

## 2024-05-06 DIAGNOSIS — I890 Lymphedema, not elsewhere classified: Secondary | ICD-10-CM | POA: Diagnosis present

## 2024-05-06 DIAGNOSIS — C50411 Malignant neoplasm of upper-outer quadrant of right female breast: Secondary | ICD-10-CM | POA: Diagnosis present

## 2024-05-06 DIAGNOSIS — R293 Abnormal posture: Secondary | ICD-10-CM | POA: Insufficient documentation

## 2024-05-06 DIAGNOSIS — N63 Unspecified lump in unspecified breast: Secondary | ICD-10-CM | POA: Insufficient documentation

## 2024-05-06 DIAGNOSIS — G8929 Other chronic pain: Secondary | ICD-10-CM | POA: Insufficient documentation

## 2024-05-06 DIAGNOSIS — Z170 Estrogen receptor positive status [ER+]: Secondary | ICD-10-CM | POA: Diagnosis present

## 2024-05-06 LAB — ECHOCARDIOGRAM COMPLETE
Area-P 1/2: 4.83 cm2
S' Lateral: 2.8 cm

## 2024-05-06 NOTE — Therapy (Signed)
 OUTPATIENT PHYSICAL THERAPY BREAST CANCER TREATMENT   Patient Name: [name] MRN: [id number] DOB:[birth year], 68 y.o., female Today's Date: 05/06/2024  END OF SESSION:  PT End of Session - 05/06/24 0752     Visit Number 10    Number of Visits 21    Date for Recertification  06/15/24    PT Start Time 0755    PT Stop Time 0845    PT Time Calculation (min) 50 min    Activity Tolerance Patient tolerated treatment well    Behavior During Therapy [facility] for tasks assessed/performed           Past Medical History:  Diagnosis Date   Arthritis    neck, knees, ankles, feet, back   Asthma, mild intermittent    prn inhaler   Breast cancer (HCC)    Cervical spondylosis    Chronic dyspnea    per pt gets winded w/ stairs, but recovers quickly   Diverticular disease of colon    followed by dr kristie (GI)---  chronic w/ chronic diarrhea   Essential hypertension    Fatty infiltration of liver    followed by dr kristie--- abd ultrasound in epic 08-31-2020   Frequency of urination    History of chest pain    evaluated by cardiologist-- dr pietro,  11/ 2017 nuclear stress test suggest ischemia,  06/2016 cardiac cath , showed normal coronary anatomy and lvf   History of COVID-19    per pt in 2019   treated with presumed covid had  mild to moderate symptoms that resolved   History of diverticulitis of colon    05/ 2022   History of radiation therapy    Right breast-12/30/23-01/27/24-Dr. Lynwood Nasuti   History of TIA (transient ischemic attack) 06/2003   Hyperlipidemia    Hypertension    Limitation of joint motion of neck    per pt due to bone spurs C2-5   Mild intermittent asthma    followed by pcp   PMB (postmenopausal bleeding)    Pre-diabetes    Pulmonary nodule    followed by pcp,  last hest CT in epic 04-21-2018   Stenosis of cervix    SUI (stress urinary incontinence, female)    Thickened endometrium    TMJ syndrome    Wears glasses    Past Surgical History:   Procedure Laterality Date   APPENDECTOMY  1979   BREAST ENHANCEMENT SURGERY Bilateral 1988   per pt implants removed 2002   BUNIONECTOMY Left 07/17/2007   @[facility]    BUNIONECTOMY Right 05/26/2014   Procedure: RIGHT FOOT LAPIDUS BUNION CORRECTION AMD MODIFIED MCBRIDE BUNIONECTOMY;  Surgeon: Norleen Armor, MD;  Location: [facility];  Service: Orthopedics;  Laterality: Right;   CARDIAC CATHETERIZATION N/A 06/28/2016   Procedure: Left Heart Cath and Coronary Angiography;  Surgeon: Peter M Swaziland, MD;  Location: [facility];  Service: Cardiovascular;  Laterality: N/A;   CESAREAN SECTION  1986   COLONOSCOPY  12/27/2020   by dr kristie   FINGER SURGERY Left 07/28/2015   @[facility]  :  left thumb knuckle collasped  arthroplasty   FOOT HARDWARE REMOVAL  04/25/2016   @[facility] ;   right foot   FOOT SURGERY Right 04/04/2015   @[facility] ;   first and second toe's   GANGLION CYST EXCISION Right 2016   wrist   HYSTEROSCOPY WITH D & C N/A 05/24/2021   Procedure: DILATATION AND CURETTAGE /HYSTEROSCOPY and MyoSure;  Surgeon: Rendell Calton LABOR, DO;  Location: [facility];  Service: Gynecology;  Laterality: N/A;   INCISION AND DRAINAGE, ABSCESS, BREAST Right 03/05/2024   Procedure: INCISION AND DRAINAGE, ABSCESS, BREAST;  Surgeon: Belinda Cough, MD;  Location: [facility];  Service: General;  Laterality: Right;   IRRIGATION AND DEBRIDEMENT HEMATOMA Right 03/05/2024   Procedure: IRRIGATION AND DEBRIDEMENT HEMATOMA;  Surgeon: Belinda Cough, MD;  Location: [facility];  Service: General;  Laterality: Right;  WOUND EXPLORATION RIGHT CHEST WALL   NASAL SEPTUM SURGERY  1981   RADIOACTIVE SEED GUIDED AXILLARY SENTINEL LYMPH NODE Right 11/27/2023   Procedure: RIGHT BREAST LUMPECTOMY AFTER MAGNETIC SEED LOCALIZATION AND SENTINEL LYMPH NODE BIOPSY;  Surgeon: Belinda Cough, MD;  Location:  SURGERY CENTER;  Service: General;  Laterality: Right;  GEN w/PEC  BLOCK RIGHT BREAST SEED LOCALIZED LUMPECTOMY WITH AXILLARY SENTINEL LYMPH NODE BIOPSY   ROTATOR CUFF REPAIR W/ DISTAL CLAVICLE EXCISION Left 08/18/2008   @[facility]  by dr amy   SHOULDER ARTHROSCOPY W/ LABRAL REPAIR Left 06/07/2008   @[facility] ;  and SAD  by dr amy   TONSILLECTOMY  1964   TUBAL LIGATION Bilateral 1999   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   Spondylolisthesis of lumbar region 04/29/2017   Spondylosis without myelopathy or radiculopathy, cervical region 04/29/2017   Pain from implanted hardware 04/04/2016   Obesity 03/19/2016   Primary osteoarthritis of first carpometacarpal joint of left hand 07/13/2015   History of TIA (transient ischemic attack) 03/14/2015   Metatarsalgia of right foot 03/08/2015   Routine general medical examination at a health care facility 11/23/2012   Essential hypertension 06/16/2008   Hyperlipidemia 12/29/2007    PCP:   REFERRING PROVIDER: Lauraine Golden, MD  REFERRING DIAG: s/p Right Breast Cancer  THERAPY DIAG:  Malignant neoplasm of upper-outer quadrant of right breast in female, estrogen receptor positive (HCC)  Abnormal posture  Chronic right shoulder pain  Swelling of breast  Lymphedema, not elsewhere classified  Rationale for Evaluation and Treatment: Rehabilitation  ONSET DATE: 01/06/2024 SUBJECTIVE:  SUBJECTIVE STATEMENT:  05/06/2024  I really like the gray foam pad. It eases some of the pressure. The compression bra feels good and it doesn't hurt to walk with it on like it did when I wasn't wearing it. I did the exercises while I was having the doppler on my legs. I walked the dog this am and he really pulled on me.  05/04/2024 Re-EVAL I had the I and D and it was some better, but now I am being bothered by pain at the 5:00 position of my breast, not along the crease like the MD says. The MD said I don't need to wear my compression bra anymore. Pain in the axilla and breast is a 7/10. I can't raise my arm because the cording pulls down into the breast.  I am not having the swelling in the upper breast anymore, but now at 5:00 position I have swelling and firmness. It is tender and sore. I will get an US  on Oct 9.  02/16/2024 I started having severe left breast swelling and pain on Saturday. My breast looks like a Cantalope and it had been doing so much better. I had been lifting my 50 year old grandson. It hurts down my right UE into my wrist. I slept 29 hours yesterday, and I had a low grade fever yesterday and chills. I have been nauseated as well, and threw up one time. I called the nurse line and they suggested I go to the ED, but I didn't want to do that.   PERTINENT HISTORY:  Patient was diagnosed on 10/24/2023 with right grade 2 invasive ductal carcinoma breast cancer. It measures 9 mm and is located in the upper outer quadrant. It is ER/PR positive and HER2 negative with a Ki67 of 20%. She had a Right lumpectomy with SLNB on 11/27/2023 with 0+/2 LN's. She developed a seroma in the axilla and lateral breast and had 160 c of fluid drained on 12/08/2023.  5 days later they drained and got 120 cc,She had another 10 cc drained on 12/22/2023. She is presently undergoing radiation.She has a  history of endometrial cancer in 2022 and a history of a TIA  05/04/2024; pt is now post radiation, last seen in therapy on 02/16/24 and advised to see MD for rapid increase in swelling and fever. On 02/18/2024 had 60 cc aspirated from seroma. The seroma re-accumulated and was aspirated of 150 cc with US  guidance. In a week it had re-accumulated again and pt underwent I and D on 03/05/2024 and had a drain placed. Drain was removed on 04/06/2024. When MD saw pt pain had improved and wound was healed but she was feeling some firmness in the inferior breast that was going to be evaluated by US . She continues with complaints of cording in the axilla/upper arm.  PATIENT GOALS:  Reassess how my recovery is going related to arm function, pain, and swelling.  PAIN:  Are you having pain? PAIN:  Are you having pain? Yes NPRS scale: 3/10 right breast/arm cording Pain location: Right breast, axilla, upper arm, lower foreaem Pain orientation: Right  PAIN TYPE: tight Pain description: intermittent in arm, breast is constant Aggravating factors: raising arm, carrying brief case, reaching for dishes Relieving factors: nothing    PRECAUTIONS: Recent Surgery, right UE Lymphedema risk, Prior TIA  RED FLAGS: None   ACTIVITY LEVEL / LEISURE: walking   OBJECTIVE:   PATIENT SURVEYS:  QUICK DASH: EVAL 31.82,  05/04/2024; 31.82 BREAST COMPLAINTS QUESTIONNAIRE (9/30) Pain:7 Heaviness:  7 Swollen feeling: 7 Tense Skin: 7 Redness: 7 Bra Print: 0 Size of Pores: 3 Hard feeling: 7 Total:     45/80 A Score over 9 indicates lymphedema issues in the breast   OBSERVATIONS: EVAL: Large area of swelling with induration right upper breast.(05/04/2024)  Large indention noted at lateral breast (see photo in media) from where drain was in after I and D. Fibrosis noted inferior medial right breast with tenderness at lower breast above fibrosis  POSTURE:  Forward head and rounded shoulders posture    LYMPHEDEMA  ASSESSMENT:   UPPER EXTREMITY AROM/PROM:   A/PROM RIGHT   eval   RIGHT 01/13/2024 RIGHT 05/04/2024  Shoulder extension 57 45 42  Shoulder flexion 160 140 125 with pulling  Shoulder abduction 169 115 tight under arm 90 with pulling  Shoulder internal rotation 67 45   Shoulder external rotation 82 60                           (Blank rows = not tested)   A/PROM LEFT   eval  Shoulder extension 48  Shoulder flexion 137  Shoulder abduction 168  Shoulder internal rotation 57  Shoulder external rotation 75                          (Blank rows = not tested)   CERVICAL AROM: All within normal limits   UPPER EXTREMITY STRENGTH: WNL   LYMPHEDEMA ASSESSMENTS (in cm):    LANDMARK RIGHT   eval RIGHT 01/13/2024  10 cm proximal to olecranon process 32.9 33.8  Olecranon process 25 24.7  10 cm proximal to ulnar styloid process 24.5 23.0  Just proximal to ulnar styloid process 16.9 17.4  Across hand at thumb web space 19.5 19.6  At base of 2nd digit 6.6 6.3  (Blank rows = not tested)   LANDMARK LEFT   eval LEFT 01/13/2024  10 cm proximal to olecranon process 35 34.6  Olecranon process 25.3 25.5  10 cm proximal to ulnar styloid process 23 22.7  Just proximal to ulnar styloid process 16.4 16.5  Across hand at thumb web space 18.1 18.7  At base of 2nd digit 6.5 6.4  (Blank rows = not tested)    Surgery type/Date: right breast lumpectomy and sentinel lymph node biopsy on 11/27/2023.  Number of lymph nodes removed: 0+/2 Current/past treatment (chemo, radiation, hormone therapy): radiation Other symptoms:  Heaviness/tightness No Pain Yes Pitting edema No Infections No, 05/04/2024 Yes; left Breast treated for initially, then antibiotics held when aspirations occurred Decreased scar mobility Yes Stemmer sign No   TODAY'S TREATMENT: 05/06/2024  Pulleys flex and abd x 2 min ea, VC to depress scapula Yellow ball rolls x 5 flexion and abd Standing lat stretch at laundry cart 3 x 20  sec Cording release to right axilla/breast in supine STM to right UT, pectorals and lateral trunk with cocoa butter Dynamic cupping to right upper arm area of cording with 2nd largest clear cup using cocoa butter for glide PROM Right shoulder flexion, abduction, IR and ER   05/04/2024 Measured ROM and assessed right breast for Re-eval Cording release to right axilla/breast in supine. Pt performed AA flexion x 5 and Stargazer stretch x 5, wall slide x 5 to resume exercises and stretch cords. PROM performed by PT;flexion, abd, IR and ER per pt tolerance Foam pack, and chip pack made for pt to try at inferior right breast  to place in compression bra when she gets home.   02/16/2024 Right breast with significant increase in swelling, mild redness. Pulling down arm and tightness with arm pain consistent with cording. Pt advised not able to be seen today until evaluated due to concerns of infection given rapid swelling increase, fever etc. Pt is going to call cancer center and see MD today. Advised pt to contact me and let me know how she is.   02/11/24: Manual Therapy MLD to Rt breast in supine: Short neck, superficial and deep abdominals, Lt axillary and pectoral nodes, anterior intact thorax sequence and anterior inter-axillary anastomosis, then Rt inguinal nodes, Rt axillo-inguinal anastomosis and then focused on Rt medial and superior breast, then in Supine and later into Lt S/L for lateral and inferior breast redirecting towards lateral anastomosis. Unable to finishing retracing steps in supine today as pt reported near end of session she had to leave for an MD appt.  MFR to right axillary region, upper arm and forearm areas of cording, pt still with mod-max tenderness along cording in axilla PROM right shoulder flexion, scaption, and D2 with scapular depression by therapist throughout   02/09/2024 MFR to right axillary region, upper arm and forearm areas of cording PROM right shoulder flexion,  scaption, IR and ER. MLD to Rt breast in supine: Short neck, 5 Breaths, Lt axillary  nodes,  and anterior inter-axillary anastomosis, then Rt inguinal nodes, Rt axillo-inguinal anastomosis and then focused on Rt medial and superior breast, then in Supine and later into Lt S/L for lateral and inferior breast redirecting towards lateral anastomosis then finished retracing all steps in supine ending with LN's. Therapist demonstrated all steps first and with VC's and hand over hand cueing therapist had pt perform all steps. Gave pt illustrated and written instructions, and showed pt poster in my room demonstrating how superficial lymphatics are so the need for gentle stretch, without a lot of pressure.  02/02/2024  MLD to Rt breast in supine: Short neck, superficial and deep abdominals, Lt axillary and pectoral nodes,  and anterior inter-axillary anastomosis, then Rt inguinal nodes, Rt axillo-inguinal anastomosis and then focused on Rt medial and superior breast, then into Lt S/L for lateral and inferior breast redirecting towards lateral anastomosis then finished retracing all steps in supine P/ROM to Rt shoulder in supine during MLD into flex, abd and D2 , IR and ER to pts tolerance and scapular depression by therapist throughout   01/21/2024 Treatment decreased due to pt late from radiation boost MLD to Rt breast in supine: Short neck, superficial and deep abdominals, Lt axillary and pectoral nodes,  and anterior inter-axillary anastomosis, then Rt inguinal nodes, Rt axillo-inguinal anastomosis and then focused on Rt medial and superior breast, then into Lt S/L for lateral and inferior breast redirecting towards lateral anastomosis then finished retracing all steps in supine P/ROM to Rt shoulder in supine during MLD into flex, abd and D2 to pts tolerance and scapular depression by therapist throughout    01/19/24: Manual Therapy MLD to Rt breast in supine: Short neck, superficial and deep abdominals, Lt  axillary and pectoral nodes, anterior intact thorax sequence, and anterior inter-axillary anastomosis, then Rt inguinal nodes, Rt axillo-inguinal anastomosis and then focused on Rt medial and superior breast, then into Lt S/L for lateral and inferior breast redirecting towards lateral anastomosis then finished retracing all steps in supine P/ROM to Rt shoulder in supine during MLD into flex, abd and D2 to pts tolerance and scapular depression by therapist throughout  MFR to multiple cords in Rt axilla and as pt could tolerate, she is very tender to touch in medial upper arm over cording Therapeutic Exercises Pulleys into flex x 2 mins, VC's to remind pt to decrease scap compensation Roll yellow ball up wall into flex x 7, pt with no shoulder discomfort with this today     PATIENT EDUCATION:  Education details: POC, LOS, treatment interventions, 4 post op exercises, NTS for cords Person educated: Patient Education method: Explanation, Demonstration, Verbal cues, and Handouts Education comprehension: verbalized understanding, returned demonstration, and verbal cues required  HOME EXERCISE PROGRAM: Instructed 4 post op exercises with pt holding Right wrist with left hand for AA flexion in supine, supine stargazer, scapular retraction, wall slides. Showed wrist extension with elbow ext and gentle shoulder ext to stretch cords. Advised to do with appropriate tightness only and not to force. Should not have pain in shoulder. To perform 2x's per day. Also educated in scar massage to incision area, and advised to watch ABC video.   ASSESSMENT:  CLINICAL IMPRESSION: Pt really liked the gray foam pad to place in her bra, and felt much better wearing the compression bra as well. Pt with excellent improvement in ROM noted today and she has been compliant with exercises at home. Cording still present but less visible and less restricting.    Pt will benefit from skilled therapeutic intervention to  improve on the following deficits: Decreased knowledge of precautions, impaired UE functional use, pain, decreased ROM, postural dysfunction.   PT treatment/interventions: ADL/Self care home management, [phone]- PT Re-evaluation, [phone]- Physical Performance Testing, 97110-Therapeutic exercises, 97530- Therapeutic activity, [id number]- Neuromuscular re-education, 97535- Self Care, [id number]- Manual therapy, 97760- Orthotic Initial, [phone]- Orthotic/Prosthetic subsequent, and Patient/Family education   GOALS: Goals reviewed with patient? Yes  LONG TERM GOALS:  (STG=LTG)  GOALS Name Target Date  Goal status  1 Pt will demonstrate she has regained full shoulder ROM and function post operatively compared to baselines.  Baseline: 02/24/2024 INITIAL  2 Pt will be independent in Right shoulder ROM and strengthening 02/24/2024 06/15/2024 INITIAL  3 Pt will be independent in right breast MLD to decrease breast swelling 02/24/2024 06/15/2024 INITIAL  4 Pt will be compliant with compression bra to decrease right breast swelling 02/24/2024 06/15/2024 INITIAL  5. Quick dash will be no greater than 15% to demonstrate improved UE function  06/15/2024 NEW     PLAN:  PT FREQUENCY/DURATION: 2x/week x 6 weeks  PLAN FOR NEXT SESSION: check  skin, pulleys, wand, try foam pads? HEP and progress AA/A/ROM stretches, supine over half foam roll?,  Cont and review MLD and cont MFR to cords,    [facility]  [address]  [place] [address]  [phone]   Grayce JINNY Sheldon, PT 05/06/2024, 8:54 AM

## 2024-05-11 ENCOUNTER — Ambulatory Visit

## 2024-05-11 DIAGNOSIS — N63 Unspecified lump in unspecified breast: Secondary | ICD-10-CM

## 2024-05-11 DIAGNOSIS — R293 Abnormal posture: Secondary | ICD-10-CM

## 2024-05-11 DIAGNOSIS — C50411 Malignant neoplasm of upper-outer quadrant of right female breast: Secondary | ICD-10-CM | POA: Diagnosis not present

## 2024-05-11 DIAGNOSIS — I890 Lymphedema, not elsewhere classified: Secondary | ICD-10-CM

## 2024-05-11 DIAGNOSIS — G8929 Other chronic pain: Secondary | ICD-10-CM

## 2024-05-11 DIAGNOSIS — Z170 Estrogen receptor positive status [ER+]: Secondary | ICD-10-CM

## 2024-05-11 NOTE — Therapy (Signed)
 OUTPATIENT PHYSICAL THERAPY BREAST CANCER TREATMENT   Patient Name: [name] MRN: [id number] DOB:10-01-1955, 68 y.o., female Today's Date: 05/11/2024  END OF SESSION:  PT End of Session - 05/11/24 0814     Visit Number 11    Number of Visits 21    Date for Recertification  06/15/24    PT Start Time 0807    PT Stop Time 0901    PT Time Calculation (min) 54 min    Activity Tolerance Patient tolerated treatment well    Behavior During Therapy [facility] for tasks assessed/performed           Past Medical History:  Diagnosis Date   Arthritis    neck, knees, ankles, feet, back   Asthma, mild intermittent    prn inhaler   Breast cancer (HCC)    Cervical spondylosis    Chronic dyspnea    per pt gets winded w/ stairs, but recovers quickly   Diverticular disease of colon    followed by dr kristie (GI)---  chronic w/ chronic diarrhea   Essential hypertension    Fatty infiltration of liver    followed by dr kristie--- abd ultrasound in epic 08-31-2020   Frequency of urination    History of chest pain    evaluated by cardiologist-- dr pietro,  11/ 2017 nuclear stress test suggest ischemia,  06/2016 cardiac cath , showed normal coronary anatomy and lvf   History of COVID-19    per pt in 2019   treated with presumed covid had  mild to moderate symptoms that resolved   History of diverticulitis of colon    05/ 2022   History of radiation therapy    Right breast-12/30/23-01/27/24-Dr. Lynwood Nasuti   History of TIA (transient ischemic attack) 06/2003   Hyperlipidemia    Hypertension    Limitation of joint motion of neck    per pt due to bone spurs C2-5   Mild intermittent asthma    followed by pcp   PMB (postmenopausal bleeding)    Pre-diabetes    Pulmonary nodule    followed by pcp,  last hest CT in epic 04-21-2018   Stenosis of cervix    SUI (stress urinary incontinence, female)    Thickened endometrium    TMJ syndrome    Wears glasses    Past Surgical History:   Procedure Laterality Date   APPENDECTOMY  1979   BREAST ENHANCEMENT SURGERY Bilateral 1988   per pt implants removed 2002   BUNIONECTOMY Left 07/17/2007   @[facility]    BUNIONECTOMY Right 05/26/2014   Procedure: RIGHT FOOT LAPIDUS BUNION CORRECTION AMD MODIFIED MCBRIDE BUNIONECTOMY;  Surgeon: Norleen Armor, MD;  Location: [facility];  Service: Orthopedics;  Laterality: Right;   CARDIAC CATHETERIZATION N/A 06/28/2016   Procedure: Left Heart Cath and Coronary Angiography;  Surgeon: Peter M Swaziland, MD;  Location: [facility] INVASIVE CV LAB;  Service: Cardiovascular;  Laterality: N/A;   CESAREAN SECTION  1986   COLONOSCOPY  12/27/2020   by dr kristie   FINGER SURGERY Left 07/28/2015   @[facility]  :  left thumb knuckle collasped  arthroplasty   FOOT HARDWARE REMOVAL  04/25/2016   @[facility] ;   right foot   FOOT SURGERY Right 04/04/2015   @[facility] ;   first and second toe's   GANGLION CYST EXCISION Right 2016   wrist   HYSTEROSCOPY WITH D & C N/A 05/24/2021   Procedure: DILATATION AND CURETTAGE /HYSTEROSCOPY and MyoSure;  Surgeon: Rendell Calton LABOR, DO;  Location: [facility];  Service: Gynecology;  Laterality: N/A;   INCISION AND DRAINAGE, ABSCESS, BREAST Right 03/05/2024   Procedure: INCISION AND DRAINAGE, ABSCESS, BREAST;  Surgeon: Belinda Cough, MD;  Location: [facility];  Service: General;  Laterality: Right;   IRRIGATION AND DEBRIDEMENT HEMATOMA Right 03/05/2024   Procedure: IRRIGATION AND DEBRIDEMENT HEMATOMA;  Surgeon: Belinda Cough, MD;  Location: [facility];  Service: General;  Laterality: Right;  WOUND EXPLORATION RIGHT CHEST WALL   NASAL SEPTUM SURGERY  1981   RADIOACTIVE SEED GUIDED AXILLARY SENTINEL LYMPH NODE Right 11/27/2023   Procedure: RIGHT BREAST LUMPECTOMY AFTER MAGNETIC SEED LOCALIZATION AND SENTINEL LYMPH NODE BIOPSY;  Surgeon: Belinda Cough, MD;  Location: [facility];  Service: General;  Laterality: Right;  GEN w/PEC  BLOCK RIGHT BREAST SEED LOCALIZED LUMPECTOMY WITH AXILLARY SENTINEL LYMPH NODE BIOPSY   ROTATOR CUFF REPAIR W/ DISTAL CLAVICLE EXCISION Left 08/18/2008   @[facility]  by dr amy   SHOULDER ARTHROSCOPY W/ LABRAL REPAIR Left 06/07/2008   @[facility] ;  and SAD  by dr amy   TONSILLECTOMY  1964   TUBAL LIGATION Bilateral 1999   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   Spondylolisthesis of lumbar region 04/29/2017   Spondylosis without myelopathy or radiculopathy, cervical region 04/29/2017   Pain from implanted hardware 04/04/2016   Obesity 03/19/2016   Primary osteoarthritis of first carpometacarpal joint of left hand 07/13/2015   History of TIA (transient ischemic attack) 03/14/2015   Metatarsalgia of right foot 03/08/2015   Routine general medical examination at a health care facility 11/23/2012   Essential hypertension 06/16/2008   Hyperlipidemia 12/29/2007    PCP:   REFERRING PROVIDER: Lauraine Golden, MD  REFERRING DIAG: s/p Right Breast Cancer  THERAPY DIAG:  Malignant neoplasm of upper-outer quadrant of right breast in female, estrogen receptor positive (HCC)  Abnormal posture  Chronic right shoulder pain  Swelling of breast  Lymphedema, not elsewhere classified  Rationale for Evaluation and Treatment: Rehabilitation  ONSET DATE: 01/06/2024 SUBJECTIVE:  SUBJECTIVE STATEMENT: The gray foam is really helping decrease the soreness at my breast. And the cupping felt so good. It was like that good hurt, you just know your body needed that.   05/04/2024 Re-EVAL I had the I and D and it was some better, but now I am being bothered by pain at the 5:00 position of my breast, not along the crease like the MD says. The MD said I don't need to wear my compression bra anymore. Pain in the axilla and breast is a 7/10. I can't raise my arm because the cording pulls down into the breast.  I am not having the swelling in the upper breast anymore, but now at 5:00 position I have swelling and firmness. It is tender and sore. I will get an US  on Oct 9.  02/16/2024 I started having severe left breast swelling and pain on Saturday. My breast looks like a [place] and it had been doing so much better. I had been lifting my 62 year old grandson. It hurts down my right UE into my wrist. I slept 29 hours yesterday, and I had a low grade fever yesterday and chills. I have been nauseated as well, and threw up one time. I called the nurse line and they suggested I go to the ED, but I didn't want to do that.   PERTINENT HISTORY:  Patient was diagnosed on 10/24/2023 with right grade 2 invasive ductal carcinoma breast cancer. It measures 9 mm and is located in the upper outer quadrant. It is ER/PR positive and HER2 negative with a Ki67 of 20%. She had a Right lumpectomy with SLNB on 11/27/2023 with 0+/2 LN's. She developed a seroma in the axilla and lateral breast and had 160 c of fluid drained on 12/08/2023.  5 days later they drained and got 120 cc,She had another 10 cc drained on 12/22/2023. She is presently undergoing radiation.She has a history of endometrial cancer in 2022 and a history of a TIA  05/04/2024; pt is now post radiation, last seen in therapy on 02/16/24 and  advised to see MD for rapid increase in swelling and fever. On 02/18/2024 had 60 cc aspirated from seroma. The seroma re-accumulated and was aspirated of 150 cc with US  guidance. In a week it had re-accumulated again and pt underwent I and D on 03/05/2024 and had a drain placed. Drain was removed on 04/06/2024. When MD saw pt pain had improved and wound was healed but she was feeling some firmness in the inferior breast that was going to be evaluated by US . She continues with complaints of cording in the axilla/upper arm.  PATIENT GOALS:  Reassess how my recovery is going related to arm function, pain, and swelling.  PAIN:  Are you having pain? PAIN:  Are you having pain? Yes NPRS scale: 3/10 right breast/arm cording Pain location: Right breast, axilla, upper arm, lower foreaem Pain orientation: Right  PAIN TYPE: tight Pain description: intermittent in arm, breast is constant Aggravating factors: raising arm, carrying brief case, reaching for dishes Relieving factors: nothing    PRECAUTIONS: Recent Surgery, right UE Lymphedema risk, Prior TIA  RED FLAGS: None   ACTIVITY LEVEL / LEISURE: walking   OBJECTIVE:   PATIENT SURVEYS:  QUICK DASH: EVAL 31.82,  05/04/2024; 31.82 BREAST COMPLAINTS QUESTIONNAIRE (9/30) Pain:7 Heaviness: 7 Swollen feeling: 7 Tense Skin: 7 Redness: 7 Bra Print: 0 Size of Pores: 3 Hard feeling: 7 Total:     45/80 A Score over 9 indicates lymphedema  issues in the breast   OBSERVATIONS: EVAL: Large area of swelling with induration right upper breast.(05/04/2024)  Large indention noted at lateral breast (see photo in media) from where drain was in after I and D. Fibrosis noted inferior medial right breast with tenderness at lower breast above fibrosis  POSTURE:  Forward head and rounded shoulders posture    LYMPHEDEMA ASSESSMENT:   UPPER EXTREMITY AROM/PROM:   A/PROM RIGHT   eval   RIGHT 01/13/2024 RIGHT 05/04/2024  Shoulder extension 57 45 42   Shoulder flexion 160 140 125 with pulling  Shoulder abduction 169 115 tight under arm 90 with pulling  Shoulder internal rotation 67 45   Shoulder external rotation 82 60                           (Blank rows = not tested)   A/PROM LEFT   eval  Shoulder extension 48  Shoulder flexion 137  Shoulder abduction 168  Shoulder internal rotation 57  Shoulder external rotation 75                          (Blank rows = not tested)   CERVICAL AROM: All within normal limits   UPPER EXTREMITY STRENGTH: WNL   LYMPHEDEMA ASSESSMENTS (in cm):    LANDMARK RIGHT   eval RIGHT 01/13/2024  10 cm proximal to olecranon process 32.9 33.8  Olecranon process 25 24.7  10 cm proximal to ulnar styloid process 24.5 23.0  Just proximal to ulnar styloid process 16.9 17.4  Across hand at thumb web space 19.5 19.6  At base of 2nd digit 6.6 6.3  (Blank rows = not tested)   LANDMARK LEFT   eval LEFT 01/13/2024  10 cm proximal to olecranon process 35 34.6  Olecranon process 25.3 25.5  10 cm proximal to ulnar styloid process 23 22.7  Just proximal to ulnar styloid process 16.4 16.5  Across hand at thumb web space 18.1 18.7  At base of 2nd digit 6.5 6.4  (Blank rows = not tested)    Surgery type/Date: right breast lumpectomy and sentinel lymph node biopsy on 11/27/2023.  Number of lymph nodes removed: 0+/2 Current/past treatment (chemo, radiation, hormone therapy): radiation Other symptoms:  Heaviness/tightness No Pain Yes Pitting edema No Infections No, 05/04/2024 Yes; left Breast treated for initially, then antibiotics held when aspirations occurred Decreased scar mobility Yes Stemmer sign No   TODAY'S TREATMENT:  05/11/24: Therapeutic Exercises Pulleys into flex x 2 mins, increased Lt shoulder pain with pulling Rt UE so stopped Roll yellow ball up wall into flex and Rt abd x 10 each Therapeutic Activities Supine over half foam roll: Rt UE horz abd but no stretch felt so stopped, then Rt UE  scaption x 10, and Rt UE abd in a snow angel x 10, 5 sec holds (did not do Lt UE due to increased pain from chronic issues), pt reports great stretches with these Manual therapy MFR for Cording release to right axilla/breast in supine STM to right UT, pectorals and lateral trunk with cocoa butter Dynamic cupping to right upper arm area of cording with 2nd largest clear cup using cocoa butter for glide PROM Right shoulder flexion, abduction, and D2 with scapular depression throughout all manual thearpy   05/06/2024  Pulleys flex and abd x 2 min ea, VC to depress scapula Yellow ball rolls x 5 flexion and abd Standing lat stretch at laundry cart  3 x 20 sec Cording release to right axilla/breast in supine STM to right UT, pectorals and lateral trunk with cocoa butter Dynamic cupping to right upper arm area of cording with 2nd largest clear cup using cocoa butter for glide PROM Right shoulder flexion, abduction, IR and ER   05/04/2024 Measured ROM and assessed right breast for Re-eval Cording release to right axilla/breast in supine. Pt performed AA flexion x 5 and Stargazer stretch x 5, wall slide x 5 to resume exercises and stretch cords. PROM performed by PT;flexion, abd, IR and ER per pt tolerance Foam pack, and chip pack made for pt to try at inferior right breast to place in compression bra when she gets home.   02/16/2024 Right breast with significant increase in swelling, mild redness. Pulling down arm and tightness with arm pain consistent with cording. Pt advised not able to be seen today until evaluated due to concerns of infection given rapid swelling increase, fever etc. Pt is going to call cancer center and see MD today. Advised pt to contact me and let me know how she is.       PATIENT EDUCATION:  Education details: POC, LOS, treatment interventions, 4 post op exercises, NTS for cords Person educated: Patient Education method: Explanation, Demonstration, Verbal cues,  and Handouts Education comprehension: verbalized understanding, returned demonstration, and verbal cues required  HOME EXERCISE PROGRAM: Instructed 4 post op exercises with pt holding Right wrist with left hand for AA flexion in supine, supine stargazer, scapular retraction, wall slides. Showed wrist extension with elbow ext and gentle shoulder ext to stretch cords. Advised to do with appropriate tightness only and not to force. Should not have pain in shoulder. To perform 2x's per day. Also educated in scar massage to incision area, and advised to watch ABC video.   ASSESSMENT:  CLINICAL IMPRESSION: Pt cots to report good benefit fro gray foam in bra. Continued with AA/ROM and added A/ROM stretches over half foam roll which pt reported feeling great benefit of stretches. She was only able to use her Rt arm though due to chronic pain in Lt shoulder. Then continued with manual therapy working to decrease fascial restrictions due to recent surgery.     Pt will benefit from skilled therapeutic intervention to improve on the following deficits: Decreased knowledge of precautions, impaired UE functional use, pain, decreased ROM, postural dysfunction.   PT treatment/interventions: ADL/Self care home management, [phone]- PT Re-evaluation, [phone]- Physical Performance Testing, 97110-Therapeutic exercises, 97530- Therapeutic activity, [id number]- Neuromuscular re-education, 97535- Self Care, [id number]- Manual therapy, 97760- Orthotic Initial, [phone]- Orthotic/Prosthetic subsequent, and Patient/Family education   GOALS: Goals reviewed with patient? Yes  LONG TERM GOALS:  (STG=LTG)  GOALS Name Target Date  Goal status  1 Pt will demonstrate she has regained full shoulder ROM and function post operatively compared to baselines.  Baseline: 02/24/2024 INITIAL  2 Pt will be independent in Right shoulder ROM and strengthening 02/24/2024 06/15/2024 INITIAL  3 Pt will be independent in right breast MLD to decrease  breast swelling 02/24/2024 06/15/2024 INITIAL  4 Pt will be compliant with compression bra to decrease right breast swelling 02/24/2024 06/15/2024 INITIAL  5. Quick dash will be no greater than 15% to demonstrate improved UE function  06/15/2024 NEW     PLAN:  PT FREQUENCY/DURATION: 2x/week x 6 weeks  PLAN FOR NEXT SESSION: Show pt swell spot to see if interested since having such good relief with gray foam; cont pulleys, wand, HEP and progress AA/A/ROM stretches, supine over  half foam roll?,  Cont and review MLD and cont MFR to cords   [facility]  [address]  [place] [address]  [phone]   Aden Berwyn Caldron, PTA 05/11/2024, 9:21 AM

## 2024-05-13 ENCOUNTER — Ambulatory Visit: Admitting: Rehabilitation

## 2024-05-18 ENCOUNTER — Ambulatory Visit

## 2024-05-18 DIAGNOSIS — R293 Abnormal posture: Secondary | ICD-10-CM

## 2024-05-18 DIAGNOSIS — I890 Lymphedema, not elsewhere classified: Secondary | ICD-10-CM

## 2024-05-18 DIAGNOSIS — G8929 Other chronic pain: Secondary | ICD-10-CM

## 2024-05-18 DIAGNOSIS — C50411 Malignant neoplasm of upper-outer quadrant of right female breast: Secondary | ICD-10-CM | POA: Diagnosis not present

## 2024-05-18 DIAGNOSIS — N63 Unspecified lump in unspecified breast: Secondary | ICD-10-CM

## 2024-05-18 NOTE — Therapy (Signed)
 OUTPATIENT PHYSICAL THERAPY BREAST CANCER TREATMENT   Patient Name: [name] MRN: [id number] DOB:Sep 27, 1955, 68 y.o., female Today's Date: 05/18/2024  END OF SESSION:  PT End of Session - 05/18/24 0803     Visit Number 12    Number of Visits 21    Date for Recertification  06/15/24    PT Start Time 0800    PT Stop Time 0850   pt had to leave early for meeting   PT Time Calculation (min) 50 min    Activity Tolerance Patient tolerated treatment well    Behavior During Therapy [facility] for tasks assessed/performed           Past Medical History:  Diagnosis Date   Arthritis    neck, knees, ankles, feet, back   Asthma, mild intermittent    prn inhaler   Breast cancer (HCC)    Cervical spondylosis    Chronic dyspnea    per pt gets winded w/ stairs, but recovers quickly   Diverticular disease of colon    followed by dr kristie (GI)---  chronic w/ chronic diarrhea   Essential hypertension    Fatty infiltration of liver    followed by dr kristie--- abd ultrasound in epic 08-31-2020   Frequency of urination    History of chest pain    evaluated by cardiologist-- dr pietro,  11/ 2017 nuclear stress test suggest ischemia,  06/2016 cardiac cath , showed normal coronary anatomy and lvf   History of COVID-19    per pt in 2019   treated with presumed covid had  mild to moderate symptoms that resolved   History of diverticulitis of colon    05/ 2022   History of radiation therapy    Right breast-12/30/23-01/27/24-Dr. Lynwood Nasuti   History of TIA (transient ischemic attack) 06/2003   Hyperlipidemia    Hypertension    Limitation of joint motion of neck    per pt due to bone spurs C2-5   Mild intermittent asthma    followed by pcp   PMB (postmenopausal bleeding)    Pre-diabetes    Pulmonary nodule    followed by pcp,  last hest CT in epic 04-21-2018   Stenosis of cervix    SUI (stress urinary incontinence, female)    Thickened endometrium    TMJ syndrome    Wears glasses     Past Surgical History:  Procedure Laterality Date   APPENDECTOMY  1979   BREAST ENHANCEMENT SURGERY Bilateral 1988   per pt implants removed 2002   BUNIONECTOMY Left 07/17/2007   @[facility]    BUNIONECTOMY Right 05/26/2014   Procedure: RIGHT FOOT LAPIDUS BUNION CORRECTION AMD MODIFIED MCBRIDE BUNIONECTOMY;  Surgeon: Norleen Armor, MD;  Location: [facility];  Service: Orthopedics;  Laterality: Right;   CARDIAC CATHETERIZATION N/A 06/28/2016   Procedure: Left Heart Cath and Coronary Angiography;  Surgeon: Peter M Swaziland, MD;  Location: [facility] INVAS[facility] CV LAB;  Service: Cardiovascular;  Laterality: N/A;   CESAREAN SECTION  1986   COLONOSCOPY  12/27/2020   by dr kristie   FINGER SURGERY Left 07/28/2015   @[facility]  :  left thumb knuckle collasped  arthroplasty   FOOT HARDWARE REMOVAL  04/25/2016   @[facility] ;   right foot   FOOT SURGERY Right 04/04/2015   @[facility] ;   first and second toe's   GANGLION CYST EXCISION Right 2016   wrist   HYSTEROSCOPY WITH D & C N/A 05/24/2021   Procedure: DILATATION AND CURETTAGE /HYSTEROSCOPY and MyoSure;  Surgeon: Rendell,  Cassandra A, DO;  Location: [facility];  Service: Gynecology;  Laterality: N/A;   INCISION AND DRAINAGE, ABSCESS, BREAST Right 03/05/2024   Procedure: INCISION AND DRAINAGE, ABSCESS, BREAST;  Surgeon: Belinda Cough, MD;  Location: [facility];  Service: General;  Laterality: Right;   IRRIGATION AND DEBRIDEMENT HEMATOMA Right 03/05/2024   Procedure: IRRIGATION AND DEBRIDEMENT HEMATOMA;  Surgeon: Belinda Cough, MD;  Location: [facility];  Service: General;  Laterality: Right;  WOUND EXPLORATION RIGHT CHEST WALL   NASAL SEPTUM SURGERY  1981   RADIOACTIVE SEED GUIDED AXILLARY SENTINEL LYMPH NODE Right 11/27/2023   Procedure: RIGHT BREAST LUMPECTOMY AFTER MAGNETIC SEED LOCALIZATION AND SENTINEL LYMPH NODE BIOPSY;  Surgeon: Belinda Cough, MD;  Location:  SURGERY CENTER;  Service: General;  Laterality:  Right;  GEN w/PEC BLOCK RIGHT BREAST SEED LOCALIZED LUMPECTOMY WITH AXILLARY SENTINEL LYMPH NODE BIOPSY   ROTATOR CUFF REPAIR W/ DISTAL CLAVICLE EXCISION Left 08/18/2008   @[facility]  by dr amy   SHOULDER ARTHROSCOPY W/ LABRAL REPAIR Left 06/07/2008   @[facility] ;  and SAD  by dr amy   TONSILLECTOMY  1964   TUBAL LIGATION Bilateral 1999   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   Spondylolisthesis of lumbar region 04/29/2017   Spondylosis without myelopathy or radiculopathy, cervical region 04/29/2017   Pain from implanted hardware 04/04/2016   Obesity 03/19/2016   Primary osteoarthritis of first carpometacarpal joint of left hand 07/13/2015   History of TIA (transient ischemic attack) 03/14/2015   Metatarsalgia of right foot 03/08/2015   Routine general medical examination at a health care facility 11/23/2012   Essential hypertension 06/16/2008   Hyperlipidemia 12/29/2007    PCP:   REFERRING PROVIDER: Lauraine Golden, MD  REFERRING DIAG: s/p Right Breast Cancer  THERAPY DIAG:  Malignant neoplasm of upper-outer quadrant of right breast in female, estrogen receptor positive (HCC)  Abnormal posture  Chronic right shoulder pain  Swelling of breast  Lymphedema, not elsewhere classified  Rationale for Evaluation and Treatment: Rehabilitation  ONSET DATE: 01/06/2024 SUBJECTIVE:  SUBJECTIVE STATEMENT: My Rt shoulder is doing great! I've been stretching before bed more and I can tell that's been helping my pain.   05/04/2024 Re-EVAL I had the I and D and it was some better, but now I am being bothered by pain at the 5:00 position of my breast, not along the crease like the MD says. The MD said I don't need to wear my compression bra anymore. Pain in the axilla and breast is a 7/10. I can't raise my arm because the cording pulls down into the breast.  I am not having the swelling in the upper breast anymore, but now at 5:00 position I have swelling and firmness. It is tender and sore. I will get an US  on Oct 9.  02/16/2024 I started having severe left breast swelling and pain on Saturday. My breast looks like a [place]alope and it had been doing so much better. I had been lifting my 66 year old grandson. It hurts down my right UE into my wrist. I slept 29 hours yesterday, and I had a low grade fever yesterday and chills. I have been nauseated as well, and threw up one time. I called the nurse line and they suggested I go to the ED, but I didn't want to do that.   PERTINENT HISTORY:  Patient was diagnosed on 10/24/2023 with right grade 2 invasive ductal carcinoma breast cancer. It measures 9 mm and is located in the upper outer quadrant. It is ER/PR positive and HER2 negative with a Ki67 of 20%. She had a Right lumpectomy with SLNB on 11/27/2023 with 0+/2 LN's. She developed a seroma in the axilla and lateral breast and had 160 c of fluid drained on 12/08/2023.  5 days later they drained and got 120 cc,She had another 10 cc drained on 12/22/2023. She is presently undergoing radiation.She has a history of endometrial cancer in 2022 and a history of a TIA  05/04/2024; pt is now post radiation, last seen in therapy on 02/16/24 and advised to see MD for rapid increase in  swelling and fever. On 02/18/2024 had 60 cc aspirated from seroma. The seroma re-accumulated and was aspirated of 150 cc with US  guidance. In a week it had re-accumulated again and pt underwent I and D on 03/05/2024 and had a drain placed. Drain was removed on 04/06/2024. When MD saw pt pain had improved and wound was healed but she was feeling some firmness in the inferior breast that was going to be evaluated by US . She continues with complaints of cording in the axilla/upper arm.  PATIENT GOALS:  Reassess how my recovery is going related to arm function, pain, and swelling.  PAIN:  Are you having pain? PAIN:  Are you having pain? Yes NPRS scale: 3/10 right breast/arm cording Pain location: Right breast, axilla, upper arm, lower foreaem Pain orientation: Right  PAIN TYPE: tight Pain description: intermittent in arm, breast is constant Aggravating factors: raising arm, carrying brief case, reaching for dishes Relieving factors: nothing    PRECAUTIONS: Recent Surgery, right UE Lymphedema risk, Prior TIA  RED FLAGS: None   ACTIVITY LEVEL / LEISURE: walking   OBJECTIVE:   PATIENT SURVEYS:  QUICK DASH: EVAL 31.82,  05/04/2024; 31.82 BREAST COMPLAINTS QUESTIONNAIRE (9/30) Pain:7 Heaviness: 7 Swollen feeling: 7 Tense Skin: 7 Redness: 7 Bra Print: 0 Size of Pores: 3 Hard feeling: 7 Total:     45/80 A Score over 9 indicates lymphedema issues in the breast   OBSERVATIONS: EVAL: Large area  of swelling with induration right upper breast.(05/04/2024)  Large indention noted at lateral breast (see photo in media) from where drain was in after I and D. Fibrosis noted inferior medial right breast with tenderness at lower breast above fibrosis  POSTURE:  Forward head and rounded shoulders posture    LYMPHEDEMA ASSESSMENT:   UPPER EXTREMITY AROM/PROM:   A/PROM RIGHT   eval   RIGHT 01/13/2024 RIGHT 05/04/2024  Shoulder extension 57 45 42  Shoulder flexion 160 140 125 with pulling   Shoulder abduction 169 115 tight under arm 90 with pulling  Shoulder internal rotation 67 45   Shoulder external rotation 82 60                           (Blank rows = not tested)   A/PROM LEFT   eval  Shoulder extension 48  Shoulder flexion 137  Shoulder abduction 168  Shoulder internal rotation 57  Shoulder external rotation 75                          (Blank rows = not tested)   CERVICAL AROM: All within normal limits   UPPER EXTREMITY STRENGTH: WNL   LYMPHEDEMA ASSESSMENTS (in cm):    LANDMARK RIGHT   eval RIGHT 01/13/2024  10 cm proximal to olecranon process 32.9 33.8  Olecranon process 25 24.7  10 cm proximal to ulnar styloid process 24.5 23.0  Just proximal to ulnar styloid process 16.9 17.4  Across hand at thumb web space 19.5 19.6  At base of 2nd digit 6.6 6.3  (Blank rows = not tested)   LANDMARK LEFT   eval LEFT 01/13/2024  10 cm proximal to olecranon process 35 34.6  Olecranon process 25.3 25.5  10 cm proximal to ulnar styloid process 23 22.7  Just proximal to ulnar styloid process 16.4 16.5  Across hand at thumb web space 18.1 18.7  At base of 2nd digit 6.5 6.4  (Blank rows = not tested)    Surgery type/Date: right breast lumpectomy and sentinel lymph node biopsy on 11/27/2023.  Number of lymph nodes removed: 0+/2 Current/past treatment (chemo, radiation, hormone therapy): radiation Other symptoms:  Heaviness/tightness No Pain Yes Pitting edema No Infections No, 05/04/2024 Yes; left Breast treated for initially, then antibiotics held when aspirations occurred Decreased scar mobility Yes Stemmer sign No   TODAY'S TREATMENT:  05/18/24: Therapeutic Exercises Pulleys into flex and abd but no stretch felt so stopped Roll yellow ball up wall into flex and Rt abd x 10 each Therapeutic Activities Free Motion Machine: Scapular Retraction 7#, 2 x 10 returning therapist demo Supine over half foam roll: Rt UE scaption in a V x 10, and Rt UE abd in a  snow angel x 10, 5 sec holds Manual therapy MFR for Cording release to right axilla/breast in supine STM to right UT, pectorals and lateral trunk with cocoa butter Dynamic cupping to right upper arm area of cording with 2nd smallest cup using cocoa butter for glide PROM Right shoulder flexion, abduction, and D2 with scapular depression throughout all manual thearpy  05/11/24: Therapeutic Exercises Pulleys into flex x 2 mins, increased Lt shoulder pain with pulling Rt UE so stopped Roll yellow ball up wall into flex and Rt abd x 10 each Therapeutic Activities Supine over half foam roll: Rt UE horz abd but no stretch felt so stopped, then Rt UE scaption x 10, and Rt UE abd  in a snow angel x 10, 5 sec holds (did not do Lt UE due to increased pain from chronic issues), pt reports great stretches with these Manual therapy MFR for Cording release to right axilla/breast in supine STM to right UT, pectorals and lateral trunk with cocoa butter Dynamic cupping to right upper arm area of cording with 2nd largest clear cup using cocoa butter for glide PROM Right shoulder flexion, abduction, and D2 with scapular depression throughout all manual thearpy   05/06/2024  Pulleys flex and abd x 2 min ea, VC to depress scapula Yellow ball rolls x 5 flexion and abd Standing lat stretch at laundry cart 3 x 20 sec Cording release to right axilla/breast in supine STM to right UT, pectorals and lateral trunk with cocoa butter Dynamic cupping to right upper arm area of cording with 2nd largest clear cup using cocoa butter for glide PROM Right shoulder flexion, abduction, IR and ER        PATIENT EDUCATION:  Education details: POC, LOS, treatment interventions, 4 post op exercises, NTS for cords Person educated: Patient Education method: Programmer, multimedia, Demonstration, Verbal cues, and Handouts Education comprehension: verbalized understanding, returned demonstration, and verbal cues required  HOME  EXERCISE PROGRAM: Instructed 4 post op exercises with pt holding Right wrist with left hand for AA flexion in supine, supine stargazer, scapular retraction, wall slides. Showed wrist extension with elbow ext and gentle shoulder ext to stretch cords. Advised to do with appropriate tightness only and not to force. Should not have pain in shoulder. To perform 2x's per day. Also educated in scar massage to incision area, and advised to watch ABC video.   ASSESSMENT:  CLINICAL IMPRESSION: Continued with AA/A/ROM stretching as pt conts to report feeling good stretches and noting improved ROM with less pain. Then continued with manual therapy working to further decrease fascial restrictions and further improve her end shoulder ROM. Showed pt [employer] swell spot online since she reports such good relief with gray foam. She will pursue getting this and doesn't need handout as she reports can remember [email or web address] and to look up breast swell spot. At end of session her Rt breast looked a little darker/red in appearance than last session and when pt looked in mirror she also reports it appearing slightly darker than usual. Not hot to touch and no current pain reported but advised pt to keep an eye on her breast today assessing it during day and call doctor if it appears to be aware of S/S of infection which pt is familiar with. Pt verbalized understanding.     Pt will benefit from skilled therapeutic intervention to improve on the following deficits: Decreased knowledge of precautions, impaired UE functional use, pain, decreased ROM, postural dysfunction.   PT treatment/interventions: ADL/Self care home management, [phone]- PT Re-evaluation, [phone]- Physical Performance Testing, 97110-Therapeutic exercises, 97530- Therapeutic activity, [id number]- Neuromuscular re-education, 97535- Self Care, [id number]- Manual therapy, 97760- Orthotic Initial, [phone]- Orthotic/Prosthetic subsequent, and Patient/Family  education   GOALS: Goals reviewed with patient? Yes  LONG TERM GOALS:  (STG=LTG)  GOALS Name Target Date  Goal status  1 Pt will demonstrate she has regained full shoulder ROM and function post operatively compared to baselines.  Baseline: 02/24/2024 INITIAL  2 Pt will be independent in Right shoulder ROM and strengthening 02/24/2024 06/15/2024 INITIAL  3 Pt will be independent in right breast MLD to decrease breast swelling 02/24/2024 06/15/2024 INITIAL  4 Pt will be compliant with compression bra to decrease right breast swelling 02/24/2024 06/15/2024  INITIAL  5. Quick dash will be no greater than 15% to demonstrate improved UE function  06/15/2024 NEW     PLAN:  PT FREQUENCY/DURATION: 2x/week x 6 weeks  PLAN FOR NEXT SESSION: How is redness of Rt breast? cont ball roll up wall (may D/C pulleys as pt doesn't feel much stretch any longer and it flares up Lt to use), progress HEP prn and progress AA/A/ROM stretches  Cont and review MLD and cont MFR to cords   [facility]  [address], [address]  [place] [address]  [phone]   Aden Berwyn Caldron, PTA 05/18/2024, 8:54 AM

## 2024-05-20 ENCOUNTER — Ambulatory Visit

## 2024-05-25 ENCOUNTER — Ambulatory Visit

## 2024-05-26 ENCOUNTER — Inpatient Hospital Stay: Attending: Hematology and Oncology | Admitting: Adult Health

## 2024-05-26 ENCOUNTER — Encounter: Payer: Self-pay | Admitting: Adult Health

## 2024-05-26 ENCOUNTER — Other Ambulatory Visit: Payer: Self-pay | Admitting: Adult Health

## 2024-05-26 ENCOUNTER — Telehealth: Payer: Self-pay | Admitting: Adult Health

## 2024-05-26 VITALS — BP 131/84 | HR 76 | Temp 97.8°F | Resp 18 | Ht 63.5 in | Wt 194.2 lb

## 2024-05-26 DIAGNOSIS — Z807 Family history of other malignant neoplasms of lymphoid, hematopoietic and related tissues: Secondary | ICD-10-CM | POA: Insufficient documentation

## 2024-05-26 DIAGNOSIS — Z1721 Progesterone receptor positive status: Secondary | ICD-10-CM | POA: Insufficient documentation

## 2024-05-26 DIAGNOSIS — Z79811 Long term (current) use of aromatase inhibitors: Secondary | ICD-10-CM | POA: Insufficient documentation

## 2024-05-26 DIAGNOSIS — Z800 Family history of malignant neoplasm of digestive organs: Secondary | ICD-10-CM | POA: Diagnosis not present

## 2024-05-26 DIAGNOSIS — Z8049 Family history of malignant neoplasm of other genital organs: Secondary | ICD-10-CM | POA: Diagnosis not present

## 2024-05-26 DIAGNOSIS — Z87891 Personal history of nicotine dependence: Secondary | ICD-10-CM | POA: Insufficient documentation

## 2024-05-26 DIAGNOSIS — C50411 Malignant neoplasm of upper-outer quadrant of right female breast: Secondary | ICD-10-CM | POA: Diagnosis not present

## 2024-05-26 DIAGNOSIS — E669 Obesity, unspecified: Secondary | ICD-10-CM | POA: Insufficient documentation

## 2024-05-26 DIAGNOSIS — Z801 Family history of malignant neoplasm of trachea, bronchus and lung: Secondary | ICD-10-CM | POA: Diagnosis not present

## 2024-05-26 DIAGNOSIS — Z170 Estrogen receptor positive status [ER+]: Secondary | ICD-10-CM | POA: Insufficient documentation

## 2024-05-26 DIAGNOSIS — Z1732 Human epidermal growth factor receptor 2 negative status: Secondary | ICD-10-CM | POA: Insufficient documentation

## 2024-05-26 DIAGNOSIS — Z923 Personal history of irradiation: Secondary | ICD-10-CM | POA: Diagnosis not present

## 2024-05-26 MED ORDER — LETROZOLE 2.5 MG PO TABS: 2.5000 mg | ORAL_TABLET | Freq: Every day | ORAL | 0 refills | Status: AC

## 2024-05-26 NOTE — Progress Notes (Signed)
 [facility] Cancer Follow up:    Sara Almarie LABOR, MD [address] [place] [address]   DIAGNOSIS: Cancer Staging  Malignant neoplasm of upper-outer quadrant of right breast in female, estrogen receptor positive (HCC) Staging form: Breast, AJCC 8th Edition - Clinical stage from 11/19/2023: Stage IA (cT1b, cN0, cM0, G2, ER+, PR+, HER2-) - Signed by Loretha Ash, MD on 11/19/2023 Stage prefix: Initial diagnosis Histologic grading system: 3 grade system - Pathologic: Stage IA (pT1c, pN0, cM0, G2, ER+, PR+, HER2-) - Signed by Wyatt Leeroy HERO, PA-C on 12/22/2023 Histologic grading system: 3 grade system    SUMMARY OF ONCOLOGIC HISTORY: Oncology History  Malignant neoplasm of upper-outer quadrant of right breast in female, estrogen receptor positive (HCC)  10/24/2023 Mammogram   Irregular mass in the right breast which is indeterminate. Additional views with possible US  are recommended. US  confirmed 9 * 8 mm mass in the right breast.   11/03/2023 Pathology Results   Right breast needle core biopsy 8 cmfn showed IDC, grade 2  The tumor cells are NEGATIVE for Her2 (1+). Estrogen Receptor:  95%, POSITIVE, STRONG STAINING INTENSITY Progesterone Receptor:  100%, POSITIVE, STRONG STAINING INTENSITY Proliferation Marker Ki67:  20%    11/17/2023 Initial Diagnosis   Malignant neoplasm of upper-outer quadrant of right breast in female, estrogen receptor positive (HCC)   11/19/2023 Cancer Staging   Staging form: Breast, AJCC 8th Edition - Clinical stage from 11/19/2023: Stage IA (cT1b, cN0, cM0, G2, ER+, PR+, HER2-) - Signed by Loretha Ash, MD on 11/19/2023 Stage prefix: Initial diagnosis Histologic grading system: 3 grade system    Genetic Testing   [facility] CancerNext-Expanded Panel+RNA was Negative. Report date is 11/25/2023.   The CancerNext-Expanded gene panel offered by [facility] and includes sequencing, rearrangement, and RNA analysis for the following 76  genes: AIP, ALK, APC, ATM, AXIN2, BAP1, BARD1, BMPR1A, BRCA1, BRCA2, BRIP1, CDC73, CDH1, CDK4, CDKN1B, CDKN2A, CEBPA, CHEK2, CTNNA1, DDX41, DICER1, ETV6, FH, FLCN, GATA2, LZTR1, MAX, MBD4, MEN1, MET, MLH1, MSH2, MSH3, MSH6, MUTYH, NF1, NF2, NTHL1, PALB2, PHOX2B, PMS2, POT1, PRKAR1A, PTCH1, PTEN, RAD51C, RAD51D, RB1, RET, RUNX1, SDHA, SDHAF2, SDHB, SDHC, SDHD, SMAD4, SMARCA4, SMARCB1, SMARCE1, STK11, SUFU, TMEM127, TP53, TSC1, TSC2, VHL, and WT1 (sequencing and deletion/duplication); EGFR, HOXB13, KIT, MITF, PDGFRA, POLD1, and POLE (sequencing only); EPCAM and GREM1 (deletion/duplication only).     12/22/2023 Cancer Staging   Staging form: Breast, AJCC 8th Edition - Pathologic: Stage IA (pT1c, pN0, cM0, G2, ER+, PR+, HER2-) - Signed by Wyatt Leeroy HERO, PA-C on 12/22/2023 Histologic grading system: 3 grade system     CURRENT THERAPY:  INTERVAL HISTORY:  Discussed the use of AI scribe software for clinical note transcription with the patient, who gave verbal consent to proceed.  History of Present Illness [name] is a 68 year old female with stage 1A invasive ductal carcinoma who presents for follow-up and evaluation.  She has stage 1A invasive ductal carcinoma, ER positive, PR positive. She underwent a right breast lumpectomy on November 27, 2023, followed by adjuvant radiation therapy. Anastrozole  was started on March 05, 2024, but discontinued two weeks ago due to severe joint pain in her legs and hips, similar to cramps experienced with statins.  In July, she developed cellulitis in her breast, which led to significant swelling and required another surgery. A seroma developed, necessitating multiple drainages. She experienced a challenging recovery with symptoms of feeling cold and shaking, and was treated with antibiotics after a CT scan confirmed cellulitis.  A recent mammogram and ultrasound at [facility] were performed two weeks ago due to a lump, which was evaluated. She expresses  dissatisfaction with her previous surgeon's communication and care during her cellulitis and seroma treatment.  She has lost 78 pounds prior to her cancer diagnosis and is currently on Wegovy  for weight management, aiming to lose an additional 40 pounds. She engages in walking and chair yoga at home to be more active with her grandchildren.     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   Spondylolisthesis of lumbar region 04/29/2017   Spondylosis without myelopathy or radiculopathy, cervical region 04/29/2017   Pain from implanted hardware 04/04/2016   Obesity 03/19/2016   Primary osteoarthritis of first carpometacarpal joint of left hand 07/13/2015   History of TIA (transient ischemic attack) 03/14/2015   Metatarsalgia of right foot 03/08/2015   Routine general medical examination at a health care facility 11/23/2012   Essential hypertension 06/16/2008   Hyperlipidemia 12/29/2007    is allergic to doxycycline , hydrocodone  bit-homatrop mbr, penicillins, promethazine hcl, clarithromycin, statins, and kiwi extract.  MEDICAL HISTORY: Past Medical History:  Diagnosis Date   Arthritis    neck, knees, ankles, feet, back   Asthma, mild intermittent    prn inhaler   Breast cancer (HCC)    Cervical spondylosis    Chronic dyspnea    per pt gets winded w/ stairs, but recovers quickly   Diverticular disease of colon    followed by dr kristie  (GI)---  chronic w/ chronic diarrhea   Essential hypertension    Fatty infiltration of liver    followed by dr kristie--- abd ultrasound in epic 08-31-2020   Frequency of urination    History of chest pain    evaluated by cardiologist-- dr pietro,  11/ 2017 nuclear stress test suggest ischemia,  06/2016 cardiac cath , showed normal coronary anatomy and lvf   History of COVID-19    per pt in 2019   treated with presumed covid had  mild to moderate symptoms that resolved   History of diverticulitis of colon    05/ 2022   History of radiation therapy    Right breast-12/30/23-01/27/24-Dr. Lynwood Nasuti   History of TIA (transient ischemic attack) 06/2003   Hyperlipidemia    Hypertension    Limitation of joint motion of neck    per pt due to bone spurs C2-5   Mild intermittent asthma    followed by pcp   PMB (postmenopausal bleeding)    Pre-diabetes    Pulmonary nodule    followed by pcp,  last hest CT in epic 04-21-2018   Stenosis of cervix    SUI (stress urinary incontinence, female)    Thickened endometrium    TMJ syndrome    Wears glasses     SURGICAL HISTORY: Past Surgical History:  Procedure Laterality Date   APPENDECTOMY  1979   BREAST ENHANCEMENT SURGERY Bilateral 1988   per pt implants removed 2002   BUNIONECTOMY Left 07/17/2007   @[facility]   BUNIONECTOMY Right 05/26/2014   Procedure: RIGHT FOOT LAPIDUS BUNION CORRECTION AMD MODIFIED MCBRIDE BUNIONECTOMY;  Surgeon: Norleen Armor, MD;  Location: [facility];  Service: Orthopedics;  Laterality: Right;   CARDIAC CATHETERIZATION N/A 06/28/2016   Procedure: Left Heart Cath and Coronary Angiography;  Surgeon: Peter M Jordan, MD;  Location: [facility];  Service: Cardiovascular;  Laterality: N/A;   CESAREAN SECTION  1986   COLONOSCOPY  12/27/2020   by dr kristie   FINGER SURGERY Left 07/28/2015   @[facility]  :  left thumb knuckle collasped  arthroplasty   FOOT HARDWARE REMOVAL  04/25/2016   @[facility] ;   right foot    FOOT SURGERY Right 04/04/2015   @[facility] ;   first and second toe's   GANGLION CYST EXCISION Right 2016   wrist   HYSTEROSCOPY WITH D & C N/A 05/24/2021   Procedure: DILATATION AND CURETTAGE /HYSTEROSCOPY and MyoSure;  Surgeon: Rendell Calton LABOR, DO;  Location: [facility];  Service: Gynecology;  Laterality: N/A;   INCISION AND DRAINAGE, ABSCESS, BREAST Right 03/05/2024   Procedure: INCISION AND DRAINAGE, ABSCESS, BREAST;  Surgeon: Belinda Cough, MD;  Location: [facility];  Service: General;  Laterality: Right;   IRRIGATION AND DEBRIDEMENT HEMATOMA Right 03/05/2024   Procedure: IRRIGATION AND DEBRIDEMENT HEMATOMA;  Surgeon: Belinda Cough, MD;  Location: [facility];  Service: General;  Laterality: Right;  WOUND EXPLORATION RIGHT CHEST WALL   NASAL SEPTUM SURGERY  1981   RADIOACTIVE SEED GUIDED AXILLARY SENTINEL LYMPH NODE Right 11/27/2023   Procedure: RIGHT BREAST LUMPECTOMY AFTER MAGNETIC SEED LOCALIZATION AND SENTINEL LYMPH NODE BIOPSY;  Surgeon: Belinda Cough, MD;  Location: [facility];  Service: General;  Laterality: Right;  GEN w/PEC BLOCK RIGHT BREAST SEED LOCALIZED LUMPECTOMY WITH AXILLARY SENTINEL LYMPH NODE BIOPSY   ROTATOR CUFF REPAIR W/ DISTAL CLAVICLE EXCISION Left 08/18/2008   @[facility]  by dr amy   SHOULDER ARTHROSCOPY W/ LABRAL REPAIR Left 06/07/2008   @[facility] ;  and SAD  by dr amy   TONSILLECTOMY  1964   TUBAL LIGATION Bilateral 1999    SOCIAL HISTORY: Social History   Socioeconomic History   Marital status: Married    Spouse name: Not on file   Number of children: 2   Years of education: Not on file   Highest education level: Not on file  Occupational History   Not on file  Tobacco Use   Smoking status: Former    Current packs/day: 0.00    Types: Cigarettes    Start date: 09/13/1975    Quit date: 09/12/2005    Years since quitting: 18.7   Smokeless tobacco: Never  Vaping Use   Vaping status: Never Used   Substance and Sexual Activity   Alcohol use: Yes    Comment: occais   Drug use: No   Sexual activity: Not Currently    Birth control/protection: Surgical    Comment: hyst  Other Topics Concern   Not on file  Social History Narrative   Right hand   Lives with husband   One story home   Does work in airline pilot   Drinks caffeine   Social Drivers of Health   Financial Resource Strain: Not on file  Food Insecurity: No Food Insecurity (11/19/2023)   Hunger Vital Sign    Worried About Running Out of Food in the Last Year: Never true    Ran Out of Food in the Last Year: Never true  Transportation Needs: No Transportation Needs (11/19/2023)  PRAPARE - Administrator, Civil Service (Medical): No    Lack of Transportation (Non-Medical): No  Physical Activity: Not on file  Stress: Not on file  Social Connections: Unknown (12/18/2021)   [facility] from [facility]   Social Network    Social Network: Not on file  Intimate Partner Violence: Unknown (11/09/2021)   Received from [facility]   HITS    Physically Hurt: Not on file    Insult or Talk Down To: Not on file    Threaten Physical Harm: Not on file    Scream or Curse: Not on file    FAMILY HISTORY: Family History  Problem Relation Age of Onset   Lung cancer Mother 81   Diabetes Mother    Thalassemia Mother    Colon polyps Mother    Atrial fibrillation Father    Lung cancer Father 46   Colon cancer Father 90   Bone cancer Father 67   Prostate cancer Father        dx. ?, patient reports all cancers were separate primaries   Thalassemia Sister    Lymphoma Brother 36   Thalassemia Maternal Aunt    Heart disease Paternal Aunt    Colon cancer Paternal Aunt        dx. >50   Uterine cancer Paternal Aunt        dx. >50   Colon cancer Paternal Aunt        dx. >50   Uterine cancer Paternal Aunt        dx. >50   Colon cancer Paternal Grandmother 85 - 52   Colon cancer Cousin 31 - 27       maternal first cousin    Colon cancer Cousin 64 - 30       paternal first cousin   Rectal cancer Neg Hx    Stomach cancer Neg Hx     Review of Systems  Constitutional:  Negative for appetite change, chills, fatigue, fever and unexpected weight change.  HENT:   Negative for hearing loss, lump/mass and trouble swallowing.   Eyes:  Negative for eye problems and icterus.  Respiratory:  Negative for chest tightness, cough and shortness of breath.   Cardiovascular:  Negative for chest pain, leg swelling and palpitations.  Gastrointestinal:  Negative for abdominal distention, abdominal pain, constipation, diarrhea, nausea and vomiting.  Endocrine: Negative for hot flashes.  Genitourinary:  Negative for difficulty urinating.   Musculoskeletal:  Negative for arthralgias.  Skin:  Negative for itching and rash.  Neurological:  Negative for dizziness, extremity weakness, headaches and numbness.  Hematological:  Negative for adenopathy. Does not bruise/bleed easily.  Psychiatric/Behavioral:  Negative for depression. The patient is not nervous/anxious.       PHYSICAL EXAMINATION    Vitals:   05/26/24 1023  BP: 131/84  Pulse: 76  Resp: 18  Temp: 97.8 F (36.6 C)  SpO2: 99%    Physical Exam Constitutional:      General: She is not in acute distress.    Appearance: Normal appearance. She is not toxic-appearing.  HENT:     Head: Normocephalic and atraumatic.     Mouth/Throat:     Mouth: Mucous membranes are moist.     Pharynx: Oropharynx is clear. No oropharyngeal exudate or posterior oropharyngeal erythema.  Eyes:     General: No scleral icterus. Cardiovascular:     Rate and Rhythm: Normal rate and regular rhythm.     Pulses: Normal pulses.  Heart sounds: Normal heart sounds.  Pulmonary:     Effort: Pulmonary effort is normal.     Breath sounds: Normal breath sounds.  Abdominal:     General: Abdomen is flat. Bowel sounds are normal. There is no distension.     Palpations: Abdomen is soft.      Tenderness: There is no abdominal tenderness.  Musculoskeletal:        General: No swelling.     Cervical back: Neck supple.  Lymphadenopathy:     Cervical: No cervical adenopathy.  Skin:    General: Skin is warm and dry.     Findings: No rash.  Neurological:     General: No focal deficit present.     Mental Status: She is alert.  Psychiatric:        Mood and Affect: Mood normal.        Behavior: Behavior normal.     LABORATORY DATA:  CBC    Component Value Date/Time   WBC 6.1 03/24/2024 0833   RBC 4.62 03/24/2024 0833   HGB 13.3 03/24/2024 0833   HGB 14.0 02/16/2024 1313   HGB 13.8 07/17/2020 0958   HCT 40.2 03/24/2024 0833   HCT 42.2 07/17/2020 0958   PLT 239.0 03/24/2024 0833   PLT 167 02/16/2024 1313   PLT 240 07/17/2020 0958   MCV 87.0 03/24/2024 0833   MCV 86 07/17/2020 0958   MCH 29.7 02/16/2024 1313   MCHC 33.1 03/24/2024 0833   RDW 13.5 03/24/2024 0833   RDW 13.3 07/17/2020 0958   LYMPHSABS 0.8 02/16/2024 1313   MONOABS 1.0 02/16/2024 1313   EOSABS 0.1 02/16/2024 1313   BASOSABS 0.1 02/16/2024 1313    CMP     Component Value Date/Time   NA 138 03/24/2024 0833   NA 138 03/27/2022 0834   K 4.0 03/24/2024 0833   CL 101 03/24/2024 0833   CO2 28 03/24/2024 0833   GLUCOSE 89 03/24/2024 0833   BUN 17 03/24/2024 0833   BUN 20 03/27/2022 0834   CREATININE 1.03 03/24/2024 0833   CREATININE 1.13 (H) 02/16/2024 1313   CREATININE 0.86 03/03/2020 0824   CALCIUM  9.6 03/24/2024 0833   PROT 7.4 03/24/2024 0833   PROT 6.9 11/23/2021 0851   ALBUMIN 4.2 03/24/2024 0833   ALBUMIN 4.5 11/23/2021 0851   AST 15 03/24/2024 0833   AST 10 (L) 02/16/2024 1313   ALT 11 03/24/2024 0833   ALT 11 02/16/2024 1313   ALKPHOS 81 03/24/2024 0833   BILITOT 0.6 03/24/2024 0833   BILITOT 0.9 02/16/2024 1313   GFRNONAA 54 (L) 02/16/2024 1313   GFRNONAA 88 11/23/2012 1524   GFRAA 81 08/03/2020 1002   GFRAA >89 11/23/2012 1524     ASSESSMENT and THERAPY PLAN:    Assessment and Plan Assessment & Plan Stage 1A right breast invasive ductal carcinoma, ER/PR positive, status post lumpectomy and radiation Status post lumpectomy and radiation. Dissatisfaction with previous surgical care noted. - Schedule follow-up with Dr. Loretha in six months. - Continue annual mammograms.  Aromatase inhibitor (anastrozole ) intolerance Severe intolerance to anastrozole  with joint pain. Letrozole chosen for similar efficacy and potential better tolerability. Tamoxifen discussed as alternative. - Prescribe a 30-day supply of letrozole. - Instruct to contact the office if letrozole is not tolerated. - If letrozole is tolerated, instruct pharmacy to send a request for a 90-day supply.  Obesity Ongoing obesity management with active weight loss efforts. Previously lost weight on tirzepatide , now on Wegovy  due to insurance. - Continue  current weight loss efforts, including exercise and dietary modifications.   RTC in 6 months for continued f/u and surveillance.    All questions were answered. The patient knows to call the clinic with any problems, questions or concerns. We can certainly see the patient much sooner if necessary.  Total encounter time:30 minutes*in face-to-face visit time, chart review, lab review, care coordination, order entry, and documentation of the encounter time.    Morna Kendall, NP 05/26/24 10:29 AM Medical Oncology and Hematology [facility] [address] [place], [place] [address] Tel. [phone]    Fax. [phone]  *Total Encounter Time as defined by the Centers for Medicare and Medicaid Services includes, in addition to the face-to-face time of a patient visit (documented in the note above) non-face-to-face time: obtaining and reviewing outside history, ordering and reviewing medications, tests or procedures, care coordination (communications with other health care professionals or caregivers) and documentation in the  medical record.

## 2024-05-27 ENCOUNTER — Ambulatory Visit

## 2024-05-28 DIAGNOSIS — R55 Syncope and collapse: Secondary | ICD-10-CM | POA: Diagnosis not present

## 2024-06-01 ENCOUNTER — Ambulatory Visit

## 2024-06-01 DIAGNOSIS — R293 Abnormal posture: Secondary | ICD-10-CM

## 2024-06-01 DIAGNOSIS — I890 Lymphedema, not elsewhere classified: Secondary | ICD-10-CM

## 2024-06-01 DIAGNOSIS — G8929 Other chronic pain: Secondary | ICD-10-CM

## 2024-06-01 DIAGNOSIS — C50411 Malignant neoplasm of upper-outer quadrant of right female breast: Secondary | ICD-10-CM

## 2024-06-01 DIAGNOSIS — N63 Unspecified lump in unspecified breast: Secondary | ICD-10-CM

## 2024-06-01 NOTE — Progress Notes (Signed)
Patient has been notified directly; all questions, if any, were answered. Patient voiced understanding.   

## 2024-06-01 NOTE — Therapy (Signed)
 OUTPATIENT PHYSICAL THERAPY BREAST CANCER TREATMENT   Patient Name: [name] MRN: [id number] DOB:May 13, 1956, 68 y.o., female Today's Date: 06/01/2024  END OF SESSION:  PT End of Session - 06/01/24 0805     Visit Number 13    Number of Visits 21    Date for Recertification  06/15/24    PT Start Time 0801    PT Stop Time 0900    PT Time Calculation (min) 59 min    Activity Tolerance Patient tolerated treatment well    Behavior During Therapy [facility] for tasks assessed/performed           Past Medical History:  Diagnosis Date   Arthritis    neck, knees, ankles, feet, back   Asthma, mild intermittent    prn inhaler   Breast cancer (HCC)    Cervical spondylosis    Chronic dyspnea    per pt gets winded w/ stairs, but recovers quickly   Diverticular disease of colon    followed by dr kristie (GI)---  chronic w/ chronic diarrhea   Essential hypertension    Fatty infiltration of liver    followed by dr kristie--- abd ultrasound in epic 08-31-2020   Frequency of urination    History of chest pain    evaluated by cardiologist-- dr pietro,  11/ 2017 nuclear stress test suggest ischemia,  06/2016 cardiac cath , showed normal coronary anatomy and lvf   History of COVID-19    per pt in 2019   treated with presumed covid had  mild to moderate symptoms that resolved   History of diverticulitis of colon    05/ 2022   History of radiation therapy    Right breast-12/30/23-01/27/24-Dr. Lynwood Nasuti   History of TIA (transient ischemic attack) 06/2003   Hyperlipidemia    Hypertension    Limitation of joint motion of neck    per pt due to bone spurs C2-5   Mild intermittent asthma    followed by pcp   PMB (postmenopausal bleeding)    Pre-diabetes    Pulmonary nodule    followed by pcp,  last hest CT in epic 04-21-2018   Stenosis of cervix    SUI (stress urinary incontinence, female)    Thickened endometrium    TMJ syndrome    Wears glasses    Past Surgical History:   Procedure Laterality Date   APPENDECTOMY  1979   BREAST ENHANCEMENT SURGERY Bilateral 1988   per pt implants removed 2002   BUNIONECTOMY Left 07/17/2007   @[facility]    BUNIONECTOMY Right 05/26/2014   Procedure: RIGHT FOOT LAPIDUS BUNION CORRECTION AMD MODIFIED MCBRIDE BUNIONECTOMY;  Surgeon: Norleen Armor, MD;  Location: [facility];  Service: Orthopedics;  Laterality: Right;   CARDIAC CATHETERIZATION N/A 06/28/2016   Procedure: Left Heart Cath and Coronary Angiography;  Surgeon: Peter M Jordan, MD;  Location: [facility] INVASIVE CV LAB;  Service: Cardiovascular;  Laterality: N/A;   CESAREAN SECTION  1986   COLONOSCOPY  12/27/2020   by dr kristie   FINGER SURGERY Left 07/28/2015   @[facility]  :  left thumb knuckle collasped  arthroplasty   FOOT HARDWARE REMOVAL  04/25/2016   @[facility] ;   right foot   FOOT SURGERY Right 04/04/2015   @[facility] ;   first and second toe's   GANGLION CYST EXCISION Right 2016   wrist   HYSTEROSCOPY WITH D & C N/A 05/24/2021   Procedure: DILATATION AND CURETTAGE /HYSTEROSCOPY and MyoSure;  Surgeon: Rendell Calton LABOR, DO;  Location: [facility];  Service: Gynecology;  Laterality: N/A;   INCISION AND DRAINAGE, ABSCESS, BREAST Right 03/05/2024   Procedure: INCISION AND DRAINAGE, ABSCESS, BREAST;  Surgeon: Belinda Cough, MD;  Location: [facility];  Service: General;  Laterality: Right;   IRRIGATION AND DEBRIDEMENT HEMATOMA Right 03/05/2024   Procedure: IRRIGATION AND DEBRIDEMENT HEMATOMA;  Surgeon: Belinda Cough, MD;  Location: [facility];  Service: General;  Laterality: Right;  WOUND EXPLORATION RIGHT CHEST WALL   NASAL SEPTUM SURGERY  1981   RADIOACTIVE SEED GUIDED AXILLARY SENTINEL LYMPH NODE Right 11/27/2023   Procedure: RIGHT BREAST LUMPECTOMY AFTER MAGNETIC SEED LOCALIZATION AND SENTINEL LYMPH NODE BIOPSY;  Surgeon: Belinda Cough, MD;  Location: [facility];  Service: General;  Laterality: Right;  GEN w/PEC  BLOCK RIGHT BREAST SEED LOCALIZED LUMPECTOMY WITH AXILLARY SENTINEL LYMPH NODE BIOPSY   ROTATOR CUFF REPAIR W/ DISTAL CLAVICLE EXCISION Left 08/18/2008   @[facility]  by dr amy   SHOULDER ARTHROSCOPY W/ LABRAL REPAIR Left 06/07/2008   @[facility] ;  and SAD  by dr amy   TONSILLECTOMY  1964   TUBAL LIGATION Bilateral 1999   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   Spondylolisthesis of lumbar region 04/29/2017   Spondylosis without myelopathy or radiculopathy, cervical region 04/29/2017   Pain from implanted hardware 04/04/2016   Obesity 03/19/2016   Primary osteoarthritis of first carpometacarpal joint of left hand 07/13/2015   History of TIA (transient ischemic attack) 03/14/2015   Metatarsalgia of right foot 03/08/2015   Routine general medical examination at a health care facility 11/23/2012   Essential hypertension 06/16/2008   Hyperlipidemia 12/29/2007    PCP:   REFERRING PROVIDER: Lauraine Golden, MD  REFERRING DIAG: s/p Right Breast Cancer  THERAPY DIAG:  Malignant neoplasm of upper-outer quadrant of right breast in female, estrogen receptor positive (HCC)  Abnormal posture  Chronic right shoulder pain  Swelling of breast  Lymphedema, not elsewhere classified  Rationale for Evaluation and Treatment: Rehabilitation  ONSET DATE: 01/06/2024 SUBJECTIVE:  SUBJECTIVE STATEMENT: I think I've been stretching too much lately. My Rt shoulder has been achy the last few days. I think I've just been overdoing it with my stretches before I came back because I know I missed last week and I was trying to make up for it.   05/04/2024 Re-EVAL I had the I and D and it was some better, but now I am being bothered by pain at the 5:00 position of my breast, not along the crease like the MD says. The MD said I don't need to wear my compression bra anymore. Pain in the axilla and breast is a 7/10. I can't raise my arm because the cording pulls down into the breast.  I am not having the swelling in the upper breast anymore, but now at 5:00 position I have swelling and firmness. It is tender and sore. I will get an US  on Oct 9.  02/16/2024 I started having severe left breast swelling and pain on Saturday. My breast looks like a [place] and it had been doing so much better. I had been lifting my 29 year old grandson. It hurts down my right UE into my wrist. I slept 29 hours yesterday, and I had a low grade fever yesterday and chills. I have been nauseated as well, and threw up one time. I called the nurse line and they suggested I go to the ED, but I didn't want to do that.   PERTINENT HISTORY:  Patient was diagnosed on 10/24/2023 with right grade 2 invasive ductal carcinoma breast cancer. It measures 9 mm and is located in the upper outer quadrant. It is ER/PR positive and HER2 negative with a Ki67 of 20%. She had a Right lumpectomy with SLNB on 11/27/2023 with 0+/2 LN's. She developed a seroma in the axilla and lateral breast and had 160 c of fluid drained on 12/08/2023.  5 days later they drained and got 120 cc,She had another 10 cc drained on 12/22/2023. She is presently undergoing radiation.She has a history of endometrial cancer in 2022 and a history of a TIA   05/04/2024; pt is now post radiation, last seen in therapy on 02/16/24 and advised to see MD for rapid increase in swelling and fever. On 02/18/2024 had 60 cc aspirated from seroma. The seroma re-accumulated and was aspirated of 150 cc with US  guidance. In a week it had re-accumulated again and pt underwent I and D on 03/05/2024 and had a drain placed. Drain was removed on 04/06/2024. When MD saw pt pain had improved and wound was healed but she was feeling some firmness in the inferior breast that was going to be evaluated by US . She continues with complaints of cording in the axilla/upper arm.  PATIENT GOALS:  Reassess how my recovery is going related to arm function, pain, and swelling.  PAIN:  Are you having pain? PAIN:  Are you having pain? Yes NPRS scale: 4/10 right breast/arm cording Pain location: shoulder Pain orientation: Right  PAIN TYPE: achy Pain description: achiness in shoulder Aggravating factors: possibly over stretching past few days Relieving factors: it was feeling better    PRECAUTIONS: Recent Surgery, right UE Lymphedema risk, Prior TIA  RED FLAGS: None   ACTIVITY LEVEL / LEISURE: walking   OBJECTIVE:   PATIENT SURVEYS:  QUICK DASH: EVAL 31.82,  05/04/2024; 31.82 BREAST COMPLAINTS QUESTIONNAIRE (9/30) Pain:7 Heaviness: 7 Swollen feeling: 7 Tense Skin: 7 Redness: 7 Bra Print: 0 Size of Pores: 3 Hard feeling: 7 Total:  45/80 A Score over 9 indicates lymphedema issues in the breast   OBSERVATIONS: EVAL: Large area of swelling with induration right upper breast.(05/04/2024)  Large indention noted at lateral breast (see photo in media) from where drain was in after I and D. Fibrosis noted inferior medial right breast with tenderness at lower breast above fibrosis  POSTURE:  Forward head and rounded shoulders posture    LYMPHEDEMA ASSESSMENT:   UPPER EXTREMITY AROM/PROM:   A/PROM RIGHT   eval   RIGHT 01/13/2024 RIGHT 05/04/2024  Shoulder extension  57 45 42  Shoulder flexion 160 140 125 with pulling  Shoulder abduction 169 115 tight under arm 90 with pulling  Shoulder internal rotation 67 45   Shoulder external rotation 82 60                           (Blank rows = not tested)   A/PROM LEFT   eval  Shoulder extension 48  Shoulder flexion 137  Shoulder abduction 168  Shoulder internal rotation 57  Shoulder external rotation 75                          (Blank rows = not tested)   CERVICAL AROM: All within normal limits   UPPER EXTREMITY STRENGTH: WNL   LYMPHEDEMA ASSESSMENTS (in cm):    LANDMARK RIGHT   eval RIGHT 01/13/2024  10 cm proximal to olecranon process 32.9 33.8  Olecranon process 25 24.7  10 cm proximal to ulnar styloid process 24.5 23.0  Just proximal to ulnar styloid process 16.9 17.4  Across hand at thumb web space 19.5 19.6  At base of 2nd digit 6.6 6.3  (Blank rows = not tested)   LANDMARK LEFT   eval LEFT 01/13/2024  10 cm proximal to olecranon process 35 34.6  Olecranon process 25.3 25.5  10 cm proximal to ulnar styloid process 23 22.7  Just proximal to ulnar styloid process 16.4 16.5  Across hand at thumb web space 18.1 18.7  At base of 2nd digit 6.5 6.4  (Blank rows = not tested)    Surgery type/Date: right breast lumpectomy and sentinel lymph node biopsy on 11/27/2023.  Number of lymph nodes removed: 0+/2 Current/past treatment (chemo, radiation, hormone therapy): radiation Other symptoms:  Heaviness/tightness No Pain Yes Pitting edema No Infections No, 05/04/2024 Yes; left Breast treated for initially, then antibiotics held when aspirations occurred Decreased scar mobility Yes Stemmer sign No   TODAY'S TREATMENT:  06/01/24: Therapeutic Exercises Pulleys briefly into flex x 1 min and then same into abd instructing pt to focus on relaxing shoulder to try to counteract achiness Roll large green ball on mat table into flex but only min stretch felt so stopped, Rt UE abd x 5 each Lt S/L  for Rt open book stretch x 3 reps with 10 sec holds returning therapist demo and VC's to focus on rotating at T-Spine Therapeutic Activities Supine over full foam roll: Rt UE scaption in a V x 15, and Rt UE abd in a snow angel x 15, 5 sec holds Free Motion Machine: Scapular Retraction 7#, x 20 Manual therapy MFR for Cording release to right axilla/breast and in pect tendon in supine STM to right intercostals and pect insertion; assessed lateral trunk but no tightness or tenderness here today Lt S/L for Rt scap mobs into protraction/retraction and just trying to lift away from the ribs as her scap mobility is  very limited due to tightness PROM Right shoulder flexion, abduction, and D2 with scapular depression throughout all manual therapy; contract/relax with Rt hand behind head and pressure to medial elbow resisting adduction x 3 reps, then prolonged holds after each rep and followed with increased MFR to taut pect tendon  05/18/24: Therapeutic Exercises Pulleys into flex and abd but no stretch felt so stopped Roll yellow ball up wall into flex and Rt abd x 10 each Therapeutic Activities Free Motion Machine: Scapular Retraction 7#, 2 x 10 returning therapist demo Supine over half foam roll: Rt UE scaption in a V x 10, and Rt UE abd in a snow angel x 10, 5 sec holds Manual therapy MFR for Cording release to right axilla/breast in supine STM to right UT, pectorals and lateral trunk with cocoa butter Dynamic cupping to right upper arm area of cording with 2nd smallest cup using cocoa butter for glide PROM Right shoulder flexion, abduction, and D2 with scapular depression throughout all manual thearpy  05/11/24: Therapeutic Exercises Pulleys into flex x 2 mins, increased Lt shoulder pain with pulling Rt UE so stopped Roll yellow ball up wall into flex and Rt abd x 10 each Therapeutic Activities Supine over half foam roll: Rt UE horz abd but no stretch felt so stopped, then Rt UE  scaption x 10, and Rt UE abd in a snow angel x 10, 5 sec holds (did not do Lt UE due to increased pain from chronic issues), pt reports great stretches with these Manual therapy MFR for Cording release to right axilla/breast in supine STM to right UT, pectorals and lateral trunk with cocoa butter Dynamic cupping to right upper arm area of cording with 2nd largest clear cup using cocoa butter for glide PROM Right shoulder flexion, abduction, and D2 with scapular depression throughout all manual therapy      PATIENT EDUCATION:  Education details: POC, LOS, treatment interventions, 4 post op exercises, NTS for cords Person educated: Patient Education method: Programmer, Multimedia, Demonstration, Verbal cues, and Handouts Education comprehension: verbalized understanding, returned demonstration, and verbal cues required  HOME EXERCISE PROGRAM: Instructed 4 post op exercises with pt holding Right wrist with left hand for AA flexion in supine, supine stargazer, scapular retraction, wall slides. Showed wrist extension with elbow ext and gentle shoulder ext to stretch cords. Advised to do with appropriate tightness only and not to force. Should not have pain in shoulder. To perform 2x's per day. Also educated in scar massage to incision area, and advised to watch ABC video.   ASSESSMENT:  CLINICAL IMPRESSION: Pt comes in reporting some increased achiness in Rt shoulder. But she reports she has been increasing her stretches past few days trying to make up for missing last week so she thinks she may have just over done it. Today continued with focus on end AA/A/ROM for Rt shoulder. Pt reports she feels her end ROM doesn't limit her now as it used to. Answered her questions during MT about how she will always have some tightness due to second surgery to remove hematoma so there was more trauma than normal. However, also explained to her that as long as she conts to incorporate OH and end ROM stretching for  her Rt shoulder that she should cont to not feel limited by tightness and she will prevent it from worsening or tightening back down over the years. Pt verbalized good understanding. The redness noted at Rt breast at last session was much improved today.  Pt will benefit from skilled therapeutic intervention to improve on the following deficits: Decreased knowledge of precautions, impaired UE functional use, pain, decreased ROM, postural dysfunction.   PT treatment/interventions: ADL/Self care home management, [phone]- PT Re-evaluation, [phone]- Physical Performance Testing, 97110-Therapeutic exercises, 97530- Therapeutic activity, [id number]- Neuromuscular re-education, 97535- Self Care, [id number]- Manual therapy, 97760- Orthotic Initial, [phone]- Orthotic/Prosthetic subsequent, and Patient/Family education   GOALS: Goals reviewed with patient? Yes  LONG TERM GOALS:  (STG=LTG)  GOALS Name Target Date  Goal status  1 Pt will demonstrate she has regained full shoulder ROM and function post operatively compared to baselines.  Baseline: 02/24/2024 INITIAL  2 Pt will be independent in Right shoulder ROM and strengthening 02/24/2024 06/15/2024 INITIAL  3 Pt will be independent in right breast MLD to decrease breast swelling 02/24/2024 06/15/2024 INITIAL  4 Pt will be compliant with compression bra to decrease right breast swelling 02/24/2024 06/15/2024 INITIAL  5. Quick dash will be no greater than 15% to demonstrate improved UE function  06/15/2024 NEW     PLAN:  PT FREQUENCY/DURATION: 2x/week x 6 weeks  PLAN FOR NEXT SESSION: Cont ball roll up wall (may D/C pulleys as pt doesn't feel much stretch any longer and it flares up Lt to use), progress HEP prn to include supine scapular series and progress AA/A/ROM stretches  Cont and review MLD and cont MFR to cords   [facility]  [address], [address]  [place] [address]  [phone]   Aden Berwyn Caldron,  PTA 06/01/2024, 9:28 AM

## 2024-06-01 NOTE — Telephone Encounter (Signed)
 Patient appointment scheduled for 6 months. Patient aware of date and time.

## 2024-06-04 ENCOUNTER — Ambulatory Visit: Payer: Self-pay

## 2024-06-07 ENCOUNTER — Ambulatory Visit: Payer: Self-pay | Attending: Radiation Oncology

## 2024-06-07 DIAGNOSIS — Z170 Estrogen receptor positive status [ER+]: Secondary | ICD-10-CM | POA: Insufficient documentation

## 2024-06-07 DIAGNOSIS — I890 Lymphedema, not elsewhere classified: Secondary | ICD-10-CM | POA: Diagnosis present

## 2024-06-07 DIAGNOSIS — G8929 Other chronic pain: Secondary | ICD-10-CM | POA: Diagnosis present

## 2024-06-07 DIAGNOSIS — C50411 Malignant neoplasm of upper-outer quadrant of right female breast: Secondary | ICD-10-CM | POA: Diagnosis present

## 2024-06-07 DIAGNOSIS — M25511 Pain in right shoulder: Secondary | ICD-10-CM | POA: Diagnosis present

## 2024-06-07 DIAGNOSIS — R293 Abnormal posture: Secondary | ICD-10-CM | POA: Diagnosis present

## 2024-06-07 DIAGNOSIS — N63 Unspecified lump in unspecified breast: Secondary | ICD-10-CM | POA: Insufficient documentation

## 2024-06-07 NOTE — Patient Instructions (Signed)
 Over Head Pull: Narrow and Wide Grip   Cancer Rehab [phone]   On back, knees bent, feet flat, band across thighs, elbows straight but relaxed. Pull hands apart (start). Keeping elbows straight, bring arms up and over head, hands toward floor. Keep pull steady on band. Hold momentarily. Return slowly, keeping pull steady, back to start. Then do same with a wider grip on the band (past shoulder width) Repeat _5-10__ times. Band color __red____   Side Pull: Double Arm   On back, knees bent, feet flat. Arms perpendicular to body, shoulder level, elbows straight but relaxed. Pull arms out to sides, elbows straight. Resistance band comes across collarbones, hands toward floor. Hold momentarily. Slowly return to starting position. Repeat _5-10__ times. Band color _red____   Sword   On back, knees bent, feet flat, left hand on left hip, right hand above left. Pull right arm DIAGONALLY (hip to shoulder) across chest. Bring right arm along head toward floor. Hold momentarily. Slowly return to starting position. Repeat _5-10__ times. Do with left arm. Band color _red_____   Shoulder Rotation: Double Arm   On back, knees bent, feet flat, elbows tucked at sides, bent 90, hands palms up. Pull hands apart and down toward floor, keeping elbows near sides. Hold momentarily. Slowly return to starting position. Repeat _5-10__ times. Band color __red____

## 2024-06-07 NOTE — Therapy (Signed)
 OUTPATIENT PHYSICAL THERAPY BREAST CANCER TREATMENT   Patient Name: [name] MRN: [id number] DOB:[birth year], 68 y.o., female Today's Date: 06/07/2024  END OF SESSION:  PT End of Session - 06/07/24 0805     Visit Number 14    Number of Visits 21    Date for Recertification  06/15/24    PT Start Time 0801    PT Stop Time 0900    PT Time Calculation (min) 59 min    Activity Tolerance Patient tolerated treatment well    Behavior During Therapy [facility] for tasks assessed/performed           Past Medical History:  Diagnosis Date   Arthritis    neck, knees, ankles, feet, back   Asthma, mild intermittent    prn inhaler   Breast cancer (HCC)    Cervical spondylosis    Chronic dyspnea    per pt gets winded w/ stairs, but recovers quickly   Diverticular disease of colon    followed by dr kristie (GI)---  chronic w/ chronic diarrhea   Essential hypertension    Fatty infiltration of liver    followed by dr kristie--- abd ultrasound in epic 08-31-2020   Frequency of urination    History of chest pain    evaluated by cardiologist-- dr pietro,  11/ 2017 nuclear stress test suggest ischemia,  06/2016 cardiac cath , showed normal coronary anatomy and lvf   History of COVID-19    per pt in 2019   treated with presumed covid had  mild to moderate symptoms that resolved   History of diverticulitis of colon    05/ 2022   History of radiation therapy    Right breast-12/30/23-01/27/24-Dr. Lynwood Nasuti   History of TIA (transient ischemic attack) 06/2003   Hyperlipidemia    Hypertension    Limitation of joint motion of neck    per pt due to bone spurs C2-5   Mild intermittent asthma    followed by pcp   PMB (postmenopausal bleeding)    Pre-diabetes    Pulmonary nodule    followed by pcp,  last hest CT in epic 04-21-2018   Stenosis of cervix    SUI (stress urinary incontinence, female)    Thickened endometrium    TMJ syndrome    Wears glasses    Past Surgical History:   Procedure Laterality Date   APPENDECTOMY  1979   BREAST ENHANCEMENT SURGERY Bilateral 1988   per pt implants removed 2002   BUNIONECTOMY Left 07/17/2007   @[facility]    BUNIONECTOMY Right 05/26/2014   Procedure: RIGHT FOOT LAPIDUS BUNION CORRECTION AMD MODIFIED MCBRIDE BUNIONECTOMY;  Surgeon: Norleen Armor, MD;  Location: [facility];  Service: Orthopedics;  Laterality: Right;   CARDIAC CATHETERIZATION N/A 06/28/2016   Procedure: Left Heart Cath and Coronary Angiography;  Surgeon: Peter M Jordan, MD;  Location: [facility];  Service: Cardiovascular;  Laterality: N/A;   CESAREAN SECTION  1986   COLONOSCOPY  12/27/2020   by dr kristie   FINGER SURGERY Left 07/28/2015   @[facility]  :  left thumb knuckle collasped  arthroplasty   FOOT HARDWARE REMOVAL  04/25/2016   @[facility] ;   right foot   FOOT SURGERY Right 04/04/2015   @[facility] ;   first and second toe's   GANGLION CYST EXCISION Right 2016   wrist   HYSTEROSCOPY WITH D & C N/A 05/24/2021   Procedure: DILATATION AND CURETTAGE /HYSTEROSCOPY and MyoSure;  Surgeon: Rendell Calton LABOR, DO;  Location: [facility];  Service: Gynecology;  Laterality: N/A;   INCISION AND DRAINAGE, ABSCESS, BREAST Right 03/05/2024   Procedure: INCISION AND DRAINAGE, ABSCESS, BREAST;  Surgeon: Belinda Cough, MD;  Location: [facility];  Service: General;  Laterality: Right;   IRRIGATION AND DEBRIDEMENT HEMATOMA Right 03/05/2024   Procedure: IRRIGATION AND DEBRIDEMENT HEMATOMA;  Surgeon: Belinda Cough, MD;  Location: [facility];  Service: General;  Laterality: Right;  WOUND EXPLORATION RIGHT CHEST WALL   NASAL SEPTUM SURGERY  1981   RADIOACTIVE SEED GUIDED AXILLARY SENTINEL LYMPH NODE Right 11/27/2023   Procedure: RIGHT BREAST LUMPECTOMY AFTER MAGNETIC SEED LOCALIZATION AND SENTINEL LYMPH NODE BIOPSY;  Surgeon: Belinda Cough, MD;  Location: [facility];  Service: General;  Laterality: Right;  GEN w/PEC  BLOCK RIGHT BREAST SEED LOCALIZED LUMPECTOMY WITH AXILLARY SENTINEL LYMPH NODE BIOPSY   ROTATOR CUFF REPAIR W/ DISTAL CLAVICLE EXCISION Left 08/18/2008   @[facility]  by dr amy   SHOULDER ARTHROSCOPY W/ LABRAL REPAIR Left 06/07/2008   @[facility] ;  and SAD  by dr amy   TONSILLECTOMY  1964   TUBAL LIGATION Bilateral 1999   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   Spondylolisthesis of lumbar region 04/29/2017   Spondylosis without myelopathy or radiculopathy, cervical region 04/29/2017   Pain from implanted hardware 04/04/2016   Obesity 03/19/2016   Primary osteoarthritis of first carpometacarpal joint of left hand 07/13/2015   History of TIA (transient ischemic attack) 03/14/2015   Metatarsalgia of right foot 03/08/2015   Routine general medical examination at a health care facility 11/23/2012   Essential hypertension 06/16/2008   Hyperlipidemia 12/29/2007    PCP:   REFERRING PROVIDER: Lauraine Golden, MD  REFERRING DIAG: s/p Right Breast Cancer  THERAPY DIAG:  Malignant neoplasm of upper-outer quadrant of right breast in female, estrogen receptor positive (HCC)  Abnormal posture  Chronic right shoulder pain  Swelling of breast  Lymphedema, not elsewhere classified  Rationale for Evaluation and Treatment: Rehabilitation  ONSET DATE: 01/06/2024 SUBJECTIVE:  SUBJECTIVE STATEMENT: My Rt shoulder is getting better. I don't feel as much of a stretch now when I do my HEP. I like the weights we did last time. Those felt good.   05/04/2024 Re-EVAL I had the I and D and it was some better, but now I am being bothered by pain at the 5:00 position of my breast, not along the crease like the MD says. The MD said I don't need to wear my compression bra anymore. Pain in the axilla and breast is a 7/10. I can't raise my arm because the cording pulls down into the breast.  I am not having the swelling in the upper breast anymore, but now at 5:00 position I have swelling and firmness. It is tender and sore. I will get an US  on Oct 9.  02/16/2024 I started having severe left breast swelling and pain on Saturday. My breast looks like a [place] and it had been doing so much better. I had been lifting my 60 year old grandson. It hurts down my right UE into my wrist. I slept 29 hours yesterday, and I had a low grade fever yesterday and chills. I have been nauseated as well, and threw up one time. I called the nurse line and they suggested I go to the ED, but I didn't want to do that.   PERTINENT HISTORY:  Patient was diagnosed on 10/24/2023 with right grade 2 invasive ductal carcinoma breast cancer. It measures 9 mm and is located in the upper outer quadrant. It is ER/PR positive and HER2 negative with a Ki67 of 20%. She had a Right lumpectomy with SLNB on 11/27/2023 with 0+/2 LN's. She developed a seroma in the axilla and lateral breast and had 160 c of fluid drained on 12/08/2023.  5 days later they drained and got 120 cc,She had another 10 cc drained on 12/22/2023. She is presently undergoing radiation.She has a history of endometrial cancer in 2022 and a history of a TIA  05/04/2024; pt is now post radiation, last seen in therapy on 02/16/24 and advised to see MD for  rapid increase in swelling and fever. On 02/18/2024 had 60 cc aspirated from seroma. The seroma re-accumulated and was aspirated of 150 cc with US  guidance. In a week it had re-accumulated again and pt underwent I and D on 03/05/2024 and had a drain placed. Drain was removed on 04/06/2024. When MD saw pt pain had improved and wound was healed but she was feeling some firmness in the inferior breast that was going to be evaluated by US . She continues with complaints of cording in the axilla/upper arm.  PATIENT GOALS:  Reassess how my recovery is going related to arm function, pain, and swelling.  PAIN:  Are you having pain? No    PRECAUTIONS: Recent Surgery, right UE Lymphedema risk, Prior TIA  RED FLAGS: None   ACTIVITY LEVEL / LEISURE: walking   OBJECTIVE:   PATIENT SURVEYS:  QUICK DASH: EVAL 31.82,  05/04/2024; 31.82 BREAST COMPLAINTS QUESTIONNAIRE (9/30) / 2nd column - 06/07/24 Pain:  7  2 Heaviness:  7  2 Swollen feeling: 7  4 Tense Skin:  7  4 Redness:  7  3 Bra Print:  0  1 Size of Pores:  3  1 Hard feeling:  7  6 Total:      45/80  23/80 A Score over 9 indicates lymphedema issues in the breast   OBSERVATIONS: EVAL: Large area of swelling with induration right upper breast.(05/04/2024)  Large indention noted at lateral breast (see photo in media) from where drain was in after I and D. Fibrosis noted inferior medial right breast with tenderness at lower breast above fibrosis  POSTURE:  Forward head and rounded shoulders posture    LYMPHEDEMA ASSESSMENT:   UPPER EXTREMITY AROM/PROM:   A/PROM RIGHT   eval   RIGHT 01/13/2024 RIGHT 05/04/2024  Shoulder extension 57 45 42  Shoulder flexion 160 140 125 with pulling  Shoulder abduction 169 115 tight under arm 90 with pulling  Shoulder internal rotation 67 45   Shoulder external rotation 82 60                           (Blank rows = not tested)   A/PROM LEFT   eval  Shoulder extension 48  Shoulder flexion 137   Shoulder abduction 168  Shoulder internal rotation 57  Shoulder external rotation 75                          (Blank rows = not tested)   CERVICAL AROM: All within normal limits   UPPER EXTREMITY STRENGTH: WNL   LYMPHEDEMA ASSESSMENTS (in cm):    LANDMARK RIGHT   eval RIGHT 01/13/2024  10 cm proximal to olecranon process 32.9 33.8  Olecranon process 25 24.7  10 cm proximal to ulnar styloid process 24.5 23.0  Just proximal to ulnar styloid process 16.9 17.4  Across hand at thumb web space 19.5 19.6  At base of 2nd digit 6.6 6.3  (Blank rows = not tested)   LANDMARK LEFT   eval LEFT 01/13/2024  10 cm proximal to olecranon process 35 34.6  Olecranon process 25.3 25.5  10 cm proximal to ulnar styloid process 23 22.7  Just proximal to ulnar styloid process 16.4 16.5  Across hand at thumb web space 18.1 18.7  At base of 2nd digit 6.5 6.4  (Blank rows = not tested)    Surgery type/Date: right breast lumpectomy and sentinel lymph node biopsy on 11/27/2023.  Number of lymph nodes removed: 0+/2 Current/past treatment (chemo, radiation, hormone therapy): radiation Other symptoms:  Heaviness/tightness No Pain Yes Pitting edema No Infections No, 05/04/2024 Yes; left Breast treated for initially, then antibiotics held when aspirations occurred Decreased scar mobility Yes Stemmer sign No   TODAY'S TREATMENT: 06/07/24: Therapeutic Exercises Roll yellow ball up wall into Rt UE abd x 7, pt reports mild stretch now felt so stopped, also very mild stretch felt with flex Therapeutic Activities Free Motion Machine: Scapular Retraction 7#, x 20, then bil UE 3#, 2 x 10 Supine Scapular Series with red theraband returning therapist demo: Horz abd, bil er x 10 each, then narrow and wide grip flexion x 5 each and bil D2 x 5 each, handout issued Manual therapy MFR for Cording release to right axilla to incision PROM Right shoulder flexion, abduction, and D2 with scapular depression  throughout all manual therapy  06/01/24: Therapeutic Exercises Pulleys briefly into flex x 1 min and then same into abd instructing pt to focus on relaxing shoulder to try to counteract achiness Roll large green ball on mat table into flex but only min stretch felt so stopped, Rt UE abd x 5 each Lt S/L for Rt open book stretch x 3 reps with 10 sec holds returning therapist demo and VC's to focus on rotating at T-Spine Therapeutic Activities Supine over full foam roll: Rt UE scaption in  a V x 15, and Rt UE abd in a snow angel x 15, 5 sec holds Free Motion Machine: Scapular Retraction 7#, x 20 Manual therapy MFR for Cording release to right axilla/breast and in pect tendon in supine STM to right intercostals and pect insertion; assessed lateral trunk but no tightness or tenderness here today Lt S/L for Rt scap mobs into protraction/retraction and just trying to lift away from the ribs as her scap mobility is very limited due to tightness PROM Right shoulder flexion, abduction, and D2 with scapular depression throughout all manual therapy; contract/relax with Rt hand behind head and pressure to medial elbow resisting adduction x 3 reps, then prolonged holds after each rep and followed with increased MFR to taut pect tendon  05/18/24: Therapeutic Exercises Pulleys into flex and abd but no stretch felt so stopped Roll yellow ball up wall into flex and Rt abd x 10 each Therapeutic Activities Free Motion Machine: Scapular Retraction 7#, 2 x 10 returning therapist demo Supine over half foam roll: Rt UE scaption in a V x 10, and Rt UE abd in a snow angel x 10, 5 sec holds Manual therapy MFR for Cording release to right axilla/breast in supine STM to right UT, pectorals and lateral trunk with cocoa butter Dynamic cupping to right upper arm area of cording with 2nd smallest cup using cocoa butter for glide PROM Right shoulder flexion, abduction, and D2 with scapular depression throughout all  manual thearpy  05/11/24: Therapeutic Exercises Pulleys into flex x 2 mins, increased Lt shoulder pain with pulling Rt UE so stopped Roll yellow ball up wall into flex and Rt abd x 10 each Therapeutic Activities Supine over half foam roll: Rt UE horz abd but no stretch felt so stopped, then Rt UE scaption x 10, and Rt UE abd in a snow angel x 10, 5 sec holds (did not do Lt UE due to increased pain from chronic issues), pt reports great stretches with these Manual therapy MFR for Cording release to right axilla/breast in supine STM to right UT, pectorals and lateral trunk with cocoa butter Dynamic cupping to right upper arm area of cording with 2nd largest clear cup using cocoa butter for glide PROM Right shoulder flexion, abduction, and D2 with scapular depression throughout all manual therapy      PATIENT EDUCATION:  Education details: Supine scapular series with red theraband Person educated: Patient Education method: Programmer, Multimedia, Demonstration, Verbal cues, and Handouts Education comprehension: verbalized understanding, returned demonstration, and verbal cues required  HOME EXERCISE PROGRAM: Instructed 4 post op exercises with pt holding Right wrist with left hand for AA flexion in supine, supine stargazer, scapular retraction, wall slides. Showed wrist extension with elbow ext and gentle shoulder ext to stretch cords. Advised to do with appropriate tightness only and not to force. Should not have pain in shoulder. To perform 2x's per day. Also educated in scar massage to incision area, and advised to watch ABC video.   ASSESSMENT:  CLINICAL IMPRESSION: Pt wanted to focus more on strength today as she reports not feeling much of a stretch now with stretches. Continued with postural strength with weights and then added supine scapular series with red theraband which she was challenged by but tolerated fairly well.     Pt will benefit from skilled therapeutic intervention to  improve on the following deficits: Decreased knowledge of precautions, impaired UE functional use, pain, decreased ROM, postural dysfunction.   PT treatment/interventions: ADL/Self care home management, [phone]- PT Re-evaluation,  02249- Physical Performance Testing, 97110-Therapeutic exercises, 97530- Therapeutic activity, [id number]- Neuromuscular re-education, 97535- Self Care, [id number]- Manual therapy, [id number]- Orthotic Initial, [id number]- Orthotic/Prosthetic subsequent, and Patient/Family education   GOALS: Goals reviewed with patient? Yes  LONG TERM GOALS:  (STG=LTG)  GOALS Name Target Date  Goal status  1 Pt will demonstrate she has regained full shoulder ROM and function post operatively compared to baselines.  Baseline: 02/24/2024 INITIAL  2 Pt will be independent in Right shoulder ROM and strengthening 02/24/2024 06/15/2024 INITIAL  3 Pt will be independent in right breast MLD to decrease breast swelling 02/24/2024 06/15/2024 INITIAL  4 Pt will be compliant with compression bra to decrease right breast swelling 02/24/2024 06/15/2024 INITIAL  5. Quick dash will be no greater than 15% to demonstrate improved UE function  06/15/2024 NEW     PLAN:  PT FREQUENCY/DURATION: 2x/week x 6 weeks  PLAN FOR NEXT SESSION: Cont ball roll up wall if pt wants (stop pulleys due to no stretch felt and pt request) review supine scapular series and progress AA/A/ROM stretches  Cont and review MLD and cont MFR to cords; pt will be reday for D/C per POC next week.    [facility]  [address], [address]  [place] [address]  [phone]   Aden Berwyn Caldron, PTA 06/07/2024, 1:29 PM

## 2024-06-08 ENCOUNTER — Ambulatory Visit: Payer: Self-pay

## 2024-06-09 ENCOUNTER — Ambulatory Visit: Payer: Self-pay

## 2024-06-09 DIAGNOSIS — C50411 Malignant neoplasm of upper-outer quadrant of right female breast: Secondary | ICD-10-CM | POA: Diagnosis not present

## 2024-06-09 DIAGNOSIS — Z170 Estrogen receptor positive status [ER+]: Secondary | ICD-10-CM

## 2024-06-09 DIAGNOSIS — R293 Abnormal posture: Secondary | ICD-10-CM

## 2024-06-09 DIAGNOSIS — N63 Unspecified lump in unspecified breast: Secondary | ICD-10-CM

## 2024-06-09 DIAGNOSIS — G8929 Other chronic pain: Secondary | ICD-10-CM

## 2024-06-09 DIAGNOSIS — I890 Lymphedema, not elsewhere classified: Secondary | ICD-10-CM

## 2024-06-09 NOTE — Therapy (Signed)
 OUTPATIENT PHYSICAL THERAPY BREAST CANCER TREATMENT   Patient Name: [name] MRN: [id number] DOB:1955/11/17, 68 y.o., female Today's Date: 06/09/2024  END OF SESSION:  PT End of Session - 06/09/24 0759     Visit Number 15    Number of Visits 21    Date for Recertification  06/15/24    PT Start Time 0801    PT Stop Time 0844   pt requested to leave early for an appt   PT Time Calculation (min) 43 min    Activity Tolerance Patient tolerated treatment well    Behavior During Therapy [facility] for tasks assessed/performed           Past Medical History:  Diagnosis Date   Arthritis    neck, knees, ankles, feet, back   Asthma, mild intermittent    prn inhaler   Breast cancer (HCC)    Cervical spondylosis    Chronic dyspnea    per pt gets winded w/ stairs, but recovers quickly   Diverticular disease of colon    followed by dr kristie (GI)---  chronic w/ chronic diarrhea   Essential hypertension    Fatty infiltration of liver    followed by dr kristie--- abd ultrasound in epic 08-31-2020   Frequency of urination    History of chest pain    evaluated by cardiologist-- dr pietro,  11/ 2017 nuclear stress test suggest ischemia,  06/2016 cardiac cath , showed normal coronary anatomy and lvf   History of COVID-19    per pt in 2019   treated with presumed covid had  mild to moderate symptoms that resolved   History of diverticulitis of colon    05/ 2022   History of radiation therapy    Right breast-12/30/23-01/27/24-Dr. Lynwood Nasuti   History of TIA (transient ischemic attack) 06/2003   Hyperlipidemia    Hypertension    Limitation of joint motion of neck    per pt due to bone spurs C2-5   Mild intermittent asthma    followed by pcp   PMB (postmenopausal bleeding)    Pre-diabetes    Pulmonary nodule    followed by pcp,  last hest CT in epic 04-21-2018   Stenosis of cervix    SUI (stress urinary incontinence, female)    Thickened endometrium    TMJ syndrome    Wears  glasses    Past Surgical History:  Procedure Laterality Date   APPENDECTOMY  1979   BREAST ENHANCEMENT SURGERY Bilateral 1988   per pt implants removed 2002   BUNIONECTOMY Left 07/17/2007   @[facility]    BUNIONECTOMY Right 05/26/2014   Procedure: RIGHT FOOT LAPIDUS BUNION CORRECTION AMD MODIFIED MCBRIDE BUNIONECTOMY;  Surgeon: Norleen Armor, MD;  Location: [facility];  Service: Orthopedics;  Laterality: Right;   CARDIAC CATHETERIZATION N/A 06/28/2016   Procedure: Left Heart Cath and Coronary Angiography;  Surgeon: Peter M Jordan, MD;  Location: [facility] INVASIVE CV LAB;  Service: Cardiovascular;  Laterality: N/A;   CESAREAN SECTION  1986   COLONOSCOPY  12/27/2020   by dr kristie   FINGER SURGERY Left 07/28/2015   @[facility]  :  left thumb knuckle collasped  arthroplasty   FOOT HARDWARE REMOVAL  04/25/2016   @[facility] ;   right foot   FOOT SURGERY Right 04/04/2015   @[facility] ;   first and second toe's   GANGLION CYST EXCISION Right 2016   wrist   HYSTEROSCOPY WITH D & C N/A 05/24/2021   Procedure: DILATATION AND CURETTAGE /HYSTEROSCOPY and MyoSure;  Surgeon:  Law, Cassandra A, DO;  Location: [facility];  Service: Gynecology;  Laterality: N/A;   INCISION AND DRAINAGE, ABSCESS, BREAST Right 03/05/2024   Procedure: INCISION AND DRAINAGE, ABSCESS, BREAST;  Surgeon: Belinda Cough, MD;  Location: [facility];  Service: General;  Laterality: Right;   IRRIGATION AND DEBRIDEMENT HEMATOMA Right 03/05/2024   Procedure: IRRIGATION AND DEBRIDEMENT HEMATOMA;  Surgeon: Belinda Cough, MD;  Location: [facility];  Service: General;  Laterality: Right;  WOUND EXPLORATION RIGHT CHEST WALL   NASAL SEPTUM SURGERY  1981   RADIOACTIVE SEED GUIDED AXILLARY SENTINEL LYMPH NODE Right 11/27/2023   Procedure: RIGHT BREAST LUMPECTOMY AFTER MAGNETIC SEED LOCALIZATION AND SENTINEL LYMPH NODE BIOPSY;  Surgeon: Belinda Cough, MD;  Location:  SURGERY CENTER;  Service: General;   Laterality: Right;  GEN w/PEC BLOCK RIGHT BREAST SEED LOCALIZED LUMPECTOMY WITH AXILLARY SENTINEL LYMPH NODE BIOPSY   ROTATOR CUFF REPAIR W/ DISTAL CLAVICLE EXCISION Left 08/18/2008   @[facility]  by dr amy   SHOULDER ARTHROSCOPY W/ LABRAL REPAIR Left 06/07/2008   @[facility] ;  and SAD  by dr amy   TONSILLECTOMY  1964   TUBAL LIGATION Bilateral 1999   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   Spondylolisthesis of lumbar region 04/29/2017   Spondylosis without myelopathy or radiculopathy, cervical region 04/29/2017   Pain from implanted hardware 04/04/2016   Obesity 03/19/2016   Primary osteoarthritis of first carpometacarpal joint of left hand 07/13/2015   History of TIA (transient ischemic attack) 03/14/2015   Metatarsalgia of right foot 03/08/2015   Routine general medical examination at a health care facility 11/23/2012   Essential hypertension 06/16/2008   Hyperlipidemia 12/29/2007    PCP:   REFERRING PROVIDER: Lauraine Golden, MD  REFERRING DIAG: s/p Right Breast Cancer  THERAPY DIAG:  Malignant neoplasm of upper-outer quadrant of right breast in female, estrogen receptor positive (HCC)  Abnormal posture  Chronic right shoulder pain  Swelling of breast  Lymphedema, not elsewhere classified  Rationale for Evaluation and Treatment: Rehabilitation  ONSET DATE: 01/06/2024 SUBJECTIVE:  SUBJECTIVE STATEMENT  My shoulder blades  hurt so bad with muscle soreness today. I went to the gym yesterday and did the rowing machine there.I feel like I am doing so much better. I still have the breast swelling. Cording is doing awesome.I stay tight in the axilla, and it feels better when she works on it.I need to leave at 8:45. I am ready to graduate next visit.  05/04/2024 Re-EVAL I had the I and D and it was some better, but now I am being bothered by pain at the 5:00 position of my breast, not along the crease like the MD says. The MD said I don't need to wear my compression bra anymore. Pain in the axilla and breast is a 7/10. I can't raise my arm because the cording pulls down into the breast.  I am not having the swelling in the upper breast anymore, but now at 5:00 position I have swelling and firmness. It is tender and sore. I will get an US  on Oct 9.  02/16/2024 I started having severe left breast swelling and pain on Saturday. My breast looks like a [place] and it had been doing so much better. I had been lifting my 93 year old grandson. It hurts down my right UE into my wrist. I slept 29 hours yesterday, and I had a low grade fever yesterday and chills. I have been nauseated as well, and threw up one time. I called the nurse line and they suggested I go to the ED, but I didn't want to do that.   PERTINENT HISTORY:  Patient was diagnosed on 10/24/2023 with right grade 2 invasive ductal carcinoma breast cancer. It measures 9 mm and is located in the upper outer quadrant. It is ER/PR positive and HER2 negative with a Ki67 of 20%. She had a Right lumpectomy with SLNB on 11/27/2023 with 0+/2 LN's. She developed a seroma in the axilla and lateral breast and had 160 c of fluid drained on 12/08/2023.  5 days later they drained and got 120 cc,She had another 10  cc drained on 12/22/2023. She is presently undergoing radiation.She has a history of endometrial cancer in 2022 and a history of a TIA  05/04/2024; pt is now post radiation, last seen in therapy on 02/16/24 and advised to see MD for rapid increase in swelling and fever. On 02/18/2024 had 60 cc aspirated from seroma. The seroma re-accumulated and was aspirated of 150 cc with US  guidance. In a week it had re-accumulated again and pt underwent I and D on 03/05/2024 and had a drain placed. Drain was removed on 04/06/2024. When MD saw pt pain had improved and wound was healed but she was feeling some firmness in the inferior breast that was going to be evaluated by US . She continues with complaints of cording in the axilla/upper arm.  PATIENT GOALS:  Reassess how my recovery is going related to arm function, pain, and swelling.  PAIN:  Are you having pain? No    PRECAUTIONS: Recent Surgery, right UE Lymphedema risk, Prior TIA  RED FLAGS: None   ACTIVITY LEVEL / LEISURE: walking   OBJECTIVE:   PATIENT SURVEYS:  QUICK DASH: EVAL 31.82,  05/04/2024; 31.82 BREAST COMPLAINTS QUESTIONNAIRE (9/30) / 2nd column - 06/07/24 Pain:  7  2 Heaviness:  7  2 Swollen feeling: 7  4 Tense Skin:  7  4 Redness:  7  3 Bra Print:  0  1 Size of Pores:  3  1 Hard feeling:  7  6 Total:      45/80  23/80 A Score over 9 indicates lymphedema issues in the breast   OBSERVATIONS: EVAL: Large area of swelling with induration right upper breast.(05/04/2024)  Large indention noted at lateral breast (see photo in media) from where drain was in after I and D. Fibrosis noted inferior medial right breast with tenderness at lower breast above fibrosis  POSTURE:  Forward head and rounded shoulders posture    LYMPHEDEMA ASSESSMENT:   UPPER EXTREMITY AROM/PROM:   A/PROM RIGHT   eval   RIGHT 01/13/2024 RIGHT 05/04/2024  Shoulder extension 57 45 42  Shoulder flexion 160 140 125 with pulling  Shoulder abduction 169 115 tight  under arm 90 with pulling  Shoulder internal rotation 67 45   Shoulder external rotation 82 60                           (Blank rows = not tested)   A/PROM LEFT   eval  Shoulder extension 48  Shoulder flexion 137  Shoulder abduction 168  Shoulder internal rotation 57  Shoulder external rotation 75                          (Blank rows = not tested)   CERVICAL AROM: All within normal limits   UPPER EXTREMITY STRENGTH: WNL   LYMPHEDEMA ASSESSMENTS (in cm):    LANDMARK RIGHT   eval RIGHT 01/13/2024  10 cm proximal to olecranon process 32.9 33.8  Olecranon process 25 24.7  10 cm proximal to ulnar styloid process 24.5 23.0  Just proximal to ulnar styloid process 16.9 17.4  Across hand at thumb web space 19.5 19.6  At base of 2nd digit 6.6 6.3  (Blank rows = not tested)   LANDMARK LEFT   eval LEFT 01/13/2024  10 cm proximal to olecranon process 35 34.6  Olecranon process 25.3 25.5  10 cm proximal to ulnar styloid process 23 22.7  Just proximal to ulnar styloid process 16.4 16.5  Across hand at thumb web space 18.1 18.7  At base of 2nd digit 6.5 6.4  (Blank rows = not tested)    Surgery type/Date: right breast lumpectomy and sentinel lymph node biopsy on 11/27/2023.  Number of lymph nodes removed: 0+/2 Current/past treatment (chemo, radiation, hormone therapy): radiation Other symptoms:  Heaviness/tightness No Pain Yes Pitting edema No Infections No, 05/04/2024 Yes; left Breast treated for initially, then antibiotics held when aspirations occurred Decreased scar mobility Yes Stemmer sign No   TODAY'S TREATMENT: 06/09/2024  Therapeutic Exercises Roll yellow ball up wall into Rt UE abd x 7, pt reports mild stretch now felt so stopped, also very mild stretch felt with flex Supine scapular series, red band x 10, Flex, ER, horizontal abd, sword Held Free Motion due to pt doing same exs at gym yesterday Manual therapy MFR for Cording release to right axilla to  incision PROM Right shoulder flexion, abduction, and D2 with scapular depression throughout all manual therapy   06/07/24: Therapeutic Exercises Roll yellow ball up wall into Rt UE abd x 7, pt reports mild stretch now felt so stopped, also very mild stretch felt with flex Therapeutic Activities Free Motion Machine: Scapular Retraction 7#, x 20, then bil UE 3#, 2 x 10 Supine Scapular Series with red theraband returning therapist demo: Horz abd, bil er x 10 each, then narrow and wide grip flexion x 5 each and bil  D2 x 5 each, handout issued Manual therapy MFR for Cording release to right axilla to incision PROM Right shoulder flexion, abduction, and D2 with scapular depression throughout all manual therapy  06/01/24: Therapeutic Exercises Pulleys briefly into flex x 1 min and then same into abd instructing pt to focus on relaxing shoulder to try to counteract achiness Roll large green ball on mat table into flex but only min stretch felt so stopped, Rt UE abd x 5 each Lt S/L for Rt open book stretch x 3 reps with 10 sec holds returning therapist demo and VC's to focus on rotating at T-Spine Therapeutic Activities Supine over full foam roll: Rt UE scaption in a V x 15, and Rt UE abd in a snow angel x 15, 5 sec holds Free Motion Machine: Scapular Retraction 7#, x 20 Manual therapy MFR for Cording release to right axilla/breast and in pect tendon in supine STM to right intercostals and pect insertion; assessed lateral trunk but no tightness or tenderness here today Lt S/L for Rt scap mobs into protraction/retraction and just trying to lift away from the ribs as her scap mobility is very limited due to tightness PROM Right shoulder flexion, abduction, and D2 with scapular depression throughout all manual therapy; contract/relax with Rt hand behind head and pressure to medial elbow resisting adduction x 3 reps, then prolonged holds after each rep and followed with increased MFR to taut pect  tendon  05/18/24: Therapeutic Exercises Pulleys into flex and abd but no stretch felt so stopped Roll yellow ball up wall into flex and Rt abd x 10 each Therapeutic Activities Free Motion Machine: Scapular Retraction 7#, 2 x 10 returning therapist demo Supine over half foam roll: Rt UE scaption in a V x 10, and Rt UE abd in a snow angel x 10, 5 sec holds Manual therapy MFR for Cording release to right axilla/breast in supine STM to right UT, pectorals and lateral trunk with cocoa butter Dynamic cupping to right upper arm area of cording with 2nd smallest cup using cocoa butter for glide PROM Right shoulder flexion, abduction, and D2 with scapular depression throughout all manual thearpy  05/11/24: Therapeutic Exercises Pulleys into flex x 2 mins, increased Lt shoulder pain with pulling Rt UE so stopped Roll yellow ball up wall into flex and Rt abd x 10 each Therapeutic Activities Supine over half foam roll: Rt UE horz abd but no stretch felt so stopped, then Rt UE scaption x 10, and Rt UE abd in a snow angel x 10, 5 sec holds (did not do Lt UE due to increased pain from chronic issues), pt reports great stretches with these Manual therapy MFR for Cording release to right axilla/breast in supine STM to right UT, pectorals and lateral trunk with cocoa butter Dynamic cupping to right upper arm area of cording with 2nd largest clear cup using cocoa butter for glide PROM Right shoulder flexion, abduction, and D2 with scapular depression throughout all manual therapy      PATIENT EDUCATION:  Education details: Supine scapular series with red theraband Person educated: Patient Education method: Programmer, Multimedia, Demonstration, Verbal cues, and Handouts Education comprehension: verbalized understanding, returned demonstration, and verbal cues required  HOME EXERCISE PROGRAM: Instructed 4 post op exercises with pt holding Right wrist with left hand for AA flexion in supine, supine  stargazer, scapular retraction, wall slides. Showed wrist extension with elbow ext and gentle shoulder ext to stretch cords. Advised to do with appropriate tightness only and not to  force. Should not have pain in shoulder. To perform 2x's per day. Also educated in scar massage to incision area, and advised to watch ABC video.   ASSESSMENT:  CLINICAL IMPRESSION: Pt is doing very well with ROM appearing WNL now. There is still cording in axilla but it is not affecting ROM. She is excited to be doing more strengthening, and plans to join a gym in the new year. Reviewed supine scapular series with pt requiring occasional VC's but perfected with practice.   Pt will benefit from skilled therapeutic intervention to improve on the following deficits: Decreased knowledge of precautions, impaired UE functional use, pain, decreased ROM, postural dysfunction.   PT treatment/interventions: ADL/Self care home management, [phone]- PT Re-evaluation, [phone]- Physical Performance Testing, 97110-Therapeutic exercises, 97530- Therapeutic activity, [id number]- Neuromuscular re-education, 97535- Self Care, [id number]- Manual therapy, 97760- Orthotic Initial, [phone]- Orthotic/Prosthetic subsequent, and Patient/Family education   GOALS: Goals reviewed with patient? Yes  LONG TERM GOALS:  (STG=LTG)  GOALS Name Target Date  Goal status  1 Pt will demonstrate she has regained full shoulder ROM and function post operatively compared to baselines.  Baseline: 02/24/2024 INITIAL  2 Pt will be independent in Right shoulder ROM and strengthening 02/24/2024 06/15/2024 MET 06/09/2024  3 Pt will be independent in right breast MLD to decrease breast swelling 02/24/2024 06/15/2024 INITIAL  4 Pt will be compliant with compression bra to decrease right breast swelling 02/24/2024 06/15/2024 INITIAL  5. Quick dash will be no greater than 15% to demonstrate improved UE function  06/15/2024 NEW     PLAN:  PT FREQUENCY/DURATION: 2x/week x 6  weeks  PLAN FOR NEXT SESSION:Check goals, review breast MLD Cont ball roll up wall if pt wants (stop pulleys due to no stretch felt and pt request) review supine scapular series and progress AA/A/ROM stretches  Cont and review MLD and cont MFR to cords; pt will be reday for D/C per POC next week.    [facility]  [address]  [place] [address]  [phone]   Grayce JINNY Sheldon, PT 06/09/2024, 8:47 AM

## 2024-06-15 ENCOUNTER — Ambulatory Visit: Payer: Self-pay

## 2024-06-15 DIAGNOSIS — C50411 Malignant neoplasm of upper-outer quadrant of right female breast: Secondary | ICD-10-CM | POA: Diagnosis not present

## 2024-06-15 DIAGNOSIS — I890 Lymphedema, not elsewhere classified: Secondary | ICD-10-CM

## 2024-06-15 DIAGNOSIS — R293 Abnormal posture: Secondary | ICD-10-CM

## 2024-06-15 DIAGNOSIS — N63 Unspecified lump in unspecified breast: Secondary | ICD-10-CM

## 2024-06-15 DIAGNOSIS — G8929 Other chronic pain: Secondary | ICD-10-CM

## 2024-06-15 NOTE — Patient Instructions (Addendum)
 SABRA

## 2024-06-15 NOTE — Therapy (Addendum)
 OUTPATIENT PHYSICAL THERAPY BREAST CANCER TREATMENT   Patient Name: [name] MRN: [id number] DOB:[birth year], 68 y.o., female Today's Date: 06/15/2024  END OF SESSION:  PT End of Session - 06/15/24 0806     Visit Number 16    Number of Visits 21    Date for Recertification  06/15/24    PT Start Time 0801    PT Stop Time 0858    PT Time Calculation (min) 57 min    Activity Tolerance Patient tolerated treatment well    Behavior During Therapy [facility] for tasks assessed/performed           Past Medical History:  Diagnosis Date   Arthritis    neck, knees, ankles, feet, back   Asthma, mild intermittent    prn inhaler   Breast cancer (HCC)    Cervical spondylosis    Chronic dyspnea    per pt gets winded w/ stairs, but recovers quickly   Diverticular disease of colon    followed by dr kristie (GI)---  chronic w/ chronic diarrhea   Essential hypertension    Fatty infiltration of liver    followed by dr kristie--- abd ultrasound in epic 08-31-2020   Frequency of urination    History of chest pain    evaluated by cardiologist-- dr pietro,  11/ 2017 nuclear stress test suggest ischemia,  06/2016 cardiac cath , showed normal coronary anatomy and lvf   History of COVID-19    per pt in 2019   treated with presumed covid had  mild to moderate symptoms that resolved   History of diverticulitis of colon    05/ 2022   History of radiation therapy    Right breast-12/30/23-01/27/24-Dr. Lynwood Nasuti   History of TIA (transient ischemic attack) 06/2003   Hyperlipidemia    Hypertension    Limitation of joint motion of neck    per pt due to bone spurs C2-5   Mild intermittent asthma    followed by pcp   PMB (postmenopausal bleeding)    Pre-diabetes    Pulmonary nodule    followed by pcp,  last hest CT in epic 04-21-2018   Stenosis of cervix    SUI (stress urinary incontinence, female)    Thickened endometrium    TMJ syndrome    Wears glasses    Past Surgical History:   Procedure Laterality Date   APPENDECTOMY  1979   BREAST ENHANCEMENT SURGERY Bilateral 1988   per pt implants removed 2002   BUNIONECTOMY Left 07/17/2007   @[facility]    BUNIONECTOMY Right 05/26/2014   Procedure: RIGHT FOOT LAPIDUS BUNION CORRECTION AMD MODIFIED MCBRIDE BUNIONECTOMY;  Surgeon: Norleen Armor, MD;  Location:  SURGERY CENTER;  Service: Orthopedics;  Laterality: Right;   CARDIAC CATHETERIZATION N/A 06/28/2016   Procedure: Left Heart Cath and Coronary Angiography;  Surgeon: Peter M Jordan, MD;  Location: [facility] INVASIVE CV LAB;  Service: Cardiovascular;  Laterality: N/A;   CESAREAN SECTION  1986   COLONOSCOPY  12/27/2020   by dr kristie   FINGER SURGERY Left 07/28/2015   @[facility]  :  left thumb knuckle collasped  arthroplasty   FOOT HARDWARE REMOVAL  04/25/2016   @[facility] ;   right foot   FOOT SURGERY Right 04/04/2015   @[facility] ;   first and second toe's   GANGLION CYST EXCISION Right 2016   wrist   HYSTEROSCOPY WITH D & C N/A 05/24/2021   Procedure: DILATATION AND CURETTAGE /HYSTEROSCOPY and MyoSure;  Surgeon: Rendell Calton LABOR, DO;  Location:  SURGERY  CENTER;  Service: Gynecology;  Laterality: N/A;   INCISION AND DRAINAGE, ABSCESS, BREAST Right 03/05/2024   Procedure: INCISION AND DRAINAGE, ABSCESS, BREAST;  Surgeon: Belinda Cough, MD;  Location: [facility];  Service: General;  Laterality: Right;   IRRIGATION AND DEBRIDEMENT HEMATOMA Right 03/05/2024   Procedure: IRRIGATION AND DEBRIDEMENT HEMATOMA;  Surgeon: Belinda Cough, MD;  Location: [facility];  Service: General;  Laterality: Right;  WOUND EXPLORATION RIGHT CHEST WALL   NASAL SEPTUM SURGERY  1981   RADIOACTIVE SEED GUIDED AXILLARY SENTINEL LYMPH NODE Right 11/27/2023   Procedure: RIGHT BREAST LUMPECTOMY AFTER MAGNETIC SEED LOCALIZATION AND SENTINEL LYMPH NODE BIOPSY;  Surgeon: Belinda Cough, MD;  Location: [facility];  Service: General;  Laterality: Right;  GEN w/PEC  BLOCK RIGHT BREAST SEED LOCALIZED LUMPECTOMY WITH AXILLARY SENTINEL LYMPH NODE BIOPSY   ROTATOR CUFF REPAIR W/ DISTAL CLAVICLE EXCISION Left 08/18/2008   @[facility]  by dr amy   SHOULDER ARTHROSCOPY W/ LABRAL REPAIR Left 06/07/2008   @[facility] ;  and SAD  by dr amy   TONSILLECTOMY  1964   TUBAL LIGATION Bilateral 1999   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   Spondylolisthesis of lumbar region 04/29/2017   Spondylosis without myelopathy or radiculopathy, cervical region 04/29/2017   Pain from implanted hardware 04/04/2016   Obesity 03/19/2016   Primary osteoarthritis of first carpometacarpal joint of left hand 07/13/2015   History of TIA (transient ischemic attack) 03/14/2015   Metatarsalgia of right foot 03/08/2015   Routine general medical examination at a health care facility 11/23/2012   Essential hypertension 06/16/2008   Hyperlipidemia 12/29/2007    PCP:   REFERRING PROVIDER: Lauraine Golden, MD  REFERRING DIAG: s/p Right Breast Cancer  THERAPY DIAG:  Malignant neoplasm of upper-outer quadrant of right breast in female, estrogen receptor positive (HCC)  Abnormal posture  Chronic right shoulder pain  Swelling of breast  Lymphedema, not elsewhere classified  Rationale for Evaluation and Treatment: Rehabilitation  ONSET DATE: 01/06/2024 SUBJECTIVE:  SUBJECTIVE STATEMENT  I just got back from [place] yesterday. I flew there and drove back. I made sure to wear my compression bra when I flew but despite that my Rt breast feels swollen and tender toda, especially at the bottom of my breast and the cording feels tighter this morning too. I'd like to come a for a few more visits until this calms down so I don't have another flare up.   05/04/2024 Re-EVAL I had the I and D and it was some better, but now I am being bothered by pain at the 5:00 position of my breast, not along the crease like the MD says. The MD said I don't need to wear my compression bra anymore. Pain in the axilla and breast is a 7/10. I can't raise my arm because the cording pulls down into the breast.  I am not having the swelling in the upper breast anymore, but now at 5:00 position I have swelling and firmness. It is tender and sore. I will get an US  on Oct 9.  02/16/2024 I started having severe left breast swelling and pain on Saturday. My breast looks like a [place] and it had been doing so much better. I had been lifting my 23 year old grandson. It hurts down my right UE into my wrist. I slept 29 hours yesterday, and I had a low grade fever yesterday and chills. I have been nauseated as well, and threw up one time. I called the nurse line and they suggested I go to the ED, but I didn't want to do that.   PERTINENT HISTORY:  Patient was diagnosed on 10/24/2023 with right grade 2 invasive ductal carcinoma breast cancer. It measures 9 mm and is located in the upper outer quadrant. It is ER/PR positive and HER2 negative with a Ki67 of 20%. She had a Right lumpectomy with SLNB on 11/27/2023 with 0+/2 LN's. She developed a seroma in the axilla and lateral breast and had 160 c of fluid drained on 12/08/2023.  5 days later they drained and got 120 cc,She had another 10 cc drained on  12/22/2023. She is presently undergoing radiation.She has a history of endometrial cancer in 2022 and a history of a TIA  05/04/2024; pt is now post radiation, last seen in therapy on 02/16/24 and advised to see MD for rapid increase in swelling and fever. On 02/18/2024 had 60 cc aspirated from seroma. The seroma re-accumulated and was aspirated of 150 cc with US  guidance. In a week it had re-accumulated again and pt underwent I and D on 03/05/2024 and had a drain placed. Drain was removed on 04/06/2024. When MD saw pt pain had improved and wound was healed but she was feeling some firmness in the inferior breast that was going to be evaluated by US . She continues with complaints of cording in the axilla/upper arm.  PATIENT GOALS:  Reassess how my recovery is going related to arm function, pain, and swelling.  PAIN:  Are you having pain? No, not pain but feels very tender to touch at the bottom of the breast and cord is pulling more than it was before    PRECAUTIONS: Recent Surgery, right UE Lymphedema risk, Prior TIA  RED FLAGS: None   ACTIVITY LEVEL / LEISURE: walking   OBJECTIVE:   PATIENT SURVEYS:  QUICK DASH: EVAL 31.82,  05/04/2024; 31.82 BREAST COMPLAINTS QUESTIONNAIRE (9/30) / 2nd column - 06/07/24 Pain:  7  2 Heaviness:  7  2 Swollen feeling: 7  4 Tense Skin:  7  4 Redness:  7  3 Bra Print:  0  1 Size of Pores:  3  1 Hard feeling:  7  6 Total:      45/80  23/80 A Score over 9 indicates lymphedema issues in the breast   OBSERVATIONS: EVAL: Large area of swelling with induration right upper breast.(05/04/2024)  Large indention noted at lateral breast (see photo in media) from where drain was in after I and D. Fibrosis noted inferior medial right breast with tenderness at lower breast above fibrosis  POSTURE:  Forward head and rounded shoulders posture    LYMPHEDEMA ASSESSMENT:   UPPER EXTREMITY AROM/PROM:   A/PROM RIGHT   eval   RIGHT 01/13/2024 RIGHT 05/04/2024   Shoulder extension 57 45 42  Shoulder flexion 160 140 125 with pulling  Shoulder abduction 169 115 tight under arm 90 with pulling  Shoulder internal rotation 67 45   Shoulder external rotation 82 60                           (Blank rows = not tested)   A/PROM LEFT   eval  Shoulder extension 48  Shoulder flexion 137  Shoulder abduction 168  Shoulder internal rotation 57  Shoulder external rotation 75                          (Blank rows = not tested)   CERVICAL AROM: All within normal limits   UPPER EXTREMITY STRENGTH: WNL   LYMPHEDEMA ASSESSMENTS (in cm):    LANDMARK RIGHT   eval RIGHT 01/13/2024  10 cm proximal to olecranon process 32.9 33.8  Olecranon process 25 24.7  10 cm proximal to ulnar styloid process 24.5 23.0  Just proximal to ulnar styloid process 16.9 17.4  Across hand at thumb web space 19.5 19.6  At base of 2nd digit 6.6 6.3  (Blank rows = not tested)   LANDMARK LEFT   eval LEFT 01/13/2024  10 cm proximal to olecranon process 35 34.6  Olecranon process 25.3 25.5  10 cm proximal to ulnar styloid process 23 22.7  Just proximal to ulnar styloid process 16.4 16.5  Across hand at thumb web space 18.1 18.7  At base of 2nd digit 6.5 6.4  (Blank rows = not tested)    Surgery type/Date: right breast lumpectomy and sentinel lymph node biopsy on 11/27/2023.  Number of lymph nodes removed: 0+/2 Current/past treatment (chemo, radiation, hormone therapy): radiation Other symptoms:  Heaviness/tightness No Pain Yes Pitting edema No Infections No, 05/04/2024 Yes; left Breast treated for initially, then antibiotics held when aspirations occurred Decreased scar mobility Yes Stemmer sign No   TODAY'S TREATMENT: 06/15/24: Therapeutic Exercises Rolled yellow ball up wall into flex x 7 and abd x 12 with good stretch reported felt in cord in axilla, pt with slow and controlled motions for increased stretch Therapeutic Activities Free Motion Machine for postural  strength with 7# 2 x 15 with rest break between sets Supine over half foam roll for following: Bil UE abd in a snow angel x 12 with 3-5 sec holds, then bil UE scaption in a V x 12 with long holds as well for increased stretch Manual Therapy Assessed Rt breast for any signs/symptoms of infection and also had Saddie Raw, PT assess pt as well with pts past medical history of seroma and cellulitis. She does not appear to have symptoms indicating  a recurrence of either so continued with MLD of Rt breast. MLD to Rt breast as follows: Short neck, superficial and deep abdominals and spent time reviewing correct diaphragmatic breathing technique, Lt axilla and Rt inguinal nodes, anterior inter-axillary Rt axillo-inguinal anastomosis then focused on Rt breast redirecting towards anastomosis. Issued handout through email as pt reports she lost her first one.  MFR for Cording release to right axilla to incision PROM Right shoulder flexion, abduction, and D2 with scapular depression throughout all manual therapy  06/09/2024  Therapeutic Exercises Roll yellow ball up wall into Rt UE abd x 7, pt reports mild stretch now felt so stopped, also very mild stretch felt with flex Supine scapular series, red band x 10, Flex, ER, horizontal abd, sword Held Free Motion due to pt doing same exs at gym yesterday Manual therapy MFR for Cording release to right axilla to incision PROM Right shoulder flexion, abduction, and D2 with scapular depression throughout all manual therapy   06/07/24: Therapeutic Exercises Roll yellow ball up wall into Rt UE abd x 7, pt reports mild stretch now felt so stopped, also very mild stretch felt with flex Therapeutic Activities Free Motion Machine: Scapular Retraction 7#, x 20, then bil UE 3#, 2 x 10 Supine Scapular Series with red theraband returning therapist demo: Horz abd, bil er x 10 each, then narrow and wide grip flexion x 5 each and bil D2 x 5 each, handout issued Manual  therapy MFR for Cording release to right axilla to incision PROM Right shoulder flexion, abduction, and D2 with scapular depression throughout all manual therapy       PATIENT EDUCATION:  Education details: Supine scapular series with red theraband Person educated: Patient Education method: Programmer, Multimedia, Demonstration, Verbal cues, and Handouts Education comprehension: verbalized understanding, returned demonstration, and verbal cues required  HOME EXERCISE PROGRAM: Instructed 4 post op exercises with pt holding Right wrist with left hand for AA flexion in supine, supine stargazer, scapular retraction, wall slides. Showed wrist extension with elbow ext and gentle shoulder ext to stretch cords. Advised to do with appropriate tightness only and not to force. Should not have pain in shoulder. To perform 2x's per day. Also educated in scar massage to incision area, and advised to watch ABC video.   ASSESSMENT:  CLINICAL IMPRESSION: Was panning to D/C today but pts Rt breast flared up since her flight last week with increased Rt breast lymphedema and her Rt axilla cording is tighter. Pt will benefit from continued physical therapy x 2 more weeks to assist with decreasing flare up of symptoms.    Pt will benefit from skilled therapeutic intervention to improve on the following deficits: Decreased knowledge of precautions, impaired UE functional use, pain, decreased ROM, postural dysfunction.   PT treatment/interventions: ADL/Self care home management, [phone]- PT Re-evaluation, [phone]- Physical Performance Testing, 97110-Therapeutic exercises, 97530- Therapeutic activity, [id number]- Neuromuscular re-education, 97535- Self Care, [id number]- Manual therapy, 97760- Orthotic Initial, [phone]- Orthotic/Prosthetic subsequent, and Patient/Family education   GOALS: Goals reviewed with patient? Yes  LONG TERM GOALS:  (STG=LTG)  GOALS Name Target Date  Goal status  1 Pt will demonstrate she has regained full  shoulder ROM and function post operatively compared to baselines.  Baseline: 02/24/2024 INITIAL  2 Pt will be independent in Right shoulder ROM and strengthening 02/24/2024 06/15/2024 MET 06/09/2024  3 Pt will be independent in right breast MLD to decrease breast swelling 02/24/2024 06/15/2024 INITIAL  4 Pt will be compliant with compression bra to decrease right breast swelling  02/24/2024 06/15/2024 INITIAL  5. Quick dash will be no greater than 15% to demonstrate improved UE function  06/15/2024 NEW     PLAN:  PT FREQUENCY/DURATION: 2x/week x 6 weeks  PLAN FOR NEXT SESSION:Check goals and renew next x 2 weeks, review breast MLD Cont ball roll up wall if pt wants (stop pulleys due to no stretch felt and pt request) review supine scapular series and progress AA/A/ROM stretches  Cont and review MLD and cont MFR to cords; pt will be reday for D/C per POC next week.    [facility]  [address], [address]  [place] [address]  [phone]   Aden Berwyn Caldron, PTA 06/15/2024, 12:19 PM   Cancer Rehab [phone] Self manual lymph drainage: Perform this sequence once a day.  Only give enough pressure no your skin to make the skin move.  Hug yourself.  Do circles at your neck just above your collarbones.  Repeat this 10 times.  Diaphragmatic - Supine   Inhale through nose making navel move out toward hands. Exhale through puckered lips, hands follow navel in. Repeat _5__ times. Rest _10__ seconds between repeats.    Axilla - One at a Time   Using full weight of flat hand and fingers at center of uninvolved armpit, make _10__ in-place circles.   Copyright  VHI. All rights reserved.  LEG: Inguinal Nodes Stimulation   With small finger side of hand against hip crease on involved side, gently perform circles at the crease. Repeat __10_ times.   Copyright  VHI. All rights reserved.  Axilla to Inguinal Nodes - Sweep   On involved side, pump _4__  times from armpit along side of trunk to hip crease.  Now gently stretch skin from the involved side to the uninvolved side across the chest at the shoulder line.  Repeat that 4 times.  Draw an imaginary diagonal line from upper outer breast through the nipple area toward lower inner breast.  Direct fluid upward and inward from this line toward the pathway across your upper chest .  Do this in three rows to treat all of the upper inner breast tissue, and do each row 3-4x.      Direct fluid to treat all of lower outer breast tissue downward and outward toward  pathway that is aimed at the left groin. Easiest to reach side of breast in Lt S/L  Finish by doing the pathways as described above going from your involved armpit to the same side groin and going across your upper chest from the involved shoulder to the uninvolved shoulder.  Repeat the steps above where you do circles in your right groin and left armpit.

## 2024-06-17 ENCOUNTER — Ambulatory Visit

## 2024-06-22 ENCOUNTER — Encounter: Payer: Self-pay | Admitting: Rehabilitation

## 2024-06-22 ENCOUNTER — Ambulatory Visit: Admitting: Rehabilitation

## 2024-06-22 DIAGNOSIS — G8929 Other chronic pain: Secondary | ICD-10-CM

## 2024-06-22 DIAGNOSIS — N63 Unspecified lump in unspecified breast: Secondary | ICD-10-CM

## 2024-06-22 DIAGNOSIS — I890 Lymphedema, not elsewhere classified: Secondary | ICD-10-CM

## 2024-06-22 DIAGNOSIS — C50411 Malignant neoplasm of upper-outer quadrant of right female breast: Secondary | ICD-10-CM | POA: Diagnosis not present

## 2024-06-22 DIAGNOSIS — R293 Abnormal posture: Secondary | ICD-10-CM

## 2024-06-22 DIAGNOSIS — Z170 Estrogen receptor positive status [ER+]: Secondary | ICD-10-CM

## 2024-06-22 NOTE — Therapy (Signed)
 OUTPATIENT PHYSICAL THERAPY BREAST CANCER TREATMENT   Patient Name: [name] MRN: [id number] DOB:[birth year], 68 y.o., female Today's Date: 06/22/2024  END OF SESSION:  PT End of Session - 06/22/24 0851     Visit Number 17    Number of Visits 21    Date for Recertification  06/15/24    PT Start Time 0803    PT Stop Time 0850   has to leave for a work call   PT Time Calculation (min) 47 min    Activity Tolerance Patient tolerated treatment well    Behavior During Therapy [facility] for tasks assessed/performed            Past Medical History:  Diagnosis Date   Arthritis    neck, knees, ankles, feet, back   Asthma, mild intermittent    prn inhaler   Breast cancer (HCC)    Cervical spondylosis    Chronic dyspnea    per pt gets winded w/ stairs, but recovers quickly   Diverticular disease of colon    followed by dr kristie (GI)---  chronic w/ chronic diarrhea   Essential hypertension    Fatty infiltration of liver    followed by dr kristie--- abd ultrasound in epic 08-31-2020   Frequency of urination    History of chest pain    evaluated by cardiologist-- dr pietro,  11/ 2017 nuclear stress test suggest ischemia,  06/2016 cardiac cath , showed normal coronary anatomy and lvf   History of COVID-19    per pt in 2019   treated with presumed covid had  mild to moderate symptoms that resolved   History of diverticulitis of colon    05/ 2022   History of radiation therapy    Right breast-12/30/23-01/27/24-Dr. Lynwood Nasuti   History of TIA (transient ischemic attack) 06/2003   Hyperlipidemia    Hypertension    Limitation of joint motion of neck    per pt due to bone spurs C2-5   Mild intermittent asthma    followed by pcp   PMB (postmenopausal bleeding)    Pre-diabetes    Pulmonary nodule    followed by pcp,  last hest CT in epic 04-21-2018   Stenosis of cervix    SUI (stress urinary incontinence, female)    Thickened endometrium    TMJ syndrome    Wears glasses     Past Surgical History:  Procedure Laterality Date   APPENDECTOMY  1979   BREAST ENHANCEMENT SURGERY Bilateral 1988   per pt implants removed 2002   BUNIONECTOMY Left 07/17/2007   @[facility]    BUNIONECTOMY Right 05/26/2014   Procedure: RIGHT FOOT LAPIDUS BUNION CORRECTION AMD MODIFIED MCBRIDE BUNIONECTOMY;  Surgeon: Norleen Armor, MD;  Location: [facility];  Service: Orthopedics;  Laterality: Right;   CARDIAC CATHETERIZATION N/A 06/28/2016   Procedure: Left Heart Cath and Coronary Angiography;  Surgeon: Peter M Jordan, MD;  Location: [facility];  Service: Cardiovascular;  Laterality: N/A;   CESAREAN SECTION  1986   COLONOSCOPY  12/27/2020   by dr kristie   FINGER SURGERY Left 07/28/2015   @[facility]  :  left thumb knuckle collasped  arthroplasty   FOOT HARDWARE REMOVAL  04/25/2016   @[facility] ;   right foot   FOOT SURGERY Right 04/04/2015   @[facility] ;   first and second toe's   GANGLION CYST EXCISION Right 2016   wrist   HYSTEROSCOPY WITH D & C N/A 05/24/2021   Procedure: DILATATION AND CURETTAGE /HYSTEROSCOPY and MyoSure;  Surgeon:  Law, Cassandra A, DO;  Location: [facility];  Service: Gynecology;  Laterality: N/A;   INCISION AND DRAINAGE, ABSCESS, BREAST Right 03/05/2024   Procedure: INCISION AND DRAINAGE, ABSCESS, BREAST;  Surgeon: Belinda Cough, MD;  Location: [facility];  Service: General;  Laterality: Right;   IRRIGATION AND DEBRIDEMENT HEMATOMA Right 03/05/2024   Procedure: IRRIGATION AND DEBRIDEMENT HEMATOMA;  Surgeon: Belinda Cough, MD;  Location: [facility];  Service: General;  Laterality: Right;  WOUND EXPLORATION RIGHT CHEST WALL   NASAL SEPTUM SURGERY  1981   RADIOACTIVE SEED GUIDED AXILLARY SENTINEL LYMPH NODE Right 11/27/2023   Procedure: RIGHT BREAST LUMPECTOMY AFTER MAGNETIC SEED LOCALIZATION AND SENTINEL LYMPH NODE BIOPSY;  Surgeon: Belinda Cough, MD;  Location: [facility];  Service: General;  Laterality:  Right;  GEN w/PEC BLOCK RIGHT BREAST SEED LOCALIZED LUMPECTOMY WITH AXILLARY SENTINEL LYMPH NODE BIOPSY   ROTATOR CUFF REPAIR W/ DISTAL CLAVICLE EXCISION Left 08/18/2008   @[facility]  by dr amy   SHOULDER ARTHROSCOPY W/ LABRAL REPAIR Left 06/07/2008   @[facility] ;  and SAD  by dr amy   TONSILLECTOMY  1964   TUBAL LIGATION Bilateral 1999   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   Spondylolisthesis of lumbar region 04/29/2017   Spondylosis without myelopathy or radiculopathy, cervical region 04/29/2017   Pain from implanted hardware 04/04/2016   Obesity 03/19/2016   Primary osteoarthritis of first carpometacarpal joint of left hand 07/13/2015   History of TIA (transient ischemic attack) 03/14/2015   Metatarsalgia of right foot 03/08/2015   Routine general medical examination at a health care facility 11/23/2012   Essential hypertension 06/16/2008   Hyperlipidemia 12/29/2007    PCP:   REFERRING PROVIDER: Lauraine Golden, MD  REFERRING DIAG: s/p Right Breast Cancer  THERAPY DIAG:  Malignant neoplasm of upper-outer quadrant of right breast in female, estrogen receptor positive (HCC)  Abnormal posture  Chronic right shoulder pain  Swelling of breast  Lymphedema, not elsewhere classified  Rationale for Evaluation and Treatment: Rehabilitation  ONSET DATE: 01/06/2024 SUBJECTIVE:  SUBJECTIVE STATEMENT Its feeling okay as I come in.  It still feels more swollen in the breast   05/04/2024 Re-EVAL I had the I and D and it was some better, but now I am being bothered by pain at the 5:00 position of my breast, not along the crease like the MD says. The MD said I don't need to wear my compression bra anymore. Pain in the axilla and breast is a 7/10. I can't raise my arm because the cording pulls down into the breast.  I am not having the swelling in the upper breast anymore, but now at 5:00 position I have swelling and firmness. It is tender and sore. I will get an US  on Oct 9.  PERTINENT HISTORY:  Patient was diagnosed on 10/24/2023 with right grade 2 invasive ductal carcinoma breast cancer. It measures 9 mm and is located in the upper outer quadrant. It is ER/PR positive and HER2 negative with a Ki67 of 20%. She had a Right lumpectomy with SLNB on 11/27/2023 with 0+/2 LN's. She developed a seroma in the axilla and lateral breast and had 160 c of fluid drained on 12/08/2023.  5 days later they drained and got 120 cc,She had another 10 cc drained on 12/22/2023. She is presently undergoing radiation.She has a history of endometrial cancer in 2022 and a history of a TIA  05/04/2024; pt is now post radiation, last seen in therapy on 02/16/24 and advised to see MD for rapid increase in swelling and fever. On 02/18/2024 had 60 cc aspirated from seroma. The seroma re-accumulated and was aspirated of 150 cc with US  guidance. In a week it had re-accumulated again and pt underwent I and D on 03/05/2024 and had a drain placed. Drain was removed on 04/06/2024. When MD saw pt pain had improved and wound was healed but she was feeling some firmness in the inferior breast that was going to be evaluated by US . She continues with complaints of cording in the axilla/upper arm.  PATIENT  GOALS:  Reassess how my recovery is going related to arm function, pain, and swelling.  PAIN:  Are you having pain? No   PRECAUTIONS: Recent Surgery, right UE Lymphedema risk, Prior TIA  RED FLAGS: None   ACTIVITY LEVEL / LEISURE: walking   OBJECTIVE:   PATIENT SURVEYS:  QUICK DASH: EVAL 31.82,  05/04/2024; 31.82 BREAST COMPLAINTS QUESTIONNAIRE (9/30) / 2nd column - 06/07/24 Pain:  7  2 Heaviness:  7  2 Swollen feeling: 7  4 Tense Skin:  7  4 Redness:  7  3 Bra Print:  0  1 Size of Pores:  3  1 Hard feeling:  7  6 Total:      45/80  23/80 A Score over 9 indicates lymphedema issues in the breast   OBSERVATIONS: EVAL: Large area of swelling with induration right upper breast.(05/04/2024)  Large indention noted at lateral breast (see photo in media) from where drain was in after I and D. Fibrosis noted inferior medial right breast with tenderness at lower breast above fibrosis  POSTURE:  Forward head and rounded shoulders posture    LYMPHEDEMA ASSESSMENT:   UPPER EXTREMITY AROM/PROM:   A/PROM RIGHT   eval   RIGHT 01/13/2024 RIGHT 05/04/2024  Shoulder extension 57 45 42  Shoulder flexion 160 140 125 with pulling  Shoulder abduction 169 115 tight under arm 90 with pulling  Shoulder internal rotation 67 45   Shoulder external rotation 82 60                           (  Blank rows = not tested)   A/PROM LEFT   eval  Shoulder extension 48  Shoulder flexion 137  Shoulder abduction 168  Shoulder internal rotation 57  Shoulder external rotation 75                          (Blank rows = not tested)   CERVICAL AROM: All within normal limits   UPPER EXTREMITY STRENGTH: WNL   LYMPHEDEMA ASSESSMENTS (in cm):    LANDMARK RIGHT   eval RIGHT 01/13/2024  10 cm proximal to olecranon process 32.9 33.8  Olecranon process 25 24.7  10 cm proximal to ulnar styloid process 24.5 23.0  Just proximal to ulnar styloid process 16.9 17.4  Across hand at thumb web space 19.5 19.6   At base of 2nd digit 6.6 6.3  (Blank rows = not tested)   LANDMARK LEFT   eval LEFT 01/13/2024  10 cm proximal to olecranon process 35 34.6  Olecranon process 25.3 25.5  10 cm proximal to ulnar styloid process 23 22.7  Just proximal to ulnar styloid process 16.4 16.5  Across hand at thumb web space 18.1 18.7  At base of 2nd digit 6.5 6.4  (Blank rows = not tested)    Surgery type/Date: right breast lumpectomy and sentinel lymph node biopsy on 11/27/2023.  Number of lymph nodes removed: 0+/2 Current/past treatment (chemo, radiation, hormone therapy): radiation Other symptoms:  Heaviness/tightness No Pain Yes Pitting edema No Infections No, 05/04/2024 Yes; left Breast treated for initially, then antibiotics held when aspirations occurred Decreased scar mobility Yes Stemmer sign No   TODAY'S TREATMENT: 06/22/24: Therapeutic Exercises Rolled yellow ball up wall into flex x 10 and abd x 12 with good stretch reported Therapeutic Activities Free Motion Machine for scapular retraction and extension with 7# 2 x 15 with rest break between sets Supine over half foam roll for following: Bil UE abd in a snow angel x 12 with 3-5 sec holds, then bil UE scaption in a V x 12 with long holds as well for increased stretch - pt not including Left shoulder today due to shoulder pain.   Manual Therapy MLD to Rt breast as follows: Short neck, breathing only due to diverticulitis pain, Lt axilla and Rt inguinal nodes, anterior inter-axillary Rt axillo-inguinal anastomosis then focused on Rt breast redirecting towards anastomosis.  MFR for Cording release to right axilla to incision PROM Right shoulder flexion, abduction, and D2 with scapular depression throughout all manual therapy  06/15/24: Therapeutic Exercises Rolled yellow ball up wall into flex x 7 and abd x 12 with good stretch reported felt in cord in axilla, pt with slow and controlled motions for increased stretch Therapeutic  Activities Free Motion Machine for postural strength with 7# 2 x 15 with rest break between sets Supine over half foam roll for following: Bil UE abd in a snow angel x 12 with 3-5 sec holds, then bil UE scaption in a V x 12 with long holds as well for increased stretch Manual Therapy Assessed Rt breast for any signs/symptoms of infection and also had Saddie Raw, PT assess pt as well with pts past medical history of seroma and cellulitis. She does not appear to have symptoms indicating a recurrence of either so continued with MLD of Rt breast. MLD to Rt breast as follows: Short neck, superficial and deep abdominals and spent time reviewing correct diaphragmatic breathing technique, Lt axilla and Rt inguinal nodes, anterior inter-axillary Rt axillo-inguinal anastomosis  then focused on Rt breast redirecting towards anastomosis. Issued handout through email as pt reports she lost her first one.  MFR for Cording release to right axilla to incision PROM Right shoulder flexion, abduction, and D2 with scapular depression throughout all manual therapy  06/09/2024 Therapeutic Exercises Roll yellow ball up wall into Rt UE abd x 7, pt reports mild stretch now felt so stopped, also very mild stretch felt with flex Supine scapular series, red band x 10, Flex, ER, horizontal abd, sword Held Free Motion due to pt doing same exs at gym yesterday Manual therapy MFR for Cording release to right axilla to incision PROM Right shoulder flexion, abduction, and D2 with scapular depression throughout all manual therapy  PATIENT EDUCATION:  Education details: Supine scapular series with red theraband Person educated: Patient Education method: Programmer, Multimedia, Demonstration, Verbal cues, and Handouts Education comprehension: verbalized understanding, returned demonstration, and verbal cues required  HOME EXERCISE PROGRAM: Instructed 4 post op exercises with pt holding Right wrist with left hand for AA flexion in  supine, supine stargazer, scapular retraction, wall slides. Showed wrist extension with elbow ext and gentle shoulder ext to stretch cords. Advised to do with appropriate tightness only and not to force. Should not have pain in shoulder. To perform 2x's per day. Also educated in scar massage to incision area, and advised to watch ABC video.   ASSESSMENT:  CLINICAL IMPRESSION:  Pt reports she still feels a bit aggravated in the breast, a bit more swollen, so we continued MLD and MFR as well as mobility and strength.   Pt will benefit from skilled therapeutic intervention to improve on the following deficits: Decreased knowledge of precautions, impaired UE functional use, pain, decreased ROM, postural dysfunction.   PT treatment/interventions: ADL/Self care home management, [phone]- PT Re-evaluation, [phone]- Physical Performance Testing, 97110-Therapeutic exercises, 97530- Therapeutic activity, [id number]- Neuromuscular re-education, 97535- Self Care, [id number]- Manual therapy, 97760- Orthotic Initial, [phone]- Orthotic/Prosthetic subsequent, and Patient/Family education   GOALS: Goals reviewed with patient? Yes  LONG TERM GOALS:  (STG=LTG)  GOALS Name Target Date  Goal status  1 Pt will demonstrate she has regained full shoulder ROM and function post operatively compared to baselines.  Baseline: 02/24/2024 INITIAL  2 Pt will be independent in Right shoulder ROM and strengthening 02/24/2024 06/15/2024 MET 06/09/2024  3 Pt will be independent in right breast MLD to decrease breast swelling 02/24/2024 06/15/2024 INITIAL  4 Pt will be compliant with compression bra to decrease right breast swelling 02/24/2024 06/15/2024 INITIAL  5. Quick dash will be no greater than 15% to demonstrate improved UE function  06/15/2024 NEW     PLAN:  PT FREQUENCY/DURATION: 2x/week x 6 weeks  PLAN FOR NEXT SESSION:Check goals and renew next x 2 weeks, review breast MLD Cont ball roll up wall if pt wants (stop pulleys due  to no stretch felt and pt request) review supine scapular series and progress AA/A/ROM stretches  Cont and review MLD and cont MFR to cords; pt will be reday for D/C per POC next week.    [facility]  [address]  [place] [address]  [phone]   Larue Saddie SAUNDERS, PT 06/22/2024, 8:52 AM   [facility] [phone] Self manual lymph drainage: Perform this sequence once a day.  Only give enough pressure no your skin to make the skin move.  Hug yourself.  Do circles at your neck just above your collarbones.  Repeat this 10 times.  Diaphragmatic - Supine  Inhale through nose making navel move out toward hands. Exhale through puckered lips, hands follow navel in. Repeat _5__ times. Rest _10__ seconds between repeats.    Axilla - One at a Time   Using full weight of flat hand and fingers at center of uninvolved armpit, make _10__ in-place circles.   Copyright  VHI. All rights reserved.  LEG: Inguinal Nodes Stimulation   With small finger side of hand against hip crease on involved side, gently perform circles at the crease. Repeat __10_ times.   Copyright  VHI. All rights reserved.  Axilla to Inguinal Nodes - Sweep   On involved side, pump _4__ times from armpit along side of trunk to hip crease.  Now gently stretch skin from the involved side to the uninvolved side across the chest at the shoulder line.  Repeat that 4 times.  Draw an imaginary diagonal line from upper outer breast through the nipple area toward lower inner breast.  Direct fluid upward and inward from this line toward the pathway across your upper chest .  Do this in three rows to treat all of the upper inner breast tissue, and do each row 3-4x.      Direct fluid to treat all of lower outer breast tissue downward and outward toward  pathway that is aimed at the left groin. Easiest to reach side of breast in Lt S/L  Finish by doing the pathways as described above going from  your involved armpit to the same side groin and going across your upper chest from the involved shoulder to the uninvolved shoulder.  Repeat the steps above where you do circles in your right groin and left armpit.

## 2024-06-25 ENCOUNTER — Ambulatory Visit

## 2024-06-25 NOTE — Progress Notes (Signed)
 HPI: [name], hyperlipidemia. Cardiac catheterization 11/17 showed normal LV function and normal coronary anatomy. Patient seen with recurrent falls and question syncope December 2021. Monitor 1/22 showed sinus bradycardia, normal sinus rhythm and sinus tachycardia. Echocardiogram October 2025 showed normal LV function.  Monitor October 2025 showed sinus rhythm with rare PAC and PVC.  Seen September 2025 with episode of near syncope felt secondary to low blood pressure.  Since last seen, she denies dyspnea, chest pain, palpitations.  She continues to have difficulties with dizziness with changing positions.  She has lost 84 pounds.  Current Outpatient Medications  Medication Sig Dispense Refill   albuterol  (PROVENTIL ) (2.5 MG/3ML) 0.083% nebulizer solution Take 3 mLs (2.5 mg total) by nebulization every 6 (six) hours as needed for wheezing or shortness of breath. 150 mL 1   albuterol  (VENTOLIN  HFA) 108 (90 Base) MCG/ACT inhaler Inhale 1-2 puffs into the lungs every 4 (four) hours as needed for wheezing or shortness of breath. 18 g 0   cetirizine  (ZYRTEC ) 10 MG tablet Take 1 tablet (10 mg total) by mouth daily. 90 tablet 3   CIPRO  500 MG tablet Take 500 mg by mouth daily with breakfast.     citalopram  (CELEXA ) 10 MG tablet Take 1 tablet (10 mg total) by mouth daily. 90 tablet 3   letrozole  (FEMARA ) 2.5 MG tablet Take 1 tablet (2.5 mg total) by mouth daily. 30 tablet 0   linaclotide (LINZESS) 290 MCG CAPS capsule Take 290 mcg by mouth daily before breakfast.     metroNIDAZOLE  (FLAGYL ) 500 MG tablet Take 500 mg by mouth daily.     montelukast  (SINGULAIR ) 10 MG tablet TAKE 1 TABLET(10 MG) BY MOUTH AT BEDTIME 90 tablet 0   REPATHA  SURECLICK 140 MG/ML SOAJ ADMINISTER 1 ML UNDER THE SKIN EVERY 14 DAYS 2 mL 11   No current facility-administered medications for this visit.     Past Medical History:  Diagnosis Date   Arthritis    neck, knees, ankles, feet, back   Asthma, mild  intermittent    prn inhaler   Breast cancer (HCC)    Cervical spondylosis    Chronic dyspnea    per pt gets winded w/ stairs, but recovers quickly   Diverticular disease of colon    followed by dr kristie (GI)---  chronic w/ chronic diarrhea   Essential hypertension    Fatty infiltration of liver    followed by dr kristie--- abd ultrasound in epic 08-31-2020   Frequency of urination    History of chest pain    evaluated by cardiologist-- dr pietro,  11/ 2017 nuclear stress test suggest ischemia,  06/2016 cardiac cath , showed normal coronary anatomy and lvf   History of COVID-19    per pt in 2019   treated with presumed covid had  mild to moderate symptoms that resolved   History of diverticulitis of colon    05/ 2022   History of radiation therapy    Right breast-12/30/23-01/27/24-Dr. Lynwood Nasuti   History of TIA (transient ischemic attack) 06/2003   Hyperlipidemia    Hypertension    Limitation of joint motion of neck    per pt due to bone spurs C2-5   Mild intermittent asthma    followed by pcp   PMB (postmenopausal bleeding)    Pre-diabetes    Pulmonary nodule    followed by pcp,  last hest CT in epic 04-21-2018   Stenosis of cervix    SUI (stress urinary incontinence,  female)    Thickened endometrium    TMJ syndrome    Wears glasses     Past Surgical History:  Procedure Laterality Date   APPENDECTOMY  1979   BREAST ENHANCEMENT SURGERY Bilateral 1988   per pt implants removed 2002   BUNIONECTOMY Left 07/17/2007   @[facility]    BUNIONECTOMY Right 05/26/2014   Procedure: RIGHT FOOT LAPIDUS BUNION CORRECTION AMD MODIFIED MCBRIDE BUNIONECTOMY;  Surgeon: Norleen Armor, MD;  Location: [facility];  Service: Orthopedics;  Laterality: Right;   CARDIAC CATHETERIZATION N/A 06/28/2016   Procedure: Left Heart Cath and Coronary Angiography;  Surgeon: Peter M Jordan, MD;  Location: [facility] INVASIVE CV LAB;  Service: Cardiovascular;  Laterality: N/A;   CESAREAN SECTION  1986    COLONOSCOPY  12/27/2020   by dr kristie   FINGER SURGERY Left 07/28/2015   @[facility]  :  left thumb knuckle collasped  arthroplasty   FOOT HARDWARE REMOVAL  04/25/2016   @[facility] ;   right foot   FOOT SURGERY Right 04/04/2015   @[facility] ;   first and second toe's   GANGLION CYST EXCISION Right 2016   wrist   HYSTEROSCOPY WITH D & C N/A 05/24/2021   Procedure: DILATATION AND CURETTAGE /HYSTEROSCOPY and MyoSure;  Surgeon: Rendell Calton LABOR, DO;  Location: [facility];  Service: Gynecology;  Laterality: N/A;   INCISION AND DRAINAGE, ABSCESS, BREAST Right 03/05/2024   Procedure: INCISION AND DRAINAGE, ABSCESS, BREAST;  Surgeon: Belinda Cough, MD;  Location: [facility];  Service: General;  Laterality: Right;   IRRIGATION AND DEBRIDEMENT HEMATOMA Right 03/05/2024   Procedure: IRRIGATION AND DEBRIDEMENT HEMATOMA;  Surgeon: Belinda Cough, MD;  Location: [facility];  Service: General;  Laterality: Right;  WOUND EXPLORATION RIGHT CHEST WALL   NASAL SEPTUM SURGERY  1981   RADIOACTIVE SEED GUIDED AXILLARY SENTINEL LYMPH NODE Right 11/27/2023   Procedure: RIGHT BREAST LUMPECTOMY AFTER MAGNETIC SEED LOCALIZATION AND SENTINEL LYMPH NODE BIOPSY;  Surgeon: Belinda Cough, MD;  Location:  SURGERY CENTER;  Service: General;  Laterality: Right;  GEN w/PEC BLOCK RIGHT BREAST SEED LOCALIZED LUMPECTOMY WITH AXILLARY SENTINEL LYMPH NODE BIOPSY   ROTATOR CUFF REPAIR W/ DISTAL CLAVICLE EXCISION Left 08/18/2008   @[facility]  by dr amy   SHOULDER ARTHROSCOPY W/ LABRAL REPAIR Left 06/07/2008   @[facility] ;  and SAD  by dr amy   TONSILLECTOMY  1964   TUBAL LIGATION Bilateral 1999    Social History   Socioeconomic History   Marital status: Married    Spouse name: Not on file   Number of children: 2   Years of education: Not on file   Highest education level: Not on file  Occupational History   Not on file  Tobacco Use   Smoking status: Former    Current packs/day: 0.00     Types: Cigarettes    Start date: 09/13/1975    Quit date: 09/12/2005    Years since quitting: 18.8   Smokeless tobacco: Never  Vaping Use   Vaping status: Never Used  Substance and Sexual Activity   Alcohol use: Yes    Comment: occais   Drug use: No   Sexual activity: Not Currently    Birth control/protection: Surgical    Comment: hyst  Other Topics Concern   Not on file  Social History Narrative   Right hand   Lives with husband   One story home   Does work in airline pilot   Drinks caffeine   Social Drivers of Health  Financial Resource Strain: Not on file  Food Insecurity: No Food Insecurity (11/19/2023)   Hunger Vital Sign    Worried About Running Out of Food in the Last Year: Never true    Ran Out of Food in the Last Year: Never true  Transportation Needs: No Transportation Needs (11/19/2023)   PRAPARE - Administrator, Civil Service (Medical): No    Lack of Transportation (Non-Medical): No  Physical Activity: Not on file  Stress: Not on file  Social Connections: Not on file  Intimate Partner Violence: Not on file    Family History  Problem Relation Age of Onset   Lung cancer Mother 29   Diabetes Mother    Thalassemia Mother    Colon polyps Mother    Atrial fibrillation Father    Lung cancer Father 29   Colon cancer Father 27   Bone cancer Father 58   Prostate cancer Father        dx. ?, patient reports all cancers were separate primaries   Thalassemia Sister    Lymphoma Brother 63   Thalassemia Maternal Aunt    Heart disease Paternal Aunt    Colon cancer Paternal Aunt        dx. >50   Uterine cancer Paternal Aunt        dx. >50   Colon cancer Paternal Aunt        dx. >50   Uterine cancer Paternal Aunt        dx. >50   Colon cancer Paternal Grandmother 68 - 72   Colon cancer Cousin 44 - 10       maternal first cousin   Colon cancer Cousin 54 - 14       paternal first cousin   Rectal cancer Neg Hx    Stomach cancer Neg Hx     ROS: no  fevers or chills, productive cough, hemoptysis, dysphasia, odynophagia, melena, hematochezia, dysuria, hematuria, rash, seizure activity, orthopnea, PND, pedal edema, claudication. Remaining systems are negative.  Physical Exam: Well-developed well-nourished in no acute distress.  Skin is warm and dry.  HEENT is normal.  Neck is supple.  Chest is clear to auscultation with normal expansion.  Cardiovascular exam is regular rate and rhythm.  Abdominal exam nontender or distended. No masses palpated. Extremities show no edema. neuro grossly intact  A/P  1 hypertension-patient's blood pressure is controlled.  She is having orthostatic symptoms associated with near syncope.  We discussed maintaining hydration, increasing sodium intake and compression hose.  2 hyperlipidemia-continue Repatha .  Intolerant to statins.  3 dyspnea-this is felt secondary to combination of obesity, deconditioning and obesity hypoventilation syndrome.  Previously declined evaluation for sleep apnea.  4 obesity-she has lost 84 pounds and this is likely the cause of her orthostasis.  Redell Shallow, MD

## 2024-06-29 ENCOUNTER — Ambulatory Visit

## 2024-06-29 DIAGNOSIS — C50411 Malignant neoplasm of upper-outer quadrant of right female breast: Secondary | ICD-10-CM | POA: Diagnosis not present

## 2024-06-29 DIAGNOSIS — N63 Unspecified lump in unspecified breast: Secondary | ICD-10-CM

## 2024-06-29 DIAGNOSIS — I890 Lymphedema, not elsewhere classified: Secondary | ICD-10-CM

## 2024-06-29 DIAGNOSIS — R293 Abnormal posture: Secondary | ICD-10-CM

## 2024-06-29 DIAGNOSIS — G8929 Other chronic pain: Secondary | ICD-10-CM

## 2024-06-29 NOTE — Therapy (Signed)
 OUTPATIENT PHYSICAL THERAPY BREAST CANCER TREATMENT   Patient Name: [name] MRN: [id number] DOB:27-Jul-1956, 68 y.o., female Today's Date: 06/29/2024  END OF SESSION:  PT End of Session - 06/29/24 0800     Visit Number 18    Number of Visits 26    Date for Recertification  07/20/24    PT Start Time 0757    PT Stop Time 0857    PT Time Calculation (min) 60 min    Activity Tolerance Patient tolerated treatment well    Behavior During Therapy [facility] for tasks assessed/performed            Past Medical History:  Diagnosis Date   Arthritis    neck, knees, ankles, feet, back   Asthma, mild intermittent    prn inhaler   Breast cancer (HCC)    Cervical spondylosis    Chronic dyspnea    per pt gets winded w/ stairs, but recovers quickly   Diverticular disease of colon    followed by dr kristie (GI)---  chronic w/ chronic diarrhea   Essential hypertension    Fatty infiltration of liver    followed by dr kristie--- abd ultrasound in epic 08-31-2020   Frequency of urination    History of chest pain    evaluated by cardiologist-- dr pietro,  11/ 2017 nuclear stress test suggest ischemia,  06/2016 cardiac cath , showed normal coronary anatomy and lvf   History of COVID-19    per pt in 2019   treated with presumed covid had  mild to moderate symptoms that resolved   History of diverticulitis of colon    05/ 2022   History of radiation therapy    Right breast-12/30/23-01/27/24-Dr. Lynwood Nasuti   History of TIA (transient ischemic attack) 06/2003   Hyperlipidemia    Hypertension    Limitation of joint motion of neck    per pt due to bone spurs C2-5   Mild intermittent asthma    followed by pcp   PMB (postmenopausal bleeding)    Pre-diabetes    Pulmonary nodule    followed by pcp,  last hest CT in epic 04-21-2018   Stenosis of cervix    SUI (stress urinary incontinence, female)    Thickened endometrium    TMJ syndrome    Wears glasses    Past Surgical History:   Procedure Laterality Date   APPENDECTOMY  1979   BREAST ENHANCEMENT SURGERY Bilateral 1988   per pt implants removed 2002   BUNIONECTOMY Left 07/17/2007   @[facility]    BUNIONECTOMY Right 05/26/2014   Procedure: RIGHT FOOT LAPIDUS BUNION CORRECTION AMD MODIFIED MCBRIDE BUNIONECTOMY;  Surgeon: Norleen Armor, MD;  Location: [facility];  Service: Orthopedics;  Laterality: Right;   CARDIAC CATHETERIZATION N/A 06/28/2016   Procedure: Left Heart Cath and Coronary Angiography;  Surgeon: Peter M Jordan, MD;  Location: [facility] INVASIVE CV LAB;  Service: Cardiovascular;  Laterality: N/A;   CESAREAN SECTION  1986   COLONOSCOPY  12/27/2020   by dr kristie   FINGER SURGERY Left 07/28/2015   @[facility]  :  left thumb knuckle collasped  arthroplasty   FOOT HARDWARE REMOVAL  04/25/2016   @[facility] ;   right foot   FOOT SURGERY Right 04/04/2015   @[facility] ;   first and second toe's   GANGLION CYST EXCISION Right 2016   wrist   HYSTEROSCOPY WITH D & C N/A 05/24/2021   Procedure: DILATATION AND CURETTAGE /HYSTEROSCOPY and MyoSure;  Surgeon: Rendell Ruby A, DO;  Location: [facility];  Service: Gynecology;  Laterality: N/A;   INCISION AND DRAINAGE, ABSCESS, BREAST Right 03/05/2024   Procedure: INCISION AND DRAINAGE, ABSCESS, BREAST;  Surgeon: Belinda Cough, MD;  Location: [facility];  Service: General;  Laterality: Right;   IRRIGATION AND DEBRIDEMENT HEMATOMA Right 03/05/2024   Procedure: IRRIGATION AND DEBRIDEMENT HEMATOMA;  Surgeon: Belinda Cough, MD;  Location: [facility];  Service: General;  Laterality: Right;  WOUND EXPLORATION RIGHT CHEST WALL   NASAL SEPTUM SURGERY  1981   RADIOACTIVE SEED GUIDED AXILLARY SENTINEL LYMPH NODE Right 11/27/2023   Procedure: RIGHT BREAST LUMPECTOMY AFTER MAGNETIC SEED LOCALIZATION AND SENTINEL LYMPH NODE BIOPSY;  Surgeon: Belinda Cough, MD;  Location: [facility];  Service: General;  Laterality: Right;  GEN w/PEC  BLOCK RIGHT BREAST SEED LOCALIZED LUMPECTOMY WITH AXILLARY SENTINEL LYMPH NODE BIOPSY   ROTATOR CUFF REPAIR W/ DISTAL CLAVICLE EXCISION Left 08/18/2008   @[facility]  by dr amy   SHOULDER ARTHROSCOPY W/ LABRAL REPAIR Left 06/07/2008   @[facility] ;  and SAD  by dr amy   TONSILLECTOMY  1964   TUBAL LIGATION Bilateral 1999   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   Spondylolisthesis of lumbar region 04/29/2017   Spondylosis without myelopathy or radiculopathy, cervical region 04/29/2017   Pain from implanted hardware 04/04/2016   Obesity 03/19/2016   Primary osteoarthritis of first carpometacarpal joint of left hand 07/13/2015   History of TIA (transient ischemic attack) 03/14/2015   Metatarsalgia of right foot 03/08/2015   Routine general medical examination at a health care facility 11/23/2012   Essential hypertension 06/16/2008   Hyperlipidemia 12/29/2007    PCP:   REFERRING PROVIDER: Lauraine Golden, MD  REFERRING DIAG: s/p Right Breast Cancer  THERAPY DIAG:  Malignant neoplasm of upper-outer quadrant of right breast in female, estrogen receptor positive (HCC)  Abnormal posture  Chronic right shoulder pain  Swelling of breast  Lymphedema, not elsewhere classified  Rationale for Evaluation and Treatment: Rehabilitation  ONSET DATE: 01/06/2024 SUBJECTIVE:  SUBJECTIVE STATEMENT I've had a flare up. My Rt breast is firmer and I can feel a cord running down into my upper arm and into my breast.   05/04/2024 Re-EVAL I had the I and D and it was some better, but now I am being bothered by pain at the 5:00 position of my breast, not along the crease like the MD says. The MD said I don't need to wear my compression bra anymore. Pain in the axilla and breast is a 7/10. I can't raise my arm because the cording pulls down into the breast.  I am not having the swelling in the upper breast anymore, but now at 5:00 position I have swelling and firmness. It is tender and sore. I will get an US  on Oct 9.  PERTINENT HISTORY:  Patient was diagnosed on 10/24/2023 with right grade 2 invasive ductal carcinoma breast cancer. It measures 9 mm and is located in the upper outer quadrant. It is ER/PR positive and HER2 negative with a Ki67 of 20%. She had a Right lumpectomy with SLNB on 11/27/2023 with 0+/2 LN's. She developed a seroma in the axilla and lateral breast and had 160 c of fluid drained on 12/08/2023.  5 days later they drained and got 120 cc,She had another 10 cc drained on 12/22/2023. She is presently undergoing radiation.She has a history of endometrial cancer in 2022 and a history of a TIA  05/04/2024; pt is now post radiation, last seen in therapy on 02/16/24 and advised to see MD for rapid increase in swelling and fever. On 02/18/2024 had 60 cc aspirated from seroma. The seroma re-accumulated and was aspirated of 150 cc with US  guidance. In a week it had re-accumulated again and pt underwent I and D on 03/05/2024 and had a drain placed. Drain was removed on 04/06/2024. When MD saw pt pain had improved and wound was healed but she was feeling some firmness in the inferior breast that was going to be evaluated by US . She continues with complaints of cording in the  axilla/upper arm.  PATIENT GOALS:  Reassess how my recovery is going related to arm function, pain, and swelling.  PAIN:  Are you having pain? No   PRECAUTIONS: Recent Surgery, right UE Lymphedema risk, Prior TIA  RED FLAGS: None   ACTIVITY LEVEL / LEISURE: walking   OBJECTIVE:   PATIENT SURVEYS:  QUICK DASH: EVAL 31.82,  05/04/2024; 31.82 BREAST COMPLAINTS QUESTIONNAIRE (9/30) / 2nd column - 06/07/24 Pain:  7  2 Heaviness:  7  2 Swollen feeling: 7  4 Tense Skin:  7  4 Redness:  7  3 Bra Print:  0  1 Size of Pores:  3  1 Hard feeling:  7  6 Total:      45/80  23/80 A Score over 9 indicates lymphedema issues in the breast   OBSERVATIONS: EVAL: Large area of swelling with induration right upper breast.(05/04/2024)  Large indention noted at lateral breast (see photo in media) from where drain was in after I and D. Fibrosis noted inferior medial right breast with tenderness at lower breast above fibrosis  POSTURE:  Forward head and rounded shoulders posture    LYMPHEDEMA ASSESSMENT:   UPPER EXTREMITY AROM/PROM:   A/PROM RIGHT   eval   RIGHT 01/13/2024 RIGHT 05/04/2024  Shoulder extension 57 45 42  Shoulder flexion 160 140 125 with pulling  Shoulder abduction 169 115 tight under arm 90 with pulling  Shoulder internal rotation 67 45  Shoulder external rotation 82 60                           (Blank rows = not tested)   A/PROM LEFT   eval  Shoulder extension 48  Shoulder flexion 137  Shoulder abduction 168  Shoulder internal rotation 57  Shoulder external rotation 75                          (Blank rows = not tested)   CERVICAL AROM: All within normal limits   UPPER EXTREMITY STRENGTH: WNL   LYMPHEDEMA ASSESSMENTS (in cm):    LANDMARK RIGHT   eval RIGHT 01/13/2024  10 cm proximal to olecranon process 32.9 33.8  Olecranon process 25 24.7  10 cm proximal to ulnar styloid process 24.5 23.0  Just proximal to ulnar styloid process 16.9 17.4  Across hand  at thumb web space 19.5 19.6  At base of 2nd digit 6.6 6.3  (Blank rows = not tested)   LANDMARK LEFT   eval LEFT 01/13/2024  10 cm proximal to olecranon process 35 34.6  Olecranon process 25.3 25.5  10 cm proximal to ulnar styloid process 23 22.7  Just proximal to ulnar styloid process 16.4 16.5  Across hand at thumb web space 18.1 18.7  At base of 2nd digit 6.5 6.4  (Blank rows = not tested)    Surgery type/Date: right breast lumpectomy and sentinel lymph node biopsy on 11/27/2023.  Number of lymph nodes removed: 0+/2 Current/past treatment (chemo, radiation, hormone therapy): radiation Other symptoms:  Heaviness/tightness No Pain Yes Pitting edema No Infections No, 05/04/2024 Yes; left Breast treated for initially, then antibiotics held when aspirations occurred Decreased scar mobility Yes Stemmer sign No   TODAY'S TREATMENT: 06/29/24: Therapeutic Exercises Rolled yellow ball up wall into flex and abd x 10 each Therapeutic Activities Free Motion Machine for scapular retraction and extension with 7# 2 x 12 with rest break between sets Supine over half foam roll for following: Bil UE abd in a snow angel x 12 with 3-5 sec holds (pt reports large stretch felt at end motion today where new tightness palpable), then bil UE scaption in a V x 12 with long holds as well for increased stretch  Manual Therapy MLD to Rt breast as follows: Short neck, superficial and deep abdominals, Lt axilla and Rt inguinal nodes, anterior inter-axillary and Rt axillo-inguinal anastomosis, then focused on Rt breast redirecting towards anastomosis.  MFR  new cording in right axilla to incision and then into breast, most palpable in medial upper arm to elbow and multiple small pops noted during stretching PROM Right shoulder flexion, abduction, and D2 with scapular depression throughout all manual therapy  06/22/24: Therapeutic Exercises Rolled yellow ball up wall into flex x 10 and abd x 12 with  good stretch reported Therapeutic Activities Free Motion Machine for scapular retraction and extension with 7# 2 x 15 with rest break between sets Supine over half foam roll for following: Bil UE abd in a snow angel x 12 with 3-5 sec holds, then bil UE scaption in a V x 12 with long holds as well for increased stretch - pt not including Left shoulder today due to shoulder pain.   Manual Therapy MLD to Rt breast as follows: Short neck, breathing only due to diverticulitis pain, Lt axilla and Rt inguinal nodes, anterior inter-axillary Rt axillo-inguinal anastomosis then focused on Rt breast redirecting  towards anastomosis.  MFR for Cording release to right axilla to incision PROM Right shoulder flexion, abduction, and D2 with scapular depression throughout all manual therapy  06/15/24: Therapeutic Exercises Rolled yellow ball up wall into flex x 7 and abd x 12 with good stretch reported felt in cord in axilla, pt with slow and controlled motions for increased stretch Therapeutic Activities Free Motion Machine for postural strength with 7# 2 x 15 with rest break between sets Supine over half foam roll for following: Bil UE abd in a snow angel x 12 with 3-5 sec holds, then bil UE scaption in a V x 12 with long holds as well for increased stretch Manual Therapy Assessed Rt breast for any signs/symptoms of infection and also had Saddie Raw, PT assess pt as well with pts past medical history of seroma and cellulitis. She does not appear to have symptoms indicating a recurrence of either so continued with MLD of Rt breast. MLD to Rt breast as follows: Short neck, superficial and deep abdominals and spent time reviewing correct diaphragmatic breathing technique, Lt axilla and Rt inguinal nodes, anterior inter-axillary Rt axillo-inguinal anastomosis then focused on Rt breast redirecting towards anastomosis. Issued handout through email as pt reports she lost her first one.  MFR for Cording release to  right axilla to incision PROM Right shoulder flexion, abduction, and D2 with scapular depression throughout all manual therapy    PATIENT EDUCATION:  Education details: Supine scapular series with red theraband Person educated: Patient Education method: Programmer, Multimedia, Demonstration, Verbal cues, and Handouts Education comprehension: verbalized understanding, returned demonstration, and verbal cues required  HOME EXERCISE PROGRAM: Instructed 4 post op exercises with pt holding Right wrist with left hand for AA flexion in supine, supine stargazer, scapular retraction, wall slides. Showed wrist extension with elbow ext and gentle shoulder ext to stretch cords. Advised to do with appropriate tightness only and not to force. Should not have pain in shoulder. To perform 2x's per day. Also educated in scar massage to incision area, and advised to watch ABC video.   ASSESSMENT:  CLINICAL IMPRESSION:  Pt comes in reporting that up until last week she was ready to D/C today. However, over the weekend she had a flare up of Rt breast swelling and cording. Also her Quick DASH has worsened some from 31.82 up to 43.18 today due to new increased symptoms. She reports increased activity at home with decorating her house and lifting heavy boxes and then had to drive to [place] for work Thursday which is always very stressful for her. It's probably that the combination of activities caused this flare up. She will benefit from continuing physical therapy to decrease flare up of symptoms before D/C.   Pt will benefit from skilled therapeutic intervention to improve on the following deficits: Decreased knowledge of precautions, impaired UE functional use, pain, decreased ROM, postural dysfunction.   PT treatment/interventions: ADL/Self care home management, [phone]- PT Re-evaluation, [phone]- Physical Performance Testing, 97110-Therapeutic exercises, 97530- Therapeutic activity, [id number]- Neuromuscular re-education, 97535-  Self Care, 02859- Manual therapy, 97760- Orthotic Initial, [phone]- Orthotic/Prosthetic subsequent, and Patient/Family education   GOALS: Goals reviewed with patient? Yes  LONG TERM GOALS:  (STG=LTG)  GOALS Name Target Date  Goal status  1 Pt will demonstrate she has regained full shoulder ROM and function post operatively compared to baselines.  Baseline: 02/24/2024 INITIAL  2 Pt will be independent in Right shoulder ROM and strengthening 02/24/2024 06/15/2024 MET 06/09/2024  3 Pt will be independent in right breast  MLD to decrease breast swelling 02/24/2024 07/20/2024 PARTIALLY MET  4 Pt will be compliant with compression bra to decrease right breast swelling 02/24/2024 07/20/2024 MET  5. Quick dash will be no greater than 15% to demonstrate improved UE function  07/20/2024 ONGOING     PLAN:  PT FREQUENCY/DURATION: 2x/week x 6 weeks  PLAN FOR NEXT SESSION: Cont MFR to new area of cording; D/C once this has calmed down; Review breast MLD prn; Cont ball roll up wall if pt wants (stop pulleys due to no stretch felt and pt request) review supine scapular series and progress AA/A/ROM stretches   [facility]  [address], [address]  [place] [address]  [phone]   Aden Berwyn Caldron, PTA 06/29/2024, 10:26 AM   Cancer Rehab [phone] Self manual lymph drainage: Perform this sequence once a day.  Only give enough pressure no your skin to make the skin move.  Hug yourself.  Do circles at your neck just above your collarbones.  Repeat this 10 times.  Diaphragmatic - Supine   Inhale through nose making navel move out toward hands. Exhale through puckered lips, hands follow navel in. Repeat _5__ times. Rest _10__ seconds between repeats.    Axilla - One at a Time   Using full weight of flat hand and fingers at center of uninvolved armpit, make _10__ in-place circles.   Copyright  VHI. All rights reserved.  LEG: Inguinal Nodes  Stimulation   With small finger side of hand against hip crease on involved side, gently perform circles at the crease. Repeat __10_ times.   Copyright  VHI. All rights reserved.  Axilla to Inguinal Nodes - Sweep   On involved side, pump _4__ times from armpit along side of trunk to hip crease.  Now gently stretch skin from the involved side to the uninvolved side across the chest at the shoulder line.  Repeat that 4 times.  Draw an imaginary diagonal line from upper outer breast through the nipple area toward lower inner breast.  Direct fluid upward and inward from this line toward the pathway across your upper chest .  Do this in three rows to treat all of the upper inner breast tissue, and do each row 3-4x.      Direct fluid to treat all of lower outer breast tissue downward and outward toward  pathway that is aimed at the left groin. Easiest to reach side of breast in Lt S/L  Finish by doing the pathways as described above going from your involved armpit to the same side groin and going across your upper chest from the involved shoulder to the uninvolved shoulder.  Repeat the steps above where you do circles in your right groin and left armpit.

## 2024-07-08 ENCOUNTER — Telehealth (HOSPITAL_BASED_OUTPATIENT_CLINIC_OR_DEPARTMENT_OTHER): Payer: Self-pay

## 2024-07-08 NOTE — Telephone Encounter (Signed)
 On the clearance that sent to us , the procedure date was dated for 05/23/24. Seeing that the date has already passed I called the surgeons office to get an updated date for the procedure. Surgery scheduler was unavailable so a VM was left for them to call us  back.

## 2024-07-08 NOTE — Telephone Encounter (Signed)
   Pre-operative Risk Assessment    Patient Name: [name]  DOB: 1955-10-04 MRN: [id number]   Date of last office visit: 05/03/24 with Elaine Date of next office visit: 07/09/24 with Dr. Pietro  Request for Surgical Clearance    Procedure:  Colonoscopy  Date of Surgery:  Clearance TBD                                 Surgeon:  Dr. Kristie Socks Group or Practice Name:  [facility] Phone number:  [phone] Fax number:  [phone]   Type of Clearance Requested:   - Medical    Type of Anesthesia:  Propofol    Additional requests/questions:    SignedAugustin JONETTA Daring   07/08/2024, 3:19 PM

## 2024-07-09 ENCOUNTER — Ambulatory Visit: Attending: Cardiology | Admitting: Cardiology

## 2024-07-09 ENCOUNTER — Encounter: Payer: Self-pay | Admitting: Cardiology

## 2024-07-09 VITALS — BP 138/70 | HR 70 | Ht 63.0 in | Wt 192.0 lb

## 2024-07-09 DIAGNOSIS — E785 Hyperlipidemia, unspecified: Secondary | ICD-10-CM

## 2024-07-09 DIAGNOSIS — I10 Essential (primary) hypertension: Secondary | ICD-10-CM

## 2024-07-09 DIAGNOSIS — R55 Syncope and collapse: Secondary | ICD-10-CM | POA: Diagnosis not present

## 2024-07-09 NOTE — Patient Instructions (Signed)

## 2024-07-12 NOTE — Telephone Encounter (Signed)
 Clearance has been addressed and forward to requesting office

## 2024-07-12 NOTE — Telephone Encounter (Signed)
   Pre-operative Risk Assessment    Patient Name: [name]  DOB: 1956/07/28 MRN: [id number]   Date of last office visit: 07/09/24 with Pietro Date of next office visit: none  Request for Surgical Clearance    Procedure:  Colonoscopy   Date of Surgery:  Clearance 07/23/24 (new date)                                Surgeon:  Dr. Kristie Socks Group or Practice Name:  [facility], [place] Phone number:  [phone] Fax number:  [phone]   Type of Clearance Requested:   - Medical    Type of Anesthesia:  Propofol    Additional requests/questions:  None  SignedPatrcia Iverson CROME   07/12/2024, 9:37 AM

## 2024-07-12 NOTE — Telephone Encounter (Signed)
   Patient Name: [name]  DOB: [birth year] MRN: [id number]  Primary Cardiologist: Redell Shallow, MD  Chart reviewed as part of pre-operative protocol coverage. Pre-op clearance already addressed by colleagues in earlier phone notes. To summarize recommendations:  -Ok for colonoscopy Redell Shallow, MD  No medications indicated as needing held.   Will route this bundled recommendation to requesting provider via Epic fax function and remove from pre-op pool. Please call with questions.  Orren LOISE Fabry, PA-C 07/12/2024, 11:28 AM

## 2024-07-14 ENCOUNTER — Encounter: Payer: Self-pay | Admitting: Internal Medicine

## 2024-07-23 LAB — HM COLONOSCOPY

## 2024-11-24 ENCOUNTER — Inpatient Hospital Stay: Admitting: Adult Health

## 2024-11-24 ENCOUNTER — Inpatient Hospital Stay
